# Patient Record
Sex: Male | Born: 1980
Health system: Southern US, Community
[De-identification: ages and names within clinical notes are randomized; demographics above are authoritative.]

## PROBLEM LIST (undated history)

## (undated) ENCOUNTER — Ambulatory Visit: Source: Home / Self Care

## (undated) DIAGNOSIS — Z87442 Personal history of urinary calculi: Secondary | ICD-10-CM

## (undated) DIAGNOSIS — F32A Depression, unspecified: Secondary | ICD-10-CM

## (undated) DIAGNOSIS — G894 Chronic pain syndrome: Secondary | ICD-10-CM

## (undated) DIAGNOSIS — N390 Urinary tract infection, site not specified: Secondary | ICD-10-CM

## (undated) DIAGNOSIS — I10 Essential (primary) hypertension: Secondary | ICD-10-CM

## (undated) DIAGNOSIS — F419 Anxiety disorder, unspecified: Secondary | ICD-10-CM

## (undated) DIAGNOSIS — F329 Major depressive disorder, single episode, unspecified: Secondary | ICD-10-CM

## (undated) DIAGNOSIS — N189 Chronic kidney disease, unspecified: Secondary | ICD-10-CM

## (undated) DIAGNOSIS — K219 Gastro-esophageal reflux disease without esophagitis: Secondary | ICD-10-CM

## (undated) DIAGNOSIS — M1990 Unspecified osteoarthritis, unspecified site: Secondary | ICD-10-CM

## (undated) DIAGNOSIS — J45909 Unspecified asthma, uncomplicated: Secondary | ICD-10-CM

## (undated) DIAGNOSIS — G4730 Sleep apnea, unspecified: Secondary | ICD-10-CM

## (undated) DIAGNOSIS — R7303 Prediabetes: Secondary | ICD-10-CM

## (undated) HISTORY — PX: REPLACEMENT TOTAL KNEE: SUR1224

## (undated) HISTORY — DX: Urinary tract infection, site not specified: N39.0

## (undated) HISTORY — DX: Sleep apnea, unspecified: G47.30

## (undated) HISTORY — PX: KNEE SURGERY: SHX244

## (undated) HISTORY — PX: COLON SURGERY: SHX602

## (undated) HISTORY — DX: Gastro-esophageal reflux disease without esophagitis: K21.9

## (undated) HISTORY — PX: JOINT REPLACEMENT: SHX530

---

## 1999-01-12 ENCOUNTER — Encounter: Payer: Self-pay | Admitting: Emergency Medicine

## 1999-01-12 ENCOUNTER — Emergency Department (HOSPITAL_COMMUNITY): Admission: EM | Admit: 1999-01-12 | Discharge: 1999-01-12 | Payer: Self-pay | Admitting: Emergency Medicine

## 2000-11-29 ENCOUNTER — Emergency Department (HOSPITAL_COMMUNITY): Admission: EM | Admit: 2000-11-29 | Discharge: 2000-11-29 | Payer: Self-pay | Admitting: Internal Medicine

## 2000-12-14 ENCOUNTER — Ambulatory Visit (HOSPITAL_BASED_OUTPATIENT_CLINIC_OR_DEPARTMENT_OTHER): Admission: RE | Admit: 2000-12-14 | Discharge: 2000-12-15 | Payer: Self-pay | Admitting: Orthopedic Surgery

## 2002-03-12 ENCOUNTER — Emergency Department (HOSPITAL_COMMUNITY): Admission: EM | Admit: 2002-03-12 | Discharge: 2002-03-12 | Payer: Self-pay

## 2002-07-26 ENCOUNTER — Emergency Department (HOSPITAL_COMMUNITY): Admission: EM | Admit: 2002-07-26 | Discharge: 2002-07-26 | Payer: Self-pay | Admitting: *Deleted

## 2002-12-10 ENCOUNTER — Emergency Department (HOSPITAL_COMMUNITY): Admission: EM | Admit: 2002-12-10 | Discharge: 2002-12-10 | Payer: Self-pay | Admitting: Emergency Medicine

## 2004-03-10 ENCOUNTER — Emergency Department (HOSPITAL_COMMUNITY): Admission: EM | Admit: 2004-03-10 | Discharge: 2004-03-10 | Payer: Self-pay | Admitting: Emergency Medicine

## 2004-06-18 ENCOUNTER — Emergency Department (HOSPITAL_COMMUNITY): Admission: EM | Admit: 2004-06-18 | Discharge: 2004-06-18 | Payer: Self-pay | Admitting: Emergency Medicine

## 2005-04-08 ENCOUNTER — Ambulatory Visit (HOSPITAL_COMMUNITY): Admission: RE | Admit: 2005-04-08 | Discharge: 2005-04-08 | Payer: Self-pay | Admitting: Orthopedic Surgery

## 2005-09-14 ENCOUNTER — Ambulatory Visit (HOSPITAL_COMMUNITY): Admission: RE | Admit: 2005-09-14 | Discharge: 2005-09-14 | Payer: Self-pay | Admitting: Orthopedic Surgery

## 2005-10-15 ENCOUNTER — Encounter: Admission: RE | Admit: 2005-10-15 | Discharge: 2005-10-15 | Payer: Self-pay | Admitting: Orthopedic Surgery

## 2007-09-19 ENCOUNTER — Emergency Department (HOSPITAL_COMMUNITY): Admission: EM | Admit: 2007-09-19 | Discharge: 2007-09-19 | Payer: Self-pay | Admitting: Emergency Medicine

## 2008-04-08 ENCOUNTER — Emergency Department (HOSPITAL_COMMUNITY): Admission: EM | Admit: 2008-04-08 | Discharge: 2008-04-08 | Payer: Self-pay | Admitting: Emergency Medicine

## 2009-03-01 ENCOUNTER — Emergency Department (HOSPITAL_COMMUNITY): Admission: EM | Admit: 2009-03-01 | Discharge: 2009-03-01 | Payer: Self-pay | Admitting: Emergency Medicine

## 2009-09-13 ENCOUNTER — Emergency Department (HOSPITAL_COMMUNITY): Admission: EM | Admit: 2009-09-13 | Discharge: 2009-09-13 | Payer: Self-pay | Admitting: Emergency Medicine

## 2009-11-24 ENCOUNTER — Encounter (INDEPENDENT_AMBULATORY_CARE_PROVIDER_SITE_OTHER): Payer: Self-pay | Admitting: *Deleted

## 2009-12-17 ENCOUNTER — Ambulatory Visit: Payer: Self-pay | Admitting: Family

## 2010-01-21 ENCOUNTER — Ambulatory Visit: Payer: Self-pay | Admitting: Family

## 2010-01-21 DIAGNOSIS — M1990 Unspecified osteoarthritis, unspecified site: Secondary | ICD-10-CM | POA: Insufficient documentation

## 2010-01-21 DIAGNOSIS — F419 Anxiety disorder, unspecified: Secondary | ICD-10-CM | POA: Insufficient documentation

## 2010-01-21 DIAGNOSIS — I10 Essential (primary) hypertension: Secondary | ICD-10-CM | POA: Insufficient documentation

## 2010-01-21 DIAGNOSIS — F329 Major depressive disorder, single episode, unspecified: Secondary | ICD-10-CM | POA: Insufficient documentation

## 2010-01-21 DIAGNOSIS — G894 Chronic pain syndrome: Secondary | ICD-10-CM | POA: Insufficient documentation

## 2010-02-03 ENCOUNTER — Ambulatory Visit: Payer: Self-pay | Admitting: Family

## 2010-02-03 LAB — CONVERTED CEMR LAB
ALT: 29 units/L (ref 0–53)
AST: 33 units/L (ref 0–37)
Alkaline Phosphatase: 79 units/L (ref 39–117)
Basophils Relative: 0.4 % (ref 0.0–3.0)
Bilirubin, Direct: 0 mg/dL (ref 0.0–0.3)
CO2: 30 meq/L (ref 19–32)
Calcium: 9.3 mg/dL (ref 8.4–10.5)
Chloride: 108 meq/L (ref 96–112)
Cholesterol: 174 mg/dL (ref 0–200)
Eosinophils Absolute: 0.2 10*3/uL (ref 0.0–0.7)
HCT: 41.7 % (ref 39.0–52.0)
HDL: 53.1 mg/dL (ref >39.00)
Hemoglobin: 13.2 g/dL (ref 13.0–17.0)
Lymphocytes Relative: 49.7 % — ABNORMAL HIGH (ref 12.0–46.0)
MCHC: 31.7 g/dL (ref 30.0–36.0)
MCV: 84.5 fL (ref 78.0–100.0)
Monocytes Absolute: 0.6 10*3/uL (ref 0.1–1.0)
Neutrophils Relative %: 38 % — ABNORMAL LOW (ref 43.0–77.0)
RDW: 13.7 % (ref 11.5–14.6)
Sodium: 142 meq/L (ref 135–145)
Total Bilirubin: 0.4 mg/dL (ref 0.3–1.2)
Total CHOL/HDL Ratio: 3
WBC: 6.5 10*3/uL (ref 4.5–10.5)

## 2010-02-04 ENCOUNTER — Encounter: Payer: Self-pay | Admitting: Family

## 2010-02-09 ENCOUNTER — Telehealth: Payer: Self-pay | Admitting: Family

## 2010-02-10 ENCOUNTER — Telehealth: Payer: Self-pay | Admitting: Family

## 2010-02-27 ENCOUNTER — Encounter
Admission: RE | Admit: 2010-02-27 | Discharge: 2010-05-28 | Payer: Self-pay | Admitting: Physical Medicine & Rehabilitation

## 2010-03-03 ENCOUNTER — Ambulatory Visit: Payer: Self-pay | Admitting: Physical Medicine & Rehabilitation

## 2010-03-17 ENCOUNTER — Telehealth: Payer: Self-pay | Admitting: Family

## 2010-03-20 ENCOUNTER — Ambulatory Visit: Payer: Self-pay | Admitting: Physical Medicine & Rehabilitation

## 2010-04-21 ENCOUNTER — Ambulatory Visit: Payer: Self-pay | Admitting: Physical Medicine & Rehabilitation

## 2010-05-19 ENCOUNTER — Encounter
Admission: RE | Admit: 2010-05-19 | Discharge: 2010-08-17 | Payer: Self-pay | Admitting: Physical Medicine & Rehabilitation

## 2010-05-22 ENCOUNTER — Ambulatory Visit: Payer: Self-pay | Admitting: Physical Medicine & Rehabilitation

## 2010-06-16 ENCOUNTER — Ambulatory Visit: Payer: Self-pay | Admitting: Physical Medicine & Rehabilitation

## 2010-06-18 ENCOUNTER — Ambulatory Visit (HOSPITAL_COMMUNITY): Admission: RE | Admit: 2010-06-18 | Discharge: 2010-06-18 | Payer: Self-pay | Admitting: Orthopedic Surgery

## 2010-06-23 ENCOUNTER — Ambulatory Visit: Payer: Self-pay | Admitting: Physical Medicine & Rehabilitation

## 2010-06-30 ENCOUNTER — Ambulatory Visit: Payer: Self-pay | Admitting: Physical Medicine & Rehabilitation

## 2010-07-13 ENCOUNTER — Ambulatory Visit: Payer: Self-pay | Admitting: Physical Medicine & Rehabilitation

## 2010-07-20 ENCOUNTER — Ambulatory Visit: Payer: Self-pay | Admitting: Physical Medicine & Rehabilitation

## 2010-08-12 ENCOUNTER — Encounter
Admission: RE | Admit: 2010-08-12 | Discharge: 2010-11-10 | Payer: Self-pay | Admitting: Physical Medicine & Rehabilitation

## 2010-08-20 ENCOUNTER — Ambulatory Visit: Payer: Self-pay | Admitting: Physical Medicine & Rehabilitation

## 2010-09-18 ENCOUNTER — Ambulatory Visit: Payer: Self-pay | Admitting: Physical Medicine & Rehabilitation

## 2010-10-20 ENCOUNTER — Ambulatory Visit: Payer: Self-pay | Admitting: Physical Medicine & Rehabilitation

## 2010-12-09 ENCOUNTER — Encounter
Admission: RE | Admit: 2010-12-09 | Discharge: 2010-12-15 | Payer: Self-pay | Source: Home / Self Care | Attending: Physical Medicine & Rehabilitation | Admitting: Physical Medicine & Rehabilitation

## 2010-12-15 ENCOUNTER — Ambulatory Visit: Payer: Self-pay | Admitting: Physical Medicine & Rehabilitation

## 2011-01-28 NOTE — Assessment & Plan Note (Signed)
Summary: NEW PT TO ESTAB///SPH   Vital Signs:  Patient profile:   30 year old male Height:      66.25 inches Weight:      250.6 pounds BMI:     40.29 Pulse rate:   78 / minute BP sitting:   136 / 80  (left arm)  Vitals Entered By: Doristine Devoid (January 21, 2010 10:13 AM) CC: NEW EST- needs refill on meds until he gets in w/ pain management Dr.    CC:  NEW EST- needs refill on meds until he gets in w/ pain management Dr. .  History of Present Illness: Mr Branch is a 30 year old male who presents to establish care today.  Notes that he has not had a primary care provider since he was a child.    OA- had Fork lift injury with ACL demage.  Has had multiple orthopedic procedures and PT.  Still has severe pain despite TKR in March 2010.    Obesity- weighed 308 at one time.    Depression- this is new for patient since his injury. Has struggled with his change in lifestyle and inability to work.    HTN- not currently treated  Preventative- Up date on Tetanus and Flu.  Exercises regularly.  Notes that he has lost over 50 lbs in 2006. Notes diet consists of fresh fruits/vegetables- eats small portion.     Allergies (verified): 1)  ! Thalia Party  Past History:  Past Medical History: Osteoarthritis Depression  Past Surgical History: ACL repair right knee 2001- [facility] ACL repair right knee 3/06 Dr. Brynda Greathouse,  [facility] ACL revision right knee 10/06 Dr Brynda Greathouse Femur oscotomy 9/07 Dr. Annamaria Helling at [facility]  Removal of Hardware Dr [facility] 3/10 Right TKR 04/29/09  Past History:  Care Management: Orthopedics:Dr. Everardo Beals- [facility]  Family History: CAD-father deceased MI age 43 paternal grandfather deceased MI age 32 HTN-father DM-father COLON CA-no PROSTATE CA-paternal uncle STROKE-paternal grandmother deceased 83 Family History of Alcoholism/Addiction  Mom- Depression Dad- MI at age 26, hyperlipidemia, HTN, DM, angina 2 sisters- both healthy 1 brother-  healthy  Social History: quit smoking 10 years ago Denies ETOH married 1 son age 41 + history of marijuana use- none currently completed 2 years of college unemployed due to injury, currently on disability  Review of Systems       Constitutional: Denies Fever ENT:  Denies nasal congestion or sore throat. Resp: Denies cough CV:  Denies Chest Pain or DOE GI:  Denies nausea or vomitting GU: Denies dysuria Lymphatic: Denies lymphadenopathy Musculoskeletal:  chronic right knee pain Skin:  Denies Rashes Psychiatric: + depression notes lack of interest in things that he used to enjoy.  Large crowds of people/driving make him nervous. +panic attacks frequently when in public places.  Denies suicide ideation Neuro: notes + numbness in toes since injury,  arms fall asleep at night.       Physical Exam  General:  Overweight AA male in NAD Head:  [place]/AT, some male pattern baldness is noted Eyes:  No corneal or conjunctival inflammation noted. EOMI. Perrla. Funduscopic exam benign, without hemorrhages, exudates or papilledema. Vision grossly normal. Ears:  External ear exam shows no significant lesions or deformities.  Otoscopic examination reveals clear canals, tympanic membranes are intact bilaterally without bulging, retraction, inflammation or discharge. Hearing is grossly normal bilaterally. Mouth:  Oral mucosa and oropharynx without lesions or exudates.  Teeth in good repair. Neck:  No deformities, masses, or tenderness noted. Lungs:  Normal respiratory  effort, chest expands symmetrically. Lungs are clear to auscultation, no crackles or wheezes. Heart:  Normal rate and regular rhythm. S1 and S2 normal without gallop, murmur, click, rub or other extra sounds. Abdomen:  Bowel sounds positive,abdomen soft and non-tender without masses, organomegaly or hernias noted. Msk:  + tenderness with mild swelling of right knee.   Extremities:  No clubbing, cyanosis Neurologic:  decreased strenth  RLE due to knee pain alert & oriented X3.   walks with walker Skin:  Intact without suspicious lesions or rashes Cervical Nodes:  No lymphadenopathy noted Psych:  Cognition and judgment appear intact. Alert and cooperative with normal attention span and concentration. No apparent delusions, illusions, hallucinations   Impression & Recommendations:  Problem # 1:  Preventive Health Care (ICD-V70.0) Will plan to have patient return for fasting blood work.  Immunizations reviewed, patient counselled on diet/exercise and weight loss.  Problem # 2:  DEPRESSION (ICD-311) has tried cymbalta felt "trapped inside myself" Notes that he uses valium for muscle spasms and trouble sleeping.  He was seeing Dr. Vear Clock but was discharged due to marijuana use.  He declines medications at this time.  His updated medication list for this problem includes:    Valium 5 Mg Tabs (Diazepam) .Marland Kitchen... Take one q 6 hours daily  Problem # 3:  HYPERTENSION, MILD (ICD-401.1) Assessment: New  Advised patient on low sodium diet.  Plan to repeat BP next visit and will make determination of need for BP med at that time.    BP today: 136/80  Problem # 4:  CORONARY ARTERY DISEASE, PREMATURE, FAMILY HX (ICD-V17.3) Will plan to check FLP- needs baseline EKG next visit as well.  Will plan to aim for LDL less than 100 given patient's family hx of dad dying at 79 of MI.    Problem # 5:  CHRONIC PAIN SYNDROME (ICD-338.4) Assessment: Unchanged Patient is in the process of trying to get into a new pain management practice.  Tells me that his insurance has declined to authorize visits to the provider to whom he has been referred.  He is in the process of appealing.  Orthopedist is going to cover pain management until mid February.  Patient asks me if I can manage pain temporarily if he is not yet in with pain management in Mid- February.  I told him that I would do this only on a temporary basis and that this is best managed by a  pain specialist.  He verbalized understanding.    Problem # 6:  OSTEOARTHRITIS (ICD-715.90) Assessment: Deteriorated Management per orthopedics.   His updated medication list for this problem includes:    Oxycodone-acetaminophen 10-325 Mg Tabs (Oxycodone-acetaminophen) .Marland Kitchen... Take one tablet q 4 hours  Complete Medication List: 1)  Limbrel 500 Mg Caps (Flavocoxid) .... Take two times a day 2)  Valium 5 Mg Tabs (Diazepam) .... Take one q 6 hours daily 3)  Metanx 3-35-2 Mg Tabs (L-methylfolate-b6-b12) .... Two times a day 4)  Oxycodone-acetaminophen 10-325 Mg Tabs (Oxycodone-acetaminophen) .... Take one tablet q 4 hours 5)  Flexeril  .... Dose unknown takes daily  Patient Instructions: 1)  Please return fasting for the following blood tests- 2)  CBC, LFT's,FLP  TSH, BMET- V70 3)  Please schedule a follow-up appointment in 1 month. 4)  You need to lose weight. Consider a lower calorie diet and regular exercise.  5)  Limit your Sodium (Salt).

## 2011-01-28 NOTE — Progress Notes (Signed)
Summary: PAIN CLINIC REFERRAL  Phone Note Other Incoming   Summary of Call: In reference to 02-09-2010 Pain clinic referral, last phone notes states patient has already been referred to one.  (We show no insurance in our system). I called patient to clarify, and they state he does have Medicare insurance.  Also they state he has already been referred to [facility], and are waiting on response to see if they will accept him as a patient.  They also inform me that he has hx w/[facility].  All pain clinics require previous records for review, and I do not see that we have any.  Do we just wait then on the response from pain clinic he is already referred to?  Initial call taken by: Magdalen Spatz [facility],  February 10, 2010 12:09 PM  Follow-up for Phone Call        It is my understanding that he has been discharged from [facility] and current pain management referral has declined him.  Please verify this with patient. If he has in fact been declined, then could we please try another pain management please. Thanks. Follow-up by: Lemont Fillers FNP,  February 10, 2010 12:14 PM  Additional Follow-up for Phone Call Additional follow up Details #1::        I just spoke with his wife shortly ago, and states they are awaiting response from [facility] to see if patient will be accepted.    Magdalen Spatz [facility]  February 10, 2010 12:51 PM

## 2011-01-28 NOTE — Letter (Signed)
   [facility] [address] [place], [place] [address] [phone]    February 04, 2010   [facility] [address] [place], [place] [address]  RE:  LAB RESULTS  Dear  Mr. Dowlen,  The following is an interpretation of your most recent lab tests.  Please take note of any instructions provided or changes to medications that have resulted from your lab work.  ELECTROLYTES:  Good - no changes needed  KIDNEY FUNCTION TESTS:  Good - no changes needed  LIPID PANEL:  Good - no changes needed Triglyceride: 69.0   Cholesterol: 174   LDL: 107   HDL: 53.10   Chol/HDL%:  3  THYROID STUDIES:  Thyroid studies normal TSH: 3.91     DIABETIC STUDIES:  Excellent - no changes needed Blood Glucose: 79    CBC:  Good - no changes needed   Sincerely Yours,    Lemont Fillers FNP

## 2011-01-28 NOTE — Progress Notes (Signed)
Summary: Re; Pain Med  Phone Note Call from Patient Call back at Home Phone [phone]   Caller: Spouse Summary of Call: wife called says husband is having a adverse reaction to med [employer]  (says  brand new med),  ie; sob,dizziness,  jumpy vision. Wife says they did let Dr Fritzi Mandes nurse know,  she did recommend him to take patch off and she called in some hydrocodone which is not helping with pain.  wife says she feels pain Doc not doing his job and   wants to speak with Jordan [name] who she says  was told if  he has any problems to give a call Initial call taken by: Kandice Hams,  March 17, 2010 12:29 PM  Follow-up for Phone Call        Called patient.  He tells me he saw Dr Larna Daughters once in early march.  Was placed on patch (butran) Had allergic reaction.  Was switched to hydrocodone.  Feels like his pain medicine has been dropped too quickly.  I told pt that I am unable to prescribe narcotics given that he is being seen by pain management.  I encouraged patient to communicate his concerns with Dr Trecia Rogers nurse and try to arrange earlier follow up.  Encouraged pt to give it some more time as he has only seen Dr Larna Daughters once.  If he continues to be unsatisfied down the road, we can consider referral to a different pain management center, however I instructed pt to continue to work with Dr. Larna Daughters regardless until we have him scheduled to see another provider as I will not be providing his pain management. Pt verbalizes understanding. Follow-up by: Lemont Fillers FNP,  March 17, 2010 1:40 PM   New Allergies: ! BUTRANS (BUPRENORPHINE) New Allergies: ! BUTRANS (BUPRENORPHINE)

## 2011-01-28 NOTE — Progress Notes (Signed)
  Phone Note Outgoing Call   Call placed by: Lemont Fillers FNP,  February 09, 2010 9:38 AM Summary of Call: Called patient, he tells me his insurance has still not approved current pain clinic referral.  Will have our referral coordinator try to assist with a different pain clinic.  Patient aware.   Initial call taken by: Lemont Fillers FNP,  February 09, 2010 9:39 AM

## 2011-03-19 ENCOUNTER — Encounter: Payer: Worker's Compensation | Attending: Physical Medicine & Rehabilitation

## 2011-03-19 ENCOUNTER — Ambulatory Visit: Payer: Worker's Compensation | Admitting: Physical Medicine & Rehabilitation

## 2011-03-19 DIAGNOSIS — G8928 Other chronic postprocedural pain: Secondary | ICD-10-CM

## 2011-03-19 DIAGNOSIS — M25579 Pain in unspecified ankle and joints of unspecified foot: Secondary | ICD-10-CM | POA: Insufficient documentation

## 2011-03-19 DIAGNOSIS — M25569 Pain in unspecified knee: Secondary | ICD-10-CM | POA: Insufficient documentation

## 2011-03-19 DIAGNOSIS — R209 Unspecified disturbances of skin sensation: Secondary | ICD-10-CM | POA: Insufficient documentation

## 2011-03-19 DIAGNOSIS — F341 Dysthymic disorder: Secondary | ICD-10-CM | POA: Insufficient documentation

## 2011-03-19 DIAGNOSIS — Z79899 Other long term (current) drug therapy: Secondary | ICD-10-CM | POA: Insufficient documentation

## 2011-03-19 DIAGNOSIS — M62838 Other muscle spasm: Secondary | ICD-10-CM | POA: Insufficient documentation

## 2011-03-19 DIAGNOSIS — K5900 Constipation, unspecified: Secondary | ICD-10-CM | POA: Insufficient documentation

## 2011-04-02 LAB — CBC
Hemoglobin: 15 g/dL (ref 13.0–17.0)
MCHC: 33.1 g/dL (ref 30.0–36.0)
RDW: 15.5 % (ref 11.5–15.5)
WBC: 10 10*3/uL (ref 4.0–10.5)

## 2011-04-02 LAB — DIFFERENTIAL
Lymphocytes Relative: 22 % (ref 12–46)
Lymphs Abs: 2.2 10*3/uL (ref 0.7–4.0)
Monocytes Relative: 6 % (ref 3–12)
Neutro Abs: 7 10*3/uL (ref 1.7–7.7)
Neutrophils Relative %: 70 % (ref 43–77)

## 2011-04-02 LAB — COMPREHENSIVE METABOLIC PANEL
ALT: 27 U/L (ref 0–53)
BUN: 4 mg/dL — ABNORMAL LOW (ref 6–23)
CO2: 24 mEq/L (ref 19–32)
Calcium: 9.9 mg/dL (ref 8.4–10.5)
Creatinine, Ser: 0.86 mg/dL (ref 0.4–1.5)
GFR calc non Af Amer: >60 mL/min (ref >60)
Glucose, Bld: 102 mg/dL — ABNORMAL HIGH (ref 70–99)
Sodium: 141 mEq/L (ref 135–145)
Total Protein: 8 g/dL (ref 6.0–8.3)

## 2011-04-02 LAB — LIPASE, BLOOD: Lipase: 25 U/L (ref 11–59)

## 2011-05-14 NOTE — Op Note (Signed)
[place]  Patient:    Jordan [name], Jordan [name]                 MRN: [id number] Proc. Date: 12/14/00 Adm. Date:  [id number] Attending:  Alinda Deem                           Operative Report  PREOPERATIVE DIAGNOSIS:  Right knee medial meniscal tear with anterior cruciate ligament tear.  POSTOPERATIVE DIAGNOSIS:  Right knee medial meniscal tear with anterior cruciate ligament tear.  PROCEDURE:  Right knee partial arthroscopic medial meniscectomy and anterior cruciate ligament reconstruction using a 10 mm wide bone-tendon-bone autograft, with an 8 x 20 Linvatek BioScrew on the femur and a 9 x 20 Linvatek BioScrew on the tibia.  SURGEON:  Alinda Deem, M.D.  FIRST ASSISTANT:  Dorthula Matas, P.A.-C.  ANESTHESIA:  General endotracheal.  ESTIMATED BLOOD LOSS:  Minimal.  FLUID REPLACEMENT:  1 L crystalloid.  DRAINS PLACED:  None.  TOURNIQUET TIME:  None.  PERIOPERATIVE ANTIBIOTICS:  500 mg of vancomycin.  INDICATIONS FOR PROCEDURE:  A 30 year old man who presented to my office about a week ago with an acute injury to his right knee, which is most likely either a bucket-handle or a parrot-beak tear of the medial meniscus.  It was preventing full range of motion and preventing him from working properly.  He put up with these symptoms for a few weeks and because of pain and disability, he came in for evaluation.  We also noted that he had an ACL tear that may have been chronic in nature, relating to an injury that occurred a few years ago.  Because of the chronic instability he has had for the last couple of years and the need to repair or remove the torn meniscus, arthroscopic evaluation and treatment of these two injuries was recommended and consented to by the patient.  DESCRIPTION OF PROCEDURE:  The patient identified by arm band and taken to the operating room at [facility], appropriate anesthetic  monitors were attached, and general endotracheal anesthesia induced with the patient in the supine position.  The right lower extremity was then prepped and draped in the usual sterile fashion from the ankle to the mid-thigh.  Lateral post applied to the table as well as the Stulberg Mark II knee positioning device. The inferolateral and inferomedial peripatellar regions were then infiltrated with 2-3 cc of 0.5% Marcaine and epinephrine solution.  The arthroscope was introduced through the inferolateral portal with the outflow through the inferomedial portal.  Diagnostic arthroscopy revealed the patella, trochlea, and retinacular tissues to be in good condition.  On the medial side, the patient had a complex parrot-beak/bucket-handle tear of the medial meniscus, which was removed with a 4.2 mm barracuda sucker-shaver as well as the grasping biters.  Moving into the notch, the ACL was found to be torn off of the femur.  This appeared to be a somewhat chronic injury, and the stump was debrided and a superior lateral notchplasty of about 1 mm was performed.  The lateral meniscus and lateral articular cartilages were pristine.  Satisfied with the stump removal and the notchplasty, the knee was held in 45 degrees of flexion and an anterior incision starting a centimeter medial to the tibial tubercle was made to the region of the midpatella through the skin and subcutaneous tissue and cutting through the retinaculum over the patellar tendon, which was measured  to 30 mm.  A standard bone-tendon-bone autograft was then harvested with 10 mm wide bone plugs, each bone plug about 20 mm in length.  This was taken to the back table and prepared.  A single 20-gauge wire was passed through the tibial bone plug for traction once the graft was inserted.  The arthroscope was reinserted in the knee and using the Linvatek tibial pin guide set at 55 degrees, the guide pin was placed 1.5 cm medial to the tibial  tubercle up to the posterior aspect of the ACL footprint on the tibia.  This was over-reamed with a 9 mm reamer.  The 7 mm over-the-top guide was placed through the tibial tunnel, and a guide pin was placed on the femur. This guide pin was over-reamed with the 9 mm reamer as well to a depth of 35 mm, and then a superior notch placed in the femoral tunnel and a lateral notch in the tibial tunnel.  The graft, which was prepared at the back table, was then threaded through the tibial tunnel and into the femoral tunnel with a good fit.  The knee was flexed to 95 degrees, and an 8 x 20 BioScrew was used to lock in the patellar bone plug in the femoral tunnel with a good squeak obtained.  The knee was taken through a range of motion, no impingement noted. A 90 degree twist was placed in the graft, which was then tensioned and the knee flexed at 45 degrees with a reverse Lachman maneuver, and the tibial bone plug was locked into the tibial tunnel using a 9 x 20 Linvatek BioScrew.  The knee was taken through a range of motion.  The graft was assessed for tension, which was appropriate.  No impingement was noted.  Lachman test was negative under arthroscopic visualization.  The knee was then washed out with normal saline solution, the arthroscopic instruments removed.  Small bits of bone that were taken from the autograft were placed in the trough in the patella and the tibia.  The patellar retinaculum was closed with running 3-0 Vicryl suture, as was the subcutaneous tissue.  The skin was closed with running subcuticular 4-0 Monocryl suture.  A dressing of Xeroform, followed by a dressing sponge, Webril, and Ace wrap, and a Cryocuff was placed in the operating room.  The patient was awakened and taken to the recovery room and placed in the CPM machine with overnight observation. DD:  12/14/00 TD:  12/15/00 Job: [id number]

## 2011-06-10 ENCOUNTER — Encounter: Payer: Worker's Compensation | Admitting: Neurosurgery

## 2011-06-10 DIAGNOSIS — F341 Dysthymic disorder: Secondary | ICD-10-CM | POA: Insufficient documentation

## 2011-06-10 DIAGNOSIS — M25569 Pain in unspecified knee: Secondary | ICD-10-CM | POA: Insufficient documentation

## 2011-06-10 DIAGNOSIS — Z79899 Other long term (current) drug therapy: Secondary | ICD-10-CM | POA: Insufficient documentation

## 2011-06-10 DIAGNOSIS — M25579 Pain in unspecified ankle and joints of unspecified foot: Secondary | ICD-10-CM | POA: Insufficient documentation

## 2011-06-10 DIAGNOSIS — M62838 Other muscle spasm: Secondary | ICD-10-CM | POA: Insufficient documentation

## 2011-06-10 DIAGNOSIS — R209 Unspecified disturbances of skin sensation: Secondary | ICD-10-CM | POA: Insufficient documentation

## 2011-06-10 DIAGNOSIS — K5900 Constipation, unspecified: Secondary | ICD-10-CM | POA: Insufficient documentation

## 2011-07-13 ENCOUNTER — Encounter: Payer: Worker's Compensation | Attending: Neurosurgery | Admitting: Neurosurgery

## 2011-07-13 DIAGNOSIS — M25579 Pain in unspecified ankle and joints of unspecified foot: Secondary | ICD-10-CM | POA: Insufficient documentation

## 2011-07-13 DIAGNOSIS — G8928 Other chronic postprocedural pain: Secondary | ICD-10-CM | POA: Insufficient documentation

## 2011-07-13 DIAGNOSIS — G8921 Chronic pain due to trauma: Secondary | ICD-10-CM | POA: Insufficient documentation

## 2011-07-13 DIAGNOSIS — M25569 Pain in unspecified knee: Secondary | ICD-10-CM

## 2011-07-14 NOTE — Assessment & Plan Note (Signed)
HISTORY OF PRESENT ILLNESS:  This is a patient of Dr. Wynn [name] is seen for chronic right knee and ankle pain.  He had a workers' comp injury of Biochemist, clinical followed by right knee arthroplasty at [facility].  He states he has had problems with pain ever since and multiple surgeries.  He rates his pain at 8-9.  It is a sharp pain that is constant with some paresthesia like activity.  General activity level is 8-9.  Pain is same 24 hours a day.  It is worse with walking, bending, standing, and activity.  Rest, heat, and medication tend to help.  He uses a cane or walker.  He has a rolling walker with him today.  He does drive.  He does not climb steps.  He can walk about 5 minutes.  REVIEW OF SYSTEMS:  Notable for those difficulties described above, otherwise within normal limits.  PAST MEDICAL HISTORY:  Unchanged.  He does see Dr. Everardo [name], orthopedist at [facility].  He did not see Dr. Darrick [name] that Dr. Wynn [name] referred him to.  He states that he will follow up with his doctor at [facility].  SOCIAL HISTORY:  Unchanged.  FAMILY HISTORY:  Unchanged.  PHYSICAL EXAMINATION:  VITAL SIGNS:  His blood pressure 155/84, pulse 88, respirations 20, and O2 sats 99 on room air. NEUROLOGIC:  His motor strength is about 4/5 in lower extremities. Sensation is intact.  Constitutionally, he is obese.  He is alert and oriented x3.  He is again using a rolling walker today.  IMPRESSION:  Chronic pain, right knee and ankle, post-injury, post- surgery.  PLAN:  He will continue his current medication regimen.  Somehow, he has a refill on his hydrocodone.  He will follow up here in the clinic in 1 month.  He has refills on his other medicines without change.  His questions were encouraged and answered.     Jordan [name] L. [name] Electronically Signed    RLW/MedQ D:  07/13/2011 11:40:17  T:  07/14/2011 00:58:56  Job #:  [id number]

## 2011-10-08 ENCOUNTER — Ambulatory Visit: Payer: Worker's Compensation | Admitting: Physical Medicine & Rehabilitation

## 2011-10-08 ENCOUNTER — Encounter: Payer: Worker's Compensation | Attending: Neurosurgery

## 2011-10-08 DIAGNOSIS — M25579 Pain in unspecified ankle and joints of unspecified foot: Secondary | ICD-10-CM | POA: Insufficient documentation

## 2011-10-08 DIAGNOSIS — G8921 Chronic pain due to trauma: Secondary | ICD-10-CM | POA: Insufficient documentation

## 2011-10-08 DIAGNOSIS — G8928 Other chronic postprocedural pain: Secondary | ICD-10-CM

## 2011-10-08 DIAGNOSIS — M25569 Pain in unspecified knee: Secondary | ICD-10-CM | POA: Insufficient documentation

## 2011-10-08 DIAGNOSIS — G894 Chronic pain syndrome: Secondary | ICD-10-CM

## 2011-10-08 NOTE — Assessment & Plan Note (Signed)
This is a patient of Dr. Wynn Banker seen for chronic right knee pain and ankle pain.  He has an old ACL work comp injury and he is treated at [facility].  The patient states his pain is about 8 or 9.  It is a sharp, burning, dull, stabbing, tingling, aching pain.  It is constant. General activity level is 9-10.  Pain is same 24 hours a day.  Sleep patterns are poor.  All activities aggravate.  Rest, heat, medication tend to help.  He walks with a cane or walker.  He does drive.  He does not climb steps.  He can only walk about 5 minutes at a time.  At times he uses a wheelchair.  He is on disability.  REVIEW OF SYSTEMS:  Notable for difficulties described above as well as some weakness, numbness, tingling, trouble walking, spasms, dizziness, depression, anxiety.  No suicidal thoughts or aberrant behaviors.  Pill counts were correct.  UDS was collected today.  PAST MEDICAL HISTORY/SOCIAL HISTORY/FAMILY HISTORY:  Unchanged.  PHYSICAL EXAMINATION:  Blood pressure 170/85, pulse 89, respirations 16, O2 sats 100 on room air.  His motor strength is good in his lower extremities.  His sensation is intact.  Constitutionally, he is mildly obese.  He is alert and oriented x3.  He walks with a cane.  He has stated that he would like come down on his hydrocodone and keep weaning himself down in dose.  I think it is admirable.  IMPRESSION:  Chronic pain, right knee and ankle post surgery.  PLAN:  Refill hydrocodone 7.5/325, 1 p.o. t.i.d. to q.i.d.  He is to keep at 120 for now.  No refill.  We will see him back in a month.     Jahzeel Poythress L. [name] Electronically Signed    RLW/MedQ D:  10/08/2011 14:01:02  T:  10/08/2011 19:39:09  Job #:  [id number]

## 2011-10-15 ENCOUNTER — Ambulatory Visit: Payer: Self-pay | Admitting: Physical Medicine & Rehabilitation

## 2011-12-31 ENCOUNTER — Ambulatory Visit: Payer: Self-pay | Admitting: Neurosurgery

## 2012-02-25 ENCOUNTER — Encounter: Payer: Self-pay | Admitting: Family

## 2012-02-25 ENCOUNTER — Ambulatory Visit (INDEPENDENT_AMBULATORY_CARE_PROVIDER_SITE_OTHER): Payer: Worker's Compensation | Admitting: Family

## 2012-02-25 DIAGNOSIS — Z0000 Encounter for general adult medical examination without abnormal findings: Secondary | ICD-10-CM

## 2012-02-25 DIAGNOSIS — F341 Dysthymic disorder: Secondary | ICD-10-CM

## 2012-02-25 DIAGNOSIS — F419 Anxiety disorder, unspecified: Secondary | ICD-10-CM

## 2012-02-25 DIAGNOSIS — G894 Chronic pain syndrome: Secondary | ICD-10-CM

## 2012-02-25 DIAGNOSIS — I10 Essential (primary) hypertension: Secondary | ICD-10-CM

## 2012-02-25 DIAGNOSIS — F329 Major depressive disorder, single episode, unspecified: Secondary | ICD-10-CM

## 2012-02-25 MED ORDER — L-METHYLFOLATE-B6-B12 2.8-25-2 MG PO TABS: 1.0000 | ORAL_TABLET | Freq: Two times a day (BID) | ORAL | Status: DC

## 2012-02-25 MED ORDER — FLAVOCOXID 500 MG PO CAPS: 500.0000 mg | ORAL_CAPSULE | Freq: Two times a day (BID) | ORAL | Status: DC

## 2012-02-25 NOTE — Assessment & Plan Note (Signed)
Pt counseled on weight loss, continued exercise, portion control.  Due for fasting  Lab work (he will return next week).  Tdap given today. Up to date on flu shot.

## 2012-02-25 NOTE — Progress Notes (Signed)
Subjective:    Patient ID: Jordan [name], male    DOB: October 23, 1981, 31 y.o.   MRN: [id number]  HPI  Jordan [name] is a 31 yr old male who presents today with his wife for complete physical.  He has not been seen since 2011.    Preventative Care- Not fasting- pt had breakfast at 7AM today.  Reports that weight has been stable.  Not able to exercise-  [place]. Rides a bike.  Does step ups/downs.  Diet is improving.  Needs to work on portion control. Due for tetanus.  Had flu shot in October.    Pain- reports that this is an off of pain meds.  Pain persists- has decided that he would rather live with the pain, than deal with side effects of pain medication.   HTN- BP is a hair elevated today.  Depression- reports good mood. Notes some anxiety with crowds. He is following with Dr. Gibson Ramp- psychology.  Declines medication. Has been on cymbalta in the past and didn't like the way that it made him feel.     Review of Systems  Constitutional: Negative for unexpected weight change.  HENT: Positive for congestion.   Eyes: Negative for visual disturbance.  Respiratory: Negative for cough.   Cardiovascular: Negative for palpitations.  Gastrointestinal: Negative for nausea, vomiting and diarrhea.  Genitourinary: Negative for dysuria, frequency and hematuria.       Polyuria/polydipsia  Musculoskeletal: Positive for arthralgias.  Neurological: Negative for headaches.  Hematological: Negative for adenopathy.  Psychiatric/Behavioral:       See hpi   No past medical history on file.  History   Social History  . Marital Status: Married    Spouse Name: N/A    Number of Children: 1  . Years of Education: N/A   Occupational History  .     Social History Main Topics  . Smoking status: Former Smoker -- 2 years    Types: Cigarettes  . Smokeless tobacco: Not on file  . Alcohol Use: Yes     occasional  . Drug Use: Not on file  . Sexually Active: Not on file    Other Topics Concern  . Not on file   Social History Narrative   Caffeine Use:  6 beverages dailyRegular exercise:  2 x weekly    No past surgical history on file.  No family history on file.  Allergies  Allergen Reactions  . Butrans (Buprenorphine Hcl)     Difficulty breathing, n/v  . Nucynta (Tapentadol Hydrochloride) Anaphylaxis    No current outpatient prescriptions on file prior to visit.    BP 144/92  Pulse 82  Temp(Src) 98 F (36.7 C) (Oral)  Resp 16  Ht 5\' 7"  (1.702 m)  Wt 281 lb (127.461 kg)  BMI 44.01 kg/m2  SpO2 98%   Physical Exam  Constitutional: He is oriented to person, place, and time. He appears well-developed and well-nourished. No distress.  HENT:  Head: Normocephalic and atraumatic.  Right Ear: Tympanic membrane and ear canal normal.  Left Ear: Tympanic membrane and ear canal normal.  Mouth/Throat: Oropharynx is clear and moist.  Eyes: Pupils are equal, round, and reactive to light. No scleral icterus.  Neck: Normal range of motion. No thyromegaly present.  Cardiovascular: Normal rate and regular rhythm.   No murmur heard. Pulmonary/Chest: Effort normal and breath sounds normal. No respiratory distress. He has no wheezes. He has no rales. He exhibits no tenderness.  Abdominal:  Soft. Bowel sounds are normal. He exhibits no distension and no mass. There is no tenderness. There is no rebound and no guarding.  Musculoskeletal: He exhibits no edema.  Lymphadenopathy:    He has no cervical adenopathy.  Neurological: He is alert and oriented to person, place, and time. He has normal reflexes. He exhibits normal muscle tone. Coordination normal.  Skin: Skin is warm and dry.  Psychiatric: He has a normal mood and affect. His behavior is normal. Judgment and thought content normal.          Assessment & Plan:        Objective:   Physical Exam        Assessment & Plan:

## 2012-02-25 NOTE — Patient Instructions (Addendum)
Please return to the lab fasting on Tuesday of next week. Keep up the good work with exercise/healthy eating/portion control. Please follow up in 6 months for a blood pressure check, sooner as needed.

## 2012-02-25 NOTE — Assessment & Plan Note (Signed)
BP Readings from Last 3 Encounters:  02/25/12 144/92  01/21/10 136/80  Mild elevation today, plan to repeat in 6 months.  Pt reports "white coat htn."

## 2012-02-25 NOTE — Assessment & Plan Note (Signed)
Unchanged, currently using dietary supplements only.

## 2012-02-25 NOTE — Assessment & Plan Note (Signed)
He declines medication, but continue to follow with a therapist.

## 2012-02-28 ENCOUNTER — Telehealth: Payer: Self-pay | Admitting: *Deleted

## 2012-02-28 NOTE — Telephone Encounter (Signed)
Received fax from [facility] that Metanx is the covered equivalent to L-Methylfolate-B6-B12  2.8-25-2 and they want to know if they can dispense Metanx? Per Sandford Craze, NP ok to change. Notified pharmacist and verified that Metanx is a slightly different strength than the L-Methylfolate. Medication list updated to reflect this change.

## 2012-03-02 ENCOUNTER — Encounter: Payer: Self-pay | Admitting: Family

## 2012-09-01 ENCOUNTER — Ambulatory Visit: Payer: Self-pay | Admitting: Family

## 2013-04-03 ENCOUNTER — Other Ambulatory Visit: Payer: Self-pay | Admitting: Family

## 2013-04-03 NOTE — Telephone Encounter (Signed)
Limbrel request [Last Rx 03.01.2013 #60x11]; Last OV 03.01.2013, no future appt scheduled/SLS Please advise.

## 2013-04-04 NOTE — Telephone Encounter (Signed)
Can send 30 tabs no refills.  Needs follow up visit.

## 2013-04-04 NOTE — Telephone Encounter (Signed)
Rx request to pharmacy; *PATIENT NEEDS OFFICE VISIT PRIOR TO FUTURE REFILLS*/SLS     

## 2013-06-11 ENCOUNTER — Emergency Department (HOSPITAL_COMMUNITY)
Admission: EM | Admit: 2013-06-11 | Discharge: 2013-06-11 | Disposition: A | Discharge status: Home / Self Care | Payer: Medicare Other | Attending: Emergency Medicine | Admitting: Emergency Medicine

## 2013-06-11 ENCOUNTER — Encounter (HOSPITAL_COMMUNITY): Payer: Self-pay

## 2013-06-11 DIAGNOSIS — Z87891 Personal history of nicotine dependence: Secondary | ICD-10-CM | POA: Insufficient documentation

## 2013-06-11 DIAGNOSIS — R0602 Shortness of breath: Secondary | ICD-10-CM | POA: Insufficient documentation

## 2013-06-11 DIAGNOSIS — R21 Rash and other nonspecific skin eruption: Secondary | ICD-10-CM | POA: Insufficient documentation

## 2013-06-11 DIAGNOSIS — T7840XA Allergy, unspecified, initial encounter: Secondary | ICD-10-CM

## 2013-06-11 LAB — BASIC METABOLIC PANEL
CO2: 22 mEq/L (ref 19–32)
Calcium: 9.2 mg/dL (ref 8.4–10.5)
Chloride: 100 mEq/L (ref 96–112)
Creatinine, Ser: 0.98 mg/dL (ref 0.50–1.35)
Glucose, Bld: 107 mg/dL — ABNORMAL HIGH (ref 70–99)
Sodium: 133 mEq/L — ABNORMAL LOW (ref 135–145)

## 2013-06-11 LAB — CBC WITH DIFFERENTIAL/PLATELET
Basophils Absolute: 0 10*3/uL (ref 0.0–0.1)
Eosinophils Relative: 0 % (ref 0–5)
HCT: 46.4 % (ref 39.0–52.0)
Lymphocytes Relative: 21 % (ref 12–46)
Lymphs Abs: 2.1 10*3/uL (ref 0.7–4.0)
MCV: 80.7 fL (ref 78.0–100.0)
Monocytes Absolute: 0.4 10*3/uL (ref 0.1–1.0)
Neutro Abs: 7.4 10*3/uL (ref 1.7–7.7)
RBC: 5.75 MIL/uL (ref 4.22–5.81)
RDW: 14.3 % (ref 11.5–15.5)
WBC: 9.9 10*3/uL (ref 4.0–10.5)

## 2013-06-11 MED ORDER — METHYLPREDNISOLONE SODIUM SUCC 125 MG IJ SOLR
125.0000 mg | Freq: Once | INTRAMUSCULAR | Status: AC
  Administered 2013-06-11: 125 mg via INTRAVENOUS
  Filled 2013-06-11: qty 2

## 2013-06-11 MED ORDER — EPINEPHRINE 0.3 MG/0.3ML IJ SOAJ: 0.3000 mg | INTRAMUSCULAR | Status: DC | PRN

## 2013-06-11 MED ORDER — DIPHENHYDRAMINE HCL 50 MG/ML IJ SOLN
25.0000 mg | Freq: Once | INTRAMUSCULAR | Status: AC
  Administered 2013-06-11: 25 mg via INTRAVENOUS
  Filled 2013-06-11: qty 1

## 2013-06-11 MED ORDER — PREDNISONE 20 MG PO TABS: 60.0000 mg | ORAL_TABLET | Freq: Every day | ORAL | Status: DC

## 2013-06-11 MED ORDER — FAMOTIDINE IN NACL 20-0.9 MG/50ML-% IV SOLN
20.0000 mg | Freq: Once | INTRAVENOUS | Status: AC
  Administered 2013-06-11: 20 mg via INTRAVENOUS
  Filled 2013-06-11: qty 50

## 2013-06-11 MED ORDER — FAMOTIDINE 20 MG PO TABS: 20.0000 mg | ORAL_TABLET | Freq: Two times a day (BID) | ORAL | Status: DC

## 2013-06-11 MED ORDER — DIPHENHYDRAMINE HCL 25 MG PO TABS: 25.0000 mg | ORAL_TABLET | Freq: Four times a day (QID) | ORAL | Status: DC

## 2013-06-11 NOTE — ED Provider Notes (Signed)
History    CSN: [id number] Arrival date & time 06/11/13  1233 First MD Initiated Contact with Patient 06/11/13 1404      Chief Complaint  Patient presents with  . Rash  . Shortness of Breath    HPI The patient was mowing his yard 2 days ago. Yesterday he woke up and had a rash on the back of his head, his forearms.  Today he woke up and no rashes over entire body including his groin. As the day progressed the patient or having trouble with his breathing. He felt like his throat was swelling. Patient decided to come to the emergency room for further evaluation. He denies any chest pain. He denies any abdominal pain.  The patient knows that he has poison sumac in his yard. He states he has had trouble in the past. History reviewed. No pertinent past medical history.  History reviewed. No pertinent past surgical history.  History reviewed. No pertinent family history.  History  Substance Use Topics  . Smoking status: Former Smoker -- 2 years    Types: Cigarettes  . Smokeless tobacco: Never Used  . Alcohol Use: Yes     Comment: occasional      Review of Systems  All other systems reviewed and are negative.    Allergies  Butrans and Nucynta  Home Medications   Current Outpatient Rx  Name  Route  Sig  Dispense  Refill  . aspirin-sod bicarb-citric acid (ALKA-SELTZER) 325 MG TBEF   Oral   Take 325 mg by mouth every 6 (six) hours as needed.         . calcium carbonate (TUMS - DOSED IN MG ELEMENTAL CALCIUM) 500 MG chewable tablet   Oral   Chew 1 tablet by mouth daily as needed for heartburn.         . Flavocoxid-Cit Zn Bisglcinate (LIMBREL500) 500-50 MG CAPS   Oral   Take 1 capsule by mouth 2 (two) times daily.         Marland Kitchen L-Methylfolate-B6-B12 (METANX PO)   Oral   Take 1 tablet by mouth 2 (two) times daily.         . diphenhydrAMINE (BENADRYL) 25 MG tablet   Oral   Take 1 tablet (25 mg total) by mouth every 6 (six) hours.   20 tablet   0   . EPINEPHrine  (EPIPEN) 0.3 mg/0.3 mL DEVI   Intramuscular   Inject 0.3 mLs (0.3 mg total) into the muscle as needed (for  life-threatening allergic reaction).   1 Device   `   . famotidine (PEPCID) 20 MG tablet   Oral   Take 1 tablet (20 mg total) by mouth 2 (two) times daily.   14 tablet   0   . predniSONE (DELTASONE) 20 MG tablet   Oral   Take 3 tablets (60 mg total) by mouth daily.   15 tablet   0     BP 140/98  Pulse 97  Temp(Src) 97.8 F (36.6 C) (Oral)  Resp 25  Ht 5\' 6"  (1.676 m)  Wt 265 lb (120.203 kg)  BMI 42.79 kg/m2  SpO2 99%  Physical Exam  Nursing note and vitals reviewed. Constitutional: He appears well-developed and well-nourished. No distress.  HENT:  Head: Normocephalic and atraumatic.  Right Ear: External ear normal.  Left Ear: External ear normal.  Mouth/Throat: No oropharyngeal exudate.  No uvula edema, no stridor, normal speech  Eyes: Conjunctivae are normal. Right eye exhibits no discharge. Left eye exhibits  no discharge. No scleral icterus.  Neck: Neck supple. No tracheal deviation present.  Cardiovascular: Normal rate, regular rhythm and intact distal pulses.   Pulmonary/Chest: Effort normal and breath sounds normal. No stridor. No respiratory distress. He has no wheezes. He has no rales.  Abdominal: Soft. Bowel sounds are normal. He exhibits no distension. There is no tenderness. There is no rebound and no guarding.  Musculoskeletal: He exhibits no edema and no tenderness.  Neurological: He is alert. He has normal strength. No sensory deficit. Cranial nerve deficit:  no gross defecits noted. He exhibits normal muscle tone. He displays no seizure activity. Coordination normal.  Skin: Skin is warm and dry. Rash noted.   Erythematous rash on his bilateral upper extremities and torso, no discrete urticarial lesions  Psychiatric: He has a normal mood and affect.    ED Course  Procedures (including critical care time) Medications  methylPREDNISolone sodium  succinate (SOLU-MEDROL) 125 mg/2 mL injection 125 mg (125 mg Intravenous Given 06/11/13 1440)  diphenhydrAMINE (BENADRYL) injection 25 mg (25 mg Intravenous Given 06/11/13 1441)  famotidine (PEPCID) IVPB 20 mg (20 mg Intravenous New Bag/Given 06/11/13 1440)    Labs Reviewed  BASIC METABOLIC PANEL - Abnormal; Notable for the following:    Sodium 133 (*)    Glucose, Bld 107 (*)    BUN 4 (*)    All other components within normal limits  CBC WITH DIFFERENTIAL   No results found.   1. Allergic reaction, initial encounter       MDM  Patient was treated in emergent medications for allergic reaction. Patient did have systemic symptoms but objectively doesn't appear to have evidence of anaphylaxis.  Unless, he will be discharged home on a course of steroids and antihistamines. I will also give him an EpiPen..  I will discharge him home with recommendations to followup with an allergist.       Celene Kras, MD 06/11/13 [phone]

## 2013-06-11 NOTE — ED Notes (Signed)
Patient reports that he was mowing his yard 2 days ago and woke yesterday with rash on back of head and forearms. Today he awakes with rash on entire body, feels like throat swelling  and having SOB.

## 2013-06-21 ENCOUNTER — Other Ambulatory Visit: Payer: Self-pay | Admitting: Family

## 2013-06-22 NOTE — Telephone Encounter (Signed)
Pt last seen 02/25/12 and has no future appts on file. Please advise.

## 2013-06-25 NOTE — Telephone Encounter (Signed)
Request for refill on: Limbrel Last Rx: 04.08.14, #30x0 w/note PATIENT MUST HAVE OFFICE VISIT FOR FURTHER REFILLS* Last OV: 03.01.13 Return: 6-Months Please advise on refills/SLS

## 2013-06-25 NOTE — Telephone Encounter (Signed)
Please call pt and let him know he is due for follow up prior to additional refills.

## 2013-06-25 NOTE — Telephone Encounter (Signed)
Needs OV prior to additional refills please.  

## 2013-06-26 NOTE — Telephone Encounter (Signed)
LMOM with contact name and number [for return call] RE: Needs OV prior to future refills per provider /SLS

## 2013-12-02 ENCOUNTER — Emergency Department (HOSPITAL_BASED_OUTPATIENT_CLINIC_OR_DEPARTMENT_OTHER)
Admission: EM | Admit: 2013-12-02 | Discharge: 2013-12-02 | Disposition: A | Discharge status: Home / Self Care | Payer: No Typology Code available for payment source | Attending: Emergency Medicine | Admitting: Emergency Medicine

## 2013-12-02 ENCOUNTER — Emergency Department (HOSPITAL_BASED_OUTPATIENT_CLINIC_OR_DEPARTMENT_OTHER): Payer: No Typology Code available for payment source

## 2013-12-02 ENCOUNTER — Encounter (HOSPITAL_BASED_OUTPATIENT_CLINIC_OR_DEPARTMENT_OTHER): Payer: Self-pay | Admitting: Emergency Medicine

## 2013-12-02 DIAGNOSIS — Z87891 Personal history of nicotine dependence: Secondary | ICD-10-CM | POA: Insufficient documentation

## 2013-12-02 DIAGNOSIS — Y9389 Activity, other specified: Secondary | ICD-10-CM | POA: Insufficient documentation

## 2013-12-02 DIAGNOSIS — Z79899 Other long term (current) drug therapy: Secondary | ICD-10-CM | POA: Insufficient documentation

## 2013-12-02 DIAGNOSIS — Y92410 Unspecified street and highway as the place of occurrence of the external cause: Secondary | ICD-10-CM | POA: Insufficient documentation

## 2013-12-02 DIAGNOSIS — S239XXA Sprain of unspecified parts of thorax, initial encounter: Secondary | ICD-10-CM | POA: Insufficient documentation

## 2013-12-02 DIAGNOSIS — S29019A Strain of muscle and tendon of unspecified wall of thorax, initial encounter: Secondary | ICD-10-CM

## 2013-12-02 DIAGNOSIS — IMO0002 Reserved for concepts with insufficient information to code with codable children: Secondary | ICD-10-CM | POA: Insufficient documentation

## 2013-12-02 MED ORDER — MELOXICAM 7.5 MG PO TABS: 7.5000 mg | ORAL_TABLET | Freq: Every day | ORAL | Status: DC

## 2013-12-02 MED ORDER — CYCLOBENZAPRINE HCL 10 MG PO TABS: 10.0000 mg | ORAL_TABLET | Freq: Two times a day (BID) | ORAL | Status: DC | PRN

## 2013-12-02 MED ORDER — IBUPROFEN 800 MG PO TABS
800.0000 mg | ORAL_TABLET | Freq: Once | ORAL | Status: AC
  Administered 2013-12-02: 800 mg via ORAL
  Filled 2013-12-02: qty 1
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
## 2013-12-03 NOTE — ED Provider Notes (Signed)
Medical screening examination/treatment/procedure(s) were performed by non-physician practitioner and as supervising physician I was immediately available for consultation/collaboration.  EKG Interpretation   None         Audree Camel, MD 12/03/13 1259

## 2014-01-16 ENCOUNTER — Telehealth: Payer: Self-pay | Admitting: Family

## 2014-01-16 ENCOUNTER — Telehealth: Payer: Self-pay | Admitting: *Deleted

## 2014-01-16 NOTE — Telephone Encounter (Signed)
Received call from pt's wife stating pt had episode of chest pressure and left arm numbness 3 days last week. Has not had any further episodes this week and wanted to be seen in our office for assessment of chest pain.  Spoke with pt, he denies current symptoms but reports that his dad had heart disease and had heart attack in his 2730's. Advised pt to proceed to the ER for further assessment. If he is cleared from cardiac standpoint advised pt we will see him in the office for follow up and further workup. Pt voices understanding.

## 2014-01-16 NOTE — Telephone Encounter (Signed)
Attempted to call back twice; left one voice mail and it was full then left office number for callback.  [name]/ spouse called reporting chest pain.  Call had been screened and emergent symptoms ruled out prior to attempting to call back.

## 2014-01-17 ENCOUNTER — Emergency Department (HOSPITAL_BASED_OUTPATIENT_CLINIC_OR_DEPARTMENT_OTHER): Payer: Medicare HMO

## 2014-01-17 ENCOUNTER — Encounter (HOSPITAL_BASED_OUTPATIENT_CLINIC_OR_DEPARTMENT_OTHER): Payer: Self-pay | Admitting: Emergency Medicine

## 2014-01-17 ENCOUNTER — Emergency Department (HOSPITAL_BASED_OUTPATIENT_CLINIC_OR_DEPARTMENT_OTHER)
Admission: EM | Admit: 2014-01-17 | Discharge: 2014-01-17 | Disposition: A | Discharge status: Home / Self Care | Payer: Medicare HMO | Attending: Emergency Medicine | Admitting: Emergency Medicine

## 2014-01-17 ENCOUNTER — Encounter: Payer: Self-pay | Admitting: Cardiovascular Disease

## 2014-01-17 DIAGNOSIS — Z7982 Long term (current) use of aspirin: Secondary | ICD-10-CM | POA: Insufficient documentation

## 2014-01-17 DIAGNOSIS — Z79899 Other long term (current) drug therapy: Secondary | ICD-10-CM | POA: Insufficient documentation

## 2014-01-17 DIAGNOSIS — R0789 Other chest pain: Secondary | ICD-10-CM | POA: Insufficient documentation

## 2014-01-17 DIAGNOSIS — R110 Nausea: Secondary | ICD-10-CM | POA: Insufficient documentation

## 2014-01-17 DIAGNOSIS — R079 Chest pain, unspecified: Secondary | ICD-10-CM

## 2014-01-17 DIAGNOSIS — IMO0002 Reserved for concepts with insufficient information to code with codable children: Secondary | ICD-10-CM | POA: Insufficient documentation

## 2014-01-17 DIAGNOSIS — Z791 Long term (current) use of non-steroidal anti-inflammatories (NSAID): Secondary | ICD-10-CM | POA: Insufficient documentation

## 2014-01-17 DIAGNOSIS — R0602 Shortness of breath: Secondary | ICD-10-CM | POA: Insufficient documentation

## 2014-01-17 DIAGNOSIS — F17200 Nicotine dependence, unspecified, uncomplicated: Secondary | ICD-10-CM | POA: Insufficient documentation

## 2014-01-17 HISTORY — DX: Anxiety disorder, unspecified: F41.9

## 2014-01-17 HISTORY — DX: Essential (primary) hypertension: I10

## 2014-01-17 HISTORY — DX: Chronic pain syndrome: G89.4

## 2014-01-17 HISTORY — DX: Depression, unspecified: F32.A

## 2014-01-17 HISTORY — DX: Unspecified osteoarthritis, unspecified site: M19.90

## 2014-01-17 HISTORY — DX: Major depressive disorder, single episode, unspecified: F32.9

## 2014-01-17 LAB — COMPREHENSIVE METABOLIC PANEL
ALT: 14 U/L (ref 0–53)
AST: 17 U/L (ref 0–37)
Albumin: 3.6 g/dL (ref 3.5–5.2)
Alkaline Phosphatase: 76 U/L (ref 39–117)
BUN: 4 mg/dL — ABNORMAL LOW (ref 6–23)
CALCIUM: 9.2 mg/dL (ref 8.4–10.5)
CO2: 26 meq/L (ref 19–32)
Chloride: 103 mEq/L (ref 96–112)
Creatinine, Ser: 0.8 mg/dL (ref 0.50–1.35)
GFR calc Af Amer: >90 mL/min (ref >90)
GFR calc non Af Amer: >90 mL/min (ref >90)
GLUCOSE: 89 mg/dL (ref 70–99)
Potassium: 4.3 mEq/L (ref 3.7–5.3)
Sodium: 142 mEq/L (ref 137–147)
TOTAL PROTEIN: 7 g/dL (ref 6.0–8.3)
Total Bilirubin: 0.3 mg/dL (ref 0.3–1.2)

## 2014-01-17 LAB — CBC WITH DIFFERENTIAL/PLATELET
BAND NEUTROPHILS: 0 % (ref 0–10)
BASOS ABS: 0 10*3/uL (ref 0.0–0.1)
BASOS PCT: 0 % (ref 0–1)
BLASTS: 0 %
Eosinophils Absolute: 0.1 10*3/uL (ref 0.0–0.7)
Eosinophils Relative: 1 % (ref 0–5)
HEMATOCRIT: 41.8 % (ref 39.0–52.0)
HEMOGLOBIN: 13.5 g/dL (ref 13.0–17.0)
LYMPHS ABS: 3.3 10*3/uL (ref 0.7–4.0)
Lymphocytes Relative: 47 % — ABNORMAL HIGH (ref 12–46)
MCH: 26.4 pg (ref 26.0–34.0)
MCHC: 32.3 g/dL (ref 30.0–36.0)
MCV: 81.6 fL (ref 78.0–100.0)
METAMYELOCYTES PCT: 0 %
MYELOCYTES: 0 %
Monocytes Absolute: 0.4 10*3/uL (ref 0.1–1.0)
Monocytes Relative: 6 % (ref 3–12)
Neutro Abs: 3.3 10*3/uL (ref 1.7–7.7)
Neutrophils Relative %: 46 % (ref 43–77)
Platelets: 248 10*3/uL (ref 150–400)
Promyelocytes Absolute: 0 %
RBC: 5.12 MIL/uL (ref 4.22–5.81)
RDW: 14.6 % (ref 11.5–15.5)
WBC: 7.1 10*3/uL (ref 4.0–10.5)
nRBC: 0 /100 WBC

## 2014-01-17 LAB — TROPONIN I: Troponin I: <0.3 ng/mL (ref <0.30)

## 2014-01-17 NOTE — ED Notes (Signed)
Pt amb to room 12 with quick steady gait in nad. Pt reports "chest pains all my life off and on" more frequent over the last year. Pt states yesterday he had a feeling "like someone is sitting on my chest" and sharp, stabbing chest pains radiating down his left arm with sob and nausea. Pt states he could not seek treatment yesterday because he had to care for his son. Pain resolved yesterday, pt states he has had no pain today at all. Pt called his pcp this am and was told to come to ed for eval.

## 2014-01-17 NOTE — ED Provider Notes (Addendum)
CSN: [id number]     Arrival date & time 01/17/14  [id number] History   First MD Initiated Contact with Patient 01/17/14 [phone]     Chief Complaint  Patient presents with  . Chest Pain    HPI . Pt reports "chest pains all my life off and on" more frequent over the last year. Pt states yesterday he had a feeling "like someone is sitting on my chest" and sharp, stabbing chest pains radiating down his left arm with sob and nausea. Pt states he could not seek treatment yesterday because he had to care for his son. Pain resolved yesterday, pt states he has had no pain today at all. Pt called his pcp this am and was told to come to ed for eval.  History reviewed. No pertinent past medical history. Past Surgical History  Procedure Laterality Date  . Knee surgery Right    History reviewed. No pertinent family history. History  Substance Use Topics  . Smoking status: Current Every Day Smoker -- 2 years    Types: Cigarettes  . Smokeless tobacco: Never Used  . Alcohol Use: Yes     Comment: occasional    Review of Systems  All other systems reviewed and are negative.    Allergies  Butrans; Nucynta; and Sumac  Home Medications   Current Outpatient Rx  Name  Route  Sig  Dispense  Refill  . aspirin 81 MG tablet   Oral   Take 81 mg by mouth daily.         Marland Kitchen. aspirin-sod bicarb-citric acid (ALKA-SELTZER) 325 MG TBEF   Oral   Take 325 mg by mouth every 6 (six) hours as needed.         . calcium carbonate (TUMS - DOSED IN MG ELEMENTAL CALCIUM) 500 MG chewable tablet   Oral   Chew 1 tablet by mouth daily as needed for heartburn.         . cyclobenzaprine (FLEXERIL) 10 MG tablet   Oral   Take 1 tablet (10 mg total) by mouth 2 (two) times daily as needed for muscle spasms.   20 tablet   0   . diphenhydrAMINE (BENADRYL) 25 MG tablet   Oral   Take 1 tablet (25 mg total) by mouth every 6 (six) hours.   20 tablet   0   . famotidine (PEPCID) 20 MG tablet   Oral   Take 1 tablet (20 mg  total) by mouth 2 (two) times daily.   14 tablet   0   . Flavocoxid-Cit Zn Bisglcinate (LIMBREL500) 500-50 MG CAPS   Oral   Take 1 capsule by mouth 2 (two) times daily.         Marland Kitchen. L-Methylfolate-B6-B12 (METANX PO)   Oral   Take 1 tablet by mouth 2 (two) times daily.         . meloxicam (MOBIC) 7.5 MG tablet   Oral   Take 1 tablet (7.5 mg total) by mouth daily.   10 tablet   0   . predniSONE (DELTASONE) 20 MG tablet   Oral   Take 3 tablets (60 mg total) by mouth daily.   15 tablet   0    BP 137/93  Pulse 84  Temp(Src) 98.2 F (36.8 C) (Oral)  Resp 18  SpO2 100% Physical Exam  Nursing note and vitals reviewed. Constitutional: He is oriented to person, place, and time. He appears well-developed and well-nourished. No distress.  HENT:  Head: Normocephalic and atraumatic.  Eyes: Pupils are equal, round, and reactive to light.  Neck: Normal range of motion.  Cardiovascular: Normal rate and intact distal pulses.  Exam reveals no gallop and no friction rub.   No murmur heard. Pulmonary/Chest: No respiratory distress.  Abdominal: Normal appearance. He exhibits no distension.  Musculoskeletal: Normal range of motion.  Neurological: He is alert and oriented to person, place, and time. No cranial nerve deficit.  Skin: Skin is warm and dry. No rash noted.  Psychiatric: He has a normal mood and affect. His behavior is normal.    ED Course  Procedures (including critical care time) Labs Review Labs Reviewed  COMPREHENSIVE METABOLIC PANEL - Abnormal; Notable for the following:    BUN 4 (*)    All other components within normal limits  CBC WITH DIFFERENTIAL - Abnormal; Notable for the following:    Lymphocytes Relative 47 (*)    All other components within normal limits  TROPONIN I   Imaging Review Dg Chest 2 View  01/17/2014   CLINICAL DATA:  Left chest pain, numbness, nausea  EXAM: CHEST  2 VIEW  COMPARISON:  12/02/2013 thoracic spine exam     IMPRESSION: No  active cardiopulmonary disease.   Electronically Signed   By: Ruel Favors M.D.   On: 01/17/2014 10:57    EKG Interpretation    Date/Time:  Thursday January 17 2014 09:33:59 EST Ventricular Rate:  89 PR Interval:  144 QRS Duration: 98 QT Interval:  374 QTC Calculation: 455 R Axis:   6 Text Interpretation:  Normal sinus rhythm Incomplete right bundle branch block Cannot rule out Anterior infarct , age undetermined Abnormal ECG Confirmed by Georgann Bramble  MD, Mariacristina Aday ([id number]) on 01/17/2014 9:41:43 AM           Heart score = 2 MDM   1. Chest pain     Low-risk patient is stable at time of discharge. We'll encourage to use daily aspirin and have set up followup appointment with cardiology on Monday.   Nelia Shi, MD 01/17/14 1136  Nelia Shi, MD 01/17/14 [phone]

## 2014-01-17 NOTE — Discharge Instructions (Signed)
Aspirin and Your Heart °Aspirin affects the way your blood clots and helps "thin" the blood. Aspirin has many uses in heart disease. It may be used as a primary prevention to help reduce the risk of heart related events. It also can be used as a secondary measure to prevent more heart attacks or to prevent additional damage from blood clots.  °ASPIRIN MAY HELP IF YOU: °· Have had a heart attack or chest pain. °· Have undergone open heart surgery such as CABG (Coronary Artery Bypass Surgery). °· Have had coronary angioplasty with or without stents. °· Have experienced a stroke or TIA (transient ischemic attack). °· Have peripheral vascular disease (PAD). °· Have chronic heart rhythm problems such as atrial fibrillation. °· Are at risk for heart disease. °BEFORE STARTING ASPIRIN °Before you start taking aspirin, your caregiver will need to review your medical history. Many things will need to be taken into consideration, such as: °· Smoking status. °· Blood pressure. °· Diabetes. °· Gender. °· Weight. °· Cholesterol level. °ASPIRIN DOSES °· Aspirin should only be taken on the advice of your caregiver. Talk to your caregiver about how much aspirin you should take. Aspirin comes in different doses such as: °· 81 mg. °· 162 mg. °· 325 mg. °· The aspirin dose you take may be affected by many factors, some of which include: °· Your current medications, especially if your are taking blood-thinners or anti-platelet medicine. °· Liver function. °· Heart disease risk. °· Age. °· Aspirin comes in two forms: °· Non-enteric-coated. This type of aspirin does not have a coating and is absorbed faster. Non-enteric coated aspirin is recommended for patients experiencing chest pain symptoms. This type of aspirin also comes in a chewable form. °· Enteric-coated. This means the aspirin has a special coating that releases the medicine very slowly. Enteric-coated aspirin causes less stomach upset. This type of aspirin should not be chewed  or crushed. °ASPIRIN SIDE EFFECTS °Daily use of aspirin can increase your risk of serious side effects. Some of these include: °· Increased bleeding. This can range from a cut that does not stop bleeding to more serious problems such as stomach bleeding or bleeding into the brain (Intracerebral bleeding). °· Increased bruising. °· Stomach upset. °· An allergic reaction such as red, itchy skin. °· Increased risk of bleeding when combined with non-steroidal anti-inflammatory medicine (NSAIDS). °· Alcohol should be drank in moderation when taking aspirin. Alcohol can increase the risk of stomach bleeding when taken with aspirin. °· Aspirin should not be given to children less than 18 years of age due to the association of Reye syndrome. Reye syndrome is a serious illness that can affect the brain and liver. Studies have linked Reye syndrome with aspirin use in children. °· People that have nasal polyps have an increased risk of developing an aspirin allergy. °SEEK MEDICAL CARE IF:  °· You develop an allergic reaction such as: °· Hives. °· Itchy skin. °· Swelling of the lips, tongue or face. °· You develop stomach pain. °· You have unusual bleeding or bruising. °· You have ringing in your ears. °SEEK IMMEDIATE MEDICAL CARE IF:  °· You have severe chest pain, especially if the pain is crushing or pressure-like and spreads to the arms, back, neck, or jaw. THIS IS AN EMERGENCY. Do not wait to see if the pain will go away. Get medical help at once. Call your local emergency services (911 in the [place] DO NOT drive yourself to the hospital. °· You have stroke-like symptoms   such as: °· Loss of vision. °· Difficulty talking. °· Numbness or weakness on one side of your body. °· Numbness or weakness in your arm or leg. °· Not thinking clearly or feeling confused. °· Your bowel movements are bloody, dark red or black in color. °· You vomit or cough up blood. °· You have blood in your urine. °· You have shortness of breath,  coughing or wheezing. °MAKE SURE YOU:  °· Understand these instructions. °· Will monitor your condition. °· Seek immediate medical care if necessary. °Document Released: 11/25/2008 Document Revised: 04/09/2013 Document Reviewed: 11/25/2008 °ExitCare® Patient Information ©2014 [employer]. °Chest Pain (Nonspecific) °It is often hard to give a specific diagnosis for the cause of chest pain. There is always a chance that your pain could be related to something serious, such as a heart attack or a blood clot in the lungs. You need to follow up with your caregiver for further evaluation. °CAUSES  °· Heartburn. °· Pneumonia or bronchitis. °· Anxiety or stress. °· Inflammation around your heart (pericarditis) or lung (pleuritis or pleurisy). °· A blood clot in the lung. °· A collapsed lung (pneumothorax). It can develop suddenly on its own (spontaneous pneumothorax) or from injury (trauma) to the chest. °· Shingles infection (herpes zoster virus). °The chest wall is composed of bones, muscles, and cartilage. Any of these can be the source of the pain. °· The bones can be bruised by injury. °· The muscles or cartilage can be strained by coughing or overwork. °· The cartilage can be affected by inflammation and become sore (costochondritis). °DIAGNOSIS  °Lab tests or other studies, such as X-rays, electrocardiography, stress testing, or cardiac imaging, may be needed to find the cause of your pain.  °TREATMENT  °· Treatment depends on what may be causing your chest pain. Treatment may include: °· Acid blockers for heartburn. °· Anti-inflammatory medicine. °· Pain medicine for inflammatory conditions. °· Antibiotics if an infection is present. °· You may be advised to change lifestyle habits. This includes stopping smoking and avoiding alcohol, caffeine, and chocolate. °· You may be advised to keep your head raised (elevated) when sleeping. This reduces the chance of acid going backward from your stomach into your  esophagus. °· Most of the time, nonspecific chest pain will improve within 2 to 3 days with rest and mild pain medicine. °HOME CARE INSTRUCTIONS  °· If antibiotics were prescribed, take your antibiotics as directed. Finish them even if you start to feel better. °· For the next few days, avoid physical activities that bring on chest pain. Continue physical activities as directed. °· Do not smoke. °· Avoid drinking alcohol. °· Only take over-the-counter or prescription medicine for pain, discomfort, or fever as directed by your caregiver. °· Follow your caregiver's suggestions for further testing if your chest pain does not go away. °· Keep any follow-up appointments you made. If you do not go to an appointment, you could develop lasting (chronic) problems with pain. If there is any problem keeping an appointment, you must call to reschedule. °SEEK MEDICAL CARE IF:  °· You think you are having problems from the medicine you are taking. Read your medicine instructions carefully. °· Your chest pain does not go away, even after treatment. °· You develop a rash with blisters on your chest. °SEEK IMMEDIATE MEDICAL CARE IF:  °· You have increased chest pain or pain that spreads to your arm, neck, jaw, back, or abdomen. °· You develop shortness of breath, an increasing cough, or you   are coughing up blood. °· You have severe back or abdominal pain, feel nauseous, or vomit. °· You develop severe weakness, fainting, or chills. °· You have a fever. °THIS IS AN EMERGENCY. Do not wait to see if the pain will go away. Get medical help at once. Call your local emergency services (911 in [place] Do not drive yourself to the hospital. °MAKE SURE YOU:  °· Understand these instructions. °· Will watch your condition. °· Will get help right away if you are not doing well or get worse. °Document Released: 09/22/2005 Document Revised: 03/06/2012 Document Reviewed: 07/18/2008 °ExitCare® Patient Information ©2014 [employer]. ° °

## 2014-01-17 NOTE — ED Notes (Signed)
Patient getting dressed & preparing for discharge.

## 2014-01-18 NOTE — Telephone Encounter (Signed)
Noted. Pt was evaluated in ED and referred to cardiology.

## 2014-01-21 ENCOUNTER — Ambulatory Visit: Payer: Self-pay | Admitting: Cardiovascular Disease

## 2014-01-23 ENCOUNTER — Ambulatory Visit (INDEPENDENT_AMBULATORY_CARE_PROVIDER_SITE_OTHER): Payer: Medicare HMO | Admitting: Cardiovascular Disease

## 2014-01-23 ENCOUNTER — Encounter: Payer: Self-pay | Admitting: Cardiovascular Disease

## 2014-01-23 VITALS — BP 140/100 | HR 64 | Ht 66.0 in | Wt 278.0 lb

## 2014-01-23 DIAGNOSIS — F329 Major depressive disorder, single episode, unspecified: Secondary | ICD-10-CM

## 2014-01-23 DIAGNOSIS — I10 Essential (primary) hypertension: Secondary | ICD-10-CM

## 2014-01-23 DIAGNOSIS — R0609 Other forms of dyspnea: Secondary | ICD-10-CM

## 2014-01-23 DIAGNOSIS — F32A Depression, unspecified: Secondary | ICD-10-CM

## 2014-01-23 DIAGNOSIS — F419 Anxiety disorder, unspecified: Secondary | ICD-10-CM

## 2014-01-23 DIAGNOSIS — R0989 Other specified symptoms and signs involving the circulatory and respiratory systems: Secondary | ICD-10-CM

## 2014-01-23 DIAGNOSIS — R0600 Dyspnea, unspecified: Secondary | ICD-10-CM

## 2014-01-23 DIAGNOSIS — F341 Dysthymic disorder: Secondary | ICD-10-CM

## 2014-01-23 DIAGNOSIS — R079 Chest pain, unspecified: Secondary | ICD-10-CM

## 2014-01-23 NOTE — Progress Notes (Signed)
Patient ID: Jordan [name] T Fillinger, male   DOB: September 04, 1981, 33 y.o.   MRN: [id number]  33 yo seen in ER 1/22 for chest pain  Pt reports "chest pains all my life off and on" more frequent over the last year. Pt states 1/21  he had a feeling "like someone is sitting on my chest" and sharp, stabbing chest pains radiating down his left arm with sob and nausea.  Pain resolved latter in day , No pain 1/22 but told to go to ER by PCP  at all.  R/O  CXR normal with no CE and ECG with no acute changes.  Indicates pain since age 33  No connective tissue disease.  Activity limited my multiple right knee surgeries post TKR.  Pain increasing in duration and frequency  Sharp quality no pleuritic component Worse with mental or physical stress Marked positive family history premature CAD  Father MI in his 4330's.  Has never had stress test.  Also has exertional dyspnea and fatigue that sounds functional Nonsmoker no asthma or wheezing     ROS: Denies fever, malais, weight loss, blurry vision, decreased visual acuity, cough, sputum, SOB, hemoptysis, pleuritic pain, palpitaitons, heartburn, abdominal pain, melena, lower extremity edema, claudication, or rash.  All other systems reviewed and negative   General: Affect appropriate Obese balck male  HEENT: normal Neck supple with no adenopathy JVP normal no bruits no thyromegaly Lungs clear with no wheezing and good diaphragmatic motion Heart:  S1/S2 no murmur,rub, gallop or click PMI normal Abdomen: benighn, BS positve, no tenderness, no AAA no bruit.  No HSM or HJR Distal pulses intact with no bruits No edema Neuro non-focal Skin warm and dry No muscular weakness  Medications Current Outpatient Prescriptions  Medication Sig Dispense Refill  . aspirin 81 MG tablet Take 81 mg by mouth daily.       No current facility-administered medications for this visit.    Allergies Butrans; Nucynta; and Sumac  Family History: No family history on file.  Social  History: History   Social History  . Marital Status: Married    Spouse Name: N/A    Number of Children: 1  . Years of Education: N/A   Occupational History  .     Social History Main Topics  . Smoking status: Current Every Day Smoker -- 2 years    Types: Cigarettes  . Smokeless tobacco: Never Used  . Alcohol Use: Yes     Comment: occasional  . Drug Use: No  . Sexual Activity: Not on file   Other Topics Concern  . Not on file   Social History Narrative   Caffeine Use:  6 beverages daily   Regular exercise:  2 x weekly          Electrocardiogram:  SR ICRBBB poor R wave progression rate 89  Assessment and Plan

## 2014-01-23 NOTE — Assessment & Plan Note (Signed)
Atypical Family history marked.  Requiring ER visit  F/U lexiscan myovue 2 day  Cannot walk on treadmill

## 2014-01-23 NOTE — Assessment & Plan Note (Signed)
Indicates ER said this was the issue.  Financial and home stress with wife having back issues and he has chronic knee issues

## 2014-01-23 NOTE — Assessment & Plan Note (Signed)
Well controlled.  Continue current medications and low sodium Dash type diet.    

## 2014-01-23 NOTE — Patient Instructions (Signed)
Your physician recommends that you schedule a follow-up appointment in:  AS  NEEDED Your physician recommends that you continue on your current medications as directed. Please refer to the Current Medication list given to you today.  Your physician has requested that you have an echocardiogram. Echocardiography is a painless test that uses sound waves to create images of your heart. It provides your doctor with information about the size and shape of your heart and how well your heart's chambers and valves are working. This procedure takes approximately one hour. There are no restrictions for this procedure.  Your physician has requested that you have a lexiscan myoview. For further information please visit www.[email or web address]. Please follow instruction sheet, as given.  

## 2014-01-30 ENCOUNTER — Ambulatory Visit (HOSPITAL_COMMUNITY): Payer: Medicare HMO | Attending: Cardiology | Admitting: Radiology

## 2014-01-30 DIAGNOSIS — R0989 Other specified symptoms and signs involving the circulatory and respiratory systems: Secondary | ICD-10-CM | POA: Insufficient documentation

## 2014-01-30 DIAGNOSIS — F17200 Nicotine dependence, unspecified, uncomplicated: Secondary | ICD-10-CM | POA: Insufficient documentation

## 2014-01-30 DIAGNOSIS — R0600 Dyspnea, unspecified: Secondary | ICD-10-CM

## 2014-01-30 DIAGNOSIS — R0609 Other forms of dyspnea: Secondary | ICD-10-CM | POA: Insufficient documentation

## 2014-01-30 DIAGNOSIS — R072 Precordial pain: Secondary | ICD-10-CM

## 2014-01-30 DIAGNOSIS — R079 Chest pain, unspecified: Secondary | ICD-10-CM | POA: Insufficient documentation

## 2014-01-30 DIAGNOSIS — I10 Essential (primary) hypertension: Secondary | ICD-10-CM | POA: Insufficient documentation

## 2014-01-30 NOTE — Progress Notes (Signed)
Echocardiogram performed.  

## 2014-02-04 ENCOUNTER — Encounter: Payer: Self-pay | Admitting: *Deleted

## 2014-02-14 ENCOUNTER — Encounter (HOSPITAL_COMMUNITY): Payer: Self-pay

## 2014-02-14 ENCOUNTER — Ambulatory Visit (HOSPITAL_COMMUNITY): Payer: Medicare HMO | Attending: Internal Medicine

## 2014-02-14 NOTE — Progress Notes (Signed)
Patient ID: Jordan [name], male   DOB: October 31, 1981, 33 y.o.   MRN: [id number] Jordan [name] was a no show  for a Cardiolite Study today. Unable to reach the patient due to phone number has been disconnected. Notified Dr. Fabio BeringNishan's nurse of the no show.Irean HongPatsy Ilissa Rosner, RN.

## 2014-10-24 ENCOUNTER — Ambulatory Visit (HOSPITAL_BASED_OUTPATIENT_CLINIC_OR_DEPARTMENT_OTHER)
Admission: RE | Admit: 2014-10-24 | Discharge: 2014-10-24 | Disposition: A | Discharge status: Home / Self Care | Payer: Medicare HMO | Source: Ambulatory Visit | Attending: Family | Admitting: Family

## 2014-10-24 ENCOUNTER — Ambulatory Visit (INDEPENDENT_AMBULATORY_CARE_PROVIDER_SITE_OTHER): Payer: Commercial Managed Care - HMO | Admitting: Family

## 2014-10-24 ENCOUNTER — Encounter: Payer: Self-pay | Admitting: Family

## 2014-10-24 VITALS — BP 125/80 | HR 74 | Temp 98.2°F | Wt 297.0 lb

## 2014-10-24 DIAGNOSIS — M25571 Pain in right ankle and joints of right foot: Secondary | ICD-10-CM

## 2014-10-24 DIAGNOSIS — J209 Acute bronchitis, unspecified: Secondary | ICD-10-CM | POA: Insufficient documentation

## 2014-10-24 DIAGNOSIS — G894 Chronic pain syndrome: Secondary | ICD-10-CM

## 2014-10-24 DIAGNOSIS — G4733 Obstructive sleep apnea (adult) (pediatric): Secondary | ICD-10-CM | POA: Insufficient documentation

## 2014-10-24 DIAGNOSIS — M25471 Effusion, right ankle: Secondary | ICD-10-CM | POA: Diagnosis present

## 2014-10-24 DIAGNOSIS — R0683 Snoring: Secondary | ICD-10-CM

## 2014-10-24 DIAGNOSIS — Z23 Encounter for immunization: Secondary | ICD-10-CM

## 2014-10-24 MED ORDER — AZITHROMYCIN 250 MG PO TABS: ORAL_TABLET | ORAL | Status: DC

## 2014-10-24 NOTE — Assessment & Plan Note (Addendum)
Epsworth sleepiness scale is performed today- pt scored 10/24.  Given history, exam and recent weight gain, suspect OSA. Will refer for split night study.

## 2014-10-24 NOTE — Assessment & Plan Note (Signed)
Will rx with zithromax.   

## 2014-10-24 NOTE — Assessment & Plan Note (Signed)
X ray is performed of the right ankle, notes arthritic changes. Pt is interested in aquatic therapy. Rx was given to pt for aquatic therapy.

## 2014-10-24 NOTE — Progress Notes (Signed)
   Subjective:    Patient ID: Jordan [name], male    DOB: January 11, 1981, 33 y.o.   MRN: [id number]  HPI  Mr. Jordan [name] is a 33 yr old male who presents today with chief complaint of wheezing.  Reports that sob and wheezing has worsened x 3 weeks. Wife notes loud snoring and periods of apnea when sleeping.  Reports + daytime somnolence. He has had some AM coughing, productive of green/yellow phelgm for "a few weeks maybe longer."    Chronic right ankle/right hip pain.  Reports that he uses voltaren gel, meditation.  Ankle pain is worse than it has been the hip and back pain are unchanged mostly.     Wt Readings from Last 3 Encounters:  10/24/14 297 lb (134.718 kg)  01/23/14 278 lb (126.1 kg)  12/02/13 275 lb (124.739 kg)    Review of Systems See HPI  Past Medical History  Diagnosis Date  . HYPERTENSION, MILD   . Anxiety and depression   . Chronic pain syndrome   . General medical examination   . OSTEOARTHRITIS     History   Social History  . Marital Status: Married    Spouse Name: N/A    Number of Children: 1  . Years of Education: N/A   Occupational History  .     Social History Main Topics  . Smoking status: Current Every Day Smoker -- 2 years    Types: Cigarettes  . Smokeless tobacco: Never Used  . Alcohol Use: Yes     Comment: occasional  . Drug Use: No  . Sexual Activity: Not on file   Other Topics Concern  . Not on file   Social History Narrative   Caffeine Use:  6 beverages daily   Regular exercise:  2 x weekly          Past Surgical History  Procedure Laterality Date  . Knee surgery Right     No family history on file.  Allergies  Allergen Reactions  . Butrans [Buprenorphine Hcl]     Difficulty breathing, n/v  . Nucynta [Tapentadol Hydrochloride] Anaphylaxis  . Sumac     poison    Current Outpatient Prescriptions on File Prior to Visit  Medication Sig Dispense Refill  . aspirin 81 MG tablet Take 81 mg by mouth daily.       No  current facility-administered medications on file prior to visit.    BP 125/80  Pulse 74  Temp(Src) 98.2 F (36.8 C)  Wt 297 lb (134.718 kg)  SpO2 94%       Objective:   Physical Exam  Constitutional: He is oriented to person, place, and time. He appears well-developed and well-nourished. No distress.  HENT:  Head: Normocephalic and atraumatic.  2-3+ tonsils bilaterally, very narrow oropharynx.    Cardiovascular: Normal rate and regular rhythm.   No murmur heard. Pulmonary/Chest: Effort normal and breath sounds normal. No respiratory distress. He has no wheezes. He has no rales. He exhibits no tenderness.  Neurological: He is alert and oriented to person, place, and time.  Skin: Skin is warm and dry.  Psychiatric: He has a normal mood and affect. His behavior is normal. Judgment and thought content normal.          Assessment & Plan:

## 2014-10-24 NOTE — Progress Notes (Signed)
Pre visit review using our clinic review tool, if applicable. No additional management support is needed unless otherwise documented below in the visit note. 

## 2014-10-24 NOTE — Patient Instructions (Signed)
Complete x ray on the first floor. Start zpak for bronchitis. You will be contacted about your referral for sleep study. Please contact aquatics center to initiate aquatic therapy (see hand written order). Follow up in 2 months.  Call sooner if symptoms worsen or if symptoms do not improve.

## 2014-10-25 ENCOUNTER — Telehealth: Payer: Self-pay | Admitting: Family

## 2014-10-25 NOTE — Telephone Encounter (Signed)
emmi mailed  °

## 2014-12-01 ENCOUNTER — Ambulatory Visit (HOSPITAL_BASED_OUTPATIENT_CLINIC_OR_DEPARTMENT_OTHER): Payer: Commercial Managed Care - HMO | Attending: Family

## 2014-12-01 VITALS — Ht 66.0 in | Wt 300.0 lb

## 2014-12-01 DIAGNOSIS — G4710 Hypersomnia, unspecified: Secondary | ICD-10-CM | POA: Insufficient documentation

## 2014-12-01 DIAGNOSIS — R0683 Snoring: Secondary | ICD-10-CM

## 2014-12-01 DIAGNOSIS — G4730 Sleep apnea, unspecified: Secondary | ICD-10-CM | POA: Insufficient documentation

## 2014-12-01 DIAGNOSIS — Z9989 Dependence on other enabling machines and devices: Secondary | ICD-10-CM

## 2014-12-01 DIAGNOSIS — G4733 Obstructive sleep apnea (adult) (pediatric): Secondary | ICD-10-CM

## 2014-12-01 DIAGNOSIS — Z6841 Body Mass Index (BMI) 40.0 and over, adult: Secondary | ICD-10-CM | POA: Insufficient documentation

## 2014-12-03 ENCOUNTER — Telehealth: Payer: Self-pay | Admitting: Family

## 2014-12-03 DIAGNOSIS — G4730 Sleep apnea, unspecified: Secondary | ICD-10-CM

## 2014-12-03 NOTE — Sleep Study (Signed)
   NAME: Jordan [name] DATE OF BIRTH:  11-16-1981 MEDICAL RECORD NUMBER [id number]  LOCATION: [facility]  PHYSICIAN: Barbaraann ShareCLANCE,KEITH M  DATE OF STUDY: 12/01/2014  SLEEP STUDY TYPE: Nocturnal Polysomnogram               REFERRING PHYSICIAN: Sandford Craze'Sullivan, [name], NP  INDICATION FOR STUDY: Hypersomnia with sleep apnea  EPWORTH SLEEPINESS SCORE:  12 HEIGHT: 5\' 6"  (167.6 cm)  WEIGHT: 300 lb (136.079 kg)    Body mass index is 48.44 kg/(m^2).  NECK SIZE: 18 in.  MEDICATIONS: Reviewed in the sleep record  SLEEP ARCHITECTURE: The patient had a total sleep time of 352 minutes with very little slow-wave sleep and decreased quantity of REM. Sleep onset latency was normal at 5 minutes, and REM onset was normal at 68 minutes. Sleep efficiency was 92% during the diagnostic portion of the study and 81% during the titration portion.  RESPIRATORY DATA: The patient underwent a split night protocol where he was found to have 82 obstructive events in the first 212 minutes of sleep. His gave him an AHI of 23 events per hour during the diagnostic portion of the study. The events occurred in all body positions, and there was very loud snoring noted throughout. The patient was then fitted with a small Fisher Paykel simplus full face mask, and titrated to a final pressure of 11 cm of water. There was good control of his obstructive events on this pressure, but he did have some breakthrough snoring. It should also be noted that he did not achieve supine sleep on the final setting.  OXYGEN DATA: There was transient oxygen desaturation as low as 84% with the patient's obstructive events  CARDIAC DATA: PVCs noted throughout  MOVEMENT/PARASOMNIA: No significant periodic limb movements or other abnormal behaviors were seen.  IMPRESSION/ RECOMMENDATION:    1) split-night study reveals moderate obstructive sleep apnea, with an AHI of 23 events per hour and oxygen desaturation as low as 84%  during the diagnostic portion of the study. Patient was then fitted with a small Fisher Paykel Simplus full face mask, and found to have an optimal pressure around 11 cm of water. It should be noted that he had some breakthrough snoring, and did not achieve supine sleep on the final setting. His pressure may need to be further optimized at home on the automatic mode of C Pap. He should also be encouraged to work aggressively on weight loss.  2) PVCs noted throughout the night.     Barbaraann ShareLANCE,KEITH M Diplomate, American Board of Sleep Medicine  ELECTRONICALLY SIGNED ON:  12/03/2014, 2:08 PM [facility] PH: [phone]   FX: [phone] ACCREDITED BY THE AMERICAN ACADEMY OF SLEEP MEDICINE

## 2014-12-03 NOTE — Telephone Encounter (Signed)
Opened in error

## 2014-12-06 ENCOUNTER — Other Ambulatory Visit: Payer: Self-pay | Admitting: Family

## 2014-12-06 ENCOUNTER — Telehealth: Payer: Self-pay | Admitting: Family

## 2014-12-06 DIAGNOSIS — G4733 Obstructive sleep apnea (adult) (pediatric): Secondary | ICD-10-CM

## 2014-12-06 DIAGNOSIS — R0683 Snoring: Secondary | ICD-10-CM

## 2014-12-06 NOTE — Telephone Encounter (Signed)
Notified pt and he voices understanding and is agreeable to proceed with CPAP. Pt requests expedited referral as his insurance may terminate at the end of this month.  [facility]

## 2014-12-06 NOTE — Telephone Encounter (Signed)
Pt requests refills of limbrel and metanx.  Please advise.

## 2014-12-06 NOTE — Telephone Encounter (Signed)
Pls advise pt that sleep study shows moderate sleep apnea.  I will arrange home cpap. He should see me back in the office 6 weeks after starting cpap. Do not drive if sleepy.

## 2014-12-08 NOTE — Telephone Encounter (Signed)
OK to send refill. Please clarify how many times a day he is taking each. thanks

## 2014-12-09 MED ORDER — METANX 3-90.314-2-35 MG PO CAPS: 1.0000 | ORAL_CAPSULE | Freq: Two times a day (BID) | ORAL | Status: DC

## 2014-12-09 MED ORDER — FLAVOCOXID 500 MG PO CAPS: 1.0000 | ORAL_CAPSULE | Freq: Two times a day (BID) | ORAL | Status: DC

## 2014-12-09 NOTE — Telephone Encounter (Signed)
Spoke with pt and he states he takes both medications twice a day. Med list updated and refill sent to pharmacy.

## 2014-12-10 NOTE — Telephone Encounter (Signed)
Referral faxed to [facility], they will contact pt directly.

## 2014-12-12 ENCOUNTER — Telehealth: Payer: Self-pay | Admitting: *Deleted

## 2014-12-12 NOTE — Telephone Encounter (Signed)
Received medical record request via fax from [employer]. [facility] JG//CMA

## 2014-12-13 ENCOUNTER — Telehealth: Payer: Self-pay | Admitting: Family

## 2014-12-13 NOTE — Telephone Encounter (Signed)
Pt spouse is requesting a c pap machine for husband. Please advise

## 2014-12-16 ENCOUNTER — Telehealth: Payer: Self-pay | Admitting: *Deleted

## 2014-12-16 ENCOUNTER — Encounter: Payer: Self-pay | Admitting: Family

## 2014-12-16 ENCOUNTER — Ambulatory Visit: Payer: Commercial Managed Care - HMO | Admitting: Family

## 2014-12-16 NOTE — Telephone Encounter (Signed)
Please send no show letter

## 2014-12-16 NOTE — Telephone Encounter (Signed)
CPAP has already been initiated. Awaiting home health to contact pt. See 12/06/14 phone note.

## 2014-12-16 NOTE — Telephone Encounter (Signed)
Letter send 12/16/2014

## 2014-12-16 NOTE — Telephone Encounter (Signed)
Pt did not show for appointment 12/16/2014 at 7:45 am for 2 month follow up

## 2014-12-17 ENCOUNTER — Ambulatory Visit (INDEPENDENT_AMBULATORY_CARE_PROVIDER_SITE_OTHER): Payer: Commercial Managed Care - HMO | Admitting: Pulmonary Disease

## 2014-12-17 ENCOUNTER — Encounter: Payer: Self-pay | Admitting: Pulmonary Disease

## 2014-12-17 VITALS — BP 126/82 | HR 76 | Temp 97.1°F | Ht 66.0 in | Wt 302.0 lb

## 2014-12-17 DIAGNOSIS — G4733 Obstructive sleep apnea (adult) (pediatric): Secondary | ICD-10-CM

## 2014-12-17 DIAGNOSIS — R0683 Snoring: Secondary | ICD-10-CM

## 2014-12-17 NOTE — Patient Instructions (Signed)
Will get you started on cpap at a moderate pressure level.  Please call if having issues with tolerance Work on weight loss followup with me again in 8 weeks.

## 2014-12-17 NOTE — Assessment & Plan Note (Signed)
The patient has moderate obstructive sleep apnea based on his recent sleep study, and is clearly symptomatic both during the day and at night while sleeping. I have had a long discussion with him about the pathophysiology of sleep apnea, including its impact to his quality of life and cardiovascular health. Given the severity of his sleep apnea, I would recommend starting on C Pap while working on weight loss. The patient is agreeable to this approach. I will set the patient up on cpap at a moderate pressure level to allow for desensitization, and will troubleshoot the device over the next 4-6weeks if needed.  The pt is to call me if having issues with tolerance.  Will then optimize the pressure once patient is able to wear cpap on a consistent basis.

## 2014-12-17 NOTE — Progress Notes (Signed)
Subjective:    Patient ID: Juanda ChanceFleming T Yo, male    DOB: 08-24-1981, 33 y.o.   MRN: [id number]  HPI The patient is a 33 year old male who I've been asked to see for management of obstructive sleep apnea. He has had a recent sleep study where he was found to have moderate OSA, with an AHI of 23 events per hour. He was then started on C Pap, and titrated to a pressure of 11 cm of water. He is been noted to have very loud snoring, as well as an abnormal breathing pattern during sleep. He has frequent awakenings at night, and is not rested in the mornings upon arising. He notes significant sleepiness during the day with periods of inactivity, and will also fall asleep in the evening watching television. He denies any sleepiness driving except with long distances. The patient states that his weight is up 40 pounds over the last 2 years, and his Epworth score today is 12.   Sleep Questionnaire What time do you typically go to bed?( Between what hours) 10pm - 2am 10pm - 2am at 1537 on 12/17/14 by Karleen HampshireMichelle P Jesiah Yerby, CMA How long does it take you to fall asleep? 1 - 2 hours 1 - 2 hours at 1537 on 12/17/14 by Karleen HampshireMichelle P Dade Rodin, CMA How many times during the night do you wake up? 4 4 at 1537 on 12/17/14 by Karleen HampshireMichelle P Tiajuana Leppanen, CMA What time do you get out of bed to start your day? 0600 0600 at 1537 on 12/17/14 by Karleen HampshireMichelle P Latoia Eyster, CMA Do you drive or operate heavy machinery in your occupation? No No at 1537 on 12/17/14 by Karleen HampshireMichelle P Ulis Kaps, CMA How much has your weight changed (up or down) over the past two years? (In pounds) 40 lb (18.144 kg) 40 lb (18.144 kg) at 1537 on 12/17/14 by Karleen HampshireMichelle P [place], CMA Have you ever had a sleep study before? Yes Yes at 1537 on 12/17/14 by Karleen HampshireMichelle P Lolamae Voisin, CMA If yes, location of study? If yes, date of study? 12/01/2014 12/01/2014 at 1537 on 12/17/14 by Karleen HampshireMichelle P Crystalyn Delia, CMA Do you currently use CPAP? No No at 1537 on 12/17/14 by Karleen HampshireMichelle P Avion Patella, CMA Do you wear oxygen at  any time? No No at 1537 on 12/17/14 by Karleen HampshireMichelle P Meghan Tiemann, CMA   Review of Systems  Constitutional: Positive for fatigue. Negative for fever and unexpected weight change.  HENT: Negative for congestion, dental problem, ear pain, nosebleeds, postnasal drip, rhinorrhea, sinus pressure, sneezing, sore throat and trouble swallowing.   Eyes: Negative for redness and itching.  Respiratory: Positive for apnea, cough, chest tightness, shortness of breath and wheezing.   Cardiovascular: Negative for palpitations and leg swelling.  Gastrointestinal: Negative for nausea and vomiting.  Genitourinary: Negative for dysuria.  Musculoskeletal: Negative for joint swelling.  Skin: Negative for rash.  Neurological: Negative for headaches.  Hematological: Does not bruise/bleed easily.  Psychiatric/Behavioral: Negative for dysphoric mood. The patient is not nervous/anxious.        Objective:   Physical Exam Constitutional:  Morbidly obese male, no acute distress  HENT:  Nares patent without discharge, large turbinates, deviated septum  Oropharynx without exudate, palate and uvula are elongated, moderately prominent tonsils.   Eyes:  Perrla, eomi, no scleral icterus  Neck:  No JVD, no TMG  Cardiovascular:  Normal rate, regular rhythm, no rubs or gallops.  No murmurs        Intact distal pulses  Pulmonary :  Normal breath sounds, no  stridor or respiratory distress   No rales, rhonchi, or wheezing  Abdominal:  Soft, nondistended, bowel sounds present.  No tenderness noted.   Musculoskeletal:  miild lower extremity edema noted.  Lymph Nodes:  No cervical lymphadenopathy noted  Skin:  No cyanosis noted  Neurologic:  Alert, appropriate, moves all 4 extremities without obvious deficit.         Assessment & Plan:

## 2015-01-09 ENCOUNTER — Encounter (HOSPITAL_BASED_OUTPATIENT_CLINIC_OR_DEPARTMENT_OTHER): Payer: Self-pay

## 2015-01-16 ENCOUNTER — Ambulatory Visit: Payer: Self-pay | Admitting: Family

## 2015-01-24 DIAGNOSIS — G4733 Obstructive sleep apnea (adult) (pediatric): Secondary | ICD-10-CM | POA: Diagnosis not present

## 2015-02-03 ENCOUNTER — Ambulatory Visit (INDEPENDENT_AMBULATORY_CARE_PROVIDER_SITE_OTHER): Payer: Self-pay | Admitting: Family

## 2015-02-03 ENCOUNTER — Encounter: Payer: Self-pay | Admitting: Family

## 2015-02-03 VITALS — BP 130/86 | HR 81 | Temp 97.4°F | Resp 18 | Wt 298.4 lb

## 2015-02-03 DIAGNOSIS — F419 Anxiety disorder, unspecified: Secondary | ICD-10-CM

## 2015-02-03 DIAGNOSIS — M19171 Post-traumatic osteoarthritis, right ankle and foot: Secondary | ICD-10-CM

## 2015-02-03 DIAGNOSIS — F329 Major depressive disorder, single episode, unspecified: Secondary | ICD-10-CM

## 2015-02-03 DIAGNOSIS — M25571 Pain in right ankle and joints of right foot: Secondary | ICD-10-CM

## 2015-02-03 DIAGNOSIS — G4733 Obstructive sleep apnea (adult) (pediatric): Secondary | ICD-10-CM

## 2015-02-03 DIAGNOSIS — F418 Other specified anxiety disorders: Secondary | ICD-10-CM

## 2015-02-03 MED ORDER — CITALOPRAM HYDROBROMIDE 20 MG PO TABS: ORAL_TABLET | ORAL | Status: DC

## 2015-02-03 NOTE — Progress Notes (Signed)
Subjective:    Patient ID: Jordan [name], male    DOB: 1981/01/12, 34 y.o.   MRN: [id number]  HPI Jordan [name] is here today for follow up after sleep apnea. He had a sleep study the beginning of December and was found to have moderate OSA. He received his CPAP the end of December. He is using CPAP nightly for at least 4 hours. Reports feeling more well-rested at first but now he he feels like he is not sleeping well due to the loss of health insurance and pain in right knee/ankle.  He has a follow up appt. with Dr. Shelle Ironlance on 2/16. No longer going to pain management because he doesn't like taking the pain meds. He has an appeal in for loss of Medicare.  Obesity- He is trying to lose weight. He is doing water exercise and recumbent bike. He is also reducing calories.  Wt Readings from Last 3 Encounters:  02/03/15 298 lb 6 oz (135.342 kg)  12/17/14 302 lb (136.986 kg)  12/01/14 300 lb (136.079 kg)   Reports depression/anxiety. Reports it is "nerve-wracking" for him to be around other people in public. He is also easily agitated. Seeing psychologist later this month. Has taken Cymbalta in past and felt that he couldn't concentrate.   He has pain (5-6/10)  in right ankle which he has had since  A work accident since 2003. Reports reduced mobility in right foot has worsened over time and he would like referral to ortho for this. NSAIDS and tylenol do not help pain.   Review of Systems See HPI    Past Medical History  Diagnosis Date  . HYPERTENSION, MILD   . Anxiety and depression   . Chronic pain syndrome   . General medical examination   . OSTEOARTHRITIS     History   Social History  . Marital Status: Married    Spouse Name: N/A    Number of Children: 2  . Years of Education: N/A   Occupational History  .     Social History Main Topics  . Smoking status: Former Smoker -- 2 years    Types: Cigarettes  . Smokeless tobacco: Former NeurosurgeonUser    Quit date: 01/18/2014  .  Alcohol Use: 0.0 oz/week    0 Not specified per week     Comment: occasional  . Drug Use: No  . Sexual Activity: Not on file   Other Topics Concern  . Not on file   Social History Narrative   Caffeine Use:  6 beverages daily   Regular exercise:  2 x weekly          Past Surgical History  Procedure Laterality Date  . Knee surgery Right     Family History  Problem Relation Age of Onset  . Heart disease Father   . Heart disease Paternal Grandfather   . Heart disease Paternal Grandmother   . Heart disease Paternal Uncle   . Diabetes Paternal Uncle   . Diabetes Paternal Aunt   . Hypertension Maternal Uncle   . Cancer Maternal Aunt     lung  . Cancer Maternal Uncle     lung    Allergies  Allergen Reactions  . Butrans [Buprenorphine Hcl]     Difficulty breathing, n/v  . Nucynta [Tapentadol Hydrochloride] Anaphylaxis  . Sumac     poison    Current Outpatient Prescriptions on File Prior to Visit  Medication Sig Dispense Refill  . aspirin 81 MG tablet Take 81  mg by mouth daily.    . diclofenac sodium (VOLTAREN) 1 % GEL Apply topically 4 (four) times daily.    . Flavocoxid (LIMBREL) 500 MG CAPS Take 1 capsule (500 mg total) by mouth 2 (two) times daily. 60 capsule 2  . L-Methylfolate-Algae-B12-B6 (METANX) 3-90.314-2-35 MG CAPS Take 1 capsule by mouth 2 (two) times daily. 60 capsule 2   No current facility-administered medications on file prior to visit.    BP 130/86 mmHg  Pulse 81  Temp(Src) 97.4 F (36.3 C) (Oral)  Resp 18  Wt 298 lb 6 oz (135.342 kg)  SpO2 98%    Objective:   Physical Exam  Constitutional: He is oriented to person, place, and time. He appears well-developed and well-nourished. No distress.  HENT:  Head: Normocephalic and atraumatic.  Cardiovascular: Normal rate and regular rhythm.   No murmur heard. Pulmonary/Chest: Effort normal and breath sounds normal. No respiratory distress. He has no wheezes. He has no rales.  Neurological: He is  alert and oriented to person, place, and time.  Skin: Skin is warm and dry.  Psychiatric: He has a normal mood and affect. His behavior is normal. Thought content normal.          Assessment & Plan:

## 2015-02-03 NOTE — Patient Instructions (Signed)
Start citalopram 20mg  (1/2 tab by mouth once daily on week two). You will be contacted about your referral to orthopedics. Keep your upcoming appointment with Dr. Shelle Ironlance. Follow up in 1 month.

## 2015-02-03 NOTE — Progress Notes (Signed)
Pre visit review using our clinic review tool, if applicable. No additional management support is needed unless otherwise documented below in the visit note. 

## 2015-02-06 DIAGNOSIS — E66813 Obesity, class 3: Secondary | ICD-10-CM | POA: Insufficient documentation

## 2015-02-06 MED ORDER — METANX 3-90.314-2-35 MG PO CAPS: 1.0000 | ORAL_CAPSULE | Freq: Two times a day (BID) | ORAL | Status: DC

## 2015-02-06 MED ORDER — FLAVOCOXID 500 MG PO CAPS: 1.0000 | ORAL_CAPSULE | Freq: Two times a day (BID) | ORAL | Status: DC

## 2015-02-06 NOTE — Assessment & Plan Note (Signed)
Refer to ortho for ongoing management of ankle pain.

## 2015-02-06 NOTE — Assessment & Plan Note (Signed)
Deteriorated.  Trial of citalopram 20mg . instructed pt to start 1/2 tablet once daily for 1 week and then increase to a full tablet once daily on week two as tolerated.  We discussed common side effects such as nausea, drowsiness and weight gain.  Also discussed rare but serious side effect of suicide ideation.  She is instructed to discontinue medication go directly to ED if this occurs.  Pt verbalizes understanding.  Plan follow up in 1 month to evaluate progress.

## 2015-02-06 NOTE — Assessment & Plan Note (Signed)
We discussed diet/exercise, weight loss.

## 2015-02-06 NOTE — Assessment & Plan Note (Signed)
Stable on CPAP, keep upcoming apt with Dr. Shelle Ironlance.

## 2015-02-11 ENCOUNTER — Ambulatory Visit: Payer: Self-pay | Admitting: Pulmonary Disease

## 2015-02-24 DIAGNOSIS — G4733 Obstructive sleep apnea (adult) (pediatric): Secondary | ICD-10-CM | POA: Diagnosis not present

## 2015-02-25 ENCOUNTER — Encounter: Payer: Self-pay | Admitting: Pulmonary Disease

## 2015-02-25 ENCOUNTER — Ambulatory Visit (INDEPENDENT_AMBULATORY_CARE_PROVIDER_SITE_OTHER): Payer: Self-pay | Admitting: Pulmonary Disease

## 2015-02-25 VITALS — BP 134/76 | HR 70 | Temp 97.0°F | Ht 66.0 in | Wt 301.8 lb

## 2015-02-25 DIAGNOSIS — G4733 Obstructive sleep apnea (adult) (pediatric): Secondary | ICD-10-CM

## 2015-02-25 NOTE — Assessment & Plan Note (Signed)
The patient overall is doing fairly well with his C Pap in the first 8 weeks. He is wearing the device compliantly by his download, and has excellent control of his AHI with no significant mask leaks. He is having some issues with mask comfort, and I've asked him to work with his home care company on fit. He also thinks that his recent bouts of recurrent sinusitis are related to starting C Pap, and I have asked him to work on improving his humidity through the overall system during sleep. He tells me that he is keeping his mask and tubing completely clean. Finally, I have encouraged him to work aggressively on weight loss.

## 2015-02-25 NOTE — Patient Instructions (Signed)
Continue on cpap, and try increasing the heat on your humidifier to increase the moisture level to see if this will help with your sinus issues. Work with your homecare company on mask fit.  If you continue to have issues, we can refer you for a formal fitting at the sleep center during the day. Work on weight loss followup with me again in 6mos.

## 2015-02-25 NOTE — Progress Notes (Signed)
   Subjective:    Patient ID: Jordan [name], male    DOB: [birth year], 34 y.o.   MRN: [id number]  HPI The patient comes in today for follow-up of his obstructive sleep apnea. He was started on C Pap at his last visit, and has been wearing the device fairly compliantly by his download. He has excellent control of his AHI, with no significant mask leak. He has seen a definite improvement in his sleep and daytime alertness. His only complaint today is that of recurring sinusitis, and feels that it is related to the C Pap. He is keeping his mask and machine clean, and uses a full face mask. He is using heated humidity, but this setting is not overly high. I wonder if his air is too dry.   Review of Systems  Constitutional: Negative for fever and unexpected weight change.  HENT: Positive for congestion, postnasal drip and rhinorrhea. Negative for dental problem, ear pain, nosebleeds, sinus pressure, sneezing, sore throat and trouble swallowing.   Eyes: Negative for redness and itching.  Respiratory: Positive for cough. Negative for chest tightness, shortness of breath and wheezing.   Cardiovascular: Negative for palpitations and leg swelling.  Gastrointestinal: Negative for nausea and vomiting.  Genitourinary: Negative for dysuria.  Musculoskeletal: Negative for joint swelling.  Skin: Negative for rash.  Neurological: Positive for headaches.  Hematological: Does not bruise/bleed easily.  Psychiatric/Behavioral: Negative for dysphoric mood. The patient is not nervous/anxious.        Objective:   Physical Exam Obese male in no acute distress Nose without purulence or discharge noted No skin breakdown or pressure necrosis from the C Pap mask Neck without lymphadenopathy or thyromegaly Lower extremities with mild edema, no cyanosis Alert and oriented, moves all 4 extremities.       Assessment & Plan:

## 2015-03-04 ENCOUNTER — Ambulatory Visit: Payer: Self-pay | Admitting: Family

## 2015-03-05 ENCOUNTER — Ambulatory Visit (INDEPENDENT_AMBULATORY_CARE_PROVIDER_SITE_OTHER): Payer: Commercial Managed Care - HMO | Admitting: Family

## 2015-03-05 ENCOUNTER — Encounter: Payer: Self-pay | Admitting: Family

## 2015-03-05 VITALS — BP 132/82 | HR 67 | Temp 97.8°F | Resp 18 | Ht 66.0 in | Wt 302.0 lb

## 2015-03-05 DIAGNOSIS — F419 Anxiety disorder, unspecified: Principal | ICD-10-CM

## 2015-03-05 DIAGNOSIS — F418 Other specified anxiety disorders: Secondary | ICD-10-CM | POA: Diagnosis not present

## 2015-03-05 DIAGNOSIS — F329 Major depressive disorder, single episode, unspecified: Secondary | ICD-10-CM

## 2015-03-05 MED ORDER — METANX 3-90.314-2-35 MG PO CAPS: 1.0000 | ORAL_CAPSULE | Freq: Two times a day (BID) | ORAL | Status: DC

## 2015-03-05 MED ORDER — VENLAFAXINE HCL ER 37.5 MG PO CP24: ORAL_CAPSULE | ORAL | Status: DC

## 2015-03-05 MED ORDER — CETIRIZINE HCL 10 MG PO TABS: 10.0000 mg | ORAL_TABLET | Freq: Every day | ORAL | Status: DC

## 2015-03-05 NOTE — Progress Notes (Signed)
Pre visit review using our clinic review tool, if applicable. No additional management support is needed unless otherwise documented below in the visit note. 

## 2015-03-05 NOTE — Patient Instructions (Signed)
Cut citalopram in half and continue for 1 week then stop. Start effexor, one tablet by mouth daily for 3 days, then increase to a full tab once daily on day four. Please schedule a follow up appointment in 1 month.

## 2015-03-05 NOTE — Assessment & Plan Note (Signed)
Uncontrolled, not tolerating citalopram. D/c citalopram, start effexor.

## 2015-03-05 NOTE — Progress Notes (Signed)
Subjective:    Patient ID: Jordan [name], male    DOB: [birth year], 34 y.o.   MRN: [id number]  HPI  Anxiety- reports decreased libido since starting citalopram.  Reports panic attacks once a month. Overall, feels down and depressed. Avoiding social settings and crowds because he knows that this will trigger anxiety. He is trying to get it improved to go to psychiatry through [employer].  Denies SI/HI  OSA- reports using cpap every night. Feels more refreshed in the AM.  Reports that he has not heard back on ortho referral from last visit.    Review of Systems    see HPI  Past Medical History  Diagnosis Date  . HYPERTENSION, MILD   . Anxiety and depression   . Chronic pain syndrome   . General medical examination   . OSTEOARTHRITIS     History   Social History  . Marital Status: Married    Spouse Name: N/A  . Number of Children: 2  . Years of Education: N/A   Occupational History  .     Social History Main Topics  . Smoking status: Former Smoker -- 2 years    Types: Cigarettes  . Smokeless tobacco: Former NeurosurgeonUser    Quit date: 01/18/2014  . Alcohol Use: 0.0 oz/week    0 Standard drinks or equivalent per week     Comment: occasional  . Drug Use: No  . Sexual Activity: Not on file   Other Topics Concern  . Not on file   Social History Narrative   Caffeine Use:  6 beverages daily   Regular exercise:  2 x weekly          Past Surgical History  Procedure Laterality Date  . Knee surgery Right     Family History  Problem Relation Age of Onset  . Heart disease Father   . Heart disease Paternal Grandfather   . Heart disease Paternal Grandmother   . Heart disease Paternal Uncle   . Diabetes Paternal Uncle   . Diabetes Paternal Aunt   . Hypertension Maternal Uncle   . Cancer Maternal Aunt     lung  . Cancer Maternal Uncle     lung    Allergies  Allergen Reactions  . Butrans [Buprenorphine Hcl]     Difficulty breathing, n/v  . Nucynta  [Tapentadol Hydrochloride] Anaphylaxis  . Sumac     poison    Current Outpatient Prescriptions on File Prior to Visit  Medication Sig Dispense Refill  . aspirin 81 MG tablet Take 81 mg by mouth daily.    . cetirizine (ZYRTEC) 10 MG tablet Take 10 mg by mouth daily.    . citalopram (CELEXA) 20 MG tablet 1/2 tab by mouth once daily for 1 week, then increase to a full tab on week two. 30 tablet 0  . diclofenac sodium (VOLTAREN) 1 % GEL Apply topically 4 (four) times daily.    . Flavocoxid (LIMBREL) 500 MG CAPS Take 1 capsule (500 mg total) by mouth 2 (two) times daily. 60 capsule 5  . L-Methylfolate-Algae-B12-B6 (METANX) 3-90.314-2-35 MG CAPS Take 1 capsule by mouth 2 (two) times daily. 60 capsule 5  . pseudoephedrine (SUDAFED) 30 MG tablet Take 30 mg by mouth every 4 (four) hours as needed for congestion.     No current facility-administered medications on file prior to visit.    BP 132/82 mmHg  Pulse 67  Temp(Src) 97.8 F (36.6 C) (Oral)  Resp 18  Ht 5\' 6"  (  1.676 m)  Wt 302 lb (136.986 kg)  BMI 48.77 kg/m2  SpO2 99%    Objective:   Physical Exam  Constitutional: He is oriented to person, place, and time. He appears well-developed and well-nourished.  Neurological: He is alert and oriented to person, place, and time.  Psychiatric: His behavior is normal. Judgment and thought content normal.  Mildly anxious appearing male, pleasant          Assessment & Plan:

## 2015-03-06 ENCOUNTER — Telehealth: Payer: Self-pay | Admitting: *Deleted

## 2015-03-06 NOTE — Telephone Encounter (Signed)
Medical record request received via mail from [employer]. Forwarded to [email or web address] for scan/fax to medical records. JG//CMA

## 2015-03-10 ENCOUNTER — Telehealth: Payer: Self-pay | Admitting: *Deleted

## 2015-03-10 NOTE — Telephone Encounter (Signed)
Received notice that venlafaxine twice a day tablets will require prior authorization. PA submitted and is pending approval / denial status.

## 2015-03-11 MED ORDER — VENLAFAXINE HCL ER 37.5 MG PO CP24: 37.5000 mg | ORAL_CAPSULE | Freq: Every day | ORAL | Status: DC

## 2015-03-11 NOTE — Telephone Encounter (Signed)
PA denied. Insurance states they will cover Venlafaxine ER 37.5 mg (30 per 30 days), 75 mg (90 per 30 days) or 150 mg (60 per 30 days). Please advise. JG//CMA

## 2015-03-11 NOTE — Telephone Encounter (Signed)
Notified pt. 

## 2015-03-11 NOTE — Telephone Encounter (Signed)
Please let pt know that I would like him to start effexor 37.5 xr  One tab once daily. We can increase if necessary at his follow up visit.

## 2015-03-25 DIAGNOSIS — G4733 Obstructive sleep apnea (adult) (pediatric): Secondary | ICD-10-CM | POA: Diagnosis not present

## 2015-04-04 ENCOUNTER — Ambulatory Visit: Payer: Commercial Managed Care - HMO | Admitting: Family

## 2015-04-04 DIAGNOSIS — Z0289 Encounter for other administrative examinations: Secondary | ICD-10-CM

## 2015-04-11 ENCOUNTER — Telehealth: Payer: Self-pay | Admitting: Family

## 2015-04-11 ENCOUNTER — Encounter: Payer: Self-pay | Admitting: Family

## 2015-04-11 NOTE — Telephone Encounter (Signed)
Yes please

## 2015-04-11 NOTE — Telephone Encounter (Signed)
Pt was no show for follow up on 04/04/15- letter sent- charge? Last no show was 11/2014

## 2015-04-25 DIAGNOSIS — G4733 Obstructive sleep apnea (adult) (pediatric): Secondary | ICD-10-CM | POA: Diagnosis not present

## 2015-05-05 DIAGNOSIS — G4733 Obstructive sleep apnea (adult) (pediatric): Secondary | ICD-10-CM | POA: Diagnosis not present

## 2015-05-06 ENCOUNTER — Other Ambulatory Visit: Payer: Self-pay | Admitting: *Deleted

## 2015-05-25 DIAGNOSIS — G4733 Obstructive sleep apnea (adult) (pediatric): Secondary | ICD-10-CM | POA: Diagnosis not present

## 2015-05-30 ENCOUNTER — Telehealth: Payer: Self-pay | Admitting: Family

## 2015-05-30 NOTE — Telephone Encounter (Signed)
Pre visit letter sent  °

## 2015-06-04 ENCOUNTER — Ambulatory Visit (INDEPENDENT_AMBULATORY_CARE_PROVIDER_SITE_OTHER): Payer: Commercial Managed Care - HMO | Admitting: Medical

## 2015-06-04 ENCOUNTER — Encounter: Payer: Self-pay | Admitting: Medical

## 2015-06-04 VITALS — BP 143/80 | HR 56 | Temp 98.7°F | Ht 66.0 in | Wt 310.0 lb

## 2015-06-04 DIAGNOSIS — M25561 Pain in right knee: Secondary | ICD-10-CM | POA: Diagnosis not present

## 2015-06-04 DIAGNOSIS — G8929 Other chronic pain: Secondary | ICD-10-CM

## 2015-06-04 DIAGNOSIS — K219 Gastro-esophageal reflux disease without esophagitis: Secondary | ICD-10-CM

## 2015-06-04 DIAGNOSIS — R110 Nausea: Secondary | ICD-10-CM | POA: Diagnosis not present

## 2015-06-04 DIAGNOSIS — H6500 Acute serous otitis media, unspecified ear: Secondary | ICD-10-CM

## 2015-06-04 DIAGNOSIS — R197 Diarrhea, unspecified: Secondary | ICD-10-CM | POA: Diagnosis not present

## 2015-06-04 DIAGNOSIS — R112 Nausea with vomiting, unspecified: Secondary | ICD-10-CM

## 2015-06-04 DIAGNOSIS — R062 Wheezing: Secondary | ICD-10-CM

## 2015-06-04 DIAGNOSIS — J301 Allergic rhinitis due to pollen: Secondary | ICD-10-CM

## 2015-06-04 MED ORDER — BECLOMETHASONE DIPROPIONATE 40 MCG/ACT IN AERS: 2.0000 | INHALATION_SPRAY | Freq: Two times a day (BID) | RESPIRATORY_TRACT | Status: DC

## 2015-06-04 MED ORDER — ONDANSETRON 8 MG PO TBDP: 8.0000 mg | ORAL_TABLET | Freq: Three times a day (TID) | ORAL | Status: DC | PRN

## 2015-06-04 MED ORDER — AZITHROMYCIN 250 MG PO TABS: ORAL_TABLET | ORAL | Status: DC

## 2015-06-04 MED ORDER — ALBUTEROL SULFATE HFA 108 (90 BASE) MCG/ACT IN AERS: 2.0000 | INHALATION_SPRAY | Freq: Four times a day (QID) | RESPIRATORY_TRACT | Status: DC | PRN

## 2015-06-04 MED ORDER — FLUTICASONE PROPIONATE 50 MCG/ACT NA SUSP: 2.0000 | Freq: Every day | NASAL | Status: DC

## 2015-06-04 NOTE — Progress Notes (Signed)
Pre visit review using our clinic review tool, if applicable. No additional management support is needed unless otherwise documented below in the visit note. 

## 2015-06-04 NOTE — Progress Notes (Signed)
Subjective:    Patient ID: Jordan [name], male    DOB: 1981/04/13, 34 y.o.   MRN: [id number]  HPI  Pt in states he has some  Rt knee pain recently.  Pt had knee surgery. May 2010. But overall since 2006 had 4 surgeries. Pt recently saw orthopedist. Per pt he had fluid drawn out today. Analysis and ortho does not think any infection in his knee. Pt states saw ortho last week. He states blood work drawn. Per ortho blood showed some possible infection. Unclear what test was done. Most likley cbc.  Pt states last week he has had diarrhea about 3 times a day for one week. But today no loose stools. No upset stomach. Pt states no recent gi illness in family members. No hx of chronic gi issues. Pt had some nausea and vomiting. Last time he vomited was yesterday. Pt states vomiting about 1 time a day.   Admits to some acid reflux recently.  Then states some coughing recently. Some nasal congestion. No sinus pressure. Lt ear pain recently. Some sneezing and itching eyes.  Pt states rare alcohol. But 2 beers recently.  Some wheezing intermittently in past for which he uses his wife albuterol and states helps a lot.    Review of Systems  Constitutional: Negative for fever, chills and fatigue.  Respiratory: Positive for cough and wheezing. Negative for chest tightness and shortness of breath.        Occasinal wheezing.  Cardiovascular: Negative for chest pain and palpitations.  Gastrointestinal: Positive for nausea, vomiting and diarrhea. Negative for abdominal pain and abdominal distention.       None except faint on exam.  Some reflux.  Musculoskeletal: Negative for back pain.       Knee pain rt side.  Neurological: Negative for dizziness, speech difficulty, weakness, numbness and headaches.  Hematological: Negative for adenopathy. Does not bruise/bleed easily.     Past Medical History  Diagnosis Date  . HYPERTENSION, MILD   . Anxiety and depression   . Chronic pain syndrome     . General medical examination   . OSTEOARTHRITIS     History   Social History  . Marital Status: Married    Spouse Name: N/A  . Number of Children: 2  . Years of Education: N/A   Occupational History  .     Social History Main Topics  . Smoking status: Former Smoker -- 2 years    Types: Cigarettes  . Smokeless tobacco: Former Neurosurgeon    Quit date: 01/18/2014  . Alcohol Use: 0.0 oz/week    0 Standard drinks or equivalent per week     Comment: occasional  . Drug Use: No  . Sexual Activity: Not on file   Other Topics Concern  . Not on file   Social History Narrative   Caffeine Use:  6 beverages daily   Regular exercise:  2 x weekly          Past Surgical History  Procedure Laterality Date  . Knee surgery Right     Family History  Problem Relation Age of Onset  . Heart disease Father   . Heart disease Paternal Grandfather   . Heart disease Paternal Grandmother   . Heart disease Paternal Uncle   . Diabetes Paternal Uncle   . Diabetes Paternal Aunt   . Hypertension Maternal Uncle   . Cancer Maternal Aunt     lung  . Cancer Maternal Uncle     lung  Allergies  Allergen Reactions  . Butrans [Buprenorphine Hcl]     Difficulty breathing, n/v  . Nucynta [Tapentadol Hydrochloride] Anaphylaxis  . Sumac     poison    Current Outpatient Prescriptions on File Prior to Visit  Medication Sig Dispense Refill  . aspirin 81 MG tablet Take 81 mg by mouth daily.    . cetirizine (ZYRTEC) 10 MG tablet Take 1 tablet (10 mg total) by mouth daily. 30 tablet 5  . diclofenac sodium (VOLTAREN) 1 % GEL Apply topically 4 (four) times daily.    . Flavocoxid (LIMBREL) 500 MG CAPS Take 1 capsule (500 mg total) by mouth 2 (two) times daily. 60 capsule 5  . L-Methylfolate-Algae-B12-B6 (METANX) 3-90.314-2-35 MG CAPS Take 1 capsule by mouth 2 (two) times daily. 60 capsule 5  . venlafaxine XR (EFFEXOR XR) 37.5 MG 24 hr capsule Take 1 capsule (37.5 mg total) by mouth daily with  breakfast. 30 capsule 0   No current facility-administered medications on file prior to visit.    BP 143/80 mmHg  Pulse 56  Temp(Src) 98.7 F (37.1 C) (Oral)  Ht 5\' 6"  (1.676 m)  Wt 310 lb (140.615 kg)  BMI 50.06 kg/m2  SpO2 98%       Objective:   Physical Exam  General  Mental Status - Alert. General Appearance - Well groomed. Not in acute distress.  Skin Rashes- No Rashes.  HEENT Head- Normal. Ear Auditory Canal - Left- Normal. Right - Normal.Tympanic Membrane- Left- red Right- red Eye Sclera/Conjunctiva- Left- Normal. Right- Normal. Nose & Sinuses Nasal Mucosa- Left-   boggy + Congested. Right-    boggy + Congested. Mouth & Throat Lips: Upper Lip- Normal: no dryness, cracking, pallor, cyanosis, or vesicular eruption. Lower Lip-Normal: no dryness, cracking, pallor, cyanosis or vesicular eruption. +pnd. Buccal Mucosa- Bilateral- No Aphthous ulcers. Oropharynx- No Discharge or Erythema. Tonsils: Characteristics- Bilateral- No Erythema or Congestion. Size/Enlargement- Bilateral- No enlargement. Discharge- bilateral-None.  Neck Neck- Supple. No Masses.   Chest and Lung Exam Auscultation: Breath Sounds:- even and unlabored.  Heart- RRR   Abdomen Inspection:-Inspection Normal.  Palpation/Perucssion: Palpation and Percussion of the abdomen reveal- Non Tender, No Rebound tenderness, No rigidity(Guarding) and No Palpable abdominal masses.  Liver:-Normal.  Spleen:- Normal.   Rt knee- no redness or swelling presently. Scars presently. .        Assessment & Plan:

## 2015-06-04 NOTE — Patient Instructions (Addendum)
Pt being followed by ortho for knee pain.  For diarrhea. Bland foods. Immodium otc. zofran for nausea or vomiting. Stool panel studies and get cbc, cmp.  For nausea and vomiting get lipase and amylase.  For your reflux. Continue prilosec. But can increase to 40 mg.   Some allergy symptoms take flonase otc.   Bilateral om. Rx azithromycin. Please be aware this may loosen stools. Use probiotic while on antibiotic.  Wheezing. Suspect asthma. Will rx qvar and albuterol.  Follow up in 10 days or as needed.

## 2015-06-05 LAB — COMPREHENSIVE METABOLIC PANEL
ALK PHOS: 90 U/L (ref 39–117)
ALT: 13 U/L (ref 0–53)
AST: 15 U/L (ref 0–37)
Albumin: 4.1 g/dL (ref 3.5–5.2)
BUN: 7 mg/dL (ref 6–23)
CO2: 29 mEq/L (ref 19–32)
Calcium: 9.2 mg/dL (ref 8.4–10.5)
Chloride: 104 mEq/L (ref 96–112)
Creatinine, Ser: 0.84 mg/dL (ref 0.40–1.50)
GFR: 110.24 mL/min (ref >60.00)
Glucose, Bld: 77 mg/dL (ref 70–99)
Potassium: 4 mEq/L (ref 3.5–5.1)
SODIUM: 138 meq/L (ref 135–145)
Total Bilirubin: 0.3 mg/dL (ref 0.2–1.2)
Total Protein: 7.3 g/dL (ref 6.0–8.3)

## 2015-06-05 LAB — CBC WITH DIFFERENTIAL/PLATELET
BASOS PCT: 0.7 % (ref 0.0–3.0)
Basophils Absolute: 0.1 10*3/uL (ref 0.0–0.1)
EOS ABS: 0.2 10*3/uL (ref 0.0–0.7)
EOS PCT: 2.8 % (ref 0.0–5.0)
HCT: 39.9 % (ref 39.0–52.0)
Hemoglobin: 12.8 g/dL — ABNORMAL LOW (ref 13.0–17.0)
LYMPHS ABS: 2.6 10*3/uL (ref 0.7–4.0)
LYMPHS PCT: 28.8 % (ref 12.0–46.0)
MCHC: 32.2 g/dL (ref 30.0–36.0)
MCV: 78.5 fl (ref 78.0–100.0)
MONO ABS: 0.7 10*3/uL (ref 0.1–1.0)
MONOS PCT: 7.9 % (ref 3.0–12.0)
NEUTROS ABS: 5.3 10*3/uL (ref 1.4–7.7)
Neutrophils Relative %: 59.8 % (ref 43.0–77.0)
PLATELETS: 301 10*3/uL (ref 150.0–400.0)
RBC: 5.08 Mil/uL (ref 4.22–5.81)
RDW: 15 % (ref 11.5–15.5)
WBC: 8.9 10*3/uL (ref 4.0–10.5)

## 2015-06-05 LAB — LIPASE: LIPASE: 17 U/L (ref 11.0–59.0)

## 2015-06-05 LAB — AMYLASE: Amylase: 40 U/L (ref 27–131)

## 2015-06-18 ENCOUNTER — Telehealth: Payer: Self-pay | Admitting: Behavioral Health

## 2015-06-18 NOTE — Telephone Encounter (Signed)
Unable to reach patient at time of Pre-Visit Call.  Left message for patient to return call when available.    

## 2015-06-19 ENCOUNTER — Encounter: Payer: Commercial Managed Care - HMO | Admitting: Family

## 2015-06-20 ENCOUNTER — Encounter: Payer: Self-pay | Admitting: Family

## 2015-06-20 ENCOUNTER — Telehealth: Payer: Self-pay | Admitting: Family

## 2015-06-20 NOTE — Telephone Encounter (Signed)
Pt was no show 06/19/15 1:45pm, cpe appt, has not rescheduled, charge?

## 2015-06-20 NOTE — Telephone Encounter (Signed)
Yes pls

## 2015-06-25 DIAGNOSIS — G4733 Obstructive sleep apnea (adult) (pediatric): Secondary | ICD-10-CM | POA: Diagnosis not present

## 2015-07-25 DIAGNOSIS — G4733 Obstructive sleep apnea (adult) (pediatric): Secondary | ICD-10-CM | POA: Diagnosis not present

## 2015-08-06 ENCOUNTER — Encounter: Payer: Commercial Managed Care - HMO | Admitting: Family

## 2015-08-15 ENCOUNTER — Telehealth: Payer: Self-pay | Admitting: *Deleted

## 2015-08-15 MED ORDER — METANX 3-90.314-2-35 MG PO CAPS: 1.0000 | ORAL_CAPSULE | Freq: Two times a day (BID) | ORAL | Status: DC

## 2015-08-15 MED ORDER — FLAVOCOXID 500 MG PO CAPS: 1.0000 | ORAL_CAPSULE | Freq: Two times a day (BID) | ORAL | Status: DC

## 2015-08-15 NOTE — Telephone Encounter (Signed)
Received faxed from [facility] requesting refills for: metanx and limbrel.  Pt has f/u 09/17/15 and refill sent.

## 2015-08-25 DIAGNOSIS — G4733 Obstructive sleep apnea (adult) (pediatric): Secondary | ICD-10-CM | POA: Diagnosis not present

## 2015-08-28 ENCOUNTER — Ambulatory Visit: Payer: Self-pay | Admitting: Pulmonary Disease

## 2015-09-03 ENCOUNTER — Encounter: Payer: Self-pay | Admitting: Pulmonary Disease

## 2015-09-03 ENCOUNTER — Ambulatory Visit: Payer: Commercial Managed Care - HMO | Admitting: Pulmonary Disease

## 2015-09-03 ENCOUNTER — Ambulatory Visit (INDEPENDENT_AMBULATORY_CARE_PROVIDER_SITE_OTHER): Payer: Medicare HMO | Admitting: Pulmonary Disease

## 2015-09-03 VITALS — BP 138/86 | HR 78 | Ht 66.0 in | Wt 308.2 lb

## 2015-09-03 DIAGNOSIS — G4733 Obstructive sleep apnea (adult) (pediatric): Secondary | ICD-10-CM | POA: Diagnosis not present

## 2015-09-03 NOTE — Progress Notes (Signed)
   Subjective:    Patient ID: Jordan [name], male    DOB: Apr 03, 1981, 34 y.o.   MRN: [id number]  HPI For FU of OSA  -strong f/h/o CAD  NPSG 11/2014:  AHI 23/hr, cpap to 11cm  Chief Complaint  Patient presents with  . Sleep Apnea    Patient has been getting respiratory infections since he started using the CPAP machine.  Patient wants to be put on Symbicort instead of Qvar... wants patient assistance program   -on CPAP x , FF mask  -can dream, complaints of high pressure, mask okay, has an occasional leak Feels refreshed on waking up He ambulates with a cane due to a bad right knee He does seem to have severe anxiety, he does have some degree of chronic dyspnea related to his weight and anxiety. He is interested in exploring bariatric surgery  Download 08/2015 - on auto- avg pr 16 cm, no residuals, occ leak  Review of Systems neg for any significant sore throat, dysphagia, itching, sneezing, nasal congestion or excess/ purulent secretions, fever, chills, sweats, unintended wt loss, pleuritic or exertional cp, hempoptysis, orthopnea pnd or change in chronic leg swelling. Also denies presyncope, palpitations, heartburn, abdominal pain, nausea, vomiting, diarrhea or change in bowel or urinary habits, dysuria,hematuria, rash, arthralgias, visual complaints, headache, numbness weakness or ataxia.     Objective:   Physical Exam  Gen. Pleasant, obese, in no distress ENT - no lesions, no post nasal drip Neck: No JVD, no thyromegaly, no carotid bruits Lungs: no use of accessory muscles, no dullness to percussion, decreased without rales or rhonchi  Cardiovascular: Rhythm regular, heart sounds  normal, no murmurs or gallops, no peripheral edema Musculoskeletal: No deformities, no cyanosis or clubbing , no tremors        Assessment & Plan:

## 2015-09-03 NOTE — Patient Instructions (Signed)
obstructive sleep apnea is well controlled on autoCPAP settings Explore weight loss surgery

## 2015-09-04 NOTE — Assessment & Plan Note (Signed)
I offered him to change his CPAP pressure to a fixed pressure but he likes the auto settings-we will leave him on these.  CPAP supplies will be renewed for a year. I provided her with the contact for bariatric surgery  Weight loss encouraged, compliance with goal of at least 4-6 hrs every night is the expectation. Advised against medications with sedative side effects Cautioned against driving when sleepy - understanding that sleepiness will vary on a day to day basis

## 2015-09-11 DIAGNOSIS — G4733 Obstructive sleep apnea (adult) (pediatric): Secondary | ICD-10-CM | POA: Diagnosis not present

## 2015-09-16 ENCOUNTER — Telehealth: Payer: Self-pay | Admitting: Behavioral Health

## 2015-09-16 ENCOUNTER — Encounter: Payer: Self-pay | Admitting: Behavioral Health

## 2015-09-16 NOTE — Addendum Note (Signed)
Addended by: Harold Barban E on: 09/16/2015 11:02 AM   Modules accepted: Orders, Medications

## 2015-09-16 NOTE — Telephone Encounter (Signed)
Pre-Visit Call completed with patient and chart updated.   Pre-Visit Info documented in Specialty Comments under [employer].    

## 2015-09-16 NOTE — Telephone Encounter (Signed)
Unable to reach patient at time of Pre-Visit Call.  Left message for patient to return call when available.    

## 2015-09-17 ENCOUNTER — Ambulatory Visit (INDEPENDENT_AMBULATORY_CARE_PROVIDER_SITE_OTHER): Payer: Medicare HMO | Admitting: Family

## 2015-09-17 ENCOUNTER — Encounter: Payer: Self-pay | Admitting: Pulmonary Disease

## 2015-09-17 ENCOUNTER — Encounter: Payer: Self-pay | Admitting: Family

## 2015-09-17 ENCOUNTER — Ambulatory Visit: Payer: Self-pay | Admitting: Adult Health

## 2015-09-17 VITALS — BP 130/92 | HR 87 | Temp 97.9°F | Resp 16

## 2015-09-17 DIAGNOSIS — Z0000 Encounter for general adult medical examination without abnormal findings: Secondary | ICD-10-CM | POA: Diagnosis not present

## 2015-09-17 DIAGNOSIS — E785 Hyperlipidemia, unspecified: Secondary | ICD-10-CM

## 2015-09-17 DIAGNOSIS — R609 Edema, unspecified: Secondary | ICD-10-CM | POA: Diagnosis not present

## 2015-09-17 DIAGNOSIS — J069 Acute upper respiratory infection, unspecified: Secondary | ICD-10-CM | POA: Diagnosis not present

## 2015-09-17 DIAGNOSIS — Z23 Encounter for immunization: Secondary | ICD-10-CM

## 2015-09-17 DIAGNOSIS — G4733 Obstructive sleep apnea (adult) (pediatric): Secondary | ICD-10-CM

## 2015-09-17 LAB — LIPID PANEL
CHOL/HDL RATIO: 3
CHOLESTEROL: 168 mg/dL (ref 0–200)
HDL: 49.4 mg/dL (ref >39.00)
LDL CALC: 101 mg/dL — AB (ref 0–99)
NonHDL: 118.15
TRIGLYCERIDES: 84 mg/dL (ref 0.0–149.0)
VLDL: 16.8 mg/dL (ref 0.0–40.0)

## 2015-09-17 MED ORDER — METANX 3-90.314-2-35 MG PO CAPS: 1.0000 | ORAL_CAPSULE | Freq: Two times a day (BID) | ORAL | Status: DC

## 2015-09-17 MED ORDER — FLAVOCOXID 500 MG PO CAPS: 1.0000 | ORAL_CAPSULE | Freq: Two times a day (BID) | ORAL | Status: DC

## 2015-09-17 MED ORDER — BECLOMETHASONE DIPROPIONATE 40 MCG/ACT IN AERS: 2.0000 | INHALATION_SPRAY | Freq: Two times a day (BID) | RESPIRATORY_TRACT | Status: DC

## 2015-09-17 NOTE — Patient Instructions (Addendum)
Please visit the [facility] to register for bariatric class:  [email or web address] Start Qvar. Call if cough worsens, if you develop fever, or if cough is not improved in 3-4 days. You will be contacted about your referral to nutritionist. Work on low sodium diet, exercise, weight loss.  Elevated legs as much as possible.  Complete lab work prior to leaving. Please schedule a follow up appointment in 6 months.

## 2015-09-17 NOTE — Progress Notes (Signed)
Subjective:    Patient ID: Jordan [name], male    DOB: 11-Nov-1981, 34 y.o.   MRN: [id number]  HPI  Patient presents today for complete physical.  Immunizations: Would like a flu shot today Diet: 6 small meals a day.   Exercise: very little- does the exercise bike.  Wt Readings from Last 3 Encounters:  09/03/15 308 lb 3.2 oz (139.799 kg)  06/04/15 310 lb (140.615 kg)  03/05/15 302 lb (136.986 kg)   Cough- reports that his cough started 4 days ago.  He denies fever. He is using flonase, zyrtec. Felt that the flonase helped with his cough.   Edema- Reports that his LE edema worsens as the day progresses.He denies chest pain.  He does report some shortness of breath and has felt need to use his inhaler.  Reports that this helps temporarily.  He reports that he did not start qvar.  Tried wife's symbicort.    OSA-  He reports + compliance with his CPAP.  He is inquiring about bariatric surgery.  Dad had MI at 58, PGF 41 MI, PGM heart disease, uncle hear disease.     Review of Systems  Constitutional: Negative for unexpected weight change.  HENT: Positive for rhinorrhea. Negative for hearing loss and sore throat.   Eyes: Negative for visual disturbance.  Respiratory: Positive for cough.   Cardiovascular: Positive for leg swelling.  Gastrointestinal: Negative for nausea, vomiting and diarrhea.  Genitourinary: Negative for dysuria and frequency.  Musculoskeletal: Positive for back pain. Negative for myalgias.       Chronic knee/back/ankle pain  Skin: Negative for rash.  Neurological: Negative for headaches.  Psychiatric/Behavioral:       He plans to schedule apt with psychiatry for ongoing depression/anxiety     Past Medical History  Diagnosis Date  . HYPERTENSION, MILD   . Anxiety and depression   . Chronic pain syndrome   . General medical examination   . OSTEOARTHRITIS     Social History   Social History  . Marital Status: Married    Spouse Name: N/A  .  Number of Children: 2  . Years of Education: N/A   Occupational History  .     Social History Main Topics  . Smoking status: Former Smoker -- 2 years    Types: Cigarettes  . Smokeless tobacco: Former Neurosurgeon    Quit date: 01/18/2014  . Alcohol Use: 0.0 oz/week    0 Standard drinks or equivalent per week     Comment: occasional  . Drug Use: No  . Sexual Activity: Not on file   Other Topics Concern  . Not on file   Social History Narrative   Caffeine Use:  6 beverages daily   Regular exercise:  2 x weekly          Past Surgical History  Procedure Laterality Date  . Knee surgery Right     Family History  Problem Relation Age of Onset  . Heart disease Father   . Heart disease Paternal Grandfather   . Heart disease Paternal Grandmother   . Heart disease Paternal Uncle   . Diabetes Paternal Uncle   . Diabetes Paternal Aunt   . Hypertension Maternal Uncle   . Cancer Maternal Aunt     lung  . Cancer Maternal Uncle     lung    Allergies  Allergen Reactions  . Butrans [Buprenorphine Hcl]     Difficulty breathing, n/v  . Nucynta [Tapentadol Hydrochloride] Anaphylaxis  .  [place]     poison    Current Outpatient Prescriptions on File Prior to Visit  Medication Sig Dispense Refill  . albuterol (PROVENTIL HFA;VENTOLIN HFA) 108 (90 BASE) MCG/ACT inhaler Inhale 2 puffs into the lungs every 6 (six) hours as needed for wheezing or shortness of breath. 1 Inhaler 0  . aspirin 81 MG tablet Take 81 mg by mouth daily.    . cetirizine (ZYRTEC) 10 MG tablet Take 1 tablet (10 mg total) by mouth daily. 30 tablet 5  . diclofenac sodium (VOLTAREN) 1 % GEL Apply topically 4 (four) times daily.    . Flavocoxid (LIMBREL) 500 MG CAPS Take 1 capsule (500 mg total) by mouth 2 (two) times daily. 60 capsule 1  . fluticasone (FLONASE) 50 MCG/ACT nasal spray Place 2 sprays into both nostrils daily. 16 g 1  . L-Methylfolate-Algae-B12-B6 (METANX) 3-90.314-2-35 MG CAPS Take 1 capsule by mouth 2 (two)  times daily. 60 capsule 1  . beclomethasone (QVAR) 40 MCG/ACT inhaler Inhale 2 puffs into the lungs 2 (two) times daily at 10 AM and 5 PM. (Patient not taking: Reported on 09/17/2015) 1 Inhaler 0   No current facility-administered medications on file prior to visit.    BP 130/92 mmHg  Pulse 87  Temp(Src) 97.9 F (36.6 C) (Oral)  Resp 16  SpO2 98%       Objective:   Physical Exam  Constitutional: He is oriented to person, place, and time. He appears well-developed and well-nourished. No distress.  HENT:  Head: Normocephalic and atraumatic.  Right Ear: Tympanic membrane and ear canal normal.  Left Ear: Tympanic membrane and ear canal normal.  Mouth/Throat: No posterior oropharyngeal edema or posterior oropharyngeal erythema.  Eyes: Pupils are equal, round, and reactive to light. No scleral icterus.  Cardiovascular: Normal rate and regular rhythm.   No murmur heard. 1+ bilateral LE edema  Pulmonary/Chest: Effort normal and breath sounds normal. No respiratory distress. He has no wheezes. He has no rales. He exhibits no tenderness.  Abdominal: Soft. He exhibits no distension. There is no tenderness.  Lymphadenopathy:    He has no cervical adenopathy.  Neurological: He is alert and oriented to person, place, and time. He exhibits normal muscle tone.  Skin: Skin is warm and dry.  Psychiatric: He has a normal mood and affect. His behavior is normal. Judgment and thought content normal.          Assessment & Plan:  URI- symptoms most consistent with viral URI. Advised pt on supportive measures and to call if symptoms worsen or do not improve.

## 2015-09-17 NOTE — Progress Notes (Signed)
Pre visit review using our clinic review tool, if applicable. No additional management support is needed unless otherwise documented below in the visit note. 

## 2015-09-18 ENCOUNTER — Encounter: Payer: Self-pay | Admitting: Family

## 2015-09-18 LAB — HIV ANTIBODY (ROUTINE TESTING W REFLEX): HIV: NONREACTIVE

## 2015-09-18 NOTE — Assessment & Plan Note (Signed)
Discussed diet/exercise/weight loss.  Obtain routine labs. Flu shot today.

## 2015-09-18 NOTE — Assessment & Plan Note (Signed)
Most likely due to mild venous insufficiency.  Discussed low sodium diet, elevation.

## 2015-09-18 NOTE — Assessment & Plan Note (Signed)
Good compliance with CPAP.  Continue same 

## 2015-09-18 NOTE — Assessment & Plan Note (Signed)
He is interested in possibility of pursuing bariatric surgery. Advised pt to consider attending one of the bariatric seminars.

## 2015-09-25 DIAGNOSIS — G4733 Obstructive sleep apnea (adult) (pediatric): Secondary | ICD-10-CM | POA: Diagnosis not present

## 2015-10-25 DIAGNOSIS — G4733 Obstructive sleep apnea (adult) (pediatric): Secondary | ICD-10-CM | POA: Diagnosis not present

## 2015-11-11 DIAGNOSIS — H5213 Myopia, bilateral: Secondary | ICD-10-CM | POA: Diagnosis not present

## 2015-11-11 DIAGNOSIS — H5210 Myopia, unspecified eye: Secondary | ICD-10-CM | POA: Diagnosis not present

## 2015-11-25 DIAGNOSIS — G4733 Obstructive sleep apnea (adult) (pediatric): Secondary | ICD-10-CM | POA: Diagnosis not present

## 2015-12-12 ENCOUNTER — Ambulatory Visit (INDEPENDENT_AMBULATORY_CARE_PROVIDER_SITE_OTHER): Payer: Commercial Managed Care - HMO | Admitting: Family Medicine

## 2015-12-12 ENCOUNTER — Encounter: Payer: Self-pay | Admitting: Family Medicine

## 2015-12-12 VITALS — BP 116/76 | HR 90 | Temp 98.1°F | Resp 20 | Ht 66.0 in | Wt 322.4 lb

## 2015-12-12 DIAGNOSIS — J019 Acute sinusitis, unspecified: Secondary | ICD-10-CM

## 2015-12-12 DIAGNOSIS — H52209 Unspecified astigmatism, unspecified eye: Secondary | ICD-10-CM | POA: Diagnosis not present

## 2015-12-12 DIAGNOSIS — R05 Cough: Secondary | ICD-10-CM | POA: Diagnosis not present

## 2015-12-12 DIAGNOSIS — Z0100 Encounter for examination of eyes and vision without abnormal findings: Secondary | ICD-10-CM | POA: Diagnosis not present

## 2015-12-12 DIAGNOSIS — R059 Cough, unspecified: Secondary | ICD-10-CM

## 2015-12-12 DIAGNOSIS — H5213 Myopia, bilateral: Secondary | ICD-10-CM | POA: Diagnosis not present

## 2015-12-12 LAB — POCT INFLUENZA A/B: INFLUENZA A+ B VIRUS AG-DIRECT(RAPID): NEGATIVE

## 2015-12-12 MED ORDER — PREDNISONE 10 MG PO TABS: ORAL_TABLET | ORAL | Status: DC

## 2015-12-12 MED ORDER — ONDANSETRON HCL 4 MG PO TABS: 4.0000 mg | ORAL_TABLET | Freq: Three times a day (TID) | ORAL | Status: DC | PRN

## 2015-12-12 MED ORDER — ALBUTEROL SULFATE (2.5 MG/3ML) 0.083% IN NEBU
2.5000 mg | INHALATION_SOLUTION | Freq: Once | RESPIRATORY_TRACT | Status: AC
  Administered 2015-12-12: 2.5 mg via RESPIRATORY_TRACT

## 2015-12-12 MED ORDER — LEVOFLOXACIN 500 MG PO TABS: 500.0000 mg | ORAL_TABLET | Freq: Every day | ORAL | Status: DC

## 2015-12-12 NOTE — Patient Instructions (Signed)
Please complete antibiotic as directed for sinus symptoms.  Use prednisone as prescribed for shortness of breath and cough. Delsym over the counter cough medicine can be used for cough also. Zofran as needed for nausea and vomiting. Continue to drink small, frequent sips of fluids to maintain hydration. Follow up next week on 12/15/15 for evaluation of symptoms. If symptoms do not improve or if temp is >101, or nausea and vomiting continue, please go to emergency room for further evaluation.  Sinusitis, Adult Sinusitis is redness, soreness, and inflammation of the paranasal sinuses. Paranasal sinuses are air pockets within the bones of your face. They are located beneath your eyes, in the middle of your forehead, and above your eyes. In healthy paranasal sinuses, mucus is able to drain out, and air is able to circulate through them by way of your nose. However, when your paranasal sinuses are inflamed, mucus and air can become trapped. This can allow bacteria and other germs to grow and cause infection. Sinusitis can develop quickly and last only a short time (acute) or continue over a long period (chronic). Sinusitis that lasts for more than 12 weeks is considered chronic. CAUSES Causes of sinusitis include:  Allergies.  Structural abnormalities, such as displacement of the cartilage that separates your nostrils (deviated septum), which can decrease the air flow through your nose and sinuses and affect sinus drainage.  Functional abnormalities, such as when the small hairs (cilia) that line your sinuses and help remove mucus do not work properly or are not present. SIGNS AND SYMPTOMS Symptoms of acute and chronic sinusitis are the same. The primary symptoms are pain and pressure around the affected sinuses. Other symptoms include:  Upper toothache.  Earache.  Headache.  Bad breath.  Decreased sense of smell and taste.  A cough, which worsens when you are lying  flat.  Fatigue.  Fever.  Thick drainage from your nose, which often is green and may contain pus (purulent).  Swelling and warmth over the affected sinuses. DIAGNOSIS Your health care provider will perform a physical exam. During your exam, your health care provider may perform any of the following to help determine if you have acute sinusitis or chronic sinusitis:  Look in your nose for signs of abnormal growths in your nostrils (nasal polyps).  Tap over the affected sinus to check for signs of infection.  View the inside of your sinuses using an imaging device that has a light attached (endoscope). If your health care provider suspects that you have chronic sinusitis, one or more of the following tests may be recommended:  Allergy tests.  Nasal culture. A sample of mucus is taken from your nose, sent to a lab, and screened for bacteria.  Nasal cytology. A sample of mucus is taken from your nose and examined by your health care provider to determine if your sinusitis is related to an allergy. TREATMENT Most cases of acute sinusitis are related to a viral infection and will resolve on their own within 10 days. Sometimes, medicines are prescribed to help relieve symptoms of both acute and chronic sinusitis. These may include pain medicines, decongestants, nasal steroid sprays, or saline sprays. However, for sinusitis related to a bacterial infection, your health care provider will prescribe antibiotic medicines. These are medicines that will help kill the bacteria causing the infection. Rarely, sinusitis is caused by a fungal infection. In these cases, your health care provider will prescribe antifungal medicine. For some cases of chronic sinusitis, surgery is needed. Generally, these are  cases in which sinusitis recurs more than 3 times per year, despite other treatments. HOME CARE INSTRUCTIONS  Drink plenty of water. Water helps thin the mucus so your sinuses can drain more  easily.  Use a humidifier.  Inhale steam 3-4 times a day (for example, sit in the bathroom with the shower running).  Apply a warm, moist washcloth to your face 3-4 times a day, or as directed by your health care provider.  Use saline nasal sprays to help moisten and clean your sinuses.  Take medicines only as directed by your health care provider.  If you were prescribed either an antibiotic or antifungal medicine, finish it all even if you start to feel better. SEEK IMMEDIATE MEDICAL CARE IF:  You have increasing pain or severe headaches.  You have nausea, vomiting, or drowsiness.  You have swelling around your face.  You have vision problems.  You have a stiff neck.  You have difficulty breathing.   This information is not intended to replace advice given to you by your health care provider. Make sure you discuss any questions you have with your health care provider.   Document Released: 12/13/2005 Document Revised: 01/03/2015 Document Reviewed: 12/28/2011 Elsevier Interactive Patient Education [employer]

## 2015-12-12 NOTE — Progress Notes (Signed)
Pre visit review using our clinic review tool, if applicable. No additional management support is needed unless otherwise documented below in the visit note. 

## 2015-12-12 NOTE — Addendum Note (Signed)
Addended by: Mervin KungFERGERSON, Zofia Peckinpaugh A on: 12/12/2015 04:55 PM   Modules accepted: Orders

## 2015-12-12 NOTE — Progress Notes (Signed)
Subjective:    Patient ID: Jordan [name], male    DOB: 1981/06/13, 34 y.o.   MRN: [id number]  HPI  Patient is a 34 year old male who presents today with a chief complaint of cough for 6 days with increasing SOB and chest tightness.He denies chest pain, arm pain, or jaw pain.  Cough is productive of green sputum which keeps patient up at night.              ,  Associated symptoms include runny nose, pain and pressure in frontal and maxillary sinuses nasal obstruction, sneezing,chills, and subjective fever, loss of smell and taste. He denies pain in teeth at this time.  Other symptoms include nausea, vomiting, and diarrhea 2 times a day with no blood in stool noted for the past 6 days.  Vomiting 2 times per day that is associated with coughing and increased food intake. Patient is able to hold down fluids by sipping frequently with small volumes multiple times/day.He is currently using his albuterol inhaler 4-5 times per day and uses QVAR daily. History of OSA and uses CPAP at night.  He recently traveled out of state with a group of children who have had similar symptoms. OTC Tylenol cough and cold has provided limited benefit. Patient has noted that the cold air outside has increased his SOB and chest tightness.    Review of Systems  Constitutional: Positive for fever, appetite change and fatigue.  HENT: Positive for congestion, ear pain, postnasal drip, rhinorrhea, sinus pressure (Frontal and maxillary pain and pressure) and sneezing. Negative for hearing loss and nosebleeds.   Respiratory: Positive for cough, chest tightness and shortness of breath.   Cardiovascular: Negative.   Gastrointestinal: Positive for nausea, vomiting and diarrhea. Negative for abdominal pain, blood in stool and abdominal distention.  Genitourinary: Negative.   Musculoskeletal:       Patient states that he feels "achy" all over since the symptoms started  Skin: Negative.   Neurological: Negative for  dizziness.   Past Medical History  Diagnosis Date  . HYPERTENSION, MILD   . Anxiety and depression   . Chronic pain syndrome   . General medical examination   . OSTEOARTHRITIS     Social History   Social History  . Marital Status: Married    Spouse Name: N/A  . Number of Children: 2  . Years of Education: N/A   Occupational History  .     Social History Main Topics  . Smoking status: Former Smoker -- 2 years    Types: Cigarettes  . Smokeless tobacco: Former NeurosurgeonUser    Quit date: 01/18/2014  . Alcohol Use: 0.0 oz/week    0 Standard drinks or equivalent per week     Comment: occasional  . Drug Use: No  . Sexual Activity: Not on file   Other Topics Concern  . Not on file   Social History Narrative   Caffeine Use:  6 beverages daily   Regular exercise:  2 x weekly          Past Surgical History  Procedure Laterality Date  . Knee surgery Right     Family History  Problem Relation Age of Onset  . Heart disease Father   . Heart disease Paternal Grandfather   . Heart disease Paternal Grandmother   . Heart disease Paternal Uncle   . Diabetes Paternal Uncle   . Diabetes Paternal Aunt   . Hypertension Maternal Uncle   . Cancer Maternal Aunt  lung  . Cancer Maternal Uncle     lung    Allergies  Allergen Reactions  . Butrans [Buprenorphine Hcl]     Difficulty breathing, n/v  . Nucynta [Tapentadol Hydrochloride] Anaphylaxis  . [place]     poison    Current Outpatient Prescriptions on File Prior to Visit  Medication Sig Dispense Refill  . albuterol (PROVENTIL HFA;VENTOLIN HFA) 108 (90 BASE) MCG/ACT inhaler Inhale 2 puffs into the lungs every 6 (six) hours as needed for wheezing or shortness of breath. 1 Inhaler 0  . aspirin 81 MG tablet Take 81 mg by mouth daily.    . beclomethasone (QVAR) 40 MCG/ACT inhaler Inhale 2 puffs into the lungs 2 (two) times daily at 10 AM and 5 PM. 1 Inhaler 2  . cetirizine (ZYRTEC) 10 MG tablet Take 1 tablet (10 mg total) by  mouth daily. 30 tablet 5  . diclofenac sodium (VOLTAREN) 1 % GEL Apply topically 4 (four) times daily.    . Flavocoxid (LIMBREL) 500 MG CAPS Take 1 capsule (500 mg total) by mouth 2 (two) times daily. 60 capsule 5  . fluticasone (FLONASE) 50 MCG/ACT nasal spray Place 2 sprays into both nostrils daily. 16 g 1  . L-Methylfolate-Algae-B12-B6 (METANX) 3-90.314-2-35 MG CAPS Take 1 capsule by mouth 2 (two) times daily. 60 capsule 4   No current facility-administered medications on file prior to visit.    BP 116/76 mmHg  Pulse 90  Temp(Src) 98.1 F (36.7 C) (Oral)  Resp 20  Ht  (1.676 m)  Wt 322 lb 6.4 oz (146.24 kg)  BMI 52.06 kg/m2  SpO2 100%       Objective:   Physical Exam  Constitutional: He is oriented to person, place, and time.  HENT:  Right Ear: Tympanic membrane normal.  Left Ear: Tympanic membrane normal.  Nose: Rhinorrhea present. Right sinus exhibits maxillary sinus tenderness and frontal sinus tenderness. Left sinus exhibits maxillary sinus tenderness and frontal sinus tenderness.  Mouth/Throat: Mucous membranes are normal. No oropharyngeal exudate.  Cardiovascular: Normal rate, regular rhythm and normal heart sounds.   Pulmonary/Chest: He has wheezes (Expiratory wheeze). He has no rales. He exhibits no tenderness.  Abdominal: Soft. Bowel sounds are normal. There is no tenderness.  Neurological: He is alert and oriented to person, place, and time.  Skin: Skin is warm and dry.           Assessment & Plan:  Sinus pressure/pain,cough and wheezing: Symptoms consistent with acute sinusitis. Expiratory wheeze noted. Patient given in office albuterol treatment for SOB, cough, and wheeze. Prednisone taper prescribed for patient and in office albuterol provided benefit for SOB.Levaquin  x 7 days provided for sinus and lower respiratory symptoms. Discussed the use of OTC Delsym for nighttime cough additionally. Prescriptions were provided by preceptor Sandford Craze due to finalizing credentialing paperwork with Medicare. Rapid flu negative  Nausea, vomiting, and diarrhea:  Possible resolving viral gastroenteritis due to symptoms of nausea, vomiting, and diarrhea: Zofran  q 8 hours prn for nausea and vomiting prescribed for symptom relief and maintaining fluid status. Encouraged patient to continue small, frequent sips of fluids to maintain hydration status.    Instructed patient to follow up on 12/15/15 for reevaluation of symptoms and to seek emergency medical help if symptoms worsen, temp >101, or nausea, vomiting, and diarrhea increase and patient is unable to hold fluids down.

## 2016-01-14 NOTE — Telephone Encounter (Signed)
Pt wife called stating she knows she called about this appt and should not have to pay the $50 no show. She is requesting it be waived.

## 2016-01-16 ENCOUNTER — Ambulatory Visit: Payer: Self-pay | Admitting: Family Medicine

## 2016-02-19 NOTE — Telephone Encounter (Signed)
Call from wife, Levy Pupa #[phone] She states she called the day before appt to cancel and spoke with office staff and was told they would take care of cancelling appt. She is requesting fee is waived. Please advise.

## 2016-02-19 NOTE — Telephone Encounter (Signed)
[place] or [employer] - please waive the no show fee from 06/19/15

## 2016-02-19 NOTE — Telephone Encounter (Signed)
No charge. 

## 2016-02-27 ENCOUNTER — Other Ambulatory Visit: Payer: Self-pay | Admitting: Family

## 2016-03-02 ENCOUNTER — Other Ambulatory Visit: Payer: Self-pay

## 2016-03-02 MED ORDER — FLAVOCOXID 500 MG PO CAPS: 1.0000 | ORAL_CAPSULE | Freq: Two times a day (BID) | ORAL | Status: DC

## 2016-03-08 ENCOUNTER — Telehealth: Payer: Self-pay | Admitting: *Deleted

## 2016-03-08 NOTE — Telephone Encounter (Signed)
Received fax from pharmacy requesting PA on Limbrel 500-50mg ; could not via Cover my Meds, notification stated Form: To initiate the prior authorization process: "please use this form to document the information needed and call [employer] at [phone]".  Phone: [phone]  Claim Rejection Details  Prior Authorization Required  Called and was informed that this is a Doctor, hospitalWorkman's Compensation claim and was approved 03/01/16 through 05/30/16//SLS 03/13

## 2016-03-16 ENCOUNTER — Encounter: Payer: Self-pay | Admitting: Family

## 2016-03-16 ENCOUNTER — Ambulatory Visit (INDEPENDENT_AMBULATORY_CARE_PROVIDER_SITE_OTHER): Payer: Commercial Managed Care - HMO | Admitting: Family

## 2016-03-16 VITALS — BP 140/92 | HR 78 | Temp 98.3°F | Resp 22 | Ht 66.0 in | Wt 323.8 lb

## 2016-03-16 DIAGNOSIS — F419 Anxiety disorder, unspecified: Secondary | ICD-10-CM

## 2016-03-16 DIAGNOSIS — R635 Abnormal weight gain: Secondary | ICD-10-CM | POA: Diagnosis not present

## 2016-03-16 DIAGNOSIS — B070 Plantar wart: Secondary | ICD-10-CM

## 2016-03-16 DIAGNOSIS — F329 Major depressive disorder, single episode, unspecified: Secondary | ICD-10-CM

## 2016-03-16 DIAGNOSIS — F418 Other specified anxiety disorders: Secondary | ICD-10-CM

## 2016-03-16 DIAGNOSIS — G4733 Obstructive sleep apnea (adult) (pediatric): Secondary | ICD-10-CM | POA: Diagnosis not present

## 2016-03-16 DIAGNOSIS — F32A Depression, unspecified: Secondary | ICD-10-CM

## 2016-03-16 LAB — TSH: TSH: 3.98 u[IU]/mL (ref 0.35–4.50)

## 2016-03-16 NOTE — Patient Instructions (Addendum)
Follow up in 3 months

## 2016-03-16 NOTE — Progress Notes (Signed)
Subjective:    Patient ID: Jordan [name], male    DOB: [birth year], 35 y.o.   MRN: [id number]  HPI  Jordan [name] is a 35 yr old male who presents today for follow up.  1) weight gain- reports that his diet could be improved.  Reports not exercising due to knee pain.   2) Anxiety/Depression- reports mood is good. Anxiety is stable.  Tries to use meditation  3) OSA- on CPAP.  Reports that he is on autocpap and currently at setting of "20". Has upcoming appointment with Dr. Vassie Loll  4) "corn" right 3rd toe- causing him significant pain.   Review of Systems See HPI  Past Medical History  Diagnosis Date  . HYPERTENSION, MILD   . Anxiety and depression   . Chronic pain syndrome   . General medical examination   . OSTEOARTHRITIS     Social History   Social History  . Marital Status: Married    Spouse Name: N/A  . Number of Children: 2  . Years of Education: N/A   Occupational History  .     Social History Main Topics  . Smoking status: Former Smoker -- 2 years    Types: Cigarettes  . Smokeless tobacco: Former Neurosurgeon    Quit date: 01/18/2014  . Alcohol Use: 0.0 oz/week    0 Standard drinks or equivalent per week     Comment: occasional  . Drug Use: No  . Sexual Activity: Not on file   Other Topics Concern  . Not on file   Social History Narrative   Caffeine Use:  6 beverages daily   Regular exercise:  2 x weekly          Past Surgical History  Procedure Laterality Date  . Knee surgery Right     Family History  Problem Relation Age of Onset  . Heart disease Father   . Heart disease Paternal Grandfather   . Heart disease Paternal Grandmother   . Heart disease Paternal Uncle   . Diabetes Paternal Uncle   . Diabetes Paternal Aunt   . Hypertension Maternal Uncle   . Cancer Maternal Aunt     lung  . Cancer Maternal Uncle     lung    Allergies  Allergen Reactions  . Butrans [Buprenorphine Hcl]     Difficulty breathing, n/v  . Nucynta  [Tapentadol Hydrochloride] Anaphylaxis  . Sumac     poison    Current Outpatient Prescriptions on File Prior to Visit  Medication Sig Dispense Refill  . albuterol (PROVENTIL HFA;VENTOLIN HFA) 108 (90 BASE) MCG/ACT inhaler Inhale 2 puffs into the lungs every 6 (six) hours as needed for wheezing or shortness of breath. 1 Inhaler 0  . aspirin 81 MG tablet Take 81 mg by mouth daily.    . beclomethasone (QVAR) 40 MCG/ACT inhaler Inhale 2 puffs into the lungs 2 (two) times daily at 10 AM and 5 PM. 1 Inhaler 2  . cetirizine (ZYRTEC) 10 MG tablet Take 1 tablet (10 mg total) by mouth daily. 30 tablet 5  . diclofenac sodium (VOLTAREN) 1 % GEL Apply topically 4 (four) times daily.    . Flavocoxid (LIMBREL) 500 MG CAPS Take 1 capsule (500 mg total) by mouth 2 (two) times daily. 60 capsule 1  . L-Methylfolate-Algae-B12-B6 (METANX) 3-90.314-2-35 MG CAPS Take 1 capsule by mouth 2 (two) times daily. 60 capsule 4   No current facility-administered medications on file prior to visit.    BP 140/92 mmHg  Pulse 78  Temp(Src) 98.3 F (36.8 C) (Oral)  Resp 22  Ht 5\' 6"  (1.676 m)  Wt 323 lb 12.8 oz (146.875 kg)  BMI 52.29 kg/m2  SpO2 98%       Objective:   Physical Exam  Constitutional: He is oriented to person, place, and time. He appears well-developed and well-nourished. No distress.  HENT:  Head: Normocephalic and atraumatic.  Eyes: No scleral icterus.  Cardiovascular: Normal rate and regular rhythm.   No murmur heard. Pulmonary/Chest: Effort normal and breath sounds normal. No respiratory distress. He has no wheezes. He has no rales.  Musculoskeletal: He exhibits no edema.  Neurological: He is alert and oriented to person, place, and time.  Skin: Skin is warm and dry.  Large plantar wart noted right third toe medially  Psychiatric: He has a normal mood and affect. His behavior is normal. Thought content normal.          Assessment & Plan:  Weight gain- requesting TSH, will order Wt  Readings from Last 3 Encounters:  03/16/16 323 lb 12.8 oz (146.875 kg)  12/12/15 322 lb 6.4 oz (146.24 kg)  09/03/15 308 lb 3.2 oz (139.799 kg)   Plantar wart- After informed consent obtained, wart was cleansed with alcohol and debrided with sterile blade. Then base of wart was frozen with liquid nitrogen (Freeze/thaw x 3). Pt tolerated procedure without difficulty.

## 2016-03-16 NOTE — Assessment & Plan Note (Signed)
Stable, monitor.  °

## 2016-03-16 NOTE — Assessment & Plan Note (Signed)
Good compliance on cpap- has upcoming appointment with Dr. Vassie LollAlva.

## 2016-03-16 NOTE — Progress Notes (Signed)
Pre visit review using our clinic review tool, if applicable. No additional management support is needed unless otherwise documented below in the visit note. 

## 2016-03-23 DIAGNOSIS — G4733 Obstructive sleep apnea (adult) (pediatric): Secondary | ICD-10-CM | POA: Diagnosis not present

## 2016-06-16 ENCOUNTER — Ambulatory Visit (INDEPENDENT_AMBULATORY_CARE_PROVIDER_SITE_OTHER): Payer: Commercial Managed Care - HMO | Admitting: Family

## 2016-06-16 ENCOUNTER — Encounter: Payer: Self-pay | Admitting: Family

## 2016-06-16 DIAGNOSIS — F418 Other specified anxiety disorders: Secondary | ICD-10-CM

## 2016-06-16 DIAGNOSIS — F329 Major depressive disorder, single episode, unspecified: Secondary | ICD-10-CM

## 2016-06-16 DIAGNOSIS — G4733 Obstructive sleep apnea (adult) (pediatric): Secondary | ICD-10-CM

## 2016-06-16 DIAGNOSIS — F419 Anxiety disorder, unspecified: Secondary | ICD-10-CM

## 2016-06-16 NOTE — Assessment & Plan Note (Signed)
Commended pt for his weight loss efforts.  Did place referral to bariatric surgeon.

## 2016-06-16 NOTE — Assessment & Plan Note (Signed)
Stable off of meds.  Monitor.  

## 2016-06-16 NOTE — Assessment & Plan Note (Signed)
Clinically stable on CPAP, follows with Dr. Vassie LollAlva.

## 2016-06-16 NOTE — Progress Notes (Signed)
Subjective:    Patient ID: Jordan [name], male    DOB: 24-Jan-1981, 35 y.o.   MRN: [id number]  HPI  Jordan [name] is a 35 yr old male who presents today for follow up.  1) Depression/anxiety- Reports that he felt "loopy" on effexor.  He has been using meditation. Overall trying to keep a positive attitude and feels that his mood is stable.   2) OSA-maintained on CPAP. Reports that he feels claustrophobic when he starts.    3) Morbid Obesity- Thinking about bariatric surgery. Eating fresh fruits/veggies.  Does some walking.  He has lost 7 pounds.   Wt Readings from Last 3 Encounters:  06/16/16 316 lb 3.2 oz (143.427 kg)  03/16/16 323 lb 12.8 oz (146.875 kg)  12/12/15 322 lb 6.4 oz (146.24 kg)     Review of Systems    see HPI  Past Medical History  Diagnosis Date  . HYPERTENSION, MILD   . Anxiety and depression   . Chronic pain syndrome   . General medical examination   . OSTEOARTHRITIS      Social History   Social History  . Marital Status: Married    Spouse Name: N/A  . Number of Children: 2  . Years of Education: N/A   Occupational History  .     Social History Main Topics  . Smoking status: Former Smoker -- 2 years    Types: Cigarettes  . Smokeless tobacco: Former NeurosurgeonUser    Quit date: 01/18/2014  . Alcohol Use: 0.0 oz/week    0 Standard drinks or equivalent per week     Comment: occasional  . Drug Use: No  . Sexual Activity: Not on file   Other Topics Concern  . Not on file   Social History Narrative   Caffeine Use:  6 beverages daily   Regular exercise:  2 x weekly          Past Surgical History  Procedure Laterality Date  . Knee surgery Right     Family History  Problem Relation Age of Onset  . Heart disease Father   . Heart disease Paternal Grandfather   . Heart disease Paternal Grandmother   . Heart disease Paternal Uncle   . Diabetes Paternal Uncle   . Diabetes Paternal Aunt   . Hypertension Maternal Uncle   . Cancer  Maternal Aunt     lung  . Cancer Maternal Uncle     lung    Allergies  Allergen Reactions  . Butrans [Buprenorphine Hcl]     Difficulty breathing, n/v  . Nucynta [Tapentadol Hydrochloride] Anaphylaxis  . Sumac     poison    Current Outpatient Prescriptions on File Prior to Visit  Medication Sig Dispense Refill  . albuterol (PROVENTIL HFA;VENTOLIN HFA) 108 (90 BASE) MCG/ACT inhaler Inhale 2 puffs into the lungs every 6 (six) hours as needed for wheezing or shortness of breath. 1 Inhaler 0  . aspirin 81 MG tablet Take 81 mg by mouth daily.    . beclomethasone (QVAR) 40 MCG/ACT inhaler Inhale 2 puffs into the lungs 2 (two) times daily at 10 AM and 5 PM. 1 Inhaler 2  . cetirizine (ZYRTEC) 10 MG tablet Take 1 tablet (10 mg total) by mouth daily. 30 tablet 5  . diclofenac sodium (VOLTAREN) 1 % GEL Apply topically 4 (four) times daily.    . Flavocoxid (LIMBREL) 500 MG CAPS Take 1 capsule (500 mg total) by mouth 2 (two) times daily. 60 capsule 1  .  L-Methylfolate-Algae-B12-B6 (METANX) 3-90.314-2-35 MG CAPS Take 1 capsule by mouth 2 (two) times daily. 60 capsule 4   No current facility-administered medications on file prior to visit.    BP 117/82 mmHg  Pulse 78  Temp(Src) 98.4 F (36.9 C) (Oral)  Resp 16  Ht  (1.676 m)  Wt 316 lb 3.2 oz (143.427 kg)  BMI 51.06 kg/m2  SpO2 98%    Objective:   Physical Exam  Constitutional: He is oriented to person, place, and time. He appears well-developed and well-nourished. No distress.  HENT:  Head: Normocephalic and atraumatic.  Cardiovascular: Normal rate and regular rhythm.   No murmur heard. Pulmonary/Chest: Effort normal and breath sounds normal. No respiratory distress. He has no wheezes. He has no rales.  Musculoskeletal: He exhibits no edema.  Neurological: He is alert and oriented to person, place, and time.  Skin: Skin is warm and dry.  Psychiatric: He has a normal mood and affect. His behavior is normal. Thought content  normal.          Assessment & Plan:

## 2016-06-16 NOTE — Progress Notes (Signed)
Pre visit review using our clinic review tool, if applicable. No additional management support is needed unless otherwise documented below in the visit note. 

## 2016-06-16 NOTE — Patient Instructions (Signed)
You will be contacted about your referral to Bariatric Surgery. Continue to work on weight loss.

## 2016-08-13 ENCOUNTER — Other Ambulatory Visit: Payer: Self-pay | Admitting: *Deleted

## 2016-08-13 MED ORDER — METANX 3-90.314-2-35 MG PO CAPS: 1.0000 | ORAL_CAPSULE | Freq: Two times a day (BID) | ORAL | 5 refills | Status: DC

## 2016-09-17 ENCOUNTER — Ambulatory Visit: Payer: Self-pay | Admitting: Family

## 2016-10-01 ENCOUNTER — Ambulatory Visit (INDEPENDENT_AMBULATORY_CARE_PROVIDER_SITE_OTHER): Payer: Commercial Managed Care - HMO | Admitting: Pulmonary Disease

## 2016-10-01 ENCOUNTER — Encounter: Payer: Self-pay | Admitting: Pulmonary Disease

## 2016-10-01 VITALS — BP 124/78 | HR 74 | Ht 66.0 in | Wt 305.0 lb

## 2016-10-01 DIAGNOSIS — G4733 Obstructive sleep apnea (adult) (pediatric): Secondary | ICD-10-CM | POA: Diagnosis not present

## 2016-10-01 DIAGNOSIS — J4530 Mild persistent asthma, uncomplicated: Secondary | ICD-10-CM

## 2016-10-01 DIAGNOSIS — Z23 Encounter for immunization: Secondary | ICD-10-CM

## 2016-10-01 DIAGNOSIS — J45909 Unspecified asthma, uncomplicated: Secondary | ICD-10-CM | POA: Insufficient documentation

## 2016-10-01 NOTE — Progress Notes (Signed)
   Subjective:    Patient ID: Jordan [name], male    DOB: 04/14/1981, 35 y.o.   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 Follow-up    doing well on the CPAP, having claustrophobia with the mask on.  Lines on face every morning.     He does have some issues with a full face mask but has been able to tolerate. He denies daytime somnolence or fatigue Download shows better compliance more than 6 hours per night with average pressure of 16 cm on auto settings with minimal leak on full face mask  He has lost about 10 pounds to his current weight of 305 pounds Since his last visit he was diagnosed with asthma for intermittent wheezing and he takes albuterol almost daily. [facility] Qvar prescription but he has not been taking this for at least 2 months    Significant tests/ events  NPSG 11/2014:  AHI 23/hr, cpap to 11cm Download 08/2015 - on auto- avg pr 16 cm, no residuals, occ leak Spirometry 09/2016 shows moderate airway restriction with ratio 83 and FVC 67% and FEV1 67%    Review of Systems neg for any significant sore throat, dysphagia, itching, sneezing, nasal congestion or excess/ purulent secretions, fever, chills, sweats, unintended wt loss, pleuritic or exertional cp, hempoptysis, orthopnea pnd or change in chronic leg swelling. Also denies presyncope, palpitations, heartburn, abdominal pain, nausea, vomiting, diarrhea or change in bowel or urinary habits, dysuria,hematuria, rash, arthralgias, visual complaints, headache, numbness weakness or ataxia.     Objective:   Physical Exam   Gen. Pleasant, obese, in no distress ENT - no  lesions, no post nasal drip Neck: No JVD, no thyromegaly, no carotid bruits Lungs: no use of accessory muscles, no dullness to percussion, decreased without rales or rhonchi  Cardiovascular: Rhythm regular, heart sounds  normal, no murmurs or gallops, no peripheral edema Musculoskeletal: No deformities, no cyanosis or clubbing , no tremors        Assessment & Plan:

## 2016-10-01 NOTE — Assessment & Plan Note (Signed)
Trial of nasal mask with chinstrap   Weight loss encouraged, compliance with goal of at least 4-6 hrs every night is the expectation. Advised against medications with sedative side effects Cautioned against driving when sleepy - understanding that sleepiness will vary on a day to day basis

## 2016-10-01 NOTE — Patient Instructions (Addendum)
Trial of nasal mask with chinstrap Breathing test shows effect of weight but no evidence of asthma Okay to discontinue Qvar and use albuterol as needed

## 2016-10-01 NOTE — Assessment & Plan Note (Signed)
Breathing test shows effect of weight but no evidence of asthma Okay to discontinue Qvar and use albuterol as needed

## 2016-11-12 ENCOUNTER — Encounter: Payer: Self-pay | Admitting: Pulmonary Disease

## 2016-12-01 ENCOUNTER — Telehealth: Payer: Self-pay | Admitting: Family

## 2016-12-01 NOTE — Telephone Encounter (Signed)
Patient states he has been on medicare since 2010 patient scheduled medicare wellness for 12/07/16 at 2:30pm with Eber Jonesarolyn.

## 2016-12-07 ENCOUNTER — Ambulatory Visit: Payer: Self-pay | Admitting: *Deleted

## 2016-12-28 ENCOUNTER — Other Ambulatory Visit: Payer: Self-pay | Admitting: Family

## 2016-12-28 NOTE — Telephone Encounter (Signed)
Last Limbrel refill, 06/02/16, #60 x 1 refill. Pt last seen 06/16/16 and advised to follow up in 3 months. Pt is past due for follow up.  Please advise request?

## 2017-01-21 ENCOUNTER — Encounter: Payer: Self-pay | Admitting: Family

## 2017-01-21 ENCOUNTER — Ambulatory Visit (INDEPENDENT_AMBULATORY_CARE_PROVIDER_SITE_OTHER): Payer: Commercial Managed Care - HMO | Admitting: Family

## 2017-01-21 DIAGNOSIS — G4733 Obstructive sleep apnea (adult) (pediatric): Secondary | ICD-10-CM | POA: Diagnosis not present

## 2017-01-21 DIAGNOSIS — F329 Major depressive disorder, single episode, unspecified: Secondary | ICD-10-CM

## 2017-01-21 DIAGNOSIS — I10 Essential (primary) hypertension: Secondary | ICD-10-CM | POA: Diagnosis not present

## 2017-01-21 DIAGNOSIS — F419 Anxiety disorder, unspecified: Secondary | ICD-10-CM

## 2017-01-21 DIAGNOSIS — F418 Other specified anxiety disorders: Secondary | ICD-10-CM | POA: Diagnosis not present

## 2017-01-21 NOTE — Progress Notes (Signed)
Subjective:    Patient ID: Jordan [name], male    DOB: 1981/07/11, 36 y.o.   MRN: [id number]  HPI  Mr. Manlove is a 36 yr old male who presents today for follow up.  1) Depression/anxiety- feels that crowds/close spaces, doctor's visits worsen his anxiety.  Notes that he is unmotivated, likes to be alone.  Reports that he has been using medication He is working with neuropsychology and has been focusing on medication to help with his depression/anxiety. He wishes to avoid medications.    2) Mild hypertension- not maintained on medication. BP Readings from Last 3 Encounters:  01/21/17 130/77  10/01/16 124/78  06/16/16 117/82   3) morbid obesity- He has lost 8 pounds since the summer. Has been working on [employer] and is working out 2 days.   Wt Readings from Last 3 Encounters:  01/21/17 (!) 308 lb 6.4 oz (139.9 kg)  10/01/16 (!) 305 lb (138.3 kg)  06/16/16 (!) 316 lb 3.2 oz (143.4 kg)   Notes some post nasal drainage and cough.  Review of Systems Past Medical History:  Diagnosis Date  . Anxiety and depression   . Chronic pain syndrome   . General medical examination   . HYPERTENSION, MILD   . OSTEOARTHRITIS      Social History   Social History  . Marital status: Married    Spouse name: N/A  . Number of children: 2  . Years of education: N/A   Occupational History  .  Unemployed   Social History Main Topics  . Smoking status: Former Smoker    Years: 2.00    Types: Cigars    Quit date: 01/18/2014  . Smokeless tobacco: Never Used  . Alcohol use 0.0 oz/week     Comment: occasional  . Drug use: No  . Sexual activity: Not on file   Other Topics Concern  . Not on file   Social History Narrative   Caffeine Use:  6 beverages daily   Regular exercise:  2 x weekly          Past Surgical History:  Procedure Laterality Date  . KNEE SURGERY Right     Family History  Problem Relation Age of Onset  . Heart disease Father   . Heart disease  Paternal Grandfather   . Heart disease Paternal Grandmother   . Heart disease Paternal Uncle   . Diabetes Paternal Uncle   . Diabetes Paternal Aunt   . Hypertension Maternal Uncle   . Cancer Maternal Aunt     lung  . Cancer Maternal Uncle     lung    Allergies  Allergen Reactions  . Butrans [Buprenorphine Hcl]     Difficulty breathing, n/v  . Nucynta [Tapentadol Hydrochloride] Anaphylaxis  . Sumac     poison    Current Outpatient Prescriptions on File Prior to Visit  Medication Sig Dispense Refill  . albuterol (PROVENTIL HFA;VENTOLIN HFA) 108 (90 BASE) MCG/ACT inhaler Inhale 2 puffs into the lungs every 6 (six) hours as needed for wheezing or shortness of breath. 1 Inhaler 0  . aspirin 81 MG tablet Take 81 mg by mouth daily.    . beclomethasone (QVAR) 40 MCG/ACT inhaler Inhale 2 puffs into the lungs 2 (two) times daily at 10 AM and 5 PM. (Patient not taking: Reported on 10/01/2016) 1 Inhaler 2  . cetirizine (ZYRTEC) 10 MG tablet Take 1 tablet (10 mg total) by mouth daily. 30 tablet 5  . diclofenac sodium (VOLTAREN) 1 %  GEL Apply topically 4 (four) times daily.    . Flavocoxid (LIMBREL) 500 MG CAPS Take 1 capsule (500 mg total) by mouth 2 (two) times daily. 60 capsule 1  . L-Methylfolate-Algae-B12-B6 (METANX) 3-90.314-2-35 MG CAPS Take 1 capsule by mouth 2 (two) times daily. 60 capsule 5   No current facility-administered medications on file prior to visit.     BP 130/77 (BP Location: Right Arm, Cuff Size: Large)   Pulse 66   Temp 98.1 F (36.7 C) (Oral)   Resp 18   Ht 5\' 6"  (1.676 m)   Wt (!) 308 lb 6.4 oz (139.9 kg)   SpO2 98% Comment: room air  BMI 49.78 kg/m       Objective:   Physical Exam  Constitutional: He is oriented to person, place, and time. He appears well-developed and well-nourished. No distress.  HENT:  Head: Normocephalic and atraumatic.  Mouth/Throat: No oropharyngeal exudate, posterior oropharyngeal edema or posterior oropharyngeal erythema.    Eyes: No scleral icterus.  Cardiovascular: Normal rate and regular rhythm.   No murmur heard. Pulmonary/Chest: Effort normal and breath sounds normal. No respiratory distress. He has no wheezes. He has no rales.  Musculoskeletal: He exhibits no edema.  Neurological: He is alert and oriented to person, place, and time.  Skin: Skin is warm and dry.  Psychiatric: He has a normal mood and affect. His behavior is normal. Thought content normal.          Assessment & Plan:  Cough- suspect secondary to post-nasal drip. Trial of claritin 10mg  once daily.  Advised pt to d/c limbrel due to recent FDA warnings against its use.

## 2017-01-21 NOTE — Assessment & Plan Note (Signed)
Clinically stable on CPAP, managed by Dr. Vassie LollAlva.

## 2017-01-21 NOTE — Progress Notes (Signed)
Pre visit review using our clinic review tool, if applicable. No additional management support is needed unless otherwise documented below in the visit note. 

## 2017-01-21 NOTE — Assessment & Plan Note (Signed)
BP stable today. Monitor.

## 2017-01-21 NOTE — Assessment & Plan Note (Signed)
Discussed weight loss, healthy diet, calorie counting. Discussed referral to weight loss MD, he declines at this time due to cost.

## 2017-01-21 NOTE — Patient Instructions (Signed)
Stop limbrel.   Continue to work on [employer] diet, exercise and weight loss. Add claritin 10mg  once daily for nasal drainage.

## 2017-01-21 NOTE — Assessment & Plan Note (Signed)
Fair control. 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

## 2017-01-29 DIAGNOSIS — G4733 Obstructive sleep apnea (adult) (pediatric): Secondary | ICD-10-CM | POA: Diagnosis not present

## 2017-03-23 ENCOUNTER — Telehealth: Payer: Self-pay | Admitting: *Deleted

## 2017-03-23 NOTE — Telephone Encounter (Signed)
Spoke with Misty StanleyLisa at [facility] she sttates medication needs prior from the adjustor, not the PCP and they have already submitted claim and waiting to hear back from the adjustor. They will notify pharmacy once decision is reached and states there is nothing needed from us at this time.  Received notice via covermymeds that [employer] will require prior authorization:  Form:  [employer] Call-In FormTo initiate the prior authorization process, please use this form to document the information needed and call [employer] at [phone].

## 2017-04-27 ENCOUNTER — Encounter: Payer: Self-pay | Admitting: Family

## 2017-06-21 DIAGNOSIS — G4733 Obstructive sleep apnea (adult) (pediatric): Secondary | ICD-10-CM | POA: Diagnosis not present

## 2017-07-04 ENCOUNTER — Other Ambulatory Visit: Payer: Self-pay | Admitting: Family

## 2017-09-30 ENCOUNTER — Ambulatory Visit: Payer: Self-pay | Admitting: Pulmonary Disease

## 2017-10-24 ENCOUNTER — Ambulatory Visit (INDEPENDENT_AMBULATORY_CARE_PROVIDER_SITE_OTHER): Payer: Medicare HMO | Admitting: Pulmonary Disease

## 2017-10-24 ENCOUNTER — Encounter: Payer: Self-pay | Admitting: Pulmonary Disease

## 2017-10-24 VITALS — BP 122/84 | HR 87 | Ht 66.0 in | Wt 315.4 lb

## 2017-10-24 DIAGNOSIS — G4733 Obstructive sleep apnea (adult) (pediatric): Secondary | ICD-10-CM | POA: Diagnosis not present

## 2017-10-24 MED ORDER — ALBUTEROL SULFATE HFA 108 (90 BASE) MCG/ACT IN AERS: 2.0000 | INHALATION_SPRAY | Freq: Four times a day (QID) | RESPIRATORY_TRACT | 5 refills | Status: DC | PRN

## 2017-10-24 NOTE — Assessment & Plan Note (Signed)
Again not sure this is true asthma given normal spirometry but could be exercise-induced/cold air induced asthma Refills on albuterol.  Okay to stop using Qvar-he will call us if he has excessive use of albuterol

## 2017-10-24 NOTE — Patient Instructions (Signed)
Refill on albuterol CPAP 16 cm is working well - Rx for new nasal mask

## 2017-10-24 NOTE — Progress Notes (Signed)
   Subjective:    Patient ID: Jordan [name], male    DOB: 1981/06/20, 36 y.o.   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  He stopped using his Qvar and denies nocturnal wheezing.  He has been using nasal pillows with his CPAP machine, on auto settings.  He used to be on a full facemask.  His 38-year-old toddler still sleeps  in his bed. No problems with mask or pressure. CPAP download was reviewed and this shows average pressure of 16.6 cm with good control of events and minimal leak and excellent compliance    Significant tests/ events  NPSG 11/2014: AHI 23/hr, cpap to 11cm  Spirometry 09/2016 shows moderate airway restriction with ratio 83 and FVC 67% and FEV1 67%   Review of Systems neg for any significant sore throat, dysphagia, itching, sneezing, nasal congestion or excess/ purulent secretions, fever, chills, sweats, unintended wt loss, pleuritic or exertional cp, hempoptysis, orthopnea pnd or change in chronic leg swelling. Also denies presyncope, palpitations, heartburn, abdominal pain, nausea, vomiting, diarrhea or change in bowel or urinary habits, dysuria,hematuria, rash, arthralgias, visual complaints, headache, numbness weakness or ataxia.     Objective:   Physical Exam   Gen. Pleasant, obese, in no distress ENT - no lesions, no post nasal drip Neck: No JVD, no thyromegaly, no carotid bruits Lungs: no use of accessory muscles, no dullness to percussion, decreased without rales or rhonchi  Cardiovascular: Rhythm regular, heart sounds  normal,  no murmurs or gallops, no peripheral edema Musculoskeletal: No deformities, no cyanosis or clubbing , no tremors        Assessment & Plan:

## 2017-10-24 NOTE — Assessment & Plan Note (Signed)
Prescription will be sent for a new nasal mask and think this will work better than nasal pillows. Ct auto settings  Weight loss encouraged, compliance with goal of at least 4-6 hrs every night is the expectation. Advised against medications with sedative side effects Cautioned against driving when sleepy - understanding that sleepiness will vary on a day to day basis

## 2017-11-14 DIAGNOSIS — G4733 Obstructive sleep apnea (adult) (pediatric): Secondary | ICD-10-CM | POA: Diagnosis not present

## 2018-06-08 DIAGNOSIS — G4733 Obstructive sleep apnea (adult) (pediatric): Secondary | ICD-10-CM | POA: Diagnosis not present

## 2018-07-24 ENCOUNTER — Other Ambulatory Visit: Payer: Self-pay | Admitting: Family

## 2018-07-24 NOTE — Telephone Encounter (Signed)
[name] -- please advise Rx request. Pt has not been seen in our office since 12/2016 and has no future appts scheduled.

## 2018-07-28 DIAGNOSIS — H5213 Myopia, bilateral: Secondary | ICD-10-CM | POA: Diagnosis not present

## 2018-10-25 ENCOUNTER — Ambulatory Visit: Payer: Self-pay | Admitting: Adult Health

## 2018-11-06 ENCOUNTER — Encounter: Payer: Self-pay | Admitting: Adult Health

## 2018-11-06 ENCOUNTER — Ambulatory Visit (INDEPENDENT_AMBULATORY_CARE_PROVIDER_SITE_OTHER): Payer: Medicare HMO | Admitting: Adult Health

## 2018-11-06 DIAGNOSIS — J4520 Mild intermittent asthma, uncomplicated: Secondary | ICD-10-CM | POA: Diagnosis not present

## 2018-11-06 DIAGNOSIS — Z23 Encounter for immunization: Secondary | ICD-10-CM

## 2018-11-06 DIAGNOSIS — Z6841 Body Mass Index (BMI) 40.0 and over, adult: Secondary | ICD-10-CM | POA: Diagnosis not present

## 2018-11-06 DIAGNOSIS — G4733 Obstructive sleep apnea (adult) (pediatric): Secondary | ICD-10-CM

## 2018-11-06 NOTE — Assessment & Plan Note (Signed)
Controlled on CPAP  Plan  Patient Instructions  Continue on CPAP at bedtime Work on healthy weight Do not drive if sleepy.  Flu shot today  Follow up with Dr. Vassie Loll  In 1 year and As needed

## 2018-11-06 NOTE — Assessment & Plan Note (Signed)
Intermittent Asthma -controlled without flare  Albuterol As needed

## 2018-11-06 NOTE — Patient Instructions (Signed)
Continue on CPAP at bedtime Work on healthy weight Do not drive if sleepy.  Flu shot today  Follow up with Dr. Vassie Loll  In 1 year and As needed

## 2018-11-06 NOTE — Progress Notes (Signed)
@Patient  ID: Jordan [name], male    DOB: 18-Apr-1981, 37 y.o.   MRN: [id number]  Chief Complaint  Patient presents with  . Follow-up    OSA     Referring provider: Sandford Craze, NP  HPI: 37 year old male followed for obstructive sleep apnea and asthma  TEST/EVENTS :  NPSG 11/2014: AHI 23/hr, cpap to 11cm  Spirometry 09/2016 shows moderate airway restriction with ratio 83 and FVC 67% and FEV1 67%  11/06/2018 Follow-up: OSA and Asthma Patient presents for a one-year follow-up.  Patient has underlying moderate sleep apnea is on CPAP at bedtime.  He feels he is doing well . Feels rested with no significant daytime sleepiness.  Download shows good compliance at 73% usage.  Average daily usage at 7 hours.  Patient is on auto CPAP 5-27 was H2O.  AHI 2.2.  Minimum leaks. Using nasal mask , wants to change back to full face mask.   Patient has intermittent asthma.  His breathing has been doing okay with no flare of cough or wheezing.  Uses albuterol rarely.    Allergies  Allergen Reactions  . Butrans [Buprenorphine Hcl]     Difficulty breathing, n/v  . Nucynta [Tapentadol Hydrochloride] Anaphylaxis  . Sumac     poison    Immunization History  Administered Date(s) Administered  . Influenza Split 09/27/2011  . Influenza,inj,Quad PF,6+ Mos 10/24/2014, 09/17/2015, 10/01/2016, 11/06/2018  . Influenza-Unspecified 09/05/2017  . Td 04/26/2009    Past Medical History:  Diagnosis Date  . Anxiety and depression   . Chronic pain syndrome   . General medical examination   . HYPERTENSION, MILD   . OSTEOARTHRITIS     Tobacco History: Social History   Tobacco Use  Smoking Status Former Smoker  . Years: 2.00  . Types: Cigars  . Last attempt to quit: 01/18/2014  . Years since quitting: 4.8  Smokeless Tobacco Never Used   Counseling given: Not Answered   Outpatient Medications Prior to Visit  Medication Sig Dispense Refill  . albuterol (PROVENTIL  HFA;VENTOLIN HFA) 108 (90 Base) MCG/ACT inhaler Inhale 2 puffs into the lungs every 6 (six) hours as needed for wheezing or shortness of breath. 1 Inhaler 5  . aspirin 81 MG tablet Take 81 mg by mouth daily.    . cetirizine (ZYRTEC) 10 MG tablet Take 1 tablet (10 mg total) by mouth daily. 30 tablet 5  . diclofenac sodium (VOLTAREN) 1 % GEL Apply topically 4 (four) times daily.    Marland Kitchen L-Methylfolate-Algae-B12-B6 (METANX) 3-90.314-2-35 MG CAPS TAKE 1 CAPSULE BY MOUTH 2 TIMES DAILY. 60 capsule 5   No facility-administered medications prior to visit.      Review of Systems  Constitutional:   No  weight loss, night sweats,  Fevers, chills, fatigue, or  lassitude.  HEENT:   No headaches,  Difficulty swallowing,  Tooth/dental problems, or  Sore throat,                No sneezing, itching, ear ache, nasal congestion, post nasal drip,   CV:  No chest pain,  Orthopnea, PND, swelling in lower extremities, anasarca, dizziness, palpitations, syncope.   GI  No heartburn, indigestion, abdominal pain, nausea, vomiting, diarrhea, change in bowel habits, loss of appetite, bloody stools.   Resp: No shortness of breath with exertion or at rest.  No excess mucus, no productive cough,  No non-productive cough,  No coughing up of blood.  No change in color of mucus.  No wheezing.  No chest wall  deformity  Skin: no rash or lesions.  GU: no dysuria, change in color of urine, no urgency or frequency.  No flank pain, no hematuria   MS:  No joint pain or swelling.  No decreased range of motion.  No back pain.    Physical Exam  BP 126/72 (BP Location: Left Arm, Cuff Size: Large)   Pulse 88   Ht 5\' 6"  (1.676 m)   Wt (!) 309 lb 9.6 oz (140.4 kg)   SpO2 99%   BMI 49.97 kg/m   GEN: A/Ox3; pleasant , NAD, obese    HEENT:  [place]/AT,  EACs-clear, TMs-wnl, NOSE-clear, THROAT-clear, no lesions, no postnasal drip or exudate noted. Class 3-4 MP airway   NECK:  Supple w/ fair ROM; no JVD; normal carotid impulses w/o  bruits; no thyromegaly or nodules palpated; no lymphadenopathy.    RESP  Clear  P & A; w/o, wheezes/ rales/ or rhonchi. no accessory muscle use, no dullness to percussion  CARD:  RRR, no m/r/g, no peripheral edema, pulses intact, no cyanosis or clubbing.  GI:   Soft & nt; nml bowel sounds; no organomegaly or masses detected.   Musco: Warm bil, no deformities or joint swelling noted.   Neuro: alert, no focal deficits noted.    Skin: Warm, no lesions or rashes    Lab Results:   BNP No results found for: BNP  ProBNP No results found for: PROBNP  Imaging: No results found.    No flowsheet data found.  No results found for: NITRICOXIDE      Assessment & Plan:   OSA (obstructive sleep apnea) Controlled on CPAP  Plan  Patient Instructions  Continue on CPAP at bedtime Work on healthy weight Do not drive if sleepy.  Flu shot today  Follow up with Dr. Vassie Loll  In 1 year and As needed        Asthma Intermittent Asthma -controlled without flare  Albuterol As needed        Rubye Oaks, NP 11/06/2018

## 2018-11-06 NOTE — Addendum Note (Signed)
Addended by: Boone Master E on: 11/06/2018 04:57 PM   Modules accepted: Orders

## 2019-01-03 DIAGNOSIS — G4733 Obstructive sleep apnea (adult) (pediatric): Secondary | ICD-10-CM | POA: Diagnosis not present

## 2019-01-22 ENCOUNTER — Ambulatory Visit: Payer: Self-pay | Admitting: Family Medicine

## 2019-04-19 DIAGNOSIS — H5213 Myopia, bilateral: Secondary | ICD-10-CM | POA: Diagnosis not present

## 2019-04-19 DIAGNOSIS — H52209 Unspecified astigmatism, unspecified eye: Secondary | ICD-10-CM | POA: Diagnosis not present

## 2019-04-27 DIAGNOSIS — G4733 Obstructive sleep apnea (adult) (pediatric): Secondary | ICD-10-CM | POA: Diagnosis not present

## 2019-06-19 ENCOUNTER — Encounter: Payer: Self-pay | Admitting: Family Medicine

## 2019-06-19 ENCOUNTER — Ambulatory Visit (INDEPENDENT_AMBULATORY_CARE_PROVIDER_SITE_OTHER): Payer: Medicare HMO | Admitting: Family Medicine

## 2019-06-19 DIAGNOSIS — S8992XA Unspecified injury of left lower leg, initial encounter: Secondary | ICD-10-CM | POA: Diagnosis not present

## 2019-06-19 MED ORDER — MELOXICAM 15 MG PO TABS
15.0000 mg | ORAL_TABLET | Freq: Every day | ORAL | 0 refills | Status: DC
Start: 2019-06-19 — End: 2020-05-04
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
## 2019-06-26 ENCOUNTER — Other Ambulatory Visit: Payer: Self-pay

## 2019-06-27 ENCOUNTER — Ambulatory Visit (INDEPENDENT_AMBULATORY_CARE_PROVIDER_SITE_OTHER): Payer: Medicare HMO | Admitting: Family Medicine

## 2019-06-27 ENCOUNTER — Encounter: Payer: Self-pay | Admitting: Family Medicine

## 2019-06-27 ENCOUNTER — Ambulatory Visit (HOSPITAL_BASED_OUTPATIENT_CLINIC_OR_DEPARTMENT_OTHER)
Admission: RE | Admit: 2019-06-27 | Discharge: 2019-06-27 | Disposition: A | Discharge status: Home / Self Care | Payer: Medicare HMO | Source: Ambulatory Visit | Attending: Family Medicine | Admitting: Family Medicine

## 2019-06-27 VITALS — BP 140/90 | HR 71 | Temp 98.1°F | Resp 18 | Ht 66.0 in | Wt 303.0 lb

## 2019-06-27 DIAGNOSIS — Z23 Encounter for immunization: Secondary | ICD-10-CM

## 2019-06-27 DIAGNOSIS — M7989 Other specified soft tissue disorders: Secondary | ICD-10-CM | POA: Diagnosis not present

## 2019-06-27 DIAGNOSIS — Z6841 Body Mass Index (BMI) 40.0 and over, adult: Secondary | ICD-10-CM | POA: Diagnosis not present

## 2019-06-27 DIAGNOSIS — S8992XD Unspecified injury of left lower leg, subsequent encounter: Secondary | ICD-10-CM | POA: Diagnosis not present

## 2019-06-27 DIAGNOSIS — S8992XA Unspecified injury of left lower leg, initial encounter: Secondary | ICD-10-CM | POA: Diagnosis not present

## 2019-06-27 MED ORDER — ALBUTEROL SULFATE HFA 108 (90 BASE) MCG/ACT IN AERS: 2.0000 | INHALATION_SPRAY | Freq: Four times a day (QID) | RESPIRATORY_TRACT | 2 refills | Status: DC | PRN

## 2019-06-27 MED ORDER — METANX 3-90.314-2-35 MG PO CAPS: ORAL_CAPSULE | ORAL | 5 refills | Status: DC

## 2019-06-27 MED ORDER — DICLOFENAC SODIUM 1 % TD GEL: 2.0000 g | Freq: Four times a day (QID) | TRANSDERMAL | 1 refills | Status: DC

## 2019-06-27 MED ORDER — TRAMADOL HCL 50 MG PO TABS: 50.0000 mg | ORAL_TABLET | Freq: Three times a day (TID) | ORAL | 0 refills | Status: AC | PRN

## 2019-06-27 NOTE — Patient Instructions (Addendum)
Ice/cold pack over area for 10-15 min twice daily.  OK to take Tylenol 1000 mg (2 extra strength tabs) or 975 mg (3 regular strength tabs) every 6 hours as needed.  Ibuprofen 400-600 mg (2-3 over the counter strength tabs) every 6 hours as needed for pain.  We will be in touch regarding the X-ray. If you don't hear from [place], there are no significant changes with the read from what we discussed.   Do not drink alcohol, do any illicit/street drugs, drive or do anything that requires alertness while on the tramadol.  I have a boot with your name out it. Call your insurance company to find out how much a walker boot would cover. It may count toward your deductible.  Let us know if you need anything.
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
## 2020-01-08 ENCOUNTER — Other Ambulatory Visit: Payer: Self-pay

## 2020-01-08 ENCOUNTER — Emergency Department (HOSPITAL_BASED_OUTPATIENT_CLINIC_OR_DEPARTMENT_OTHER)
Admission: EM | Admit: 2020-01-08 | Discharge: 2020-01-08 | Disposition: A | Discharge status: Home / Self Care | Payer: Medicare HMO | Attending: Emergency Medicine | Admitting: Emergency Medicine

## 2020-01-08 ENCOUNTER — Telehealth: Payer: Self-pay | Admitting: Pulmonary Disease

## 2020-01-08 ENCOUNTER — Encounter (HOSPITAL_BASED_OUTPATIENT_CLINIC_OR_DEPARTMENT_OTHER): Payer: Self-pay | Admitting: *Deleted

## 2020-01-08 DIAGNOSIS — J45909 Unspecified asthma, uncomplicated: Secondary | ICD-10-CM | POA: Insufficient documentation

## 2020-01-08 DIAGNOSIS — Z7982 Long term (current) use of aspirin: Secondary | ICD-10-CM | POA: Diagnosis not present

## 2020-01-08 DIAGNOSIS — W500XXA Accidental hit or strike by another person, initial encounter: Secondary | ICD-10-CM | POA: Insufficient documentation

## 2020-01-08 DIAGNOSIS — I10 Essential (primary) hypertension: Secondary | ICD-10-CM | POA: Diagnosis not present

## 2020-01-08 DIAGNOSIS — Z87891 Personal history of nicotine dependence: Secondary | ICD-10-CM | POA: Insufficient documentation

## 2020-01-08 DIAGNOSIS — H1131 Conjunctival hemorrhage, right eye: Secondary | ICD-10-CM | POA: Insufficient documentation

## 2020-01-08 DIAGNOSIS — Y929 Unspecified place or not applicable: Secondary | ICD-10-CM | POA: Insufficient documentation

## 2020-01-08 DIAGNOSIS — H5711 Ocular pain, right eye: Secondary | ICD-10-CM | POA: Diagnosis present

## 2020-01-08 DIAGNOSIS — Y999 Unspecified external cause status: Secondary | ICD-10-CM | POA: Diagnosis not present

## 2020-01-08 DIAGNOSIS — Y9389 Activity, other specified: Secondary | ICD-10-CM | POA: Diagnosis not present

## 2020-01-08 DIAGNOSIS — S0501XA Injury of conjunctiva and corneal abrasion without foreign body, right eye, initial encounter: Secondary | ICD-10-CM | POA: Diagnosis not present

## 2020-01-08 DIAGNOSIS — R519 Headache, unspecified: Secondary | ICD-10-CM | POA: Diagnosis not present

## 2020-01-08 DIAGNOSIS — G4733 Obstructive sleep apnea (adult) (pediatric): Secondary | ICD-10-CM

## 2020-01-08 MED ORDER — CIPROFLOXACIN HCL 0.3 % OP SOLN
1.0000 [drp] | Freq: Once | OPHTHALMIC | Status: AC
  Administered 2020-01-08: 1 [drp] via OPHTHALMIC
  Filled 2020-01-08: qty 2.5

## 2020-01-08 MED ORDER — TETRACAINE HCL 0.5 % OP SOLN
2.0000 [drp] | Freq: Once | OPHTHALMIC | Status: AC
  Administered 2020-01-08: 2 [drp] via OPHTHALMIC
  Filled 2020-01-08: qty 4

## 2020-01-08 MED ORDER — FLUORESCEIN SODIUM 1 MG OP STRP
1.0000 | ORAL_STRIP | Freq: Once | OPHTHALMIC | Status: AC
  Administered 2020-01-08: 1 via OPHTHALMIC
  Filled 2020-01-08: qty 1

## 2020-01-08 NOTE — Telephone Encounter (Signed)
Pt's wife called and states the pts CPAP is malfunctioning. Stating the machine is blowing out too much pressure at times. Pt is requesting a new CPAP machine as his current one is over 41 years old. Pt last seen in late 2019 by TP and advised to come back in a year. Appt made with Dr. Vassie Loll for 01/24/2020. Pt is requesting to see if RA would send order in now for a new CPAP while waiting for his follow up.   Dr. Vassie Loll please advise. Thanks.
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
## 2020-01-08 NOTE — Discharge Instructions (Addendum)
Administer 1 to 2 drops in your right eye every 2 hours while awake for the next 2 days, then every 4-8 hours for 5 days. You can apply a cool compress to your eye for symptom management as well as take over-the-counter medications as needed. Do not wear your contact lenses for the next week. Follow-up with your eye doctor or the ophthalmologist as referred to ensure proper healing. Return to the emergency department if you develop a yellow/greenish drainage from your eye, vision loss, or severely worsening symptoms.
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
## 2020-01-09 DIAGNOSIS — G4733 Obstructive sleep apnea (adult) (pediatric): Secondary | ICD-10-CM | POA: Diagnosis not present

## 2020-01-09 NOTE — Telephone Encounter (Signed)
Pt wife returning call and can be reached @ [phone].Caren Griffins '

## 2020-01-09 NOTE — Telephone Encounter (Signed)
lmtcb for [name].  

## 2020-01-09 NOTE — Telephone Encounter (Signed)
Rx for autoCPAP 10-18 cm

## 2020-01-09 NOTE — Telephone Encounter (Signed)
[name] returned call. Informed her of the recs per RA. Order placed. [name] verbalized understanding and denied any further questions or concerns at this time.

## 2020-01-24 ENCOUNTER — Telehealth: Payer: Medicare HMO | Admitting: Pulmonary Disease

## 2020-01-25 ENCOUNTER — Telehealth: Payer: Self-pay | Admitting: Pulmonary Disease

## 2020-01-25 NOTE — Telephone Encounter (Signed)
Spoke with [name], according to the chart the order was received by [facility] and they should be processing the order. I advised her that the call center doesn't see the orders right away and to give them until next week. In the meantime I send a message to Durward Fortes at [place] to check on status. Will await message from her to update status on order.

## 2020-02-08 DIAGNOSIS — G4733 Obstructive sleep apnea (adult) (pediatric): Secondary | ICD-10-CM | POA: Diagnosis not present

## 2020-02-19 ENCOUNTER — Ambulatory Visit (INDEPENDENT_AMBULATORY_CARE_PROVIDER_SITE_OTHER): Payer: Medicare HMO | Admitting: Pulmonary Disease

## 2020-02-19 ENCOUNTER — Other Ambulatory Visit: Payer: Self-pay

## 2020-02-19 ENCOUNTER — Encounter: Payer: Self-pay | Admitting: Pulmonary Disease

## 2020-02-19 DIAGNOSIS — J4520 Mild intermittent asthma, uncomplicated: Secondary | ICD-10-CM | POA: Diagnosis not present

## 2020-02-19 DIAGNOSIS — G4733 Obstructive sleep apnea (adult) (pediatric): Secondary | ICD-10-CM

## 2020-02-19 NOTE — Assessment & Plan Note (Signed)
We will review download from his new Respironics machine.  He does not want ramp function and is settling down with 10 to 18 cm. I discussed using AirFit F30 full facemask which would be a minimalist mask for him, otherwise he will continue nasal interface with chinstrap.  Weight loss encouraged, compliance with goal of at least 4-6 hrs every night is the expectation. Advised against medications with sedative side effects Cautioned against driving when sleepy - understanding that sleepiness will vary on a day to day basis

## 2020-02-19 NOTE — Progress Notes (Signed)
I connected with  Isaic Syler Bucks III on 02/19/20 by phone and verified that I am speaking with the correct person using two identifiers.   I discussed the limitations of evaluation and management by telemedicine. The patient expressed understanding and agreed to proceed.  39 yo for FU of OSA and exercise/allergy induced asthma -strong f/h/o CAD  PMH -obesity, anxiety  He just obtained his new Respironics machine a week ago.  This was set on auto 10 to 18 cm, he is adjusting to the machine starting at 10 cm but feels this is okay and does not need ramp function. We reviewed download on his old ResMed machine 5 to 20 cm with average pressure of 18 cm good control of events and good compliance with minimal leak He prefers nasal interface with chinstrap but also has full facemask Wife has not noted any snoring and he denies daytime somnolence or fatigue.  [place] and has needed albuterol MDI about twice a week, his Qvar was stopped 2 years ago.     Significant tests/ events  NPSG 11/2014: AHI 23/hr, cpap to 11cm  Spirometry 09/2016 shows moderate airway restriction with ratio 83 and FVC 67% and FEV1 67%    Patient denies significant dyspnea,cough, hemoptysis,  chest pain, palpitations, pedal edema, orthopnea, paroxysmal nocturnal dyspnea, lightheadedness, nausea, vomiting, abdominal or  leg pains     Total encounter time x 21 m

## 2020-02-19 NOTE — Assessment & Plan Note (Signed)
Continue albuterol on an as-needed basis. Asked him to use Zyrtec during allergy season

## 2020-02-26 ENCOUNTER — Telehealth: Payer: Self-pay | Admitting: Pulmonary Disease

## 2020-02-26 NOTE — Telephone Encounter (Signed)
I called Jordan [name] but she didn't answer. I left a VM advising her to call us back. Will await return call.

## 2020-02-26 NOTE — Telephone Encounter (Signed)
Dr. Vassie Loll, please advise on this for pt and [facility].

## 2020-02-26 NOTE — Telephone Encounter (Signed)
Not sure where that order came from Stay on 10-18 per my last note

## 2020-02-27 NOTE — Telephone Encounter (Signed)
Called and spoke to Bagley with [facility]. Advised her to keep the pressure setting at 10-18cm H20 per Dr. Vassie Loll. Kim verbalized understanding and denied any further questions or concerns at this time.

## 2020-03-07 DIAGNOSIS — G4733 Obstructive sleep apnea (adult) (pediatric): Secondary | ICD-10-CM | POA: Diagnosis not present

## 2020-04-07 DIAGNOSIS — G4733 Obstructive sleep apnea (adult) (pediatric): Secondary | ICD-10-CM | POA: Diagnosis not present

## 2020-04-09 DIAGNOSIS — G4733 Obstructive sleep apnea (adult) (pediatric): Secondary | ICD-10-CM | POA: Diagnosis not present

## 2020-05-04 ENCOUNTER — Other Ambulatory Visit: Payer: Self-pay

## 2020-05-04 ENCOUNTER — Emergency Department (HOSPITAL_BASED_OUTPATIENT_CLINIC_OR_DEPARTMENT_OTHER): Payer: Medicare HMO

## 2020-05-04 ENCOUNTER — Encounter: Payer: Self-pay | Admitting: Emergency Medicine

## 2020-05-04 ENCOUNTER — Encounter (HOSPITAL_BASED_OUTPATIENT_CLINIC_OR_DEPARTMENT_OTHER): Payer: Self-pay

## 2020-05-04 ENCOUNTER — Ambulatory Visit
Admission: EM | Admit: 2020-05-04 | Discharge: 2020-05-04 | Disposition: A | Discharge status: Home / Self Care | Payer: Medicare HMO | Source: Home / Self Care

## 2020-05-04 ENCOUNTER — Emergency Department (HOSPITAL_BASED_OUTPATIENT_CLINIC_OR_DEPARTMENT_OTHER)
Admission: EM | Admit: 2020-05-04 | Discharge: 2020-05-04 | Disposition: A | Discharge status: Home / Self Care | Payer: Medicare HMO | Attending: Emergency Medicine | Admitting: Emergency Medicine

## 2020-05-04 DIAGNOSIS — K5792 Diverticulitis of intestine, part unspecified, without perforation or abscess without bleeding: Secondary | ICD-10-CM | POA: Insufficient documentation

## 2020-05-04 DIAGNOSIS — I861 Scrotal varices: Secondary | ICD-10-CM | POA: Diagnosis not present

## 2020-05-04 DIAGNOSIS — I10 Essential (primary) hypertension: Secondary | ICD-10-CM | POA: Insufficient documentation

## 2020-05-04 DIAGNOSIS — N5089 Other specified disorders of the male genital organs: Secondary | ICD-10-CM | POA: Insufficient documentation

## 2020-05-04 DIAGNOSIS — R319 Hematuria, unspecified: Secondary | ICD-10-CM | POA: Diagnosis not present

## 2020-05-04 DIAGNOSIS — R1030 Lower abdominal pain, unspecified: Secondary | ICD-10-CM | POA: Diagnosis present

## 2020-05-04 DIAGNOSIS — Z79899 Other long term (current) drug therapy: Secondary | ICD-10-CM | POA: Diagnosis not present

## 2020-05-04 DIAGNOSIS — N50812 Left testicular pain: Secondary | ICD-10-CM | POA: Insufficient documentation

## 2020-05-04 DIAGNOSIS — Z87442 Personal history of urinary calculi: Secondary | ICD-10-CM | POA: Insufficient documentation

## 2020-05-04 DIAGNOSIS — N433 Hydrocele, unspecified: Secondary | ICD-10-CM | POA: Diagnosis not present

## 2020-05-04 DIAGNOSIS — R102 Pelvic and perineal pain: Secondary | ICD-10-CM | POA: Insufficient documentation

## 2020-05-04 DIAGNOSIS — Z87891 Personal history of nicotine dependence: Secondary | ICD-10-CM | POA: Diagnosis not present

## 2020-05-04 DIAGNOSIS — R310 Gross hematuria: Secondary | ICD-10-CM | POA: Insufficient documentation

## 2020-05-04 DIAGNOSIS — N3080 Other cystitis without hematuria: Secondary | ICD-10-CM | POA: Insufficient documentation

## 2020-05-04 DIAGNOSIS — N50811 Right testicular pain: Secondary | ICD-10-CM | POA: Insufficient documentation

## 2020-05-04 DIAGNOSIS — J45909 Unspecified asthma, uncomplicated: Secondary | ICD-10-CM | POA: Insufficient documentation

## 2020-05-04 DIAGNOSIS — Z7982 Long term (current) use of aspirin: Secondary | ICD-10-CM | POA: Insufficient documentation

## 2020-05-04 DIAGNOSIS — N3289 Other specified disorders of bladder: Secondary | ICD-10-CM | POA: Diagnosis not present

## 2020-05-04 LAB — POCT URINALYSIS DIP (MANUAL ENTRY)
Bilirubin, UA: NEGATIVE
Glucose, UA: NEGATIVE mg/dL
Ketones, POC UA: NEGATIVE mg/dL
Nitrite, UA: NEGATIVE
Spec Grav, UA: 1.01 (ref 1.010–1.025)
Urobilinogen, UA: 0.2 E.U./dL
pH, UA: 7 (ref 5.0–8.0)

## 2020-05-04 LAB — CBC WITH DIFFERENTIAL/PLATELET
Abs Immature Granulocytes: 0.08 10*3/uL — ABNORMAL HIGH (ref 0.00–0.07)
Basophils Absolute: 0 10*3/uL (ref 0.0–0.1)
Basophils Relative: 0 %
Eosinophils Absolute: 0.2 10*3/uL (ref 0.0–0.5)
Eosinophils Relative: 1 %
HCT: 44.5 % (ref 39.0–52.0)
Hemoglobin: 14 g/dL (ref 13.0–17.0)
Immature Granulocytes: 1 %
Lymphocytes Relative: 17 %
Lymphs Abs: 2.2 10*3/uL (ref 0.7–4.0)
MCH: 26.6 pg (ref 26.0–34.0)
MCHC: 31.5 g/dL (ref 30.0–36.0)
MCV: 84.4 fL (ref 80.0–100.0)
Monocytes Absolute: 1.1 10*3/uL — ABNORMAL HIGH (ref 0.1–1.0)
Monocytes Relative: 9 %
Neutro Abs: 9.6 10*3/uL — ABNORMAL HIGH (ref 1.7–7.7)
Neutrophils Relative %: 72 %
Platelets: 262 10*3/uL (ref 150–400)
RBC: 5.27 MIL/uL (ref 4.22–5.81)
RDW: 14.8 % (ref 11.5–15.5)
WBC: 13.3 10*3/uL — ABNORMAL HIGH (ref 4.0–10.5)
nRBC: 0 % (ref 0.0–0.2)

## 2020-05-04 LAB — URINALYSIS, ROUTINE W REFLEX MICROSCOPIC
Bilirubin Urine: NEGATIVE
Glucose, UA: NEGATIVE mg/dL
Ketones, ur: NEGATIVE mg/dL
Nitrite: NEGATIVE
Protein, ur: 30 mg/dL — AB
Specific Gravity, Urine: <1.005 — ABNORMAL LOW (ref 1.005–1.030)
pH: 6.5 (ref 5.0–8.0)

## 2020-05-04 LAB — BASIC METABOLIC PANEL
Anion gap: 9 (ref 5–15)
BUN: 5 mg/dL — ABNORMAL LOW (ref 6–20)
CO2: 25 mmol/L (ref 22–32)
Calcium: 8.7 mg/dL — ABNORMAL LOW (ref 8.9–10.3)
Chloride: 103 mmol/L (ref 98–111)
Creatinine, Ser: 0.9 mg/dL (ref 0.61–1.24)
GFR calc Af Amer: >60 mL/min (ref >60)
GFR calc non Af Amer: >60 mL/min (ref >60)
Glucose, Bld: 126 mg/dL — ABNORMAL HIGH (ref 70–99)
Potassium: 3.5 mmol/L (ref 3.5–5.1)
Sodium: 137 mmol/L (ref 135–145)

## 2020-05-04 LAB — URINALYSIS, MICROSCOPIC (REFLEX): WBC, UA: >50 WBC/hpf (ref 0–5)

## 2020-05-04 MED ORDER — ONDANSETRON HCL 4 MG/2ML IJ SOLN
4.0000 mg | Freq: Once | INTRAMUSCULAR | Status: AC
  Administered 2020-05-04: 4 mg via INTRAVENOUS
  Filled 2020-05-04: qty 2

## 2020-05-04 MED ORDER — SODIUM CHLORIDE 0.9 % IV BOLUS
1000.0000 mL | Freq: Once | INTRAVENOUS | Status: AC
  Administered 2020-05-04: 1000 mL via INTRAVENOUS

## 2020-05-04 MED ORDER — HYDROMORPHONE HCL 1 MG/ML IJ SOLN
1.0000 mg | Freq: Once | INTRAMUSCULAR | Status: AC
  Administered 2020-05-04: 1 mg via INTRAVENOUS
  Filled 2020-05-04: qty 1

## 2020-05-04 MED ORDER — HYDROCODONE-ACETAMINOPHEN 5-325 MG PO TABS: 1.0000 | ORAL_TABLET | ORAL | 0 refills | Status: DC | PRN

## 2020-05-04 MED ORDER — FENTANYL CITRATE (PF) 100 MCG/2ML IJ SOLN
50.0000 ug | Freq: Once | INTRAMUSCULAR | Status: AC
  Administered 2020-05-04: 50 ug via INTRAVENOUS
  Filled 2020-05-04: qty 2

## 2020-05-04 MED ORDER — METRONIDAZOLE 500 MG PO TABS
500.0000 mg | ORAL_TABLET | Freq: Once | ORAL | Status: AC
  Administered 2020-05-04: 500 mg via ORAL
  Filled 2020-05-04: qty 1

## 2020-05-04 MED ORDER — ONDANSETRON 4 MG PO TBDP: 4.0000 mg | ORAL_TABLET | Freq: Three times a day (TID) | ORAL | 0 refills | Status: DC | PRN

## 2020-05-04 MED ORDER — SODIUM CHLORIDE 0.9 % IV SOLN
INTRAVENOUS | Status: AC
  Filled 2020-05-04: qty 20

## 2020-05-04 MED ORDER — METRONIDAZOLE 500 MG PO TABS: 500.0000 mg | ORAL_TABLET | Freq: Two times a day (BID) | ORAL | 0 refills | Status: DC

## 2020-05-04 MED ORDER — CIPROFLOXACIN HCL 500 MG PO TABS: 500.0000 mg | ORAL_TABLET | Freq: Two times a day (BID) | ORAL | 0 refills | Status: AC

## 2020-05-04 MED ORDER — SODIUM CHLORIDE 0.9 % IV SOLN
2.0000 g | Freq: Once | INTRAVENOUS | Status: AC
  Administered 2020-05-04: 2 g via INTRAVENOUS

## 2020-05-04 NOTE — ED Notes (Signed)
In [place], will update vitals once he returns to room

## 2020-05-04 NOTE — ED Triage Notes (Signed)
Pt c/o lower pelvic pain with urinary frequency, urgency, pain on urinary at end of the stream with a sound/blood/foam and states pain is worse now.

## 2020-05-04 NOTE — ED Triage Notes (Signed)
Pt c/o suprapubic pain, hematuria, swelling to R testicle and pain. Symptoms started yesterday. Pt sent from [facility] for an US of the testicle.

## 2020-05-04 NOTE — ED Provider Notes (Signed)
[facility]    CSN: [id number] Arrival date & time: 05/04/20  1345      History   Chief Complaint Chief Complaint  Patient presents with  . Urinary Tract Infection    HPI Jordan [name] is a 39 y.o. male with history of hypertension, obesity presenting for lower pelvic pain since last night.  Endorsing urinary frequency, urgency, and pain to end of urinary stream.  Endorsing hematuria, foamy urine.  Denies testicular pain or swelling, penile discharge.  No fever, back pain.   Past Medical History:  Diagnosis Date  . Anxiety and depression   . Chronic pain syndrome   . General medical examination   . HYPERTENSION, MILD   . OSTEOARTHRITIS     Patient Active Problem List   Diagnosis Date Noted  . Asthma 10/01/2016  . Morbid obesity (HCC) 02/06/2015  . OSA (obstructive sleep apnea) 10/24/2014  . Anxiety and depression 01/21/2010  . CHRONIC PAIN SYNDROME 01/21/2010  . HYPERTENSION, MILD 01/21/2010  . Osteoarthritis 01/21/2010    Past Surgical History:  Procedure Laterality Date  . KNEE SURGERY Right        Home Medications    Prior to Admission medications   Medication Sig Start Date End Date Taking? Authorizing Provider  albuterol (VENTOLIN HFA) 108 (90 Base) MCG/ACT inhaler Inhale 2 puffs into the lungs every 6 (six) hours as needed for wheezing or shortness of breath. 06/27/19   Sharlene Dory, DO  aspirin 81 MG tablet Take 81 mg by mouth daily.    [provider]  cetirizine (ZYRTEC) 10 MG tablet Take 1 tablet (10 mg total) by mouth daily. 03/05/15   Sandford Craze, NP  diclofenac sodium (VOLTAREN) 1 % GEL Apply 2 g topically 4 (four) times daily. 06/27/19   Sharlene Dory, DO  L-Methylfolate-Algae-B12-B6 (METANX) 3-90.314-2-35 MG CAPS TAKE 1 CAPSULE BY MOUTH 2 TIMES DAILY. 06/27/19   Sharlene Dory, DO    Family History Family History  Problem Relation Age of Onset  . Heart disease Father   .  Heart disease Paternal Grandfather   . Heart disease Paternal Grandmother   . Heart disease Paternal Uncle   . Diabetes Paternal Uncle   . Diabetes Paternal Aunt   . Hypertension Maternal Uncle   . Cancer Maternal Aunt        lung  . Cancer Maternal Uncle        lung    Social History Social History   Tobacco Use  . Smoking status: Former Smoker    Years: 2.00    Types: Cigars    Quit date: 01/18/2014    Years since quitting: 6.2  . Smokeless tobacco: Never Used  Substance Use Topics  . Alcohol use: Yes    Alcohol/week: 0.0 standard drinks    Comment: occasional  . Drug use: No     Allergies   Butrans [buprenorphine hcl], Nucynta [tapentadol hydrochloride], Tapentadol hcl, Glucose polymer, and Sumac   Review of Systems As per HPI   Physical Exam Triage Vital Signs ED Triage Vitals  Enc Vitals Group     BP      Pulse      Resp      Temp      Temp src      SpO2      Weight      Height      Head Circumference      Peak Flow      Pain  Score      Pain Loc      Pain Edu?      Excl. in [place]?    No data found.  Updated Vital Signs BP (!) 140/92 (BP Location: Left Arm)   Pulse 87   Temp 99 F (37.2 C) (Oral)   Resp 18   SpO2 96%   Visual Acuity Right Eye Distance:   Left Eye Distance:   Bilateral Distance:    Right Eye Near:   Left Eye Near:    Bilateral Near:     Physical Exam Constitutional:      General: He is not in acute distress. HENT:     Head: Normocephalic and atraumatic.  Eyes:     General: No scleral icterus.    Pupils: Pupils are equal, round, and reactive to light.  Cardiovascular:     Rate and Rhythm: Normal rate.  Pulmonary:     Effort: Pulmonary effort is normal. No respiratory distress.     Breath sounds: No wheezing.  Abdominal:     General: Bowel sounds are normal.     Palpations: Abdomen is soft.     Tenderness: There is abdominal tenderness.     Comments: Suprapubic tenderness  Genitourinary:    Penis:  Uncircumcised. Tenderness present. No phimosis, paraphimosis, erythema or discharge.      Testes:        Right: Tenderness and swelling present.        Left: Tenderness present.     Epididymis:     Right: Tenderness present.     Left: Tenderness present.     Tanner stage (genital): 5.     Comments: Patient declined DRE Skin:    Coloration: Skin is not jaundiced or pale.  Neurological:     Mental Status: He is alert and oriented to person, place, and time.      UC Treatments / Results  Labs (all labs ordered are listed, but only abnormal results are displayed) Labs Reviewed  POCT URINALYSIS DIP (MANUAL ENTRY) - Abnormal; Notable for the following components:      Result Value   Color, UA light yellow (*)    Clarity, UA hazy (*)    Blood, UA large (*)    Protein Ur, POC trace (*)    Leukocytes, UA Large (3+) (*)    All other components within normal limits    EKG   Radiology No results found.  Procedures Procedures (including critical care time)  Medications Ordered in UC Medications - No data to display  Initial Impression / Assessment and Plan / UC Course  I have reviewed the triage vital signs and the nursing notes.  Pertinent labs & imaging results that were available during my care of the patient were reviewed by me and considered in my medical decision making (see chart for details).     Patient afebrile, nontoxic in office today.  Urine dipstick done in office: Significant for leukocytes, protein, blood, hazy appearance.  Patient has had UTI, the years ago.  Currently sexually active with 1 male partner, not using condoms.  Denying discharge and has low concern for STI.  DDx including UTI, pyelonephritis, renal calculi, epididymitis, testicular torsion, hydrocele, inguinal hernia.  Given testicular pain and swelling on exam, referred patient to ER for further work-up .  Return precautions discussed, patient verbalized understanding and is agreeable to plan.   Patient electing to self transport in stable condition. Final Clinical Impressions(s) / UC Diagnoses   Final diagnoses:  Testicular pain, left  Testicular pain, right  Testicular swelling, right  Suprapubic pain  Gross hematuria  History of kidney stones   Discharge Instructions   None    ED Prescriptions    None     PDMP not reviewed this encounter.   Hall[place]Potvin, [place], [place] 05/04/20 1423

## 2020-05-04 NOTE — Discharge Instructions (Addendum)
Your CT imaging showed you have a urinary tract infection as well as an infection in your colon.  I have sent a home with 2 antibiotics.  Take these as prescribed.  Have also sent you a prescription for pain medicine and antinausea medicine.  It is important you follow-up with urology as well as your primary care providers.  Return for any worsening symptoms

## 2020-05-04 NOTE — ED Provider Notes (Signed)
[facility] EMERGENCY DEPARTMENT Provider Note   CSN: [id number] Arrival date & time: 05/04/20  1450    History Testicular pain   Jordan [name] is a 39 y.o. male with past medical history significant for chronic pain syndrome, hypertension, asthma, renal stones who presents for evaluation of pain.  Patient states today he developed suprapubic abdominal pain as well as hematuria.  Was seen by urgent care.  Noted to have hematuria as well as some tenderness to his bilateral testicles.  Patient states he had not noticed any erythema or tenderness to his testicles until he was seen by urgent care.  Sexually active with his wife only.  Does not use protection.  Symptoms started yesterday.  Has not taken anything for symptoms.  Rates his pain a 8/10.  Does have history of UTIs.  Denies fever, chills, nausea, vomiting, chest pain, shortness of breath, penile discharge, pain with bowel movements.  Denies aggravating or alleviating factors.  No prior history of STDs.  History obtained from patient and past medical records.  No interpreter is used.  HPI     Past Medical History:  Diagnosis Date  . Anxiety and depression   . Chronic pain syndrome   . General medical examination   . HYPERTENSION, MILD   . OSTEOARTHRITIS     Patient Active Problem List   Diagnosis Date Noted  . Asthma 10/01/2016  . Morbid obesity (HCC) 02/06/2015  . OSA (obstructive sleep apnea) 10/24/2014  . Anxiety and depression 01/21/2010  . CHRONIC PAIN SYNDROME 01/21/2010  . HYPERTENSION, MILD 01/21/2010  . Osteoarthritis 01/21/2010    Past Surgical History:  Procedure Laterality Date  . KNEE SURGERY Right        Family History  Problem Relation Age of Onset  . Heart disease Father   . Heart disease Paternal Grandfather   . Heart disease Paternal Grandmother   . Heart disease Paternal Uncle   . Diabetes Paternal Uncle   . Diabetes Paternal Aunt   . Hypertension Maternal Uncle     . Cancer Maternal Aunt        lung  . Cancer Maternal Uncle        lung    Social History   Tobacco Use  . Smoking status: Former Smoker    Years: 2.00    Types: Cigars    Quit date: 01/18/2014    Years since quitting: 6.2  . Smokeless tobacco: Never Used  Substance Use Topics  . Alcohol use: Yes    Alcohol/week: 0.0 standard drinks    Comment: occasional  . Drug use: No    Home Medications Prior to Admission medications   Medication Sig Start Date End Date Taking? Authorizing Provider  albuterol (VENTOLIN HFA) 108 (90 Base) MCG/ACT inhaler Inhale 2 puffs into the lungs every 6 (six) hours as needed for wheezing or shortness of breath. 06/27/19   Sharlene Dory, DO  aspirin 81 MG tablet Take 81 mg by mouth daily.    [provider]  cetirizine (ZYRTEC) 10 MG tablet Take 1 tablet (10 mg total) by mouth daily. 03/05/15   Sandford Craze, NP  ciprofloxacin (CIPRO) 500 MG tablet Take 1 tablet (500 mg total) by mouth every 12 (twelve) hours for 7 days. 05/04/20 05/11/20  Slayde Brault A, PA-C  diclofenac sodium (VOLTAREN) 1 % GEL Apply 2 g topically 4 (four) times daily. 06/27/19   Sharlene Dory, DO  HYDROcodone-acetaminophen (NORCO/VICODIN) 5-325 MG tablet Take 1 tablet by  mouth every 4 (four) hours as needed. 05/04/20   Kitt Ledet A, PA-C  L-Methylfolate-Algae-B12-B6 (METANX) 3-90.314-2-35 MG CAPS TAKE 1 CAPSULE BY MOUTH 2 TIMES DAILY. 06/27/19   Sharlene DoryWendling, Nicholas Paul, DO  metroNIDAZOLE (FLAGYL) 500 MG tablet Take 1 tablet (500 mg total) by mouth 2 (two) times daily. 05/04/20   Khadir Roam A, PA-C  ondansetron (ZOFRAN ODT) 4 MG disintegrating tablet Take 1 tablet (4 mg total) by mouth every 8 (eight) hours as needed for nausea or vomiting. 05/04/20   Riko Lumsden A, PA-C    Allergies    Butrans [buprenorphine hcl], Nucynta [tapentadol hydrochloride], Tapentadol hcl, Glucose polymer, and Sumac  Review of Systems   Review of Systems   Constitutional: Negative.   HENT: Negative.   Respiratory: Negative.   Gastrointestinal: Positive for abdominal pain. Negative for abdominal distention, anal bleeding, blood in stool, constipation, diarrhea, nausea, rectal pain and vomiting.  Genitourinary: Positive for dysuria and flank pain.  Skin: Negative.   Neurological: Negative.   All other systems reviewed and are negative.   Physical Exam Updated Vital Signs BP 138/74   Pulse 79   Temp 98.5 F (36.9 C) (Oral)   Resp 18   Ht 5\' 6"  (1.676 m)   Wt (!) 145.2 kg   SpO2 98%   BMI 51.65 kg/m   Physical Exam Vitals and nursing note reviewed. Exam conducted with a chaperone present.  Constitutional:      General: He is not in acute distress.    Appearance: He is well-developed. He is not ill-appearing, toxic-appearing or diaphoretic.  HENT:     Head: Normocephalic and atraumatic.     Nose: Nose normal.     Mouth/Throat:     Mouth: Mucous membranes are moist.  Eyes:     Pupils: Pupils are equal, round, and reactive to light.  Cardiovascular:     Rate and Rhythm: Normal rate and regular rhythm.     Pulses: Normal pulses.     Heart sounds: Normal heart sounds.  Pulmonary:     Effort: Pulmonary effort is normal. No respiratory distress.     Breath sounds: Normal breath sounds.  Abdominal:     General: Bowel sounds are normal. There is no distension.     Palpations: Abdomen is soft.     Tenderness: There is abdominal tenderness in the suprapubic area. There is right CVA tenderness. There is no left CVA tenderness, guarding or rebound. Negative signs include Murphy's sign and McBurney's sign.     Hernia: No hernia is present.  Genitourinary:    Penis: Normal. No phimosis, paraphimosis, hypospadias, erythema, tenderness, discharge, swelling or lesions.      Testes: Cremasteric reflex is present.        Right: Tenderness and testicular hydrocele present.        Left: Tenderness present. Testicular hydrocele not present.      Epididymis:     Right: Normal.     Left: Normal.     Comments: Minimal tenderness to bilateral testicles however no edema, erythema or warmth.  History of reflex present. Declined rectal exam. Musculoskeletal:        General: Normal range of motion.     Cervical back: Normal range of motion and neck supple.  Skin:    General: Skin is warm and dry.     Capillary Refill: Capillary refill takes less than 2 seconds.     Comments: Brisk capillary refill  Neurological:     Mental Status: He is  alert.    ED Results / Procedures / Treatments   Labs (all labs ordered are listed, but only abnormal results are displayed) Labs Reviewed  URINALYSIS, ROUTINE W REFLEX MICROSCOPIC - Abnormal; Notable for the following components:      Result Value   APPearance HAZY (*)    Specific Gravity, Urine <1.005 (*)    Hgb urine dipstick LARGE (*)    Protein, ur 30 (*)    Leukocytes,Ua LARGE (*)    All other components within normal limits  CBC WITH DIFFERENTIAL/PLATELET - Abnormal; Notable for the following components:   WBC 13.3 (*)    Neutro Abs 9.6 (*)    Monocytes Absolute 1.1 (*)    Abs Immature Granulocytes 0.08 (*)    All other components within normal limits  BASIC METABOLIC PANEL - Abnormal; Notable for the following components:   Glucose, Bld 126 (*)    BUN 5 (*)    Calcium 8.7 (*)    All other components within normal limits  URINALYSIS, MICROSCOPIC (REFLEX) - Abnormal; Notable for the following components:   Bacteria, UA FEW (*)    All other components within normal limits  URINE CULTURE    EKG None  Radiology CT Renal Stone Study  Result Date: 05/04/2020 CLINICAL DATA:  Hematuria x1 day. Back pain. History of renal stones. EXAM: CT ABDOMEN AND PELVIS WITHOUT CONTRAST TECHNIQUE: Multidetector CT imaging of the abdomen and pelvis was performed following the standard protocol without IV contrast. COMPARISON:  CT dated June 18, 2004. FINDINGS: Lower chest: The lung bases are  clear. The heart size is normal. Hepatobiliary: The liver is normal. Normal gallbladder.There is no biliary ductal dilation. Pancreas: Normal contours without ductal dilatation. No peripancreatic fluid collection. Spleen: Unremarkable. Adrenals/Urinary Tract: --Adrenal glands: Unremarkable. --Right kidney/ureter: No hydronephrosis or radiopaque kidney stones. --Left kidney/ureter: No hydronephrosis or radiopaque kidney stones. --Urinary bladder: There is bladder wall thickening. Gas is noted within the urinary bladder. Stomach/Bowel: --Stomach/Duodenum: No hiatal hernia or other gastric abnormality. Normal duodenal course and caliber. --Small bowel: Unremarkable. --Colon: There is scattered colonic diverticula with findings suspicious for early uncomplicated sigmoid diverticulitis with there is mild adjacent fat stranding and wall thickening of the sigmoid colon. --Appendix: Normal. Vascular/Lymphatic: Normal course and caliber of the major abdominal vessels. --there are mildly enlarged nonspecific retroperitoneal lymph nodes. --No mesenteric lymphadenopathy. --there are few mildly enlarged inguinal and pelvic lymph nodes bilaterally. Reproductive: Unremarkable Other: No ascites or free air. There is nonspecific subcutaneous fat stranding involving the patient's low anterior abdominal wall. Musculoskeletal. No acute displaced fractures. IMPRESSION: 1. Bladder wall thickening with intraluminal gas. Findings may be secondary to recent instrumentation. Correlation with urinalysis is recommended to help exclude an underlying cystitis. 2. Findings suspicious for early uncomplicated sigmoid diverticulitis in the appropriate clinical setting. 3. No radiopaque kidney stones. No hydronephrosis. 4. Nonspecific subcutaneous fat stranding involving the patient's low anterior abdominal wall. Correlation with physical exam is recommended. 5. Mildly enlarged nonspecific retroperitoneal and pelvic lymph nodes, likely reactive.  Electronically Signed   By: Katherine Mantle M.D.   On: 05/04/2020 16:14   US SCROTUM W/DOPPLER  Result Date: 05/04/2020 CLINICAL DATA:  Bilateral testicular pain and right scrotal swelling for 2 days. Hematuria and dysuria EXAM: SCROTAL ULTRASOUND DOPPLER ULTRASOUND OF THE TESTICLES TECHNIQUE: Complete ultrasound examination of the testicles, epididymis, and other scrotal structures was performed. Color and spectral Doppler ultrasound were also utilized to evaluate blood flow to the testicles. COMPARISON:  None. FINDINGS: Right testicle Measurements: 3.4  x 2.0 x 2.0 cm. No mass or microlithiasis visualized. Left testicle Measurements: 3.7 x 1.8 x 2.3 cm. No mass or microlithiasis visualized. Right epididymis:  Normal in size and appearance. Left epididymis:  6 mm spermatocele noted in left epididymis. Hydrocele: A large right-sided hydrocele is seen. A scrotolith is also seen in the dependent portion of the hydrocele which measures 5 mm. Varicocele:  A small left-sided varicocele is seen. Pulsed Doppler interrogation of both testes demonstrates normal low resistance arterial and venous waveforms bilaterally. IMPRESSION: No evidence of testicular mass or torsion. Large right hydrocele. Small left varicocele. Electronically Signed   By: Danae Orleans M.D.   On: 05/04/2020 16:18    Procedures Procedures (including critical care time)  Medications Ordered in ED Medications  metroNIDAZOLE (FLAGYL) tablet 500 mg (has no administration in time range)  sodium chloride 0.9 % bolus 1,000 mL (1,000 mLs Intravenous New Bag/Given 05/04/20 1527)  ondansetron (ZOFRAN) injection 4 mg (4 mg Intravenous Given 05/04/20 1528)  fentaNYL (SUBLIMAZE) injection 50 mcg (50 mcg Intravenous Given 05/04/20 1528)  cefTRIAXone (ROCEPHIN) 2 g in sodium chloride 0.9 % 100 mL IVPB (0 g Intravenous Stopped 05/04/20 1716)  HYDROmorphone (DILAUDID) injection 1 mg (1 mg Intravenous Given 05/04/20 1643)  ondansetron (ZOFRAN) injection 4 mg (4  mg Intravenous Given 05/04/20 1642)    ED Course  I have reviewed the triage vital signs and the nursing notes.  Pertinent labs & imaging results that were available during my care of the patient were reviewed by me and considered in my medical decision making (see chart for details).  39 year old male presents for evaluation of suprapubic pain.  He is afebrile, nonseptic, not ill-appearing.  History of recurrent UTIs.  Has right flank pain.  History of stones.  Seen by urgent care earlier today noticed to have gross hematuria as well as infected urine.  Sent here to the emergency department as they were concerned the patient had some testicular pain on exam.  Evidence of infectious process on GU exam.  Does have some mild tenderness to his bilateral scrotum however no large abscess.  Declines rectal exam however denies pain with bowel movements.  He has diffuse tenderness to his suprapubic region as well as positive CVA tap on the right.  Heart and lungs clear.  Plan on labs, imaging and reassess plan on additional urine to culture.  Labs and imaging personally viewed interpreted: CBC with leukocytosis at 13.3 with a left shift Metabolic panel with mild hyperglycemia to 126 however no additional electrolyte, renal abnormality Urinalysis positive for infection will culture Ultrasound negative for torsion, abscess or epididymitis.  Does have right hydrocele and left varicocele CT with findings for thickened bladder wall and extraluminal air correlate with urinalysis for UTI as well as findings with sigmoid diverticulitis.   Imaging consistent with emphysematous cystitis as well as diverticulitis on CT imaging.  Treat with antibiotics here in ED, pain control, fluids likely DC home with antibiotics with close outpatient urology follow-up.  Patient does not appear septic or systemically ill.  Patient reassessed.  Pain down to 5/10.  No nausea.  Will give IV antibiotics, pain control and  reassess  Patient reassessed.  Pain controlled.  Tolerating p.o. intake.  He has received IV antibiotics in the emergency department.  Will DC home with antibiotics, analgesics, antiemetics.  I discussed return precautions with patient.  Is agreeable for close follow-up outpatient.  Patient does not meet the SIRS or Sepsis criteria.  On repeat exam patient  does not have a surgical abdomin and there are no peritoneal signs.  No indication of appendicitis, bowel obstruction, bowel perforation, cholecystitis, diverticulitis.    The patient has been appropriately medically screened and/or stabilized in the ED. I have low suspicion for any other emergent medical condition which would require further screening, evaluation or treatment in the ED or require inpatient management.  Patient is hemodynamically stable and in no acute distress.  Patient able to ambulate in department prior to ED.  Evaluation does not show acute pathology that would require ongoing or additional emergent interventions while in the emergency department or further inpatient treatment.  I have discussed the diagnosis with the patient and answered all questions.  Pain is been managed while in the emergency department and patient has no further complaints prior to discharge.  Patient is comfortable with plan discussed in room and is stable for discharge at this time.  I have discussed strict return precautions for returning to the emergency department.  Patient was encouraged to follow-up with PCP/specialist refer to at discharge.   MDM Rules/Calculators/A&P                       Final Clinical Impression(s) / ED Diagnoses Final diagnoses:  Emphysematous cystitis  Diverticulitis    Rx / DC Orders ED Discharge Orders         Ordered    ciprofloxacin (CIPRO) 500 MG tablet  Every 12 hours     05/04/20 1750    metroNIDAZOLE (FLAGYL) 500 MG tablet  2 times daily     05/04/20 1750    ondansetron (ZOFRAN ODT) 4 MG disintegrating  tablet  Every 8 hours PRN     05/04/20 1750    HYDROcodone-acetaminophen (NORCO/VICODIN) 5-325 MG tablet  Every 4 hours PRN     05/04/20 1750           Laurance Heide A, PA-C 05/04/20 1753    Charlesetta Shanks, MD 05/08/20 2254

## 2020-05-05 ENCOUNTER — Telehealth: Payer: Self-pay | Admitting: Family

## 2020-05-05 ENCOUNTER — Telehealth: Payer: Self-pay

## 2020-05-05 DIAGNOSIS — N3080 Other cystitis without hematuria: Secondary | ICD-10-CM

## 2020-05-05 NOTE — Telephone Encounter (Signed)
Patient called in to see if he could get a referral for a Urologist. Please follow up with the patient at [phone]

## 2020-05-05 NOTE — Telephone Encounter (Signed)
I have placed referral to [facility]. Jordan [name], can you please call over to them and see if they can get him in on 5/11?  Also, I think he needs a face to face visit in our office this week. I will not be available in the office. Can you try to get him in Thursday of Friday with another provider?

## 2020-05-06 ENCOUNTER — Encounter: Payer: Self-pay | Admitting: Family Medicine

## 2020-05-06 ENCOUNTER — Other Ambulatory Visit: Payer: Self-pay

## 2020-05-06 ENCOUNTER — Ambulatory Visit (INDEPENDENT_AMBULATORY_CARE_PROVIDER_SITE_OTHER): Payer: Medicare HMO | Admitting: Family Medicine

## 2020-05-06 VITALS — BP 128/80 | HR 92 | Temp 95.8°F | Ht 66.0 in | Wt 291.4 lb

## 2020-05-06 DIAGNOSIS — N3080 Other cystitis without hematuria: Secondary | ICD-10-CM

## 2020-05-06 LAB — URINE CULTURE: Culture: >=100000 — AB

## 2020-05-06 MED ORDER — URIBEL 118 MG PO CAPS: 1.0000 | ORAL_CAPSULE | Freq: Four times a day (QID) | ORAL | 0 refills | Status: DC

## 2020-05-06 MED ORDER — HYDROCODONE-ACETAMINOPHEN 5-325 MG PO TABS: 1.0000 | ORAL_TABLET | Freq: Four times a day (QID) | ORAL | 0 refills | Status: DC | PRN

## 2020-05-06 NOTE — Telephone Encounter (Signed)
Thank you :)

## 2020-05-06 NOTE — Patient Instructions (Addendum)
We put in an urgent referral for the urology team.  Do not drink alcohol, do any illicit/street drugs, drive or do anything that requires alertness while on these medicines. Consider a probiotic.    Continue the antibiotics.    Let us know if you need anything.

## 2020-05-06 NOTE — Progress Notes (Signed)
Chief Complaint  Patient presents with  . Hospitalization Follow-up    Subjective: Patient is a 39 y.o. male here for ED f/u.  Patient was seen on 5/9 in the emergency department and treated for diverticulitis and emphysematous cystitis.  He was placed on ciprofloxacin and metronidazole.  He started it yesterday morning.  He is tolerating the medicine well and reports compliance.  He is still having pain in the abdominal region and in his testicles.  He is having gas coming from his penis when he urinates.  At first he thought it was funny but then it started to cause some pain.  He has an appointment with the urology team in 6 days.  Hydrocodone has been helpful for the pain.  Past Medical History:  Diagnosis Date  . Anxiety and depression   . Chronic pain syndrome   . General medical examination   . HYPERTENSION, MILD   . OSTEOARTHRITIS     Objective: BP 128/80 (BP Location: Left Arm, Patient Position: Sitting, Cuff Size: Large)   Pulse 92   Temp (!) 95.8 F (35.4 C) (Temporal)   Ht 5\' 6"  (1.676 m)   Wt 291 lb 6 oz (132.2 kg)   SpO2 98%   BMI 47.03 kg/m  General: Awake, appears stated age Abdomen: Bowel sounds present, soft, tender to palpation diffusely worse in the lower quadrants, particularly the left lower quadrant; he is not distended, no masses or organomegaly Heart: RRR Lungs: CTAB, no rales, wheezes or rhonchi. No accessory muscle use Psych: Age appropriate judgment and insight, normal affect and mood  Assessment and Plan: Emphysematous cystitis - Plan: Ambulatory referral to Urology, Meth-Hyo-M Bl-Na Phos-Ph Sal (URIBEL) 118 MG CAPS, HYDROcodone-acetaminophen (NORCO/VICODIN) 5-325 MG tablet  Urgent referral to urology given his continued pain.  He may need more time on antibiotics, over the next 24 hours I expect more improvement.  Culture shows sensitivity to ciprofloxacin.  Continue Flagyl and ciprofloxacin.  Stay hydrated.  We will try to call in Uribel for the  urinary symptoms, hydrocodone as the former will not likely be covered. The patient voiced understanding and agreement to the plan.  [place], DO 05/06/20  3:11 PM

## 2020-05-06 NOTE — Telephone Encounter (Signed)
Per [facility] they have no availability until 5-17, patient on cancellation list. Was scheduled to come in and see Dr Coralee Rud today

## 2020-05-06 NOTE — Telephone Encounter (Signed)
Advised per Dr. Abner Greenspan start Doxycycline 100 bid. Take with food and use a probiotic while taking. Avoid mucinex DM.  Lvm for patient to call for this instructions. Rx sent to her pharmacy.

## 2020-05-07 ENCOUNTER — Telehealth: Payer: Self-pay

## 2020-05-07 DIAGNOSIS — G4733 Obstructive sleep apnea (adult) (pediatric): Secondary | ICD-10-CM | POA: Diagnosis not present

## 2020-05-07 LAB — URINE CULTURE: Culture: 80000 — AB

## 2020-05-07 NOTE — Telephone Encounter (Signed)
Post ED Visit - Positive Culture Follow-up  Culture report reviewed by antimicrobial stewardship pharmacist: [facility] []  , Pharm.D. []  Enzo Bi, Pharm.D., BCPS AQ-ID []  , Pharm.D., BCPS []  Celedonio Miyamoto, Pharm.D., BCPS []  [place], Garvin Fila.D., BCPS, AAHIVP []  , Pharm.D., BCPS, AAHIVP []  Georgina Pillion, PharmD, BCPS []  , PharmD, BCPS []  [place], PharmD, BCPS []  [address], PharmD []  , PharmD, BCPS []  Estella Husk, PharmD [facility] []  Lysle Pearl, PharmD []  , PharmD []  Phillips Climes, PharmD []  , Rph []  Agapito Games) , PharmD []  Verlan Friends, PharmD []  , PharmD []  Mervyn Gay, PharmD []  , PharmD []  Vinnie Level, PharmD []  Yolande Jolly, PharmD []  , PharmD []  Len Childs, PharmD   Positive urine culture Treated with Ciprofloxacin, organism sensitive to the same and no further patient follow-up is required at this time.  05/07/2020, 10:01 AM

## 2020-05-12 DIAGNOSIS — N430 Encysted hydrocele: Secondary | ICD-10-CM | POA: Diagnosis not present

## 2020-05-12 DIAGNOSIS — N3091 Cystitis, unspecified with hematuria: Secondary | ICD-10-CM | POA: Diagnosis not present

## 2020-06-07 DIAGNOSIS — G4733 Obstructive sleep apnea (adult) (pediatric): Secondary | ICD-10-CM | POA: Diagnosis not present

## 2020-06-23 DIAGNOSIS — H5213 Myopia, bilateral: Secondary | ICD-10-CM | POA: Diagnosis not present

## 2020-07-02 ENCOUNTER — Ambulatory Visit (INDEPENDENT_AMBULATORY_CARE_PROVIDER_SITE_OTHER): Payer: Medicare HMO | Admitting: Physician Assistant

## 2020-07-02 ENCOUNTER — Encounter: Payer: Self-pay | Admitting: Physician Assistant

## 2020-07-02 ENCOUNTER — Other Ambulatory Visit: Payer: Self-pay

## 2020-07-02 VITALS — BP 135/80 | HR 91 | Temp 98.1°F | Ht 66.0 in | Wt 287.5 lb

## 2020-07-02 DIAGNOSIS — R3989 Other symptoms and signs involving the genitourinary system: Secondary | ICD-10-CM

## 2020-07-02 DIAGNOSIS — J302 Other seasonal allergic rhinitis: Secondary | ICD-10-CM | POA: Diagnosis not present

## 2020-07-02 DIAGNOSIS — G4733 Obstructive sleep apnea (adult) (pediatric): Secondary | ICD-10-CM | POA: Diagnosis not present

## 2020-07-02 DIAGNOSIS — F419 Anxiety disorder, unspecified: Secondary | ICD-10-CM

## 2020-07-02 DIAGNOSIS — F329 Major depressive disorder, single episode, unspecified: Secondary | ICD-10-CM

## 2020-07-02 DIAGNOSIS — Z7689 Persons encountering health services in other specified circumstances: Secondary | ICD-10-CM

## 2020-07-02 DIAGNOSIS — I10 Essential (primary) hypertension: Secondary | ICD-10-CM | POA: Diagnosis not present

## 2020-07-02 DIAGNOSIS — N3080 Other cystitis without hematuria: Secondary | ICD-10-CM

## 2020-07-02 DIAGNOSIS — J4520 Mild intermittent asthma, uncomplicated: Secondary | ICD-10-CM | POA: Diagnosis not present

## 2020-07-02 MED ORDER — CETIRIZINE HCL 10 MG PO TABS: 10.0000 mg | ORAL_TABLET | Freq: Every day | ORAL | 0 refills | Status: DC

## 2020-07-02 MED ORDER — MONTELUKAST SODIUM 10 MG PO TABS: 10.0000 mg | ORAL_TABLET | Freq: Every day | ORAL | 0 refills | Status: DC

## 2020-07-02 NOTE — Progress Notes (Signed)
New Patient Office Visit  Subjective:  Patient ID: Jordan [name], male    DOB: 1981/12/22  Age: 39 y.o. MRN: [id number]  CC:  Chief Complaint  Patient presents with  . New Patient (Initial Visit)    HPI Jordan [name] presents to establish care and with concerns of continuing to have sound/gas with urination. Patient was referred by previous PCP for emphysematous cystitis to [facility], and patient discussed at length dissatisfaction with care received. Denies hematuria or pain with urination. Reports sometimes he has to strain with urinary stream.  Patient would like secondary evaluation by another urologist. Patient also has complaints of shortness of breath which is worse in the mornings.  He does have a history of asthma for which he uses albuterol. Reports at most he uses his albuterol inhaler twice per week, does not have to use it at night.  States he does seem to have seasonal and environmental allergies. Patient has a history of OSA and does not always wear his CPAP. Also reports controlling his anxiety and depression with meditation, which really seems to help.  Past Medical History:  Diagnosis Date  . Anxiety and depression   . Chronic pain syndrome   . General medical examination   . HYPERTENSION, MILD   . OSTEOARTHRITIS   . Sleep apnea     Past Surgical History:  Procedure Laterality Date  . KNEE SURGERY Right   . REPLACEMENT TOTAL KNEE Right     Family History  Problem Relation Age of Onset  . Heart disease Father   . Heart attack Father   . Diabetes Father   . High Cholesterol Father   . High blood pressure Father   . Heart disease Paternal Grandfather   . High Cholesterol Paternal Grandfather   . Heart disease Paternal Grandmother   . Heart attack Paternal Grandmother   . Diabetes Paternal Grandmother   . Heart disease Paternal Uncle   . Cancer Paternal Uncle   . Heart attack Paternal Uncle   . Diabetes Paternal  Uncle   . High Cholesterol Paternal Uncle   . Diabetes Paternal Aunt   . Hypertension Maternal Uncle   . Cancer Maternal Aunt        lung  . Cancer Maternal Uncle        lung  . Depression Mother     Social History   Socioeconomic History  . Marital status: Married    Spouse name: Designer, television/film set  . Number of children: 2  . Years of education: some college  . Highest education level: Not on file  Occupational History    Employer: UNEMPLOYED    Comment: Disabled  Tobacco Use  . Smoking status: Former Smoker    Years: 2.00    Types: Cigars    Quit date: 01/18/2014    Years since quitting: 6.5  . Smokeless tobacco: Never Used  Vaping Use  . Vaping Use: Never used  Substance and Sexual Activity  . Alcohol use: Yes    Alcohol/week: 7.0 standard drinks    Types: 7 Standard drinks or equivalent per week  . Drug use: No  . Sexual activity: Yes  Other Topics Concern  . Not on file  Social History Narrative   Caffeine Use:  6 beverages daily   Regular exercise:  2 x weekly         Social Determinants of Health   Financial Resource Strain:   . Difficulty of Paying Living Expenses:  Food Insecurity:   . Worried About Programme researcher, broadcasting/film/video in the Last Year:   . Barista in the Last Year:   Transportation Needs:   . Freight forwarder (Medical):   Marland Kitchen Lack of Transportation (Non-Medical):   Physical Activity:   . Days of Exercise per Week:   . Minutes of Exercise per Session:   Stress:   . Feeling of Stress :   Social Connections:   . Frequency of Communication with Friends and Family:   . Frequency of Social Gatherings with Friends and Family:   . Attends Religious Services:   . Active Member of Clubs or Organizations:   . Attends Banker Meetings:   Marland Kitchen Marital Status:   Intimate Partner Violence:   . Fear of Current or Ex-Partner:   . Emotionally Abused:   Marland Kitchen Physically Abused:   . Sexually Abused:     ROS Review of Systems  A  fourteen system review of systems was performed and found to be positive as per HPI.  Objective:   Today's Vitals: BP 135/80   Pulse 91   Temp 98.1 F (36.7 C) (Oral)   Ht 5\' 6"  (1.676 m)   Wt 287 lb 8 oz (130.4 kg)   SpO2 98%   BMI 46.40 kg/m   Physical Exam General:  Well Developed, well nourished, appropriate for stated age.  Neuro:  Alert and oriented,  extra-ocular muscles intact, no focal deficits  HEENT:  Normocephalic, atraumatic, neck supple  Skin:  no gross rash, warm, pink. Cardiac:  RRR, S1 S2 Respiratory:  ECTA B/L and A/P, Not using accessory muscles, speaking in full sentences- unlabored. Vascular:  Ext warm, no cyanosis apprec.; cap RF less 2 sec. Psych:  No HI/SI, judgement and insight good, Euthymic mood. Full Affect.  Assessment & Plan:   Problem List Items Addressed This Visit      Cardiovascular and Mediastinum   HYPERTENSION, MILD     Respiratory   OSA (obstructive sleep apnea)   Asthma   Relevant Medications   montelukast (SINGULAIR) 10 MG tablet     Other   Anxiety and depression   Morbid obesity (HCC)    Other Visit Diagnoses    Encounter to establish care    -  Primary   Seasonal allergies       Relevant Medications   cetirizine (ZYRTEC) 10 MG tablet   montelukast (SINGULAIR) 10 MG tablet   Pneumaturia       Relevant Orders   Ambulatory referral to Urology   Emphysematous cystitis       Relevant Orders   Ambulatory referral to Urology     Emphysematous cystitis: -Improving but patient continues to have concerns. -Placed urology referral for second opinion.  Seasonal allergies, Asthma: -Discussed with patient management for seasonal allergies and agreeable to starting Singulair and Zyrtec to help improve symptoms and shortness of breath. -Continue albuterol as needed. -Follow-up in 6 to 8 weeks to reassess symptoms and medication management.  Mild hypertension: -BP today is 145/79 HR 91, BP recheck 135/80-improved,  stable -Encourage ambulatory BP and pulse monitoring. PB goal is <135/85. -Follow DASH diet and stay as active as possible. -Will continue to monitor   Anxiety and depression: -PHQ-9 score of 7 a GAD score of 7 -Discussed with patient various management options and prefers to continue nonpharmacological therapy. -Will continue to monitor.  OSA: -Followed by pulmonology. -Recommend to use CPAP machine as instructed.  Obesity: -Recommend to  follow heart healthy/Mediterranean diet and increase physical activity such as water aerobics or light exercise.  -Patient reports chronic arthritis, especially bilateral knees limit his physical activity.  Outpatient Encounter Medications as of 07/02/2020  Medication Sig  . albuterol (VENTOLIN HFA) 108 (90 Base) MCG/ACT inhaler Inhale 2 puffs into the lungs every 6 (six) hours as needed for wheezing or shortness of breath.  Marland Kitchen L-Methylfolate-Algae-B12-B6 (METANX) 3-90.314-2-35 MG CAPS TAKE 1 CAPSULE BY MOUTH 2 TIMES DAILY.  Marland Kitchen aspirin 81 MG tablet Take 81 mg by mouth daily. (Patient not taking: Reported on 07/02/2020)  . cetirizine (ZYRTEC) 10 MG tablet Take 1 tablet (10 mg total) by mouth daily.  . montelukast (SINGULAIR) 10 MG tablet Take 1 tablet (10 mg total) by mouth at bedtime.  . [DISCONTINUED] cetirizine (ZYRTEC) 10 MG tablet Take 1 tablet (10 mg total) by mouth daily. (Patient not taking: Reported on 07/02/2020)  . [DISCONTINUED] diclofenac sodium (VOLTAREN) 1 % GEL Apply 2 g topically 4 (four) times daily. (Patient not taking: Reported on 07/02/2020)  . [DISCONTINUED] HYDROcodone-acetaminophen (NORCO/VICODIN) 5-325 MG tablet Take 1 tablet by mouth every 6 (six) hours as needed for moderate pain. (Patient not taking: Reported on 07/02/2020)  . [DISCONTINUED] Meth-Hyo-M Bl-Na Phos-Ph Sal (URIBEL) 118 MG CAPS Take 1 capsule (118 mg total) by mouth in the morning, at noon, in the evening, and at bedtime. (Patient not taking: Reported on 07/02/2020)  .  [DISCONTINUED] metroNIDAZOLE (FLAGYL) 500 MG tablet Take 1 tablet (500 mg total) by mouth 2 (two) times daily. (Patient not taking: Reported on 07/02/2020)  . [DISCONTINUED] ondansetron (ZOFRAN ODT) 4 MG disintegrating tablet Take 1 tablet (4 mg total) by mouth every 8 (eight) hours as needed for nausea or vomiting. (Patient not taking: Reported on 07/02/2020)   No facility-administered encounter medications on file as of 07/02/2020.     Follow-up: Return for 6-8 weeks for allergies/asthma, htn .   Note:  This note was prepared with assistance of Dragon voice recognition software. Occasional wrong-word or sound-a-like substitutions may have occurred due to the inherent limitations of voice recognition software.   Mayer Masker, PA-C

## 2020-07-07 DIAGNOSIS — G4733 Obstructive sleep apnea (adult) (pediatric): Secondary | ICD-10-CM | POA: Diagnosis not present

## 2020-08-07 DIAGNOSIS — G4733 Obstructive sleep apnea (adult) (pediatric): Secondary | ICD-10-CM | POA: Diagnosis not present

## 2020-08-12 NOTE — Progress Notes (Signed)
08/13/2020 2:28 PM   Randel Pigg Joines [name] 12-Mar-1981 [id number]  Referring provider: Mayer Masker, PA-C [address],  [place] [address] Chief Complaint  Patient presents with  . Other    HPI: Jordan [name] is a 39 y.o. male who presents today at the request of Mayer Masker, PA-C for evaluation and management of pneumaturia.   -Previous PCP referred him to [facility] for emphysematous cystitis.  -The patient had an initial visit with Dr. Jiles Harold on 07/02/2020. -Recurrent hematuria -Frequency, urgency, dysuria -Has noted particulate debris in urine -Noted hematuria after a bout of diverticulitis -CT without contrast May 2021 remarkable for bladder wall thickening with gas in bladder  PMH: Past Medical History:  Diagnosis Date  . Anxiety and depression   . Chronic pain syndrome   . General medical examination   . HYPERTENSION, MILD   . OSTEOARTHRITIS   . Sleep apnea     Surgical History: Past Surgical History:  Procedure Laterality Date  . KNEE SURGERY Right   . REPLACEMENT TOTAL KNEE Right     Home Medications:  Allergies as of 08/13/2020      Reactions   Butrans [buprenorphine Hcl]    Difficulty breathing, n/v   Nucynta [tapentadol Hydrochloride] Anaphylaxis   Tapentadol Hcl Anaphylaxis   Glucose Polymer Other (See Comments)   poison   Sumac    poison      Medication List       Accurate as of August 13, 2020  2:28 PM. If you have any questions, ask your nurse or doctor.        albuterol 108 (90 Base) MCG/ACT inhaler Commonly known as: VENTOLIN HFA Inhale 2 puffs into the lungs every 6 (six) hours as needed for wheezing or shortness of breath.   aspirin 81 MG tablet Take 81 mg by mouth daily.   cetirizine 10 MG tablet Commonly known as: ZYRTEC Take 1 tablet (10 mg total) by mouth daily.   Metanx 3-90.314-2-35 MG Caps TAKE 1 CAPSULE BY MOUTH 2 TIMES DAILY.   montelukast 10 MG  tablet Commonly known as: SINGULAIR Take 1 tablet (10 mg total) by mouth at bedtime.       Allergies:  Allergies  Allergen Reactions  . Butrans [Buprenorphine Hcl]     Difficulty breathing, n/v  . Nucynta [Tapentadol Hydrochloride] Anaphylaxis  . Tapentadol Hcl Anaphylaxis  . Glucose Polymer Other (See Comments)    poison  . Sumac     poison    Family History: Family History  Problem Relation Age of Onset  . Heart disease Father   . Heart attack Father   . Diabetes Father   . High Cholesterol Father   . High blood pressure Father   . Heart disease Paternal Grandfather   . High Cholesterol Paternal Grandfather   . Heart disease Paternal Grandmother   . Heart attack Paternal Grandmother   . Diabetes Paternal Grandmother   . Heart disease Paternal Uncle   . Cancer Paternal Uncle   . Heart attack Paternal Uncle   . Diabetes Paternal Uncle   . High Cholesterol Paternal Uncle   . Diabetes Paternal Aunt   . Hypertension Maternal Uncle   . Cancer Maternal Aunt        lung  . Cancer Maternal Uncle        lung  . Depression Mother     Social History:  reports that he quit smoking about 6 years ago. His smoking  use included cigars. He quit after 2.00 years of use. He has never used smokeless tobacco. He reports current alcohol use of about 7.0 standard drinks of alcohol per week. He reports that he does not use drugs.   Physical Exam: BP 133/85   Pulse 80   Ht 5\' 6"  (1.676 m)   Wt 290 lb (131.5 kg)   BMI 46.81 kg/m   Constitutional:  Alert and oriented, No acute distress. HEENT: [place] AT, moist mucus membranes.  Trachea midline, no masses. Cardiovascular: No clubbing, cyanosis, or edema. Respiratory: Normal respiratory effort, no increased work of breathing. Skin: No rashes, bruises or suspicious lesions. Neurologic: Grossly intact, no focal deficits, moving all 4 extremities. Psychiatric: Normal mood and affect.  Laboratory Data:  Urinalysis Dipstick 1+ blood/1+  leukocyte Microscopy 11-30 WBCs  Pertinent Imaging:  CT Renal Stone Study  Narrative CLINICAL DATA:  Hematuria x1 day. Back pain. History of renal stones.  EXAM: CT ABDOMEN AND PELVIS WITHOUT CONTRAST  TECHNIQUE: Multidetector CT imaging of the abdomen and pelvis was performed following the standard protocol without IV contrast.  COMPARISON:  CT dated June 18, 2004.  FINDINGS: Lower chest: The lung bases are clear. The heart size is normal.  Hepatobiliary: The liver is normal. Normal gallbladder.There is no biliary ductal dilation.  Pancreas: Normal contours without ductal dilatation. No peripancreatic fluid collection.  Spleen: Unremarkable.  Adrenals/Urinary Tract:  --Adrenal glands: Unremarkable.  --Right kidney/ureter: No hydronephrosis or radiopaque kidney stones.  --Left kidney/ureter: No hydronephrosis or radiopaque kidney stones.  --Urinary bladder: There is bladder wall thickening. Gas is noted within the urinary bladder.  Stomach/Bowel:  --Stomach/Duodenum: No hiatal hernia or other gastric abnormality. Normal duodenal course and caliber.  --Small bowel: Unremarkable.  --Colon: There is scattered colonic diverticula with findings suspicious for early uncomplicated sigmoid diverticulitis with there is mild adjacent fat stranding and wall thickening of the sigmoid colon.  --Appendix: Normal.  Vascular/Lymphatic: Normal course and caliber of the major abdominal vessels.  --there are mildly enlarged nonspecific retroperitoneal lymph nodes.  --No mesenteric lymphadenopathy.  --there are few mildly enlarged inguinal and pelvic lymph nodes bilaterally.  Reproductive: Unremarkable  Other: No ascites or free air. There is nonspecific subcutaneous fat stranding involving the patient's low anterior abdominal wall.  Musculoskeletal. No acute displaced fractures.  IMPRESSION: 1. Bladder wall thickening with intraluminal gas. Findings may  be secondary to recent instrumentation. Correlation with urinalysis is recommended to help exclude an underlying cystitis. 2. Findings suspicious for early uncomplicated sigmoid diverticulitis in the appropriate clinical setting. 3. No radiopaque kidney stones. No hydronephrosis. 4. Nonspecific subcutaneous fat stranding involving the patient's low anterior abdominal wall. Correlation with physical exam is recommended. 5. Mildly enlarged nonspecific retroperitoneal and pelvic lymph nodes, likely reactive.   Electronically Signed By: June 20, 2004 M.D. On: 05/04/2020 16:14   I have personally reviewed the images and agree with radiologist interpretation.    Assessment & Plan:    1. Pneumaturia  Patient has a history of diverticulitis.  We discussed the possibility of a colovesical fistula.  Will schedule a cystoscopy for further investigation.  -Valium Rx sent preprocedure and he was informed he will need a driver  2.  Pyuria Urine culture ordered  [facility] [address] [place], [place] [id number]  I, [phone], am acting as a scribe for Dr. Theador Hawthorne C. Nikolaj Geraghty,  I have reviewed the above documentation for accuracy and completeness, and I agree with the above.   Maelie Chriswell  Mariana Arn, MD

## 2020-08-13 ENCOUNTER — Ambulatory Visit: Payer: Medicare HMO | Admitting: Urology

## 2020-08-13 ENCOUNTER — Encounter: Payer: Self-pay | Admitting: Urology

## 2020-08-13 VITALS — BP 133/85 | HR 80 | Ht 66.0 in | Wt 290.0 lb

## 2020-08-13 DIAGNOSIS — R3989 Other symptoms and signs involving the genitourinary system: Secondary | ICD-10-CM | POA: Diagnosis not present

## 2020-08-13 DIAGNOSIS — N390 Urinary tract infection, site not specified: Secondary | ICD-10-CM | POA: Diagnosis not present

## 2020-08-13 LAB — URINALYSIS, COMPLETE
Bilirubin, UA: NEGATIVE
Glucose, UA: NEGATIVE
Ketones, UA: NEGATIVE
Nitrite, UA: NEGATIVE
Protein,UA: NEGATIVE
Specific Gravity, UA: 1.01 (ref 1.005–1.030)
Urobilinogen, Ur: 0.2 mg/dL (ref 0.2–1.0)
pH, UA: 6.5 (ref 5.0–7.5)

## 2020-08-13 LAB — MICROSCOPIC EXAMINATION

## 2020-08-13 MED ORDER — DIAZEPAM 10 MG PO TABS: ORAL_TABLET | ORAL | 0 refills | Status: DC

## 2020-08-17 ENCOUNTER — Other Ambulatory Visit: Payer: Self-pay | Admitting: Urology

## 2020-08-17 ENCOUNTER — Encounter: Payer: Self-pay | Admitting: Urology

## 2020-08-19 ENCOUNTER — Other Ambulatory Visit: Payer: Self-pay | Admitting: Urology

## 2020-08-19 ENCOUNTER — Telehealth: Payer: Self-pay | Admitting: *Deleted

## 2020-08-19 LAB — CULTURE, URINE COMPREHENSIVE

## 2020-08-19 MED ORDER — SULFAMETHOXAZOLE-TRIMETHOPRIM 800-160 MG PO TABS: 1.0000 | ORAL_TABLET | Freq: Two times a day (BID) | ORAL | 0 refills | Status: AC

## 2020-08-19 NOTE — Telephone Encounter (Signed)
Notified patient wife as instructed, patient pleased. Discussed follow-up appointments, patient agrees  

## 2020-08-19 NOTE — Telephone Encounter (Signed)
-----   Message from Riki Altes, MD sent at 08/19/2020  3:56 PM EDT ----- Urine culture was positive for infection.  I sent in an antibiotic

## 2020-08-20 ENCOUNTER — Other Ambulatory Visit: Payer: Self-pay | Admitting: Urology

## 2020-08-27 ENCOUNTER — Ambulatory Visit (INDEPENDENT_AMBULATORY_CARE_PROVIDER_SITE_OTHER): Payer: Medicare HMO | Admitting: Physician Assistant

## 2020-08-27 ENCOUNTER — Encounter: Payer: Self-pay | Admitting: Physician Assistant

## 2020-08-27 ENCOUNTER — Other Ambulatory Visit: Payer: Self-pay

## 2020-08-27 ENCOUNTER — Encounter: Payer: Self-pay | Admitting: Urology

## 2020-08-27 ENCOUNTER — Ambulatory Visit: Payer: Medicare HMO | Admitting: Urology

## 2020-08-27 VITALS — BP 168/90 | Ht 66.0 in | Wt 290.0 lb

## 2020-08-27 VITALS — BP 160/95 | HR 97 | Ht 66.0 in | Wt 290.0 lb

## 2020-08-27 DIAGNOSIS — J45909 Unspecified asthma, uncomplicated: Secondary | ICD-10-CM

## 2020-08-27 DIAGNOSIS — I10 Essential (primary) hypertension: Secondary | ICD-10-CM

## 2020-08-27 DIAGNOSIS — N321 Vesicointestinal fistula: Secondary | ICD-10-CM

## 2020-08-27 DIAGNOSIS — N390 Urinary tract infection, site not specified: Secondary | ICD-10-CM

## 2020-08-27 DIAGNOSIS — J302 Other seasonal allergic rhinitis: Secondary | ICD-10-CM

## 2020-08-27 NOTE — Progress Notes (Signed)
   08/27/20  CC:  Chief Complaint  Patient presents with  . Cysto   Indications: Pneumaturia   HPI: Refer to my prior office note 08/13/2020.  Urine culture grew Klebsiella oxytocin.  Completes antibiotic course in 3 days  Blood pressure (!) 160/95, pulse 97, height 5\' 6"  (1.676 m), weight 290 lb (131.5 kg). NED. A&Ox3.   No respiratory distress   Abd soft, NT, ND Normal phallus with bilateral descended testicles  Cystoscopy Procedure Note  Patient identification was confirmed, informed consent was obtained, and patient was prepped using Betadine solution.  Lidocaine jelly was administered per urethral meatus.     Pre-Procedure: - Inspection reveals a normal caliber urethral meatus.  Procedure: The flexible cystoscope was introduced without difficulty - No urethral strictures/lesions are present. - Mild lateral lobe enlargement prostate  - Normal bladder neck - Bilateral ureteral orifices identified - Bladder mucosa no solid or papillary lesions.  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

## 2020-08-27 NOTE — Progress Notes (Signed)
Telehealth office visit note for Jordan Masker, PA-C- at Primary Care at [facility]   I connected with current patient today by telephone and verified that I am speaking with the correct person   . Location of the patient: Home . Location of the provider: Office - This visit type was conducted due to national recommendations for restrictions regarding the COVID-19 Pandemic (e.g. social distancing) in an effort to limit this patient's exposure and mitigate transmission in our community.    - No physical exam could be performed with this format, beyond that communicated to [place] by the patient/ family members as noted.   - Additionally my office staff/ schedulers were to discuss with the patient that there may be a monetary charge related to this service, depending on their medical insurance.  My understanding is that patient understood and consented to proceed.     _________________________________________________________________________________   History of Present Illness: Patient calls in to follow-up on his her allergies and asthma.  Patient reports she has noticed an improvement with Zyrtec but feels like Singulair is not helping much.  When he takes the Singulair he feels weird.  Also reports feeling dizzy and gets a headache with medication. States he uses albuterol maybe once a day and if he has been outside in the humidity will use it again before bedtime.  Reports he had a bladder procedure today and urologist recommended referral to general surgery, which he prefers to be in [place].  Reports he is in significant pain and was anxious before the procedure making his blood pressure to be high.  He has been checking his blood pressure at home regularly and usually ranges 130/80-90, states his blood pressure is elevated when he is in pain or has had more sodium than usual. Denies cardiac symptoms.     GAD 7 : Generalized Anxiety Score 07/02/2020  Nervous, Anxious, on Edge 1   Control/stop worrying 1  Worry too much - different things 1  Trouble relaxing 1  Restless 0  Easily annoyed or irritable 1  Afraid - awful might happen 2  Total GAD 7 Score 7  Anxiety Difficulty Very difficult    Depression screen [facility] 2/9 08/27/2020 07/02/2020  Decreased Interest 0 1  Down, Depressed, Hopeless 0 1  PHQ - 2 Score 0 2  Altered sleeping 3 3  Tired, decreased energy 1 1  Change in appetite 0 0  Feeling bad or failure about yourself  0 0  Trouble concentrating 0 1  Moving slowly or fidgety/restless 0 0  Suicidal thoughts 0 0  PHQ-9 Score 4 7  Difficult doing work/chores Somewhat difficult Very difficult      Impression and Recommendations:     1. Seasonal allergies   2. Moderate asthma without complication, unspecified whether persistent   3. Colovesical fistula   4. HYPERTENSION, MILD     Hypertension: -BP elevated and most likely due to anxiety related to procedure and pain. Advised patient to continue with ambulatory BP and pulse monitoring, and if BP consistently above 135/85 should notify the clinic and will start antihypertensive medication. Previous BP in office stable. -Recommend to follow low sodium diet. -Will continue to monitor.   Seasonal allergies, Moderate asthma without complication: -Patient unable to tolerate Singulair so will discontinue. Allergy symptoms have improved so will continue Zyrtec to help reduce asthma contributors.  -If continues to use albuterol frequently then recommend spirometry testing/peak flow to evaluate how well asthma is controlled and add  on additional inhaler/s (ICS-LABA) if indicated.  Colovesical fistula:  -Placed referral to general surgery.     - As part of my medical decision making, I reviewed the following data within the electronic MEDICAL RECORD NUMBER History obtained from pt /family, CMA notes reviewed and incorporated if applicable, Labs reviewed, Radiograph/ tests reviewed if applicable and OV notes from  prior OV's with me, as well as any other specialists she/he has seen since seeing me last, were all reviewed and used in my medical decision making process today.    - Additionally, when appropriate, discussion had with patient regarding our treatment plan, and their biases/concerns about that plan were used in my medical decision making today.    - The patient agreed with the plan and demonstrated an understanding of the instructions.   No barriers to understanding were identified.     - The patient was advised to call back or seek an in-person evaluation if the symptoms worsen or if the condition fails to improve as anticipated.   Return in about 4 months (around 12/27/2020) for CPE and FBW.    Orders Placed This Encounter  Procedures  . Ambulatory referral to General Surgery    No orders of the defined types were placed in this encounter.   There are no discontinued medications.     Time spent on visit including pre-visit chart review and post-visit care was 15 minutes.      The 21st Century Cures Act was signed into law in 2016 which includes the topic of electronic health records.  This provides immediate access to information in MyChart.  This includes consultation notes, operative notes, office notes, lab results and pathology reports.  If you have any questions about what you read please let us know at your next visit or call us at the office.  We are right here with you.  Note:  This note was prepared with assistance of Dragon voice recognition software. Occasional wrong-word or sound-a-like substitutions may have occurred due to the inherent limitations of voice recognition software.  __________________________________________________________________________________     Patient Care Team    Relationship Specialty Notifications Start End  Jordan [name], [place] PCP - General Physician Assistant  07/02/20   Currie Paris, MD Referring Physician Orthopedic  Surgery  09/16/15   Oretha Milch, MD Consulting Physician Pulmonary Disease  09/16/15      -Vitals obtained; medications/ allergies reconciled;  personal medical, social, Sx etc.histories were updated by CMA, reviewed by me and are reflected in chart   Patient Active Problem List   Diagnosis Date Noted  . History of diverticulitis of colon 09/09/2020  . Colovesical fistula 09/09/2020  . Asthma 10/01/2016  . Morbid obesity (HCC) 02/06/2015  . OSA (obstructive sleep apnea) 10/24/2014  . Anxiety and depression 01/21/2010  . CHRONIC PAIN SYNDROME 01/21/2010  . HYPERTENSION, MILD 01/21/2010  . Osteoarthritis 01/21/2010     Current Meds  Medication Sig  . albuterol (VENTOLIN HFA) 108 (90 Base) MCG/ACT inhaler Inhale 2 puffs into the lungs every 6 (six) hours as needed for wheezing or shortness of breath.  Marland Kitchen aspirin 81 MG tablet Take 81 mg by mouth daily.   . cetirizine (ZYRTEC) 10 MG tablet Take 1 tablet (10 mg total) by mouth daily.  . [EXPIRED] sulfamethoxazole-trimethoprim (BACTRIM DS) 800-160 MG tablet Take 1 tablet by mouth 2 (two) times daily for 10 days.  . [DISCONTINUED] montelukast (SINGULAIR) 10 MG tablet Take 1 tablet (10 mg total) by mouth at bedtime.  Allergies:  Allergies  Allergen Reactions  . Butrans [Buprenorphine Hcl]     Difficulty breathing, n/v  . Nucynta [Tapentadol Hydrochloride] Anaphylaxis  . Tapentadol Hcl Anaphylaxis  . Glucose Polymer Other (See Comments)    poison  . Sumac     poison  . Bactrim [Sulfamethoxazole-Trimethoprim] Itching and Rash     ROS:  See above HPI for pertinent positives and negatives   Objective:   Blood pressure (!) 168/90, height 5\' 6"  (1.676 m), weight 290 lb (131.5 kg).  (if some vitals are omitted, this means that patient was UNABLE to obtain them even though they were asked to get them prior to OV today.  They were asked to call at their earliest convenience with these once obtained. ) General: A & O * 3;  sounds in no acute distress; in usual state of health.  Respiratory: speaking in full sentences, no conversational dyspnea;  Psych: insight appears good, mood- appears full

## 2020-08-28 LAB — URINALYSIS, COMPLETE
Bilirubin, UA: NEGATIVE
Glucose, UA: NEGATIVE
Ketones, UA: NEGATIVE
Nitrite, UA: NEGATIVE
Protein,UA: NEGATIVE
Specific Gravity, UA: 1.015 (ref 1.005–1.030)
Urobilinogen, Ur: 0.2 mg/dL (ref 0.2–1.0)
pH, UA: 7 (ref 5.0–7.5)

## 2020-08-28 LAB — MICROSCOPIC EXAMINATION
Bacteria, UA: NONE SEEN
WBC, UA: >30 /hpf — AB (ref 0–5)

## 2020-09-02 ENCOUNTER — Telehealth: Payer: Self-pay | Admitting: Physician Assistant

## 2020-09-02 NOTE — Telephone Encounter (Signed)
Patient's wife Efraim Kaufmann (DPR on file) left VM asking to peak with nurse about her husband, she did not specify reason for call other than she wants to speak with clinic staff about a concern she has.

## 2020-09-02 NOTE — Telephone Encounter (Signed)
Patient is aware apt with General Surgery is 09/09/20 at 9am and given phone number.   [name] states patient is urinating blood. I advised that we do not have a provider in our office until Thursday and for them to call Urologist that patient is established with for advise. [name] verbalized understanding. AS< CMA

## 2020-09-03 ENCOUNTER — Telehealth: Payer: Self-pay | Admitting: Urology

## 2020-09-03 MED ORDER — SULFAMETHOXAZOLE-TRIMETHOPRIM 800-160 MG PO TABS: 1.0000 | ORAL_TABLET | Freq: Two times a day (BID) | ORAL | 0 refills | Status: DC

## 2020-09-03 NOTE — Telephone Encounter (Signed)
Patient called the after hours line stated he was bleeding after his cysto and needed an ABX I have dropped the call report in media  Thanks [place]

## 2020-09-03 NOTE — Telephone Encounter (Signed)
With diagnosis of colovesical fistula antibiotic was sent

## 2020-09-06 ENCOUNTER — Telehealth: Payer: Self-pay | Admitting: Urology

## 2020-09-06 NOTE — Telephone Encounter (Signed)
I was contacted by the call service RN regarding the patient.  30 minutes after taking bactrim he had blood streaked vomit and developed a fever to 102.  I have recommended that he be seen at the [facility] ER for further evaluation.   He has a colovesical fistula and had klebsiella on culture.   I don't feel I can safely say the symptoms are just from a drug reaction and feel he needs evaluation prior to getting a different med.

## 2020-09-09 ENCOUNTER — Ambulatory Visit (INDEPENDENT_AMBULATORY_CARE_PROVIDER_SITE_OTHER): Payer: Medicare HMO | Admitting: Gastroenterology

## 2020-09-09 ENCOUNTER — Other Ambulatory Visit: Payer: Self-pay

## 2020-09-09 ENCOUNTER — Ambulatory Visit: Payer: Self-pay | Admitting: Surgery

## 2020-09-09 ENCOUNTER — Telehealth: Payer: Self-pay

## 2020-09-09 ENCOUNTER — Encounter: Payer: Self-pay | Admitting: Surgery

## 2020-09-09 ENCOUNTER — Ambulatory Visit (INDEPENDENT_AMBULATORY_CARE_PROVIDER_SITE_OTHER): Payer: Medicare HMO | Admitting: Surgery

## 2020-09-09 ENCOUNTER — Encounter: Payer: Self-pay | Admitting: Gastroenterology

## 2020-09-09 VITALS — BP 129/83 | HR 94 | Temp 98.5°F | Ht 66.0 in | Wt 286.6 lb

## 2020-09-09 VITALS — BP 138/93 | HR 83 | Temp 98.3°F | Ht 66.0 in | Wt 286.0 lb

## 2020-09-09 DIAGNOSIS — Z8719 Personal history of other diseases of the digestive system: Secondary | ICD-10-CM | POA: Diagnosis not present

## 2020-09-09 DIAGNOSIS — N321 Vesicointestinal fistula: Secondary | ICD-10-CM

## 2020-09-09 MED ORDER — NA SULFATE-K SULFATE-MG SULF 17.5-3.13-1.6 GM/177ML PO SOLN: 1.0000 | Freq: Once | ORAL | 0 refills | Status: AC

## 2020-09-09 NOTE — H&P (View-Only) (Signed)
Patient ID: Jordan [name], male   DOB: 07/02/1981, 39 y.o.   MRN: [id number]  Chief Complaint: Colovesical fistula  History of Present Illness Jordan [name] is a 39 y.o. male with seemingly simultaneous presentation of his first bout with diverticulitis with bladder pain and urinary tract infection.  He admits to a somewhat significant history of constipation in the past, but no prior history of diverticulitis/no complications prior to his presentation with associated year urinary tract infection.  No history of blood per rectum, unintentional weight loss, or family history of colon cancer.  He has had no prior endoscopy, from GI.  Was evaluated weighted by his urologist, Dr. Lonna Cobb,  and a site consistent with a colovesical fistula was identified on cystoscopy.  In hindsight, the CT may also suggest this. So over the last 4 months he has been on 3 courses of antibiotics, as found that he has allergic to sulfa.  Past Medical History Past Medical History:  Diagnosis Date  . Anxiety and depression   . Chronic pain syndrome   . HYPERTENSION, MILD   . OSTEOARTHRITIS   . Recurrent UTI   . Sleep apnea       Past Surgical History:  Procedure Laterality Date  . KNEE SURGERY Right    7 total surgeries  . REPLACEMENT TOTAL KNEE Right     Allergies  Allergen Reactions  . Butrans [Buprenorphine Hcl]     Difficulty breathing, n/v  . Nucynta [Tapentadol Hydrochloride] Anaphylaxis  . Tapentadol Hcl Anaphylaxis  . Glucose Polymer Other (See Comments)    poison  . Sumac     poison  . Bactrim [Sulfamethoxazole-Trimethoprim] Itching and Rash    Current Outpatient Medications  Medication Sig Dispense Refill  . albuterol (VENTOLIN HFA) 108 (90 Base) MCG/ACT inhaler Inhale 2 puffs into the lungs every 6 (six) hours as needed for wheezing or shortness of breath. 18 g 2  . aspirin 81 MG tablet Take 81 mg by mouth daily.     . cetirizine (ZYRTEC) 10 MG tablet Take  1 tablet (10 mg total) by mouth daily. 90 tablet 0   No current facility-administered medications for this visit.    Family History Family History  Problem Relation Age of Onset  . Heart disease Father   . Heart attack Father   . Diabetes Father   . High Cholesterol Father   . High blood pressure Father   . Heart disease Paternal Grandfather   . High Cholesterol Paternal Grandfather   . Heart disease Paternal Grandmother   . Heart attack Paternal Grandmother   . Diabetes Paternal Grandmother   . Heart disease Paternal Uncle   . Cancer Paternal Uncle   . Heart attack Paternal Uncle   . Diabetes Paternal Uncle   . High Cholesterol Paternal Uncle   . Diabetes Paternal Aunt   . Hypertension Maternal Uncle   . Cancer Maternal Aunt        lung  . Cancer Maternal Uncle        lung  . Depression Mother   . Dementia Mother       Social History Social History   Tobacco Use  . Smoking status: Former Smoker    Years: 2.00    Types: Cigars    Quit date: 01/18/2014    Years since quitting: 6.6  . Smokeless tobacco: Never Used  Vaping Use  . Vaping Use: Never used  Substance Use Topics  . Alcohol use: Yes  Alcohol/week: 7.0 standard drinks    Types: 7 Standard drinks or equivalent per week  . Drug use: No        Review of Systems  Constitutional: Negative for chills and fever.  HENT: Negative.   Eyes: Negative.   Respiratory: Negative for cough.   Cardiovascular: Negative for chest pain.  Gastrointestinal: Positive for heartburn. Negative for abdominal pain, blood in stool and melena.  Genitourinary: Positive for dysuria (Pneumaturia).  Skin: Negative for itching and rash.  Neurological: Negative.   Psychiatric/Behavioral: Negative.       Physical Exam Blood pressure (!) 138/93, pulse 83, temperature 98.3 F (36.8 C), height 5\' 6"  (1.676 m), weight 286 lb (129.7 kg), SpO2 97 %. Last Weight  Most recent update: 09/09/2020  9:05 AM   Weight  129.7 kg (286 lb)             CONSTITUTIONAL: Well developed, and nourished, appropriately responsive and aware without distress.  Morbidly obese male. EYES: Sclera non-icteric.   EARS, NOSE, MOUTH AND THROAT: Mask worn.    Hearing is intact to voice.  NECK: Trachea is midline, and there is no jugular venous distension.  LYMPH NODES:  Lymph nodes in the neck are not enlarged. RESPIRATORY:  Lungs are clear, and breath sounds are equal bilaterally. Normal respiratory effort without pathologic use of accessory muscles. CARDIOVASCULAR: Heart is regular in rate and rhythm. GI: The abdomen is soft, nontender, and nondistended. There were no palpable masses. I did not appreciate hepatosplenomegaly. There were normal bowel sounds. MUSCULOSKELETAL:  Symmetrical muscle tone appreciated in all four extremities.    SKIN: Skin turgor is normal. No pathologic skin lesions appreciated.  NEUROLOGIC:  Motor and sensation appear grossly normal.  Cranial nerves are grossly without defect. PSYCH:  Alert and oriented to person, place and time. Affect is appropriate for situation.  Data Reviewed I have personally reviewed what is currently available of the patient's imaging, recent labs and medical records.   Labs:  CBC Latest Ref Rng & Units 05/04/2020 06/04/2015 01/17/2014  WBC 4.0 - 10.5 K/uL 13.3(H) 8.9 7.1  Hemoglobin 13.0 - 17.0 g/dL 01/19/2014 12.8(L) 13.5  Hematocrit 39 - 52 % 44.5 39.9 41.8  Platelets 150 - 400 K/uL 262 301.0 248      Imaging: Radiology review:  CLINICAL DATA:  Hematuria x1 day. Back pain. History of renal stones.  EXAM: CT ABDOMEN AND PELVIS WITHOUT CONTRAST  TECHNIQUE: Multidetector CT imaging of the abdomen and pelvis was performed following the standard protocol without IV contrast.  COMPARISON:  CT dated June 18, 2004.  FINDINGS: Lower chest: The lung bases are clear. The heart size is normal.  Hepatobiliary: The liver is normal. Normal gallbladder.There is no biliary ductal  dilation.  Pancreas: Normal contours without ductal dilatation. No peripancreatic fluid collection.  Spleen: Unremarkable.  Adrenals/Urinary Tract:  --Adrenal glands: Unremarkable.  --Right kidney/ureter: No hydronephrosis or radiopaque kidney stones.  --Left kidney/ureter: No hydronephrosis or radiopaque kidney stones.  --Urinary bladder: There is bladder wall thickening. Gas is noted within the urinary bladder.  Stomach/Bowel:  --Stomach/Duodenum: No hiatal hernia or other gastric abnormality. Normal duodenal course and caliber.  --Small bowel: Unremarkable.  --Colon: There is scattered colonic diverticula with findings suspicious for early uncomplicated sigmoid diverticulitis with there is mild adjacent fat stranding and wall thickening of the sigmoid colon.  --Appendix: Normal.  Vascular/Lymphatic: Normal course and caliber of the major abdominal vessels.  --there are mildly enlarged nonspecific retroperitoneal lymph nodes.  --No mesenteric lymphadenopathy.  --  there are few mildly enlarged inguinal and pelvic lymph nodes bilaterally.  Reproductive: Unremarkable  Other: No ascites or free air. There is nonspecific subcutaneous fat stranding involving the patient's low anterior abdominal wall.  Musculoskeletal. No acute displaced fractures.  IMPRESSION: 1. Bladder wall thickening with intraluminal gas. Findings may be secondary to recent instrumentation. Correlation with urinalysis is recommended to help exclude an underlying cystitis. 2. Findings suspicious for early uncomplicated sigmoid diverticulitis in the appropriate clinical setting. 3. No radiopaque kidney stones. No hydronephrosis. 4. Nonspecific subcutaneous fat stranding involving the patient's low anterior abdominal wall. Correlation with physical exam is recommended. 5. Mildly enlarged nonspecific retroperitoneal and pelvic lymph nodes, likely  reactive.   Electronically Signed   By: Katherine Mantle M.D.   On: 05/04/2020 16:14  Within last 24 hrs: No results found.  Assessment    Colovesical fistula, likely secondary to complicated diverticulitis. Patient Active Problem List   Diagnosis Date Noted  . Asthma 10/01/2016  . Morbid obesity (HCC) 02/06/2015  . OSA (obstructive sleep apnea) 10/24/2014  . Anxiety and depression 01/21/2010  . CHRONIC PAIN SYNDROME 01/21/2010  . HYPERTENSION, MILD 01/21/2010  . Osteoarthritis 01/21/2010    Plan    I have asked Dr. Servando Snare, if he might be able to expedite a screening colonoscopy to ensure there is no other colonic pathology to address.  I would like to coordinate that bowel prep to follow with a robotic sigmoid colectomy.  Would appreciate if Dr. Lonna Cobb would be able to assist me with utilizing ICG contrast to visualize his ureters for this procedure. Hopefully this can all be sequenced, and logistically ordered for the next week or so. Risks of this procedure been discussed in detail with the patient, minimally invasive surgery would be on his benefit considering his morbid obesity status.  Risks of an open operation, complications of infection or bleeding and the need for possible stoma are discussed.  I believe he understands and desires to proceed.  Face-to-face time spent with the patient and accompanying care providers(if present) was 30 minutes, with more than 50% of the time spent counseling, educating, and coordinating care of the patient.      [name] M.D., FACS 09/09/2020, 9:45 AM

## 2020-09-09 NOTE — Patient Instructions (Addendum)
We will refer you to [facility] to get set up for a colonoscopy as soon as possible.   We will try to get you seen by GI today if possible, we will call you about this.   We have discussed removing a portion of your damaged colon through 4 small incisions today. This surgery will be done at [facility] with Dr.Rodenberg. Please plan a hospital stay of 3-7 days for surgery and recovery time.  We are planning on coordinating this surgery with your colonoscopy so you only have to prep for that. We will have you complete a bowel prep prior to your surgery. Please see information provided. You have also been given a (Blue) Pre-Care Sheet with more information regarding your particular surgery. Please review all information given.  Please call our office with any questions or concerns prior to your scheduled surgery.   Laparoscopic Colectomy Laparoscopic colectomy is surgery to remove part or all of the large intestine (colon). This procedure may be used to treat several conditions, including:  Inflammation and infection of the colon (diverticulitis).  Tumors or masses in the colon.  Inflammatory bowel disease, such as Crohn disease or ulcerative colitis. Colectomy is an option when symptoms cannot be controlled with medicines.  Bleeding from the colon that cannot be controlled by another method.  Blockage or obstruction of the colon.  Tell a health care provider about:  Any allergies you have.  All medicines you are taking, including vitamins, herbs, eye drops, creams, and over-the-counter medicines.  Any problems you or family members have had with anesthetic medicines.  Any blood disorders you have.  Any surgeries you have had.  Any medical conditions you have. What are the risks? Generally, this is a safe procedure. However, problems may occur, including:  Infection.  Bleeding.  Allergic reactions to medicines or dyes.  Damage to other structures or organs.  Leaking from  where the colon was sewn together.  Future blockage of the small intestines from scar tissue. Another surgery may be needed to repair this.  Needing to convert to an open procedure. Complications such as damage to other organs or excessive bleeding may require the surgeon to convert from a laparoscopic procedure to an open procedure. This involves making a larger incision in the abdomen.  What happens before the procedure? Staying hydrated Follow instructions from your health care provider about hydration, which may include:  Up to 2 hours before the procedure - you may continue to drink clear liquids, such as water, clear fruit juice, black coffee, and plain tea.  Eating and drinking restrictions Follow instructions from your health care provider about eating and drinking, which may include:  8 hours before the procedure - stop eating heavy meals, meals with high fiber, or foods such as meat, fried foods, or fatty foods.  6 hours before the procedure - stop eating light meals or foods, such as toast or cereal.  6 hours before the procedure - stop drinking milk or drinks that contain milk.  2 hours before the procedure - stop drinking clear liquids.  Medicines  Ask your health care provider about: ? Changing or stopping your regular medicines. This is especially important if you are taking diabetes medicines or blood thinners. ? Taking medicines such as aspirin and ibuprofen. These medicines can thin your blood. Do not take these medicines before your procedure if your health care provider instructs you not to.  You may be given antibiotic medicine to clean out bacteria from your colon.  Follow the directions carefully and take the medicine at the correct time. General instructions  You may be prescribed an oral bowel prep to clean out your colon in preparation for the surgery: ? Follow instructions from your health care provider about how to do this. ? Do not eat or drink anything  else after you have started the bowel prep, unless your health care provider tells you it is safe to do so.  Do not use any products that contain nicotine or tobacco, such as cigarettes and e-cigarettes. If you need help quitting, ask your health care provider. What happens during the procedure?  To reduce your risk of infection: ? Your health care team will wash or sanitize their hands. ? Your skin will be washed with soap.  An IV tube will be inserted into one of your veins to deliver fluid and medication.  You will be given one of the following: ? A medicine to help you relax (sedative). ? A medicine to make you fall asleep (general anesthetic).  Small monitors will be connected to your body. They will be used to check your heart, blood pressure, and oxygen level.  A breathing tube may be placed into your lungs during the procedure.  A thin, flexible tube (catheter) will be placed into your bladder to drain urine.  A tube may be placed through your nose and into your stomach to drain stomach fluids (nasogastric tube, or NG tube).  Your abdomen will be filled with air so it expands. This gives the surgeon more room to operate and makes your organs easier to see.  Several small cuts (incisions) will be made in your abdomen.  A thin, lighted tube with a tiny camera on the end (laparoscope) will be put through one of the small incisions. The camera on the laparoscope will send a picture to a computer screen in the operating room. This will give the surgeon a good view inside your abdomen.  Hollow tubes will be put through the other small incisions in your abdomen. The tools that are needed for the procedure will be put through these tubes.  Clamps or staples will be put on both ends of the diseased part of the colon.  The part of the intestine between the clamps or staples will be removed.  If possible, the ends of the healthy colon that remain will be stitched (sutured) or stapled  together to allow your body to pass waste (stool).  Sometimes, the remaining colon cannot be stitched back together. If this is the case, a colostomy will be needed. If you need a colostomy: ? An opening to the outside of your body (stoma) will be made through your abdomen. ? The end of your colon will be brought to the opening. It will be stitched to the skin. ? A bag will be attached to the opening. Stool will drain into this removable bag. ? The colostomy may be temporary or permanent.  The incisions from the colectomy will be closed with sutures or staples. The procedure may vary among health care providers and hospitals. What happens after the procedure?  Your blood pressure, heart rate, breathing rate, and blood oxygen level will be monitored until the medicines you were given have worn off.  You will receive fluids through an IV tube until your bowels start to work properly.  Once your bowels are working again, you will be given clear liquids first and then solid food as tolerated.  You will be given medicines to  control your pain and nausea, if needed.  Do not drive for 24 hours if you were given a sedative. This information is not intended to replace advice given to you by your health care provider. Make sure you discuss any questions you have with your health care provider. Document Released: 03/05/2003 Document Revised: 09/13/2016 Document Reviewed: 09/13/2016 Elsevier Interactive Patient Education  2018 [employer].     Laparoscopic Colectomy, Care After This sheet gives you information about how to care for yourself after your procedure. Your health care provider may also give you more specific instructions. If you have problems or questions, contact your health care provider. What can I expect after the procedure? After your procedure, it is common to have the following:  Pain in your abdomen, especially in the incision areas. You will be given medicine to control the  pain.  Tiredness. This is a normal part of the recovery process. Your energy level will return to normal over the next several weeks.  Changes in your bowel movements, such as constipation or needing to go more often. Talk with your health care provider about how to manage this.  Follow these instructions at home: Medicines  Take over-the-counter and prescription medicines only as told by your health care provider.  Do not drive or use heavy machinery while taking prescription pain medicine.  Do not drink alcohol while taking prescription pain medicine.  If you were prescribed an antibiotic medicine, use it as told by your health care provider. Do not stop using the antibiotic even if you start to feel better. Incision care  Follow instructions from your health care provider about how to take care of your incision areas. Make sure you: ? Keep your incisions clean and dry. ? Wash your hands with soap and water before and after applying medicine to the areas, and before and after changing your bandage (dressing). If soap and water are not available, use hand sanitizer. ? Change your dressing as told by your health care provider. ? Leave stitches (sutures), skin glue, or adhesive strips in place. These skin closures may need to stay in place for 2 weeks or longer. If adhesive strip edges start to loosen and curl up, you may trim the loose edges. Do not remove adhesive strips completely unless your health care provider tells you to do that.  Do not wear tight clothing over the incisions. Tight clothing may rub and irritate the incision areas, which may cause the incisions to open.  Do not take baths, swim, or use a hot tub until your health care provider approves. Ask your health care provider if you can take showers. You may only be allowed to take sponge baths for bathing.  Check your incision area every day for signs of infection. Check for: ? More redness, swelling, or pain. ? More  fluid or blood. ? Warmth. ? Pus or a bad smell. Activity  Avoid lifting anything that is heavier than 10 lb (4.5 kg) for 2 weeks or until your health care provider says it is okay.  You may resume normal activities as told by your health care provider. Ask your health care provider what activities are safe for you.  Take rest breaks during the day as needed. Eating and drinking  Follow instructions from your health care provider about what you can eat after surgery.  To prevent or treat constipation while you are taking prescription pain medicine, your health care provider may recommend that you: ? Drink enough fluid to  keep your urine clear or pale yellow. ? Take over-the-counter or prescription medicines. ? Eat foods that are high in fiber, such as fresh fruits and vegetables, whole grains, and beans. ? Limit foods that are high in fat and processed sugars, such as fried and sweet foods. General instructions  Ask your health care provider when you will need an appointment to get your sutures or staples removed.  Keep all follow-up visits as told by your health care provider. This is important. Contact a health care provider if:  You have more redness, swelling, or pain around your incisions.  You have more fluid or blood coming from the incisions.  Your incisions feel warm to the touch.  You have pus or a bad smell coming from your incisions or your dressing.  You have a fever.  You have an incision that breaks open (edges not staying together) after sutures or staples have been removed. Get help right away if:  You develop a rash.  You have chest pain or difficulty breathing.  You have pain or swelling in your legs.  You feel light-headed or you faint.  Your abdomen swells (becomes distended).  You have nausea or vomiting.  You have blood in your stool (feces). This information is not intended to replace advice given to you by your health care provider. Make  sure you discuss any questions you have with your health care provider. Document Released: 07/02/2005 Document Revised: 09/13/2016 Document Reviewed: 09/13/2016 Elsevier Interactive Patient Education  [employer]2018 Elsevier Inc.

## 2020-09-09 NOTE — Telephone Encounter (Signed)
Patient notified he will be seen today at Dr Annabell Sabal office at 3:30 pm at the [facility] location.

## 2020-09-09 NOTE — Progress Notes (Signed)
Gastroenterology Consultation  Referring Provider:     Lorrene Reid, PA-C Primary Care Physician:  Lorrene Reid, PA-C Primary Gastroenterologist:  Dr. Allen Norris     Reason for Consultation:     Colovesicular fistula        HPI:   Jordan [name] is a 39 y.o. y/o male referred for consultation & management of Colovesicular fistula by Dr. Lorrene Reid, PA-C.  This patient comes in today after being found to have a colovesicular fistula and is being followed by surgery.  The patient was seen by surgery today who recommended the patient be seen immediately by GI for evaluation.  The patient states that he had a bout of diverticulitis couple months ago was put on antibiotics.  The patient then had recurrent UTIs and states that he also has air expelled when he urinates.  The patient denies any unexplained weight loss, he also denies any family history of colon cancer colon polyps.  The patient denies any rectal bleeding.  The patient reports that he has been having abdominal pain when he urinates but no change in bowel habits.  He denies having any recent bouts of abdominal pain similar to when he had diverticulitis.  The patient has been presumed to have the history of the due to his diverticulitis   Past Medical History:  Diagnosis Date  . Anxiety and depression   . Chronic pain syndrome   . HYPERTENSION, MILD   . OSTEOARTHRITIS   . Recurrent UTI   . Sleep apnea     Past Surgical History:  Procedure Laterality Date  . KNEE SURGERY Right    7 total surgeries  . REPLACEMENT TOTAL KNEE Right     Prior to Admission medications   Medication Sig Start Date End Date Taking? Authorizing Provider  albuterol (VENTOLIN HFA) 108 (90 Base) MCG/ACT inhaler Inhale 2 puffs into the lungs every 6 (six) hours as needed for wheezing or shortness of breath. 06/27/19  Yes Shelda Pal, DO  aspirin 81 MG tablet Take 81 mg by mouth daily.    Yes [provider]    cetirizine (ZYRTEC) 10 MG tablet Take 1 tablet (10 mg total) by mouth daily. 07/02/20  Yes Abonza, Maritza, PA-C  Na Sulfate-K Sulfate-Mg Sulf 17.5-3.13-1.6 GM/177ML SOLN Take 1 kit by mouth once for 1 dose. 09/09/20 09/09/20  Lucilla Lame, MD    Family History  Problem Relation Age of Onset  . Heart disease Father   . Heart attack Father   . Diabetes Father   . High Cholesterol Father   . High blood pressure Father   . Heart disease Paternal Grandfather   . High Cholesterol Paternal Grandfather   . Heart disease Paternal Grandmother   . Heart attack Paternal Grandmother   . Diabetes Paternal Grandmother   . Heart disease Paternal Uncle   . Cancer Paternal Uncle   . Heart attack Paternal Uncle   . Diabetes Paternal Uncle   . High Cholesterol Paternal Uncle   . Diabetes Paternal Aunt   . Hypertension Maternal Uncle   . Cancer Maternal Aunt        lung  . Cancer Maternal Uncle        lung  . Depression Mother   . Dementia Mother      Social History   Tobacco Use  . Smoking status: Former Smoker    Years: 2.00    Types: Cigars    Quit date: 01/18/2014    Years  since quitting: 6.6  . Smokeless tobacco: Never Used  Vaping Use  . Vaping Use: Never used  Substance Use Topics  . Alcohol use: Yes    Alcohol/week: 7.0 standard drinks    Types: 7 Standard drinks or equivalent per week  . Drug use: No    Allergies as of 09/09/2020 - Review Complete 09/09/2020  Allergen Reaction Noted  . Butrans [buprenorphine hcl]  02/25/2012  . Nucynta [tapentadol hydrochloride] Anaphylaxis 02/25/2012  . Tapentadol hcl Anaphylaxis 02/25/2012  . Glucose polymer Other (See Comments) 12/02/2013  . Sumac  12/02/2013  . Bactrim [sulfamethoxazole-trimethoprim] Itching and Rash 09/09/2020    Review of Systems:    All systems reviewed and negative except where noted in HPI.   Physical Exam:  BP 129/83 (BP Location: Right Arm, Patient Position: Sitting, Cuff Size: Large)   Pulse 94   Temp  98.5 F (36.9 C) (Oral)   Ht _0  (1.676 m)   Wt 286 lb 9.6 oz (130 kg)   BMI 46.26 kg/m  No LMP for male patient. General:   Alert,  Well-developed, well-nourished, pleasant and cooperative in NAD Head:  Normocephalic and atraumatic. Eyes:  Sclera clear, no icterus.   Conjunctiva pink. Ears:  Normal auditory acuity. Neck:  Supple; no masses or thyromegaly. Lungs:  Respirations even and unlabored.  Clear throughout to auscultation.   No wheezes, crackles, or rhonchi. No acute distress. Heart:  Regular rate and rhythm; no murmurs, clicks, rubs, or gallops. Abdomen:  Normal bowel sounds.  No bruits.  Soft, non-tender and non-distended without masses, hepatosplenomegaly or hernias noted.  No guarding or rebound tenderness.  Negative Carnett sign.   Rectal:  Deferred.  Pulses:  Normal pulses noted. Extremities:  No clubbing or edema.  No cyanosis. Neurologic:  Alert and oriented x3;  grossly normal neurologically. Skin:  Intact without significant lesions or rashes.  No jaundice. Lymph Nodes:  No significant cervical adenopathy. Psych:  Alert and cooperative. Normal mood and affect.  Imaging Studies: No results found.  Assessment and Plan:   Jordan [name] is a 39 y.o. y/o male who comes in today with a history of diverticulitis with resulting colovesicular fistula. The patient is now being sent to me for evaluation colon prior to having surgery to repair this.  The patient will be set up for a colonoscopy to rule out any polyps or any other abnormalities that could result in this colovesicular fistula.  The patient has been explained the plan and agrees with it.    Lucilla Lame, MD. Marval Regal    Note: This dictation was prepared with Dragon dictation along with smaller phrase technology. Any transcriptional errors that result from this process are unintentional.

## 2020-09-09 NOTE — Progress Notes (Signed)
Patient ID: Jordan [name], male   DOB: 07/02/1981, 39 y.o.   MRN: [id number]  Chief Complaint: Colovesical fistula  History of Present Illness Jordan [name] is a 39 y.o. male with seemingly simultaneous presentation of his first bout with diverticulitis with bladder pain and urinary tract infection.  He admits to a somewhat significant history of constipation in the past, but no prior history of diverticulitis/no complications prior to his presentation with associated year urinary tract infection.  No history of blood per rectum, unintentional weight loss, or family history of colon cancer.  He has had no prior endoscopy, from GI.  Was evaluated weighted by his urologist, Dr. Lonna Cobb,  and a site consistent with a colovesical fistula was identified on cystoscopy.  In hindsight, the CT may also suggest this. So over the last 4 months he has been on 3 courses of antibiotics, as found that he has allergic to sulfa.  Past Medical History Past Medical History:  Diagnosis Date  . Anxiety and depression   . Chronic pain syndrome   . HYPERTENSION, MILD   . OSTEOARTHRITIS   . Recurrent UTI   . Sleep apnea       Past Surgical History:  Procedure Laterality Date  . KNEE SURGERY Right    7 total surgeries  . REPLACEMENT TOTAL KNEE Right     Allergies  Allergen Reactions  . Butrans [Buprenorphine Hcl]     Difficulty breathing, n/v  . Nucynta [Tapentadol Hydrochloride] Anaphylaxis  . Tapentadol Hcl Anaphylaxis  . Glucose Polymer Other (See Comments)    poison  . Sumac     poison  . Bactrim [Sulfamethoxazole-Trimethoprim] Itching and Rash    Current Outpatient Medications  Medication Sig Dispense Refill  . albuterol (VENTOLIN HFA) 108 (90 Base) MCG/ACT inhaler Inhale 2 puffs into the lungs every 6 (six) hours as needed for wheezing or shortness of breath. 18 g 2  . aspirin 81 MG tablet Take 81 mg by mouth daily.     . cetirizine (ZYRTEC) 10 MG tablet Take  1 tablet (10 mg total) by mouth daily. 90 tablet 0   No current facility-administered medications for this visit.    Family History Family History  Problem Relation Age of Onset  . Heart disease Father   . Heart attack Father   . Diabetes Father   . High Cholesterol Father   . High blood pressure Father   . Heart disease Paternal Grandfather   . High Cholesterol Paternal Grandfather   . Heart disease Paternal Grandmother   . Heart attack Paternal Grandmother   . Diabetes Paternal Grandmother   . Heart disease Paternal Uncle   . Cancer Paternal Uncle   . Heart attack Paternal Uncle   . Diabetes Paternal Uncle   . High Cholesterol Paternal Uncle   . Diabetes Paternal Aunt   . Hypertension Maternal Uncle   . Cancer Maternal Aunt        lung  . Cancer Maternal Uncle        lung  . Depression Mother   . Dementia Mother       Social History Social History   Tobacco Use  . Smoking status: Former Smoker    Years: 2.00    Types: Cigars    Quit date: 01/18/2014    Years since quitting: 6.6  . Smokeless tobacco: Never Used  Vaping Use  . Vaping Use: Never used  Substance Use Topics  . Alcohol use: Yes  Alcohol/week: 7.0 standard drinks    Types: 7 Standard drinks or equivalent per week  . Drug use: No        Review of Systems  Constitutional: Negative for chills and fever.  HENT: Negative.   Eyes: Negative.   Respiratory: Negative for cough.   Cardiovascular: Negative for chest pain.  Gastrointestinal: Positive for heartburn. Negative for abdominal pain, blood in stool and melena.  Genitourinary: Positive for dysuria (Pneumaturia).  Skin: Negative for itching and rash.  Neurological: Negative.   Psychiatric/Behavioral: Negative.       Physical Exam Blood pressure (!) 138/93, pulse 83, temperature 98.3 F (36.8 C), height 5\' 6"  (1.676 m), weight 286 lb (129.7 kg), SpO2 97 %. Last Weight  Most recent update: 09/09/2020  9:05 AM   Weight  129.7 kg (286 lb)             CONSTITUTIONAL: Well developed, and nourished, appropriately responsive and aware without distress.  Morbidly obese male. EYES: Sclera non-icteric.   EARS, NOSE, MOUTH AND THROAT: Mask worn.    Hearing is intact to voice.  NECK: Trachea is midline, and there is no jugular venous distension.  LYMPH NODES:  Lymph nodes in the neck are not enlarged. RESPIRATORY:  Lungs are clear, and breath sounds are equal bilaterally. Normal respiratory effort without pathologic use of accessory muscles. CARDIOVASCULAR: Heart is regular in rate and rhythm. GI: The abdomen is soft, nontender, and nondistended. There were no palpable masses. I did not appreciate hepatosplenomegaly. There were normal bowel sounds. MUSCULOSKELETAL:  Symmetrical muscle tone appreciated in all four extremities.    SKIN: Skin turgor is normal. No pathologic skin lesions appreciated.  NEUROLOGIC:  Motor and sensation appear grossly normal.  Cranial nerves are grossly without defect. PSYCH:  Alert and oriented to person, place and time. Affect is appropriate for situation.  Data Reviewed I have personally reviewed what is currently available of the patient's imaging, recent labs and medical records.   Labs:  CBC Latest Ref Rng & Units 05/04/2020 06/04/2015 01/17/2014  WBC 4.0 - 10.5 K/uL 13.3(H) 8.9 7.1  Hemoglobin 13.0 - 17.0 g/dL 01/19/2014 12.8(L) 13.5  Hematocrit 39 - 52 % 44.5 39.9 41.8  Platelets 150 - 400 K/uL 262 301.0 248      Imaging: Radiology review:  CLINICAL DATA:  Hematuria x1 day. Back pain. History of renal stones.  EXAM: CT ABDOMEN AND PELVIS WITHOUT CONTRAST  TECHNIQUE: Multidetector CT imaging of the abdomen and pelvis was performed following the standard protocol without IV contrast.  COMPARISON:  CT dated June 18, 2004.  FINDINGS: Lower chest: The lung bases are clear. The heart size is normal.  Hepatobiliary: The liver is normal. Normal gallbladder.There is no biliary ductal  dilation.  Pancreas: Normal contours without ductal dilatation. No peripancreatic fluid collection.  Spleen: Unremarkable.  Adrenals/Urinary Tract:  --Adrenal glands: Unremarkable.  --Right kidney/ureter: No hydronephrosis or radiopaque kidney stones.  --Left kidney/ureter: No hydronephrosis or radiopaque kidney stones.  --Urinary bladder: There is bladder wall thickening. Gas is noted within the urinary bladder.  Stomach/Bowel:  --Stomach/Duodenum: No hiatal hernia or other gastric abnormality. Normal duodenal course and caliber.  --Small bowel: Unremarkable.  --Colon: There is scattered colonic diverticula with findings suspicious for early uncomplicated sigmoid diverticulitis with there is mild adjacent fat stranding and wall thickening of the sigmoid colon.  --Appendix: Normal.  Vascular/Lymphatic: Normal course and caliber of the major abdominal vessels.  --there are mildly enlarged nonspecific retroperitoneal lymph nodes.  --No mesenteric lymphadenopathy.  --  there are few mildly enlarged inguinal and pelvic lymph nodes bilaterally.  Reproductive: Unremarkable  Other: No ascites or free air. There is nonspecific subcutaneous fat stranding involving the patient's low anterior abdominal wall.  Musculoskeletal. No acute displaced fractures.  IMPRESSION: 1. Bladder wall thickening with intraluminal gas. Findings may be secondary to recent instrumentation. Correlation with urinalysis is recommended to help exclude an underlying cystitis. 2. Findings suspicious for early uncomplicated sigmoid diverticulitis in the appropriate clinical setting. 3. No radiopaque kidney stones. No hydronephrosis. 4. Nonspecific subcutaneous fat stranding involving the patient's low anterior abdominal wall. Correlation with physical exam is recommended. 5. Mildly enlarged nonspecific retroperitoneal and pelvic lymph nodes, likely  reactive.   Electronically Signed   By: Katherine Mantle M.D.   On: 05/04/2020 16:14  Within last 24 hrs: No results found.  Assessment    Colovesical fistula, likely secondary to complicated diverticulitis. Patient Active Problem List   Diagnosis Date Noted  . Asthma 10/01/2016  . Morbid obesity (HCC) 02/06/2015  . OSA (obstructive sleep apnea) 10/24/2014  . Anxiety and depression 01/21/2010  . CHRONIC PAIN SYNDROME 01/21/2010  . HYPERTENSION, MILD 01/21/2010  . Osteoarthritis 01/21/2010    Plan    I have asked Dr. Servando Snare, if he might be able to expedite a screening colonoscopy to ensure there is no other colonic pathology to address.  I would like to coordinate that bowel prep to follow with a robotic sigmoid colectomy.  Would appreciate if Dr. Lonna Cobb would be able to assist me with utilizing ICG contrast to visualize his ureters for this procedure. Hopefully this can all be sequenced, and logistically ordered for the next week or so. Risks of this procedure been discussed in detail with the patient, minimally invasive surgery would be on his benefit considering his morbid obesity status.  Risks of an open operation, complications of infection or bleeding and the need for possible stoma are discussed.  I believe he understands and desires to proceed.  Face-to-face time spent with the patient and accompanying care providers(if present) was 30 minutes, with more than 50% of the time spent counseling, educating, and coordinating care of the patient.      [name] M.D., FACS 09/09/2020, 9:45 AM

## 2020-09-09 NOTE — H&P (View-Only) (Signed)
  Gastroenterology Consultation  Referring Provider:     Abonza, Maritza, PA-C Primary Care Physician:  Abonza, Maritza, PA-C Primary Gastroenterologist:  Dr. Davon Abdelaziz     Reason for Consultation:     Colovesicular fistula        HPI:   Jordan [name] is a 38 y.o. y/o male referred for consultation & management of Colovesicular fistula by Dr. Abonza, Maritza, PA-C.  This patient comes in today after being found to have a colovesicular fistula and is being followed by surgery.  The patient was seen by surgery today who recommended the patient be seen immediately by GI for evaluation.  The patient states that he had a bout of diverticulitis couple months ago was put on antibiotics.  The patient then had recurrent UTIs and states that he also has air expelled when he urinates.  The patient denies any unexplained weight loss, he also denies any family history of colon cancer colon polyps.  The patient denies any rectal bleeding.  The patient reports that he has been having abdominal pain when he urinates but no change in bowel habits.  He denies having any recent bouts of abdominal pain similar to when he had diverticulitis.  The patient has been presumed to have the history of the due to his diverticulitis   Past Medical History:  Diagnosis Date  . Anxiety and depression   . Chronic pain syndrome   . HYPERTENSION, MILD   . OSTEOARTHRITIS   . Recurrent UTI   . Sleep apnea     Past Surgical History:  Procedure Laterality Date  . KNEE SURGERY Right    7 total surgeries  . REPLACEMENT TOTAL KNEE Right     Prior to Admission medications   Medication Sig Start Date End Date Taking? Authorizing Provider  albuterol (VENTOLIN HFA) 108 (90 Base) MCG/ACT inhaler Inhale 2 puffs into the lungs every 6 (six) hours as needed for wheezing or shortness of breath. 06/27/19  Yes Wendling, Nicholas Paul, DO  aspirin 81 MG tablet Take 81 mg by mouth daily.    Yes [provider]    cetirizine (ZYRTEC) 10 MG tablet Take 1 tablet (10 mg total) by mouth daily. 07/02/20  Yes Abonza, Maritza, PA-C  Na Sulfate-K Sulfate-Mg Sulf 17.5-3.13-1.6 GM/177ML SOLN Take 1 kit by mouth once for 1 dose. 09/09/20 09/09/20  Sheray Grist, MD    Family History  Problem Relation Age of Onset  . Heart disease Father   . Heart attack Father   . Diabetes Father   . High Cholesterol Father   . High blood pressure Father   . Heart disease Paternal Grandfather   . High Cholesterol Paternal Grandfather   . Heart disease Paternal Grandmother   . Heart attack Paternal Grandmother   . Diabetes Paternal Grandmother   . Heart disease Paternal Uncle   . Cancer Paternal Uncle   . Heart attack Paternal Uncle   . Diabetes Paternal Uncle   . High Cholesterol Paternal Uncle   . Diabetes Paternal Aunt   . Hypertension Maternal Uncle   . Cancer Maternal Aunt        lung  . Cancer Maternal Uncle        lung  . Depression Mother   . Dementia Mother      Social History   Tobacco Use  . Smoking status: Former Smoker    Years: 2.00    Types: Cigars    Quit date: 01/18/2014    Years   since quitting: 6.6  . Smokeless tobacco: Never Used  Vaping Use  . Vaping Use: Never used  Substance Use Topics  . Alcohol use: Yes    Alcohol/week: 7.0 standard drinks    Types: 7 Standard drinks or equivalent per week  . Drug use: No    Allergies as of 09/09/2020 - Review Complete 09/09/2020  Allergen Reaction Noted  . Butrans [buprenorphine hcl]  02/25/2012  . Nucynta [tapentadol hydrochloride] Anaphylaxis 02/25/2012  . Tapentadol hcl Anaphylaxis 02/25/2012  . Glucose polymer Other (See Comments) 12/02/2013  . Sumac  12/02/2013  . Bactrim [sulfamethoxazole-trimethoprim] Itching and Rash 09/09/2020    Review of Systems:    All systems reviewed and negative except where noted in HPI.   Physical Exam:  BP 129/83 (BP Location: Right Arm, Patient Position: Sitting, Cuff Size: Large)   Pulse 94   Temp  98.5 F (36.9 C) (Oral)   Ht 5' 6" (1.676 m)   Wt 286 lb 9.6 oz (130 kg)   BMI 46.26 kg/m  No LMP for male patient. General:   Alert,  Well-developed, well-nourished, pleasant and cooperative in NAD Head:  Normocephalic and atraumatic. Eyes:  Sclera clear, no icterus.   Conjunctiva pink. Ears:  Normal auditory acuity. Neck:  Supple; no masses or thyromegaly. Lungs:  Respirations even and unlabored.  Clear throughout to auscultation.   No wheezes, crackles, or rhonchi. No acute distress. Heart:  Regular rate and rhythm; no murmurs, clicks, rubs, or gallops. Abdomen:  Normal bowel sounds.  No bruits.  Soft, non-tender and non-distended without masses, hepatosplenomegaly or hernias noted.  No guarding or rebound tenderness.  Negative Carnett sign.   Rectal:  Deferred.  Pulses:  Normal pulses noted. Extremities:  No clubbing or edema.  No cyanosis. Neurologic:  Alert and oriented x3;  grossly normal neurologically. Skin:  Intact without significant lesions or rashes.  No jaundice. Lymph Nodes:  No significant cervical adenopathy. Psych:  Alert and cooperative. Normal mood and affect.  Imaging Studies: No results found.  Assessment and Plan:   Jordan [name] is a 38 y.o. y/o male who comes in today with a history of diverticulitis with resulting colovesicular fistula. The patient is now being sent to me for evaluation colon prior to having surgery to repair this.  The patient will be set up for a colonoscopy to rule out any polyps or any other abnormalities that could result in this colovesicular fistula.  The patient has been explained the plan and agrees with it.    Desera Graffeo, MD. FACG    Note: This dictation was prepared with Dragon dictation along with smaller phrase technology. Any transcriptional errors that result from this process are unintentional.   

## 2020-09-10 ENCOUNTER — Other Ambulatory Visit: Payer: Self-pay | Admitting: Radiology

## 2020-09-10 ENCOUNTER — Telehealth: Payer: Self-pay | Admitting: Gastroenterology

## 2020-09-10 ENCOUNTER — Other Ambulatory Visit: Payer: Self-pay

## 2020-09-10 DIAGNOSIS — H5213 Myopia, bilateral: Secondary | ICD-10-CM | POA: Diagnosis not present

## 2020-09-10 DIAGNOSIS — N321 Vesicointestinal fistula: Secondary | ICD-10-CM

## 2020-09-10 DIAGNOSIS — H52209 Unspecified astigmatism, unspecified eye: Secondary | ICD-10-CM | POA: Diagnosis not present

## 2020-09-10 NOTE — Telephone Encounter (Signed)
Britta Mccreedy from [facility] -Dr. Anell Barr office calling to coordinate patients colonoscopy with the surgery that they have to schedule the day after his procedure. Per ov notes on 9.14.21 pt needs colon. Britta Mccreedy requesting a call to her to discuss colon date for after 9.25.21 with her, before scheduling.

## 2020-09-10 NOTE — Telephone Encounter (Signed)
Contacted Barbara from [facility] regarding coordinating pt's colonoscopy with his upcoming surgery. Pt's colonoscopy has been rescheduled to Oct 5th. Pt was unavailable at the time of my call but was able to update pt's wife of this change.

## 2020-09-11 ENCOUNTER — Telehealth: Payer: Self-pay | Admitting: Surgery

## 2020-09-11 ENCOUNTER — Telehealth: Payer: Self-pay

## 2020-09-11 NOTE — Telephone Encounter (Signed)
Patient's wife called stating patient is having painful urination and pain in testicles for the past day or so. He denies fever or chills but is having abd pain and nausea/ vomiting. He has an upcoming surgery with GI and they did speak with their office but they were instruction to contact our office due to urinary symptoms and testicle pain. Patient was made an appt with PA tomorrow for further evaluation

## 2020-09-11 NOTE — Progress Notes (Signed)
09/12/2020 4:34 PM   Jordan [name] October 20, 1981 [id number]  Referring provider: Mayer Masker, PA-C [address] [place],  [place] [address]  Chief Complaint  Patient presents with  . Dysuria    HPI: Jordan [name] is a 39 y.o. male with a colo vesicular fistula who presents today for an urgent appointment for testicular pain.  Patient was referred for pneumaturia.  Cystoscopy performed on 08/27/2020 with Dr. Lonna Cobb revealed on the left bladder base near the junction with the posterior wall is a area of raised erythematous mucosa with a central depression suspicious for the site of a colovesical fistula.  Repair is scheduled for 10/01/2020.  Patient states that he has been having painful urination.  He states that it is painful during the urinary stream and on completion of the urinary stream there is a sharp pain.  He has been having urgency, difficulty with urination, weak urinary stream back pain, lower abdominal pain and scrotal pain.    He had a positive urine culture for Klebsiella oxytoca on August 13, 2020.  He was prescribed Septra DS, but he was unable to start the antibiotic until a few days after it was prescribed due to a delivery issue with his pharmacy.  He was only able to take 1 dose of the medication before he experienced a rash and swelling in his arm.    He is also experiencing pain in his scrotal area that has been intermittent for the last several months.  Patient has a large right hydrocele and a small left varicocele as confirmed by May 2021 scrotal ultrasound.  UA contaminated with Pyridium.    PMH: Past Medical History:  Diagnosis Date  . Anxiety and depression   . Chronic pain syndrome   . HYPERTENSION, MILD   . OSTEOARTHRITIS   . Recurrent UTI   . Sleep apnea     Surgical History: Past Surgical History:  Procedure Laterality Date  . KNEE SURGERY Right    7 total surgeries  . REPLACEMENT TOTAL  KNEE Right     Home Medications:  Allergies as of 09/12/2020      Reactions   Butrans [buprenorphine Hcl]    Difficulty breathing, n/v   Nucynta [tapentadol Hydrochloride] Anaphylaxis   Tapentadol Hcl Anaphylaxis   Glucose Polymer Other (See Comments)   poison   Sumac    poison   Bactrim [sulfamethoxazole-trimethoprim] Itching, Rash      Medication List       Accurate as of September 12, 2020 11:59 PM. If you have any questions, ask your nurse or doctor.        albuterol 108 (90 Base) MCG/ACT inhaler Commonly known as: VENTOLIN HFA Inhale 2 puffs into the lungs every 6 (six) hours as needed for wheezing or shortness of breath.   aspirin 81 MG tablet Take 81 mg by mouth daily.   cetirizine 10 MG tablet Commonly known as: ZYRTEC Take 1 tablet (10 mg total) by mouth daily.   ciprofloxacin 500 MG tablet Commonly known as: CIPRO Take 1 tablet (500 mg total) by mouth every 12 (twelve) hours. Started by: Michiel Cowboy, PA-C   phenazopyridine 95 MG tablet Commonly known as: PYRIDIUM Take 95 mg by mouth in the morning and at bedtime.       Allergies:  Allergies  Allergen Reactions  . Butrans [Buprenorphine Hcl]     Difficulty breathing, n/v  . Nucynta [Tapentadol Hydrochloride] Anaphylaxis  . Tapentadol Hcl Anaphylaxis  .  Glucose Polymer Other (See Comments)    poison  . Sumac     poison  . Bactrim [Sulfamethoxazole-Trimethoprim] Itching and Rash    Family History: Family History  Problem Relation Age of Onset  . Heart disease Father   . Heart attack Father   . Diabetes Father   . High Cholesterol Father   . High blood pressure Father   . Heart disease Paternal Grandfather   . High Cholesterol Paternal Grandfather   . Heart disease Paternal Grandmother   . Heart attack Paternal Grandmother   . Diabetes Paternal Grandmother   . Heart disease Paternal Uncle   . Cancer Paternal Uncle   . Heart attack Paternal Uncle   . Diabetes Paternal Uncle   .  High Cholesterol Paternal Uncle   . Diabetes Paternal Aunt   . Hypertension Maternal Uncle   . Cancer Maternal Aunt        lung  . Cancer Maternal Uncle        lung  . Depression Mother   . Dementia Mother     Social History:  reports that he quit smoking about 6 years ago. His smoking use included cigars. He quit after 2.00 years of use. He has never used smokeless tobacco. He reports current alcohol use of about 7.0 standard drinks of alcohol per week. He reports that he does not use drugs.  ROS: Pertinent ROS in HPI  Physical Exam: BP 137/87   Pulse 96   Wt 286 lb (129.7 kg)   BMI 46.16 kg/m   Constitutional:  Well nourished. Alert and oriented, No acute distress. HEENT: [place] AT, mask in place.  Trachea midline Cardiovascular: No clubbing, cyanosis, or edema. Respiratory: Normal respiratory effort, no increased work of breathing. GU: No CVA tenderness.  No bladder fullness or masses.  Patient with uncircumcised phallus. Foreskin easily retracted   Urethral meatus is patent.  No penile discharge. No penile lesions or rashes. Scrotum without lesions, cysts, rashes and/or edema.  Testicles are located scrotally bilaterally. Right hydrocele (golf ball size).  Left varicocele.  No masses are appreciated in the testicles. Left and right epididymis are normal. Neurologic: Grossly intact, no focal deficits, moving all 4 extremities. Psychiatric: Normal mood and affect.  Laboratory Data: Lab Results  Component Value Date   WBC 13.3 (H) 05/04/2020   HGB 14.0 05/04/2020   HCT 44.5 05/04/2020   MCV 84.4 05/04/2020   PLT 262 05/04/2020    Lab Results  Component Value Date   CREATININE 0.90 05/04/2020    Lab Results  Component Value Date   TSH 3.98 03/16/2016       Component Value Date/Time   CHOL 168 09/17/2015 1107   HDL 49.40 09/17/2015 1107   CHOLHDL 3 09/17/2015 1107   VLDL 16.8 09/17/2015 1107   LDLCALC 101 (H) 09/17/2015 1107    Lab Results  Component Value Date     AST 15 06/04/2015   Lab Results  Component Value Date   ALT 13 06/04/2015    Urinalysis Component     Latest Ref Rng & Units 09/12/2020  Specific Gravity, UA      CANCELED  pH, UA      CANCELED  Color, UA     Yellow Orange  Appearance Ur     Clear Hazy (A)  Protein,UA      CANCELED  Glucose, UA      CANCELED  Ketones, UA      CANCELED  Microscopic Examination  See below:   Component     Latest Ref Rng & Units 09/12/2020  WBC, UA     0 - 5 /hpf 0-5  RBC     0 - 2 /hpf 0-2  Epithelial Cells (non renal)     0 - 10 /hpf 0-10  Casts     None seen /lpf Present (A)  Cast Type     N/A Hyaline casts  Crystals     N/A Present (A)  Crystal Type     N/A Amorphous Sediment  Bacteria, UA     None seen/Few Few    I have reviewed the labs.   Pertinent Imaging: No imaging since last visit  Assessment & Plan:    1. Colovesical fistula - scheduled colonoscopy on 10/05 - colovesical repair/robotic sigmoid colectomy on 10/06  2. Recurrent UTI - secondary to colovesical fistula - UA sent for culture - Started on ciprofloxacin 500 mg twice daily while awaiting for sensitivity results  3. Hydrocele - Explained the natural history of hydroceles with the patient - Explained that this can cause pain from time to time - will reassess after he recovers from surgery    Return for Patient to follow up after surgery - TBD.  These notes generated with voice recognition software. I apologize for typographical errors.  Michiel Cowboy, PA-C  [facility] [address]  [address] [place], [place] [address] [phone]

## 2020-09-11 NOTE — Telephone Encounter (Signed)
Wife calls stating that her husband has been throwing up and in the bed.  Says he is really sick, he hasn't been able to get out of bed.  Spoke with Dr. Claudine Mouton and patient needs to either go to the ER or urgent care.  Wife voices understanding.

## 2020-09-11 NOTE — Telephone Encounter (Signed)
Outgoing call is made, spoke with wife, Efraim Kaufmann.  Both have been advised of Pre-Admission date/time, COVID Testing date and Surgery date.  Surgery Date: 10/01/20 Preadmission Testing Date: 09/22/20 (phone 1p-5p) Covid Testing Date: 09/29/20 - patient advised to [facility] ([address]) between 8a-1p  Patient has been made aware to call [phone], between 1-3:00pm the day before surgery, to find out what time to arrive for surgery.

## 2020-09-11 NOTE — H&P (View-Only) (Signed)
09/12/2020 4:34 PM   Jordan [name] October 20, 1981 [id number]  Referring provider: Mayer Masker, PA-C [address] [place],  [place] [address]  Chief Complaint  Patient presents with  . Dysuria    HPI: Jordan [name] is a 39 y.o. male with a colo vesicular fistula who presents today for an urgent appointment for testicular pain.  Patient was referred for pneumaturia.  Cystoscopy performed on 08/27/2020 with Dr. Lonna Cobb revealed on the left bladder base near the junction with the posterior wall is a area of raised erythematous mucosa with a central depression suspicious for the site of a colovesical fistula.  Repair is scheduled for 10/01/2020.  Patient states that he has been having painful urination.  He states that it is painful during the urinary stream and on completion of the urinary stream there is a sharp pain.  He has been having urgency, difficulty with urination, weak urinary stream back pain, lower abdominal pain and scrotal pain.    He had a positive urine culture for Klebsiella oxytoca on August 13, 2020.  He was prescribed Septra DS, but he was unable to start the antibiotic until a few days after it was prescribed due to a delivery issue with his pharmacy.  He was only able to take 1 dose of the medication before he experienced a rash and swelling in his arm.    He is also experiencing pain in his scrotal area that has been intermittent for the last several months.  Patient has a large right hydrocele and a small left varicocele as confirmed by May 2021 scrotal ultrasound.  UA contaminated with Pyridium.    PMH: Past Medical History:  Diagnosis Date  . Anxiety and depression   . Chronic pain syndrome   . HYPERTENSION, MILD   . OSTEOARTHRITIS   . Recurrent UTI   . Sleep apnea     Surgical History: Past Surgical History:  Procedure Laterality Date  . KNEE SURGERY Right    7 total surgeries  . REPLACEMENT TOTAL  KNEE Right     Home Medications:  Allergies as of 09/12/2020      Reactions   Butrans [buprenorphine Hcl]    Difficulty breathing, n/v   Nucynta [tapentadol Hydrochloride] Anaphylaxis   Tapentadol Hcl Anaphylaxis   Glucose Polymer Other (See Comments)   poison   Sumac    poison   Bactrim [sulfamethoxazole-trimethoprim] Itching, Rash      Medication List       Accurate as of September 12, 2020 11:59 PM. If you have any questions, ask your nurse or doctor.        albuterol 108 (90 Base) MCG/ACT inhaler Commonly known as: VENTOLIN HFA Inhale 2 puffs into the lungs every 6 (six) hours as needed for wheezing or shortness of breath.   aspirin 81 MG tablet Take 81 mg by mouth daily.   cetirizine 10 MG tablet Commonly known as: ZYRTEC Take 1 tablet (10 mg total) by mouth daily.   ciprofloxacin 500 MG tablet Commonly known as: CIPRO Take 1 tablet (500 mg total) by mouth every 12 (twelve) hours. Started by: Michiel Cowboy, PA-C   phenazopyridine 95 MG tablet Commonly known as: PYRIDIUM Take 95 mg by mouth in the morning and at bedtime.       Allergies:  Allergies  Allergen Reactions  . Butrans [Buprenorphine Hcl]     Difficulty breathing, n/v  . Nucynta [Tapentadol Hydrochloride] Anaphylaxis  . Tapentadol Hcl Anaphylaxis  .  Glucose Polymer Other (See Comments)    poison  . Sumac     poison  . Bactrim [Sulfamethoxazole-Trimethoprim] Itching and Rash    Family History: Family History  Problem Relation Age of Onset  . Heart disease Father   . Heart attack Father   . Diabetes Father   . High Cholesterol Father   . High blood pressure Father   . Heart disease Paternal Grandfather   . High Cholesterol Paternal Grandfather   . Heart disease Paternal Grandmother   . Heart attack Paternal Grandmother   . Diabetes Paternal Grandmother   . Heart disease Paternal Uncle   . Cancer Paternal Uncle   . Heart attack Paternal Uncle   . Diabetes Paternal Uncle   .  High Cholesterol Paternal Uncle   . Diabetes Paternal Aunt   . Hypertension Maternal Uncle   . Cancer Maternal Aunt        lung  . Cancer Maternal Uncle        lung  . Depression Mother   . Dementia Mother     Social History:  reports that he quit smoking about 6 years ago. His smoking use included cigars. He quit after 2.00 years of use. He has never used smokeless tobacco. He reports current alcohol use of about 7.0 standard drinks of alcohol per week. He reports that he does not use drugs.  ROS: Pertinent ROS in HPI  Physical Exam: BP 137/87   Pulse 96   Wt 286 lb (129.7 kg)   BMI 46.16 kg/m   Constitutional:  Well nourished. Alert and oriented, No acute distress. HEENT: [place] AT, mask in place.  Trachea midline Cardiovascular: No clubbing, cyanosis, or edema. Respiratory: Normal respiratory effort, no increased work of breathing. GU: No CVA tenderness.  No bladder fullness or masses.  Patient with uncircumcised phallus. Foreskin easily retracted   Urethral meatus is patent.  No penile discharge. No penile lesions or rashes. Scrotum without lesions, cysts, rashes and/or edema.  Testicles are located scrotally bilaterally. Right hydrocele (golf ball size).  Left varicocele.  No masses are appreciated in the testicles. Left and right epididymis are normal. Neurologic: Grossly intact, no focal deficits, moving all 4 extremities. Psychiatric: Normal mood and affect.  Laboratory Data: Lab Results  Component Value Date   WBC 13.3 (H) 05/04/2020   HGB 14.0 05/04/2020   HCT 44.5 05/04/2020   MCV 84.4 05/04/2020   PLT 262 05/04/2020    Lab Results  Component Value Date   CREATININE 0.90 05/04/2020    Lab Results  Component Value Date   TSH 3.98 03/16/2016       Component Value Date/Time   CHOL 168 09/17/2015 1107   HDL 49.40 09/17/2015 1107   CHOLHDL 3 09/17/2015 1107   VLDL 16.8 09/17/2015 1107   LDLCALC 101 (H) 09/17/2015 1107    Lab Results  Component Value Date     AST 15 06/04/2015   Lab Results  Component Value Date   ALT 13 06/04/2015    Urinalysis Component     Latest Ref Rng & Units 09/12/2020  Specific Gravity, UA      CANCELED  pH, UA      CANCELED  Color, UA     Yellow Orange  Appearance Ur     Clear Hazy (A)  Protein,UA      CANCELED  Glucose, UA      CANCELED  Ketones, UA      CANCELED  Microscopic Examination  See below:   Component     Latest Ref Rng & Units 09/12/2020  WBC, UA     0 - 5 /hpf 0-5  RBC     0 - 2 /hpf 0-2  Epithelial Cells (non renal)     0 - 10 /hpf 0-10  Casts     None seen /lpf Present (A)  Cast Type     N/A Hyaline casts  Crystals     N/A Present (A)  Crystal Type     N/A Amorphous Sediment  Bacteria, UA     None seen/Few Few    I have reviewed the labs.   Pertinent Imaging: No imaging since last visit  Assessment & Plan:    1. Colovesical fistula - scheduled colonoscopy on 10/05 - colovesical repair/robotic sigmoid colectomy on 10/06  2. Recurrent UTI - secondary to colovesical fistula - UA sent for culture - Started on ciprofloxacin 500 mg twice daily while awaiting for sensitivity results  3. Hydrocele - Explained the natural history of hydroceles with the patient - Explained that this can cause pain from time to time - will reassess after he recovers from surgery    Return for Patient to follow up after surgery - TBD.  These notes generated with voice recognition software. I apologize for typographical errors.  Michiel Cowboy, PA-C  [facility] [address]  [address] [place], [place] [address] [phone]

## 2020-09-11 NOTE — Telephone Encounter (Signed)
Spoke with patient's wife in regards to his procedure. She states husband is experiencing abdominal pain and pain during urination. I informed Dr. Annabell Sabal CMA and she instructed patient to reach out to his urologist. Pt. wife verbalized understanding.

## 2020-09-12 ENCOUNTER — Ambulatory Visit (INDEPENDENT_AMBULATORY_CARE_PROVIDER_SITE_OTHER): Payer: Medicare HMO | Admitting: Urology

## 2020-09-12 ENCOUNTER — Other Ambulatory Visit: Payer: Self-pay

## 2020-09-12 VITALS — BP 137/87 | HR 96 | Wt 286.0 lb

## 2020-09-12 DIAGNOSIS — N321 Vesicointestinal fistula: Secondary | ICD-10-CM | POA: Diagnosis not present

## 2020-09-12 DIAGNOSIS — N433 Hydrocele, unspecified: Secondary | ICD-10-CM

## 2020-09-12 DIAGNOSIS — N390 Urinary tract infection, site not specified: Secondary | ICD-10-CM

## 2020-09-12 MED ORDER — CIPROFLOXACIN HCL 500 MG PO TABS: 500.0000 mg | ORAL_TABLET | Freq: Two times a day (BID) | ORAL | 0 refills | Status: DC

## 2020-09-15 LAB — URINALYSIS, COMPLETE

## 2020-09-15 LAB — MICROSCOPIC EXAMINATION

## 2020-09-16 ENCOUNTER — Telehealth: Payer: Self-pay | Admitting: Pulmonary Disease

## 2020-09-16 NOTE — Telephone Encounter (Signed)
Spoke with the pt's spouse  She states that the pt's CPAP has been recalled  He had only had it for 2 months  She states he had problems with the machine the whole time- sore throat and nose bleeds  She states she reported recall to [facility] and has not heard anything  Please advise thanks

## 2020-09-18 ENCOUNTER — Encounter: Payer: Self-pay | Admitting: Urology

## 2020-09-18 NOTE — Telephone Encounter (Signed)
DL has been placed in your lookat

## 2020-09-18 NOTE — Telephone Encounter (Signed)
I believe the recall is only related to their older machines and not to the new dream station machines They will have to check with [employer] about this Can we please obtain download on his machine I believe he was set on auto settings 10 to 18 cm

## 2020-09-19 NOTE — Telephone Encounter (Signed)
That is my mistake.  The download that I reviewed was from his old machine which was a Hotel manager from back in [place]. His current machine is a Youth worker and is on recall. Average pressure is 12 cm.   I reviewed that has sleep apnea was moderate degree. His options are -If he has his old ResMed machine he can get back on it -He can stop using his CPAP until he gets a replacement -He can continue using his DreamStation until his replacement

## 2020-09-19 NOTE — Telephone Encounter (Signed)
Spoke with the pt's spouse  I notified of response per Dr Vassie Loll  She states concerned bc pt has upcoming surgery  She has other questions  I advised needs to schedule appt to discuss concerns  She will call back to do so

## 2020-09-19 NOTE — Telephone Encounter (Signed)
He has a ResMed CPAP so this has not been affected by the recall. He is on auto CPAP 5 to 20 cm and download shows good control of events with average pressure 18 cm. He needs to continue using his CPAP machine.  CPAP has been very effective. please change settings to auto 10 to 20 cm

## 2020-09-19 NOTE — Telephone Encounter (Signed)
Spoke with patient's wife, Jordan [name] who stated that the CPAP machine her husband has is a dreamworks and the DME told her they will be sending a new machine. She stated he has not used the CPAP machine in a couple of months now. Do I still put in the new cpap setting orders?

## 2020-09-20 LAB — CULTURE, URINE COMPREHENSIVE

## 2020-09-22 ENCOUNTER — Telehealth: Payer: Self-pay | Admitting: Family Medicine

## 2020-09-22 ENCOUNTER — Other Ambulatory Visit: Payer: Self-pay

## 2020-09-22 ENCOUNTER — Encounter
Admission: RE | Admit: 2020-09-22 | Discharge: 2020-09-22 | Disposition: A | Discharge status: Home / Self Care | Payer: Medicare HMO | Source: Ambulatory Visit | Attending: Surgery | Admitting: Surgery

## 2020-09-22 HISTORY — DX: Personal history of urinary calculi: Z87.442

## 2020-09-22 HISTORY — DX: Unspecified asthma, uncomplicated: J45.909

## 2020-09-22 NOTE — Patient Instructions (Signed)
Your procedure is scheduled on: Wednesday October 01, 2020. Report to Day Surgery inside [facility] 2nd floor. To find out your arrival time please call [phone] between 1PM - 3PM on Tuesday August 31, 2020.  Remember: Instructions that are not followed completely may result in serious medical risk,  up to and including death, or upon the discretion of your surgeon and anesthesiologist your  surgery may need to be rescheduled.     _X__ 1. Do not eat food after midnight the night before your procedure.                 No chewing gum or hard candies. You may drink clear liquids up to 2 hours                 before you are scheduled to arrive for your surgery- DO not drink clear                 liquids within 2 hours of the start of your surgery.                 Clear Liquids include:  water, apple juice without pulp, clear Gatorade, G2 or                  Gatorade Zero (avoid Red/Purple/Blue), Black Coffee or Tea (Do not add                 anything to coffee or tea).  __X__2.  On the morning of surgery brush your teeth with toothpaste and water, you                may rinse your mouth with mouthwash if you wish.  Do not swallow any toothpaste of mouthwash.     _X__ 3.  No Alcohol for 24 hours before or after surgery.   _X__ 4.  Do Not Smoke or use e-cigarettes For 24 Hours Prior to Your Surgery.                 Do not use any chewable tobacco products for at least 6 hours prior to                 Surgery.  _X__  5.  Do not use any recreational drugs (marijuana, cocaine, heroin, ecstasy, MDMA or other)                For at least one week prior to your surgery.  Combination of these drugs with anesthesia                May have life threatening results.  __x__ 6.  Notify your doctor if there is any change in your medical condition      (cold, fever, infections).     Do not wear jewelry, make-up, hairpins, clips or nail polish. Do not wear lotions,  powders, or perfumes. You may wear deodorant. Do not shave 48 hours prior to surgery. Men may shave face and neck. Do not bring valuables to the hospital.    [facility] is not responsible for any belongings or valuables.  Contacts, dentures or bridgework may not be worn into surgery. Leave your suitcase in the car. After surgery it may be brought to your room. For patients admitted to the hospital, discharge time is determined by your treatment team.   Patients discharged the day of surgery will not be allowed to drive home.   Make arrangements for someone to be  with you for the first 24 hours of your Same Day Discharge.   ____ Take these medicines the morning of surgery with A SIP OF WATER:    1. None     __x__ Use CHG Soap (or wipes) as directed   __x__ Use inhalers on the day of surgery  albuterol (VENTOLIN HFA) 108 (90 Base) MCG/ACT inhaler  __x__ Stop Anti-inflammatories such as Ibuprofen, Aleve, Advil, naproxen, aspirin and or BC powders.    __x__ Stop supplements until after surgery.    __x__  Do not start any herbal supplements before your procedure.    If you have any questions regarding your pre-procedure instructions,  Please call Pre-admit Testing at [phone].

## 2020-09-22 NOTE — Telephone Encounter (Signed)
Patient notified and voiced understanding.

## 2020-09-22 NOTE — Telephone Encounter (Signed)
-----   Message from Harle Battiest, PA-C sent at 09/21/2020  8:35 PM EDT ----- Please let Mr. Twaddle know that his urine culture had very little bacteria, but to finish the Cipro as he is scheduled for sugery.

## 2020-09-29 ENCOUNTER — Other Ambulatory Visit
Admission: RE | Admit: 2020-09-29 | Discharge: 2020-09-29 | Disposition: A | Discharge status: Home / Self Care | Payer: Medicare HMO | Source: Ambulatory Visit | Attending: Surgery | Admitting: Surgery

## 2020-09-29 ENCOUNTER — Other Ambulatory Visit: Payer: Self-pay

## 2020-09-29 DIAGNOSIS — K6389 Other specified diseases of intestine: Secondary | ICD-10-CM | POA: Diagnosis not present

## 2020-09-29 DIAGNOSIS — B952 Enterococcus as the cause of diseases classified elsewhere: Secondary | ICD-10-CM | POA: Diagnosis not present

## 2020-09-29 DIAGNOSIS — K9189 Other postprocedural complications and disorders of digestive system: Secondary | ICD-10-CM | POA: Diagnosis not present

## 2020-09-29 DIAGNOSIS — K5720 Diverticulitis of large intestine with perforation and abscess without bleeding: Secondary | ICD-10-CM | POA: Diagnosis not present

## 2020-09-29 DIAGNOSIS — F418 Other specified anxiety disorders: Secondary | ICD-10-CM | POA: Diagnosis not present

## 2020-09-29 DIAGNOSIS — G894 Chronic pain syndrome: Secondary | ICD-10-CM | POA: Diagnosis present

## 2020-09-29 DIAGNOSIS — Z1611 Resistance to penicillins: Secondary | ICD-10-CM | POA: Diagnosis not present

## 2020-09-29 DIAGNOSIS — Z01812 Encounter for preprocedural laboratory examination: Secondary | ICD-10-CM | POA: Insufficient documentation

## 2020-09-29 DIAGNOSIS — K632 Fistula of intestine: Secondary | ICD-10-CM | POA: Diagnosis not present

## 2020-09-29 DIAGNOSIS — K5732 Diverticulitis of large intestine without perforation or abscess without bleeding: Secondary | ICD-10-CM | POA: Diagnosis present

## 2020-09-29 DIAGNOSIS — E878 Other disorders of electrolyte and fluid balance, not elsewhere classified: Secondary | ICD-10-CM | POA: Diagnosis not present

## 2020-09-29 DIAGNOSIS — K660 Peritoneal adhesions (postprocedural) (postinfection): Secondary | ICD-10-CM | POA: Diagnosis not present

## 2020-09-29 DIAGNOSIS — T8149XA Infection following a procedure, other surgical site, initial encounter: Secondary | ICD-10-CM | POA: Diagnosis not present

## 2020-09-29 DIAGNOSIS — G4733 Obstructive sleep apnea (adult) (pediatric): Secondary | ICD-10-CM | POA: Diagnosis present

## 2020-09-29 DIAGNOSIS — T8189XA Other complications of procedures, not elsewhere classified, initial encounter: Secondary | ICD-10-CM | POA: Diagnosis not present

## 2020-09-29 DIAGNOSIS — N321 Vesicointestinal fistula: Secondary | ICD-10-CM | POA: Diagnosis present

## 2020-09-29 DIAGNOSIS — K567 Ileus, unspecified: Secondary | ICD-10-CM | POA: Diagnosis not present

## 2020-09-29 DIAGNOSIS — Y832 Surgical operation with anastomosis, bypass or graft as the cause of abnormal reaction of the patient, or of later complication, without mention of misadventure at the time of the procedure: Secondary | ICD-10-CM | POA: Diagnosis not present

## 2020-09-29 DIAGNOSIS — J45909 Unspecified asthma, uncomplicated: Secondary | ICD-10-CM | POA: Diagnosis present

## 2020-09-29 DIAGNOSIS — E785 Hyperlipidemia, unspecified: Secondary | ICD-10-CM | POA: Diagnosis not present

## 2020-09-29 DIAGNOSIS — Z888 Allergy status to other drugs, medicaments and biological substances status: Secondary | ICD-10-CM | POA: Diagnosis not present

## 2020-09-29 DIAGNOSIS — T797XXA Traumatic subcutaneous emphysema, initial encounter: Secondary | ICD-10-CM | POA: Diagnosis not present

## 2020-09-29 DIAGNOSIS — T8182XA Emphysema (subcutaneous) resulting from a procedure, initial encounter: Secondary | ICD-10-CM | POA: Diagnosis not present

## 2020-09-29 DIAGNOSIS — U071 COVID-19: Secondary | ICD-10-CM | POA: Diagnosis present

## 2020-09-29 DIAGNOSIS — R188 Other ascites: Secondary | ICD-10-CM | POA: Diagnosis not present

## 2020-09-29 DIAGNOSIS — Z23 Encounter for immunization: Secondary | ICD-10-CM | POA: Diagnosis not present

## 2020-09-29 DIAGNOSIS — E876 Hypokalemia: Secondary | ICD-10-CM | POA: Diagnosis present

## 2020-09-29 DIAGNOSIS — T8143XA Infection following a procedure, organ and space surgical site, initial encounter: Secondary | ICD-10-CM | POA: Diagnosis not present

## 2020-09-29 DIAGNOSIS — I10 Essential (primary) hypertension: Secondary | ICD-10-CM | POA: Diagnosis present

## 2020-09-29 DIAGNOSIS — Z8719 Personal history of other diseases of the digestive system: Secondary | ICD-10-CM | POA: Diagnosis not present

## 2020-09-29 DIAGNOSIS — E871 Hypo-osmolality and hyponatremia: Secondary | ICD-10-CM | POA: Diagnosis not present

## 2020-09-29 DIAGNOSIS — Z20822 Contact with and (suspected) exposure to covid-19: Secondary | ICD-10-CM | POA: Insufficient documentation

## 2020-09-29 DIAGNOSIS — T8132XA Disruption of internal operation (surgical) wound, not elsewhere classified, initial encounter: Secondary | ICD-10-CM | POA: Diagnosis not present

## 2020-09-29 DIAGNOSIS — K6811 Postprocedural retroperitoneal abscess: Secondary | ICD-10-CM | POA: Diagnosis not present

## 2020-09-29 DIAGNOSIS — Z9049 Acquired absence of other specified parts of digestive tract: Secondary | ICD-10-CM | POA: Diagnosis not present

## 2020-09-29 DIAGNOSIS — Z408 Encounter for other prophylactic surgery: Secondary | ICD-10-CM | POA: Diagnosis not present

## 2020-09-29 DIAGNOSIS — B9620 Unspecified Escherichia coli [E. coli] as the cause of diseases classified elsewhere: Secondary | ICD-10-CM | POA: Diagnosis not present

## 2020-09-29 DIAGNOSIS — T8141XA Infection following a procedure, superficial incisional surgical site, initial encounter: Secondary | ICD-10-CM | POA: Diagnosis not present

## 2020-09-29 DIAGNOSIS — K529 Noninfective gastroenteritis and colitis, unspecified: Secondary | ICD-10-CM | POA: Diagnosis not present

## 2020-09-29 DIAGNOSIS — Z6841 Body Mass Index (BMI) 40.0 and over, adult: Secondary | ICD-10-CM | POA: Diagnosis not present

## 2020-09-29 DIAGNOSIS — N433 Hydrocele, unspecified: Secondary | ICD-10-CM | POA: Diagnosis present

## 2020-09-29 LAB — TYPE AND SCREEN
ABO/RH(D): A POS
Antibody Screen: NEGATIVE

## 2020-09-29 LAB — CBC WITH DIFFERENTIAL/PLATELET
Abs Immature Granulocytes: 0.06 10*3/uL (ref 0.00–0.07)
Basophils Absolute: 0.1 10*3/uL (ref 0.0–0.1)
Basophils Relative: 1 %
Eosinophils Absolute: 0.2 10*3/uL (ref 0.0–0.5)
Eosinophils Relative: 3 %
HCT: 43.6 % (ref 39.0–52.0)
Hemoglobin: 13.7 g/dL (ref 13.0–17.0)
Immature Granulocytes: 1 %
Lymphocytes Relative: 34 %
Lymphs Abs: 2.4 10*3/uL (ref 0.7–4.0)
MCH: 26 pg (ref 26.0–34.0)
MCHC: 31.4 g/dL (ref 30.0–36.0)
MCV: 82.9 fL (ref 80.0–100.0)
Monocytes Absolute: 0.7 10*3/uL (ref 0.1–1.0)
Monocytes Relative: 9 %
Neutro Abs: 3.8 10*3/uL (ref 1.7–7.7)
Neutrophils Relative %: 52 %
Platelets: 295 10*3/uL (ref 150–400)
RBC: 5.26 MIL/uL (ref 4.22–5.81)
RDW: 14.6 % (ref 11.5–15.5)
WBC: 7.2 10*3/uL (ref 4.0–10.5)
nRBC: 0 % (ref 0.0–0.2)

## 2020-09-29 LAB — COMPREHENSIVE METABOLIC PANEL
ALT: 15 U/L (ref 0–44)
AST: 19 U/L (ref 15–41)
Albumin: 3.7 g/dL (ref 3.5–5.0)
Alkaline Phosphatase: 74 U/L (ref 38–126)
Anion gap: 11 (ref 5–15)
BUN: 8 mg/dL (ref 6–20)
CO2: 25 mmol/L (ref 22–32)
Calcium: 8.8 mg/dL — ABNORMAL LOW (ref 8.9–10.3)
Chloride: 104 mmol/L (ref 98–111)
Creatinine, Ser: 0.72 mg/dL (ref 0.61–1.24)
GFR calc Af Amer: >60 mL/min (ref >60)
GFR calc non Af Amer: >60 mL/min (ref >60)
Glucose, Bld: 104 mg/dL — ABNORMAL HIGH (ref 70–99)
Potassium: 3.3 mmol/L — ABNORMAL LOW (ref 3.5–5.1)
Sodium: 140 mmol/L (ref 135–145)
Total Bilirubin: 0.7 mg/dL (ref 0.3–1.2)
Total Protein: 7 g/dL (ref 6.5–8.1)

## 2020-09-29 LAB — SARS CORONAVIRUS 2 (TAT 6-24 HRS): SARS Coronavirus 2: NEGATIVE

## 2020-09-30 ENCOUNTER — Ambulatory Visit (HOSPITAL_BASED_OUTPATIENT_CLINIC_OR_DEPARTMENT_OTHER)
Admission: RE | Admit: 2020-09-30 | Discharge: 2020-09-30 | Disposition: A | Discharge status: Home / Self Care | Payer: Medicare HMO | Source: Home / Self Care | Attending: Gastroenterology | Admitting: Gastroenterology

## 2020-09-30 ENCOUNTER — Encounter: Payer: Self-pay | Admitting: Gastroenterology

## 2020-09-30 ENCOUNTER — Ambulatory Visit: Payer: Medicare HMO | Admitting: Anesthesiology

## 2020-09-30 ENCOUNTER — Other Ambulatory Visit: Payer: Self-pay

## 2020-09-30 ENCOUNTER — Encounter
Admission: RE | Disposition: A | Discharge status: Home / Self Care | Payer: Self-pay | Source: Home / Self Care | Attending: Gastroenterology

## 2020-09-30 DIAGNOSIS — Z885 Allergy status to narcotic agent status: Secondary | ICD-10-CM | POA: Insufficient documentation

## 2020-09-30 DIAGNOSIS — Z882 Allergy status to sulfonamides status: Secondary | ICD-10-CM | POA: Insufficient documentation

## 2020-09-30 DIAGNOSIS — K5732 Diverticulitis of large intestine without perforation or abscess without bleeding: Secondary | ICD-10-CM | POA: Diagnosis not present

## 2020-09-30 DIAGNOSIS — N321 Vesicointestinal fistula: Secondary | ICD-10-CM

## 2020-09-30 DIAGNOSIS — Z79899 Other long term (current) drug therapy: Secondary | ICD-10-CM | POA: Insufficient documentation

## 2020-09-30 DIAGNOSIS — Z09 Encounter for follow-up examination after completed treatment for conditions other than malignant neoplasm: Secondary | ICD-10-CM | POA: Insufficient documentation

## 2020-09-30 DIAGNOSIS — G4733 Obstructive sleep apnea (adult) (pediatric): Secondary | ICD-10-CM | POA: Diagnosis not present

## 2020-09-30 DIAGNOSIS — K5730 Diverticulosis of large intestine without perforation or abscess without bleeding: Secondary | ICD-10-CM | POA: Insufficient documentation

## 2020-09-30 DIAGNOSIS — G4730 Sleep apnea, unspecified: Secondary | ICD-10-CM | POA: Insufficient documentation

## 2020-09-30 DIAGNOSIS — Z7982 Long term (current) use of aspirin: Secondary | ICD-10-CM | POA: Insufficient documentation

## 2020-09-30 DIAGNOSIS — Z87891 Personal history of nicotine dependence: Secondary | ICD-10-CM | POA: Insufficient documentation

## 2020-09-30 DIAGNOSIS — I10 Essential (primary) hypertension: Secondary | ICD-10-CM | POA: Diagnosis not present

## 2020-09-30 DIAGNOSIS — G894 Chronic pain syndrome: Secondary | ICD-10-CM | POA: Insufficient documentation

## 2020-09-30 DIAGNOSIS — E785 Hyperlipidemia, unspecified: Secondary | ICD-10-CM | POA: Diagnosis not present

## 2020-09-30 DIAGNOSIS — Z96651 Presence of right artificial knee joint: Secondary | ICD-10-CM | POA: Insufficient documentation

## 2020-09-30 DIAGNOSIS — Z87440 Personal history of urinary (tract) infections: Secondary | ICD-10-CM | POA: Insufficient documentation

## 2020-09-30 HISTORY — PX: COLONOSCOPY WITH PROPOFOL: SHX5780

## 2020-09-30 SURGERY — COLONOSCOPY WITH PROPOFOL
Anesthesia: General

## 2020-09-30 MED ORDER — FENTANYL CITRATE (PF) 100 MCG/2ML IJ SOLN
INTRAMUSCULAR | Status: DC | PRN
Start: 2020-09-30 — End: 2020-09-30
  Administered 2020-09-30: 25 ug via INTRAVENOUS
  Administered 2020-09-30: 50 ug via INTRAVENOUS
  Administered 2020-09-30: 25 ug via INTRAVENOUS

## 2020-09-30 MED ORDER — PROPOFOL 500 MG/50ML IV EMUL
INTRAVENOUS | Status: DC | PRN
  Administered 2020-09-30: 150 ug/kg/min via INTRAVENOUS

## 2020-09-30 MED ORDER — FENTANYL CITRATE (PF) 100 MCG/2ML IJ SOLN
INTRAMUSCULAR | Status: AC
  Filled 2020-09-30: qty 2

## 2020-09-30 MED ORDER — SODIUM CHLORIDE 0.9 % IV SOLN: INTRAVENOUS | Status: DC

## 2020-09-30 MED ORDER — LIDOCAINE HCL (CARDIAC) PF 100 MG/5ML IV SOSY
PREFILLED_SYRINGE | INTRAVENOUS | Status: DC | PRN
  Administered 2020-09-30: 50 mg via INTRAVENOUS

## 2020-09-30 MED ORDER — PROPOFOL 10 MG/ML IV BOLUS
INTRAVENOUS | Status: DC | PRN
  Administered 2020-09-30: 50 mg via INTRAVENOUS
  Administered 2020-09-30: 30 mg via INTRAVENOUS
  Administered 2020-09-30: 20 mg via INTRAVENOUS

## 2020-09-30 NOTE — Transfer of Care (Signed)
Immediate Anesthesia Transfer of Care Note  Patient: Jordan [name]  Procedure(s) Performed: COLONOSCOPY WITH PROPOFOL (N/A )  Patient Location: PACU and Endoscopy Unit  Anesthesia Type:General  Level of Consciousness: awake, alert , oriented and patient cooperative  Airway & Oxygen Therapy: Patient Spontanous Breathing  Post-op Assessment: Report given to RN and Post -op Vital signs reviewed and stable  Post vital signs: Reviewed and stable  Last Vitals:  Vitals Value Taken Time  BP    Temp    Pulse 83 09/30/20 1105  Resp 14 09/30/20 1105  SpO2 99 % 09/30/20 1105    Last Pain:  Vitals:   09/30/20 1018  TempSrc: Temporal  PainSc: 0-No pain         Complications: No complications documented.

## 2020-09-30 NOTE — Interval H&P Note (Signed)
History and Physical Interval Note:  09/30/2020 10:06 AM  Jordan [name]  has presented today for surgery, with the diagnosis of Colovesical Fistula N32.1.  The various methods of treatment have been discussed with the patient and family. After consideration of risks, benefits and other options for treatment, the patient has consented to  Procedure(s): COLONOSCOPY WITH PROPOFOL (N/A) as a surgical intervention.  The patient's history has been reviewed, patient examined, no change in status, stable for surgery.  I have reviewed the patient's chart and labs.  Questions were answered to the patient's satisfaction.     Hue Frick [employer]

## 2020-09-30 NOTE — Anesthesia Preprocedure Evaluation (Signed)
Anesthesia Evaluation  Patient identified by MRN, date of birth, ID band Patient awake    Reviewed: Allergy & Precautions, H&P , NPO status , Patient's Chart, lab work & pertinent test results  History of Anesthesia Complications Negative for: history of anesthetic complications  Airway Mallampati: III  TM Distance: <3 FB Neck ROM: full    Dental  (+) Chipped   Pulmonary asthma , sleep apnea and Continuous Positive Airway Pressure Ventilation , former smoker,    Pulmonary exam normal        Cardiovascular Exercise Tolerance: Good hypertension, (-) angina(-) Past MI and (-) DOE Normal cardiovascular exam     Neuro/Psych PSYCHIATRIC DISORDERS negative neurological ROS     GI/Hepatic negative GI ROS, Neg liver ROS, neg GERD  ,  Endo/Other  negative endocrine ROS  Renal/GU negative Renal ROS  negative genitourinary   Musculoskeletal  (+) Arthritis ,   Abdominal   Peds  Hematology negative hematology ROS (+)   Anesthesia Other Findings Past Medical History: No date: Anxiety and depression No date: Asthma No date: Chronic pain syndrome No date: History of kidney stones No date: HYPERTENSION, MILD No date: OSTEOARTHRITIS No date: Recurrent UTI No date: Sleep apnea  Past Surgical History: No date: JOINT REPLACEMENT No date: KNEE SURGERY; Right     Comment:  7 total surgeries No date: REPLACEMENT TOTAL KNEE; Right     Reproductive/Obstetrics negative OB ROS                             Anesthesia Physical Anesthesia Plan  ASA: III  Anesthesia Plan: General   Post-op Pain Management:    Induction: Intravenous  PONV Risk Score and Plan: Propofol infusion and TIVA  Airway Management Planned: Natural Airway and Nasal Cannula  Additional Equipment:   Intra-op Plan:   Post-operative Plan:   Informed Consent: I have reviewed the patients History and Physical, chart, labs and  discussed the procedure including the risks, benefits and alternatives for the proposed anesthesia with the patient or authorized representative who has indicated his/her understanding and acceptance.     Dental Advisory Given  Plan Discussed with: Anesthesiologist, CRNA and Surgeon  Anesthesia Plan Comments: (Patient consented for risks of anesthesia including but not limited to:  - adverse reactions to medications - risk of intubation if required - damage to eyes, teeth, lips or other oral mucosa - nerve damage due to positioning  - sore throat or hoarseness - Damage to heart, brain, nerves, lungs, other parts of body or loss of life  Patient voiced understanding.)        Anesthesia Quick Evaluation

## 2020-09-30 NOTE — Op Note (Signed)
[facility] Patient Name: Jordan [name] Procedure Date: 09/30/2020 10:39 AM MRN: [id number] Account #: [id number] Date of Birth: 19-Aug-1981 Admit Type: Outpatient Age: 39 Room: [facility] ENDO ROOM 4 Gender: Male Note Status: Finalized Procedure:             Colonoscopy Indications:           Follow-up of diverticulitis Providers:             Midge Minium MD, MD Referring MD:          Mayer Masker (Referring MD) Medicines:             Propofol per Anesthesia Complications:         No immediate complications. Procedure:             Pre-Anesthesia Assessment:                        - Prior to the procedure, a History and Physical was                         performed, and patient medications and allergies were                         reviewed. The patient's tolerance of previous                         anesthesia was also reviewed. The risks and benefits                         of the procedure and the sedation options and risks                         were discussed with the patient. All questions were                         answered, and informed consent was obtained. Prior                         Anticoagulants: The patient has taken no previous                         anticoagulant or antiplatelet agents. ASA Grade                         Assessment: II - A patient with mild systemic disease.                         After reviewing the risks and benefits, the patient                         was deemed in satisfactory condition to undergo the                         procedure.                        After obtaining informed consent, the colonoscope was  passed under direct vision. Throughout the procedure,                         the patient's blood pressure, pulse, and oxygen                         saturations were monitored continuously. The                         Colonoscope was introduced through the anus and                          advanced to the the cecum, identified by appendiceal                         orifice and ileocecal valve. The colonoscopy was                         performed without difficulty. The patient tolerated                         the procedure well. The quality of the bowel                         preparation was excellent. Findings:      The perianal and digital rectal examinations were normal.      A few small-mouthed diverticula were found in the entire colon. Impression:            - Diverticulosis in the entire examined colon.                        - No specimens collected. Recommendation:        - Discharge patient to home.                        - Resume previous diet.                        - Continue present medications. Procedure Code(s):     --- Professional ---                        [id number], Colonoscopy, flexible; diagnostic, including                         collection of specimen(s) by brushing or washing, when                         performed (separate procedure) Diagnosis Code(s):     --- Professional ---                        S96.28, Diverticulitis of large intestine without                         perforation or abscess without bleeding CPT copyright 2019 American Medical Association. All rights reserved. The codes documented in this report are preliminary and upon coder review may  be revised to meet current compliance requirements. Midge Minium MD, MD 09/30/2020 10:59:57 AM This report has been signed electronically. Number  of Addenda: 0 Note Initiated On: 09/30/2020 10:39 AM Scope Withdrawal Time: 0 hours 6 minutes 17 seconds  Total Procedure Duration: 0 hours 8 minutes 46 seconds  Estimated Blood Loss:  Estimated blood loss: none.      [facility]

## 2020-09-30 NOTE — Anesthesia Postprocedure Evaluation (Signed)
Anesthesia Post Note  Patient: Tan Clopper Sadler III  Procedure(s) Performed: COLONOSCOPY WITH PROPOFOL (N/A )  Patient location during evaluation: Endoscopy Anesthesia Type: General Level of consciousness: awake and alert Pain management: pain level controlled Vital Signs Assessment: post-procedure vital signs reviewed and stable Respiratory status: spontaneous breathing, nonlabored ventilation, respiratory function stable and patient connected to nasal cannula oxygen Cardiovascular status: blood pressure returned to baseline and stable Postop Assessment: no apparent nausea or vomiting Anesthetic complications: no   No complications documented.   Last Vitals:  Vitals:   09/30/20 1124 09/30/20 1134  BP: 138/76 134/77  Pulse:    Resp:  (!) 23  Temp:    SpO2:      Last Pain:  Vitals:   09/30/20 1134  TempSrc:   PainSc: 0-No pain                 Cleda Mccreedy Lukus Binion

## 2020-10-01 ENCOUNTER — Inpatient Hospital Stay
Admission: RE | Admit: 2020-10-01 | Discharge: 2020-10-24 | DRG: 673 | Disposition: A | Discharge status: Home Health | Payer: Medicare HMO | Attending: Surgery | Admitting: Surgery

## 2020-10-01 ENCOUNTER — Inpatient Hospital Stay: Payer: Medicare HMO | Admitting: Urgent Care

## 2020-10-01 ENCOUNTER — Encounter
Admission: RE | Disposition: A | Discharge status: Home Health | Payer: Self-pay | Source: Home / Self Care | Attending: Surgery

## 2020-10-01 ENCOUNTER — Encounter: Payer: Self-pay | Admitting: Surgery

## 2020-10-01 ENCOUNTER — Other Ambulatory Visit: Payer: Self-pay

## 2020-10-01 DIAGNOSIS — E871 Hypo-osmolality and hyponatremia: Secondary | ICD-10-CM | POA: Diagnosis not present

## 2020-10-01 DIAGNOSIS — E876 Hypokalemia: Secondary | ICD-10-CM | POA: Diagnosis present

## 2020-10-01 DIAGNOSIS — K567 Ileus, unspecified: Secondary | ICD-10-CM | POA: Diagnosis not present

## 2020-10-01 DIAGNOSIS — Z882 Allergy status to sulfonamides status: Secondary | ICD-10-CM

## 2020-10-01 DIAGNOSIS — Z8719 Personal history of other diseases of the digestive system: Secondary | ICD-10-CM | POA: Diagnosis not present

## 2020-10-01 DIAGNOSIS — K632 Fistula of intestine: Secondary | ICD-10-CM | POA: Diagnosis not present

## 2020-10-01 DIAGNOSIS — T8132XA Disruption of internal operation (surgical) wound, not elsewhere classified, initial encounter: Secondary | ICD-10-CM | POA: Diagnosis not present

## 2020-10-01 DIAGNOSIS — R188 Other ascites: Secondary | ICD-10-CM | POA: Diagnosis not present

## 2020-10-01 DIAGNOSIS — J45909 Unspecified asthma, uncomplicated: Secondary | ICD-10-CM | POA: Diagnosis present

## 2020-10-01 DIAGNOSIS — Z79899 Other long term (current) drug therapy: Secondary | ICD-10-CM

## 2020-10-01 DIAGNOSIS — Z408 Encounter for other prophylactic surgery: Secondary | ICD-10-CM | POA: Diagnosis not present

## 2020-10-01 DIAGNOSIS — B952 Enterococcus as the cause of diseases classified elsewhere: Secondary | ICD-10-CM | POA: Diagnosis not present

## 2020-10-01 DIAGNOSIS — G4733 Obstructive sleep apnea (adult) (pediatric): Secondary | ICD-10-CM | POA: Diagnosis present

## 2020-10-01 DIAGNOSIS — U071 COVID-19: Secondary | ICD-10-CM | POA: Diagnosis present

## 2020-10-01 DIAGNOSIS — T8141XA Infection following a procedure, superficial incisional surgical site, initial encounter: Secondary | ICD-10-CM | POA: Diagnosis not present

## 2020-10-01 DIAGNOSIS — Y832 Surgical operation with anastomosis, bypass or graft as the cause of abnormal reaction of the patient, or of later complication, without mention of misadventure at the time of the procedure: Secondary | ICD-10-CM | POA: Diagnosis not present

## 2020-10-01 DIAGNOSIS — Z96651 Presence of right artificial knee joint: Secondary | ICD-10-CM | POA: Diagnosis present

## 2020-10-01 DIAGNOSIS — K660 Peritoneal adhesions (postprocedural) (postinfection): Secondary | ICD-10-CM | POA: Diagnosis not present

## 2020-10-01 DIAGNOSIS — Z9049 Acquired absence of other specified parts of digestive tract: Secondary | ICD-10-CM | POA: Diagnosis not present

## 2020-10-01 DIAGNOSIS — Z23 Encounter for immunization: Secondary | ICD-10-CM | POA: Diagnosis not present

## 2020-10-01 DIAGNOSIS — N321 Vesicointestinal fistula: Principal | ICD-10-CM | POA: Diagnosis present

## 2020-10-01 DIAGNOSIS — K9189 Other postprocedural complications and disorders of digestive system: Secondary | ICD-10-CM | POA: Diagnosis not present

## 2020-10-01 DIAGNOSIS — N433 Hydrocele, unspecified: Secondary | ICD-10-CM | POA: Diagnosis present

## 2020-10-01 DIAGNOSIS — Z888 Allergy status to other drugs, medicaments and biological substances status: Secondary | ICD-10-CM

## 2020-10-01 DIAGNOSIS — Z87891 Personal history of nicotine dependence: Secondary | ICD-10-CM

## 2020-10-01 DIAGNOSIS — K5732 Diverticulitis of large intestine without perforation or abscess without bleeding: Secondary | ICD-10-CM | POA: Diagnosis present

## 2020-10-01 DIAGNOSIS — K6389 Other specified diseases of intestine: Secondary | ICD-10-CM | POA: Diagnosis not present

## 2020-10-01 DIAGNOSIS — K5720 Diverticulitis of large intestine with perforation and abscess without bleeding: Secondary | ICD-10-CM | POA: Diagnosis not present

## 2020-10-01 DIAGNOSIS — Z6841 Body Mass Index (BMI) 40.0 and over, adult: Secondary | ICD-10-CM

## 2020-10-01 DIAGNOSIS — T8143XA Infection following a procedure, organ and space surgical site, initial encounter: Secondary | ICD-10-CM | POA: Diagnosis not present

## 2020-10-01 DIAGNOSIS — Z833 Family history of diabetes mellitus: Secondary | ICD-10-CM

## 2020-10-01 DIAGNOSIS — Z1611 Resistance to penicillins: Secondary | ICD-10-CM | POA: Diagnosis not present

## 2020-10-01 DIAGNOSIS — K529 Noninfective gastroenteritis and colitis, unspecified: Secondary | ICD-10-CM | POA: Diagnosis not present

## 2020-10-01 DIAGNOSIS — Z83438 Family history of other disorder of lipoprotein metabolism and other lipidemia: Secondary | ICD-10-CM

## 2020-10-01 DIAGNOSIS — E878 Other disorders of electrolyte and fluid balance, not elsewhere classified: Secondary | ICD-10-CM | POA: Diagnosis not present

## 2020-10-01 DIAGNOSIS — T8189XA Other complications of procedures, not elsewhere classified, initial encounter: Secondary | ICD-10-CM | POA: Diagnosis not present

## 2020-10-01 DIAGNOSIS — B9620 Unspecified Escherichia coli [E. coli] as the cause of diseases classified elsewhere: Secondary | ICD-10-CM | POA: Diagnosis not present

## 2020-10-01 DIAGNOSIS — I861 Scrotal varices: Secondary | ICD-10-CM | POA: Diagnosis present

## 2020-10-01 DIAGNOSIS — Z801 Family history of malignant neoplasm of trachea, bronchus and lung: Secondary | ICD-10-CM

## 2020-10-01 DIAGNOSIS — K651 Peritoneal abscess: Secondary | ICD-10-CM

## 2020-10-01 DIAGNOSIS — Z7982 Long term (current) use of aspirin: Secondary | ICD-10-CM

## 2020-10-01 DIAGNOSIS — E739 Lactose intolerance, unspecified: Secondary | ICD-10-CM | POA: Diagnosis present

## 2020-10-01 DIAGNOSIS — F418 Other specified anxiety disorders: Secondary | ICD-10-CM | POA: Diagnosis not present

## 2020-10-01 DIAGNOSIS — Z87440 Personal history of urinary (tract) infections: Secondary | ICD-10-CM

## 2020-10-01 DIAGNOSIS — I10 Essential (primary) hypertension: Secondary | ICD-10-CM | POA: Diagnosis present

## 2020-10-01 DIAGNOSIS — G894 Chronic pain syndrome: Secondary | ICD-10-CM | POA: Diagnosis present

## 2020-10-01 DIAGNOSIS — T797XXA Traumatic subcutaneous emphysema, initial encounter: Secondary | ICD-10-CM | POA: Diagnosis not present

## 2020-10-01 DIAGNOSIS — Z8249 Family history of ischemic heart disease and other diseases of the circulatory system: Secondary | ICD-10-CM

## 2020-10-01 DIAGNOSIS — Z87442 Personal history of urinary calculi: Secondary | ICD-10-CM

## 2020-10-01 DIAGNOSIS — T8149XA Infection following a procedure, other surgical site, initial encounter: Secondary | ICD-10-CM | POA: Diagnosis not present

## 2020-10-01 DIAGNOSIS — K6811 Postprocedural retroperitoneal abscess: Secondary | ICD-10-CM | POA: Diagnosis not present

## 2020-10-01 DIAGNOSIS — T8182XA Emphysema (subcutaneous) resulting from a procedure, initial encounter: Secondary | ICD-10-CM | POA: Diagnosis not present

## 2020-10-01 LAB — CBC
HCT: 43.3 % (ref 39.0–52.0)
HCT: 42.7 % (ref 39.0–52.0)
Hemoglobin: 14 g/dL (ref 13.0–17.0)
Hemoglobin: 13.6 g/dL (ref 13.0–17.0)
MCH: 26.6 pg (ref 26.0–34.0)
MCH: 26.4 pg (ref 26.0–34.0)
MCHC: 32.3 g/dL (ref 30.0–36.0)
MCHC: 31.9 g/dL (ref 30.0–36.0)
MCV: 82.3 fL (ref 80.0–100.0)
MCV: 82.9 fL (ref 80.0–100.0)
Platelets: 293 10*3/uL (ref 150–400)
Platelets: 306 10*3/uL (ref 150–400)
RBC: 5.26 MIL/uL (ref 4.22–5.81)
RBC: 5.15 MIL/uL (ref 4.22–5.81)
RDW: 15.2 % (ref 11.5–15.5)
RDW: 14.8 % (ref 11.5–15.5)
WBC: 6 10*3/uL (ref 4.0–10.5)
WBC: 14.4 10*3/uL — ABNORMAL HIGH (ref 4.0–10.5)
nRBC: 0 % (ref 0.0–0.2)
nRBC: 0 % (ref 0.0–0.2)

## 2020-10-01 LAB — URINE DRUG SCREEN, QUALITATIVE (ARMC ONLY)
Amphetamines, Ur Screen: NOT DETECTED
Barbiturates, Ur Screen: NOT DETECTED
Benzodiazepine, Ur Scrn: NOT DETECTED
Cannabinoid 50 Ng, Ur ~~LOC~~: POSITIVE — AB
Cocaine Metabolite,Ur ~~LOC~~: NOT DETECTED
MDMA (Ecstasy)Ur Screen: NOT DETECTED
Methadone Scn, Ur: NOT DETECTED
Opiate, Ur Screen: NOT DETECTED
Phencyclidine (PCP) Ur S: NOT DETECTED
Tricyclic, Ur Screen: NOT DETECTED

## 2020-10-01 LAB — BASIC METABOLIC PANEL
Anion gap: 8 (ref 5–15)
BUN: 5 mg/dL — ABNORMAL LOW (ref 6–20)
CO2: 23 mmol/L (ref 22–32)
Calcium: 8.1 mg/dL — ABNORMAL LOW (ref 8.9–10.3)
Chloride: 108 mmol/L (ref 98–111)
Creatinine, Ser: 1 mg/dL (ref 0.61–1.24)
GFR calc non Af Amer: >60 mL/min (ref >60)
Glucose, Bld: 151 mg/dL — ABNORMAL HIGH (ref 70–99)
Potassium: 3.9 mmol/L (ref 3.5–5.1)
Sodium: 139 mmol/L (ref 135–145)

## 2020-10-01 LAB — SARS CORONAVIRUS 2 BY RT PCR (HOSPITAL ORDER, PERFORMED IN ~~LOC~~ HOSPITAL LAB): SARS Coronavirus 2: POSITIVE — AB

## 2020-10-01 LAB — ABO/RH: ABO/RH(D): A POS

## 2020-10-01 LAB — CREATININE, SERUM
Creatinine, Ser: 0.6 mg/dL — ABNORMAL LOW (ref 0.61–1.24)
GFR calc non Af Amer: >60 mL/min (ref >60)

## 2020-10-01 SURGERY — COLECTOMY, SIGMOID, ROBOT-ASSISTED
Anesthesia: General

## 2020-10-01 MED ORDER — BUPIVACAINE-EPINEPHRINE (PF) 0.25% -1:200000 IJ SOLN
INTRAMUSCULAR | Status: AC
  Filled 2020-10-01: qty 30

## 2020-10-01 MED ORDER — INDOCYANINE GREEN 25 MG IV SOLR
INTRAVENOUS | Status: DC | PRN
  Administered 2020-10-01: 20 mg

## 2020-10-01 MED ORDER — HYDROMORPHONE HCL 1 MG/ML IJ SOLN
INTRAMUSCULAR | Status: AC
  Filled 2020-10-01: qty 1

## 2020-10-01 MED ORDER — FENTANYL CITRATE (PF) 100 MCG/2ML IJ SOLN
INTRAMUSCULAR | Status: DC | PRN
Start: 2020-10-01 — End: 2020-10-02
  Administered 2020-10-01 (×4): 50 ug via INTRAVENOUS

## 2020-10-01 MED ORDER — ALVIMOPAN 12 MG PO CAPS
12.0000 mg | ORAL_CAPSULE | ORAL | Status: AC
  Administered 2020-10-01: 12 mg via ORAL

## 2020-10-01 MED ORDER — CHLORHEXIDINE GLUCONATE CLOTH 2 % EX PADS
6.0000 | MEDICATED_PAD | Freq: Once | CUTANEOUS | Status: AC
  Administered 2020-10-01: 6 via TOPICAL

## 2020-10-01 MED ORDER — FENTANYL CITRATE (PF) 100 MCG/2ML IJ SOLN
INTRAMUSCULAR | Status: AC
  Filled 2020-10-01: qty 2

## 2020-10-01 MED ORDER — ALVIMOPAN 12 MG PO CAPS
12.0000 mg | ORAL_CAPSULE | Freq: Two times a day (BID) | ORAL | Status: DC
  Administered 2020-10-02 – 2020-10-03 (×3): 12 mg via ORAL
  Filled 2020-10-01 (×4): qty 1

## 2020-10-01 MED ORDER — KETOROLAC TROMETHAMINE 30 MG/ML IJ SOLN
INTRAMUSCULAR | Status: DC | PRN
  Administered 2020-10-01: 15 mg via INTRAVENOUS

## 2020-10-01 MED ORDER — ONDANSETRON 4 MG PO TBDP
4.0000 mg | ORAL_TABLET | Freq: Four times a day (QID) | ORAL | Status: DC | PRN
  Administered 2020-10-17 – 2020-10-19 (×2): 4 mg via ORAL
  Filled 2020-10-01 (×3): qty 1

## 2020-10-01 MED ORDER — BUPIVACAINE LIPOSOME 1.3 % IJ SUSP: 20.0000 mL | Freq: Once | INTRAMUSCULAR | Status: DC

## 2020-10-01 MED ORDER — FAMOTIDINE 20 MG PO TABS
ORAL_TABLET | ORAL | Status: AC
  Filled 2020-10-01: qty 1

## 2020-10-01 MED ORDER — MORPHINE SULFATE (PF) 2 MG/ML IV SOLN
2.0000 mg | INTRAVENOUS | Status: DC | PRN
  Administered 2020-10-01 (×2): 2 mg via INTRAVENOUS
  Filled 2020-10-01 (×2): qty 1

## 2020-10-01 MED ORDER — DEXAMETHASONE SODIUM PHOSPHATE 10 MG/ML IJ SOLN
INTRAMUSCULAR | Status: DC | PRN
  Administered 2020-10-01: 10 mg via INTRAVENOUS

## 2020-10-01 MED ORDER — HYDROCODONE-ACETAMINOPHEN 5-325 MG PO TABS
1.0000 | ORAL_TABLET | ORAL | Status: DC | PRN
  Administered 2020-10-01 – 2020-10-03 (×7): 2 via ORAL
  Administered 2020-10-03 (×2): 1 via ORAL
  Administered 2020-10-04 (×2): 2 via ORAL
  Filled 2020-10-01 (×4): qty 2
  Filled 2020-10-01: qty 1
  Filled 2020-10-01 (×6): qty 2

## 2020-10-01 MED ORDER — SUCCINYLCHOLINE CHLORIDE 20 MG/ML IJ SOLN
INTRAMUSCULAR | Status: DC | PRN
  Administered 2020-10-01: 100 mg via INTRAVENOUS

## 2020-10-01 MED ORDER — LIDOCAINE HCL (CARDIAC) PF 100 MG/5ML IV SOSY
PREFILLED_SYRINGE | INTRAVENOUS | Status: DC | PRN
  Administered 2020-10-01: 100 mg via INTRAVENOUS

## 2020-10-01 MED ORDER — HEPARIN SODIUM (PORCINE) 5000 UNIT/ML IJ SOLN
5000.0000 [IU] | Freq: Three times a day (TID) | INTRAMUSCULAR | Status: DC
  Administered 2020-10-01 – 2020-10-24 (×63): 5000 [IU] via SUBCUTANEOUS
  Filled 2020-10-01 (×63): qty 1

## 2020-10-01 MED ORDER — ORAL CARE MOUTH RINSE: 15.0000 mL | Freq: Once | OROMUCOSAL | Status: AC

## 2020-10-01 MED ORDER — FENTANYL CITRATE (PF) 100 MCG/2ML IJ SOLN
INTRAMUSCULAR | Status: AC
  Administered 2020-10-01: 25 ug via INTRAVENOUS
  Filled 2020-10-01: qty 2

## 2020-10-01 MED ORDER — ALVIMOPAN 12 MG PO CAPS
ORAL_CAPSULE | ORAL | Status: AC
  Filled 2020-10-01: qty 1

## 2020-10-01 MED ORDER — BUPIVACAINE LIPOSOME 1.3 % IJ SUSP
INTRAMUSCULAR | Status: AC
  Filled 2020-10-01: qty 20

## 2020-10-01 MED ORDER — CHLORHEXIDINE GLUCONATE 0.12 % MT SOLN
15.0000 mL | Freq: Once | OROMUCOSAL | Status: AC
  Administered 2020-10-01: 15 mL via OROMUCOSAL

## 2020-10-01 MED ORDER — ACETAMINOPHEN 500 MG PO TABS
ORAL_TABLET | ORAL | Status: AC
  Filled 2020-10-01: qty 2

## 2020-10-01 MED ORDER — SPY AGENT GREEN - (INDOCYANINE FOR INJECTION)
INTRAMUSCULAR | Status: DC | PRN
  Administered 2020-10-01: 2.5 mL via INTRAVENOUS

## 2020-10-01 MED ORDER — HYDROMORPHONE HCL 1 MG/ML IJ SOLN
INTRAMUSCULAR | Status: DC | PRN
  Administered 2020-10-01: 1 mg via INTRAVENOUS

## 2020-10-01 MED ORDER — ACETAMINOPHEN 500 MG PO TABS
1000.0000 mg | ORAL_TABLET | ORAL | Status: AC
  Administered 2020-10-01: 1000 mg via ORAL

## 2020-10-01 MED ORDER — LACTATED RINGERS IV SOLN: INTRAVENOUS | Status: DC

## 2020-10-01 MED ORDER — PROPOFOL 10 MG/ML IV BOLUS
INTRAVENOUS | Status: AC
  Filled 2020-10-01: qty 20

## 2020-10-01 MED ORDER — ALBUTEROL SULFATE HFA 108 (90 BASE) MCG/ACT IN AERS
2.0000 | INHALATION_SPRAY | Freq: Four times a day (QID) | RESPIRATORY_TRACT | Status: DC | PRN
  Filled 2020-10-01: qty 6.7

## 2020-10-01 MED ORDER — ONDANSETRON HCL 4 MG/2ML IJ SOLN
INTRAMUSCULAR | Status: DC | PRN
  Administered 2020-10-01: 4 mg via INTRAVENOUS

## 2020-10-01 MED ORDER — SUGAMMADEX SODIUM 200 MG/2ML IV SOLN
INTRAVENOUS | Status: DC | PRN
  Administered 2020-10-01: 200 mg via INTRAVENOUS

## 2020-10-01 MED ORDER — GABAPENTIN 300 MG PO CAPS
ORAL_CAPSULE | ORAL | Status: AC
  Filled 2020-10-01: qty 1

## 2020-10-01 MED ORDER — GABAPENTIN 300 MG PO CAPS
300.0000 mg | ORAL_CAPSULE | Freq: Two times a day (BID) | ORAL | Status: DC
  Administered 2020-10-01 – 2020-10-16 (×27): 300 mg via ORAL
  Filled 2020-10-01 (×27): qty 1

## 2020-10-01 MED ORDER — CHLORHEXIDINE GLUCONATE CLOTH 2 % EX PADS
6.0000 | MEDICATED_PAD | Freq: Every day | CUTANEOUS | Status: DC
  Administered 2020-10-02 – 2020-10-05 (×4): 6 via TOPICAL

## 2020-10-01 MED ORDER — SODIUM CHLORIDE 0.9 % IV SOLN
2.0000 g | Freq: Two times a day (BID) | INTRAVENOUS | Status: AC
  Filled 2020-10-01: qty 2

## 2020-10-01 MED ORDER — ROCURONIUM BROMIDE 100 MG/10ML IV SOLN
INTRAVENOUS | Status: DC | PRN
  Administered 2020-10-01: 30 mg via INTRAVENOUS
  Administered 2020-10-01: 20 mg via INTRAVENOUS
  Administered 2020-10-01: 10 mg via INTRAVENOUS

## 2020-10-01 MED ORDER — ONDANSETRON HCL 4 MG/2ML IJ SOLN
4.0000 mg | Freq: Four times a day (QID) | INTRAMUSCULAR | Status: DC | PRN
  Administered 2020-10-02 – 2020-10-21 (×21): 4 mg via INTRAVENOUS
  Filled 2020-10-01 (×21): qty 2

## 2020-10-01 MED ORDER — SODIUM CHLORIDE 0.9 % IV SOLN
INTRAVENOUS | Status: AC
  Administered 2020-10-01: 2 g via INTRAVENOUS
  Filled 2020-10-01: qty 2

## 2020-10-01 MED ORDER — MORPHINE SULFATE (PF) 4 MG/ML IV SOLN
4.0000 mg | INTRAVENOUS | Status: DC | PRN
  Administered 2020-10-01 – 2020-10-05 (×14): 4 mg via INTRAVENOUS
  Filled 2020-10-01 (×13): qty 1

## 2020-10-01 MED ORDER — PROPOFOL 10 MG/ML IV BOLUS
INTRAVENOUS | Status: DC | PRN
  Administered 2020-10-01: 200 mg via INTRAVENOUS

## 2020-10-01 MED ORDER — LORATADINE 10 MG PO TABS
10.0000 mg | ORAL_TABLET | Freq: Every day | ORAL | Status: DC
  Administered 2020-10-01 – 2020-10-24 (×23): 10 mg via ORAL
  Filled 2020-10-01 (×23): qty 1

## 2020-10-01 MED ORDER — METOPROLOL TARTRATE 5 MG/5ML IV SOLN
INTRAVENOUS | Status: DC | PRN
  Administered 2020-10-01: 2 mg via INTRAVENOUS
  Administered 2020-10-01: 1 mg via INTRAVENOUS
  Administered 2020-10-01: 2 mg via INTRAVENOUS

## 2020-10-01 MED ORDER — FAMOTIDINE 20 MG PO TABS
20.0000 mg | ORAL_TABLET | Freq: Once | ORAL | Status: AC
  Administered 2020-10-01: 20 mg via ORAL

## 2020-10-01 MED ORDER — GABAPENTIN 300 MG PO CAPS
300.0000 mg | ORAL_CAPSULE | ORAL | Status: AC
  Administered 2020-10-01: 300 mg via ORAL

## 2020-10-01 MED ORDER — KETOROLAC TROMETHAMINE 30 MG/ML IJ SOLN
30.0000 mg | Freq: Four times a day (QID) | INTRAMUSCULAR | Status: AC | PRN
  Administered 2020-10-01 – 2020-10-02 (×3): 30 mg via INTRAVENOUS
  Filled 2020-10-01 (×3): qty 1

## 2020-10-01 MED ORDER — SODIUM CHLORIDE 0.9 % IV SOLN
INTRAVENOUS | Status: AC
  Filled 2020-10-01: qty 2

## 2020-10-01 MED ORDER — ONDANSETRON HCL 4 MG/2ML IJ SOLN: 4.0000 mg | Freq: Once | INTRAMUSCULAR | Status: DC | PRN

## 2020-10-01 MED ORDER — SODIUM CHLORIDE 0.9 % IV SOLN
2.0000 g | INTRAVENOUS | Status: AC
  Administered 2020-10-01: 2 g via INTRAVENOUS

## 2020-10-01 MED ORDER — PANTOPRAZOLE SODIUM 40 MG IV SOLR
40.0000 mg | Freq: Every day | INTRAVENOUS | Status: DC
  Administered 2020-10-01 – 2020-10-23 (×22): 40 mg via INTRAVENOUS
  Filled 2020-10-01 (×22): qty 40

## 2020-10-01 MED ORDER — MIDAZOLAM HCL 2 MG/2ML IJ SOLN
INTRAMUSCULAR | Status: AC
  Filled 2020-10-01: qty 2

## 2020-10-01 MED ORDER — FENTANYL CITRATE (PF) 100 MCG/2ML IJ SOLN
25.0000 ug | INTRAMUSCULAR | Status: DC | PRN
  Administered 2020-10-01 (×3): 25 ug via INTRAVENOUS

## 2020-10-01 MED ORDER — MIDAZOLAM HCL 2 MG/2ML IJ SOLN
INTRAMUSCULAR | Status: DC | PRN
  Administered 2020-10-01: 2 mg via INTRAVENOUS

## 2020-10-01 MED ORDER — ACETAMINOPHEN 500 MG PO TABS
1000.0000 mg | ORAL_TABLET | Freq: Four times a day (QID) | ORAL | Status: DC
  Administered 2020-10-01 – 2020-10-05 (×5): 1000 mg via ORAL
  Filled 2020-10-01 (×10): qty 2

## 2020-10-01 SURGICAL SUPPLY — 117 items
ADAPTER GOLDBERG URETERAL (ADAPTER) IMPLANT
ADH SKN CLS APL DERMABOND .7 (GAUZE/BANDAGES/DRESSINGS) ×2
ADPR CATH 15X14FR FL DRN BG (ADAPTER)
ANCHOR TIS RET SYS 1550ML (BAG) IMPLANT
APL PRP STRL LF DISP 70% ISPRP (MISCELLANEOUS) ×2
BAG BILE T-TUBES STRL (MISCELLANEOUS) IMPLANT
BAG DRAIN CYSTO-URO LG1000N (MISCELLANEOUS) ×3 IMPLANT
BAG DRN 9.5 2 ADJ BELT ADPR (MISCELLANEOUS)
BAG DRN LRG CPC RND TRDRP CNTR (MISCELLANEOUS) ×2
BAG DRN RND TRDRP ANRFLXCHMBR (UROLOGICAL SUPPLIES) ×2
BAG SPEC RTRVL C1550 25.4 (BAG)
BAG URINE DRAIN 2000ML AR STRL (UROLOGICAL SUPPLIES) ×3 IMPLANT
BAG URO DRAIN 4000ML (MISCELLANEOUS) ×3 IMPLANT
BLADE SURG SZ10 CARB STEEL (BLADE) ×3 IMPLANT
BRUSH SCRUB EZ 1% IODOPHOR (MISCELLANEOUS) ×3 IMPLANT
CANISTER SUCT 1200ML W/VALVE (MISCELLANEOUS) ×3 IMPLANT
CANNULA REDUC XI 12-8 STAPL (CANNULA) ×3
CANNULA REDUCER 12-8 DVNC XI (CANNULA) ×2 IMPLANT
CATH FOLEY 2WAY  5CC 16FR (CATHETERS)
CATH FOLEY 2WAY 5CC 16FR (CATHETERS)
CATH FOLEY SIL 2WAY 14FR5CC (CATHETERS) IMPLANT
CATH URETL 5X70 OPEN END (CATHETERS) ×3 IMPLANT
CATH URTH 16FR FL 2W BLN LF (CATHETERS) IMPLANT
CHLORAPREP W/TINT 26 (MISCELLANEOUS) ×3 IMPLANT
COVER TIP SHEARS 8 DVNC (MISCELLANEOUS) ×2 IMPLANT
COVER TIP SHEARS 8MM DA VINCI (MISCELLANEOUS) ×3
COVER WAND RF STERILE (DRAPES) ×3 IMPLANT
DECANTER SPIKE VIAL GLASS SM (MISCELLANEOUS) ×3 IMPLANT
DEFOGGER SCOPE WARMER CLEARIFY (MISCELLANEOUS) ×3 IMPLANT
DERMABOND ADVANCED (GAUZE/BANDAGES/DRESSINGS) ×1
DERMABOND ADVANCED .7 DNX12 (GAUZE/BANDAGES/DRESSINGS) ×2 IMPLANT
DRAPE ARM DVNC X/XI (DISPOSABLE) ×8 IMPLANT
DRAPE COLUMN DVNC XI (DISPOSABLE) ×2 IMPLANT
DRAPE DA VINCI XI ARM (DISPOSABLE) ×12
DRAPE DA VINCI XI COLUMN (DISPOSABLE) ×3
DRAPE LEGGINS SURG 28X43 STRL (DRAPES) ×3 IMPLANT
DRAPE UNDER BUTTOCK W/FLU (DRAPES) ×3 IMPLANT
ELECT REM PT RETURN 9FT ADLT (ELECTROSURGICAL) ×3
ELECTRODE REM PT RTRN 9FT ADLT (ELECTROSURGICAL) ×2 IMPLANT
GELPOINT ADV PLATFORM (ENDOMECHANICALS)
GLOVE BIO SURGEON STRL SZ 6.5 (GLOVE) ×3 IMPLANT
GLOVE ORTHO TXT STRL SZ7.5 (GLOVE) ×12 IMPLANT
GOWN STRL REUS W/ TWL LRG LVL3 (GOWN DISPOSABLE) ×14 IMPLANT
GOWN STRL REUS W/TWL LRG LVL3 (GOWN DISPOSABLE) ×21
GRASPER SUT TROCAR 14GX15 (MISCELLANEOUS) IMPLANT
GUIDEWIRE STR DUAL SENSOR (WIRE) ×2 IMPLANT
HANDLE YANKAUER SUCT BULB TIP (MISCELLANEOUS) ×3 IMPLANT
IRRIGATION STRYKERFLOW (MISCELLANEOUS) IMPLANT
IRRIGATOR STRYKERFLOW (MISCELLANEOUS) ×3
IV NS IRRIG 3000ML ARTHROMATIC (IV SOLUTION) ×1 IMPLANT
KIT IMAGING PINPOINTPAQ (MISCELLANEOUS) ×3 IMPLANT
KIT PINK PAD W/HEAD ARE REST (MISCELLANEOUS) ×3
KIT PINK PAD W/HEAD ARM REST (MISCELLANEOUS) ×2 IMPLANT
KIT TURNOVER CYSTO (KITS) ×3 IMPLANT
KIT TURNOVER KIT A (KITS) ×3 IMPLANT
NDL INSUFFLATION 14GA 120MM (NEEDLE) ×2 IMPLANT
NEEDLE HYPO 22GX1.5 SAFETY (NEEDLE) ×3 IMPLANT
NEEDLE INSUFFLATION 14GA 120MM (NEEDLE) ×3 IMPLANT
NS IRRIG 500ML POUR BTL (IV SOLUTION) ×3 IMPLANT
PACK COLON CLEAN CLOSURE (MISCELLANEOUS) ×2 IMPLANT
PACK CYSTO AR (MISCELLANEOUS) ×3 IMPLANT
PACK LAP CHOLECYSTECTOMY (MISCELLANEOUS) ×3 IMPLANT
PAD PREP 24X41 OB/GYN DISP (PERSONAL CARE ITEMS) ×2 IMPLANT
PLATFORM STD W/COL CELL SVR (ENDOMECHANICALS) ×2 IMPLANT
RELOAD STAPLE 45 3.5 BLU DVNC (STAPLE) IMPLANT
RELOAD STAPLE 60 3.5 BLU DVNC (STAPLE) IMPLANT
RELOAD STAPLER 3.5X45 BLU DVNC (STAPLE) IMPLANT
RELOAD STAPLER 3.5X60 BLU DVNC (STAPLE) IMPLANT
RETRACTOR WOUND ALXS 18CM MED (MISCELLANEOUS) IMPLANT
RTRCTR WOUND ALEXIS O 18CM MED (MISCELLANEOUS)
SCISSORS METZENBAUM CVD 33 (INSTRUMENTS) IMPLANT
SEAL CANN UNIV 5-8 DVNC XI (MISCELLANEOUS) ×6 IMPLANT
SEAL XI 5MM-8MM UNIVERSAL (MISCELLANEOUS) ×9
SEALER VESSEL DA VINCI XI (MISCELLANEOUS) ×3
SEALER VESSEL EXT DVNC XI (MISCELLANEOUS) IMPLANT
SET CYSTO W/LG BORE CLAMP LF (SET/KITS/TRAYS/PACK) ×3 IMPLANT
SOL .9 NS 3000ML IRR  AL (IV SOLUTION) ×3
SOL .9 NS 3000ML IRR AL (IV SOLUTION) ×2
SOL .9 NS 3000ML IRR UROMATIC (IV SOLUTION) ×2 IMPLANT
SOL PREP PVP 2OZ (MISCELLANEOUS) ×3
SOLUTION ELECTROLUBE (MISCELLANEOUS) ×3 IMPLANT
SOLUTION PREP PVP 2OZ (MISCELLANEOUS) ×2 IMPLANT
SPONGE LAP 4X18 RFD (DISPOSABLE) ×1 IMPLANT
STAPLER 45 DA VINCI SURE FORM (STAPLE)
STAPLER 45 SUREFORM DVNC (STAPLE) IMPLANT
STAPLER 60 DA VINCI SURE FORM (STAPLE)
STAPLER 60 SUREFORM DVNC (STAPLE) IMPLANT
STAPLER CANNULA SEAL DVNC XI (STAPLE) ×2 IMPLANT
STAPLER CANNULA SEAL XI (STAPLE) ×3
STAPLER CIRCULAR MANUAL XL 33 (STAPLE) ×1 IMPLANT
STAPLER RELOAD 3.5X45 BLU DVNC (STAPLE)
STAPLER RELOAD 3.5X45 BLUE (STAPLE)
STAPLER RELOAD 3.5X60 BLU DVNC (STAPLE)
STAPLER RELOAD 3.5X60 BLUE (STAPLE)
STAPLER SKIN PROX 35W (STAPLE) IMPLANT
STENT URET 6FRX24 CONTOUR (STENTS) IMPLANT
STENT URET 6FRX26 CONTOUR (STENTS) IMPLANT
SURGILUBE 2OZ TUBE FLIPTOP (MISCELLANEOUS) ×6 IMPLANT
SUT DVC VLOC 90 3-0 CV23 VLT (SUTURE) ×9
SUT MNCRL 4-0 (SUTURE) ×3
SUT MNCRL 4-0 27XMFL (SUTURE) ×2
SUT SILK 0 (SUTURE)
SUT SILK 0 30XBRD TIE 6 (SUTURE) IMPLANT
SUT V-LOC 90 ABS 3-0 VLT  V-20 (SUTURE) ×3
SUT V-LOC 90 ABS 3-0 VLT V-20 (SUTURE) IMPLANT
SUT VIC AB 3-0 SH 27 (SUTURE)
SUT VIC AB 3-0 SH 27X BRD (SUTURE) ×4 IMPLANT
SUTURE DVC VLC 90 3-0 CV23 VLT (SUTURE) ×2 IMPLANT
SUTURE MNCRL 4-0 27XMF (SUTURE) ×4 IMPLANT
SYR TOOMEY IRRIG 70ML (MISCELLANEOUS) ×3
SYRINGE TOOMEY IRRIG 70ML (MISCELLANEOUS) ×2 IMPLANT
TRAY FOLEY MTR SLVR 16FR STAT (SET/KITS/TRAYS/PACK) ×2 IMPLANT
TROCAR Z-THREAD FIOS 11X100 BL (TROCAR) ×3 IMPLANT
TROCAR Z-THREAD OPTICAL 5X100M (TROCAR) IMPLANT
TUBING ART PRESS 48 MALE/FEM (TUBING) IMPLANT
TUBING EVAC SMOKE HEATED PNEUM (TUBING) ×3 IMPLANT
WATER STERILE IRR 1000ML POUR (IV SOLUTION) ×3 IMPLANT

## 2020-10-01 NOTE — Transfer of Care (Signed)
Immediate Anesthesia Transfer of Care Note  Patient: Jordan [name]  Procedure(s) Performed: XI ROBOT ASSISTED SIGMOID COLECTOMY (N/A ) INDOCYANINE GREEN FLUORESCENCE IMAGING (ICG) (Bilateral )  Patient Location: PACU  Anesthesia Type:General  Level of Consciousness: awake, alert  and oriented  Airway & Oxygen Therapy: Patient Spontanous Breathing and Patient connected to face mask oxygen  Post-op Assessment: Report given to RN and Post -op Vital signs reviewed and stable  Post vital signs: Reviewed and stable  Last Vitals:  Vitals Value Taken Time  BP 182/111 10/01/20 1427  Temp    Pulse 66 10/01/20 1432  Resp 20 10/01/20 1432  SpO2 100 % 10/01/20 1432  Vitals shown include unvalidated device data.  Last Pain:  Vitals:   10/01/20 0714  TempSrc: Oral  PainSc: 0-No pain         Complications: No complications documented.

## 2020-10-01 NOTE — Interval H&P Note (Signed)
History and Physical Interval Note:  10/01/2020 8:14 AM  Jordan [name]  has presented today for surgery, with the diagnosis of Colovesicle fistula.  The various methods of treatment have been discussed with the patient and family. After consideration of risks, benefits and other options for treatment, the patient has consented to  Procedure(s): XI ROBOT ASSISTED SIGMOID COLECTOMY (N/A) INDOCYANINE GREEN FLUORESCENCE IMAGING (ICG) (Bilateral) as a surgical intervention.  The patient's history has been reviewed, patient examined, no change in status, stable for surgery.  I have reviewed the patient's chart and labs.  Questions were answered to the patient's satisfaction.    RRR CTAB  Dr. Claudine Mouton requesting bilateral ureteral injection of ICG for the purpose of ureteral identification.  I discussed this procedure with the patient today in the preoperative holding area.  Risk of bleeding, infection, damage surrounding structures amongst others were discussed.  All questions answered.  Vanna Scotland

## 2020-10-01 NOTE — Anesthesia Procedure Notes (Signed)
Procedure Name: Intubation Performed by: Danelle Berry, CRNA Pre-anesthesia Checklist: Patient identified, Emergency Drugs available, Suction available, Patient being monitored and Timeout performed Patient Re-evaluated:Patient Re-evaluated prior to induction Oxygen Delivery Method: Circle system utilized and Simple face mask Preoxygenation: Pre-oxygenation with 100% oxygen Induction Type: IV induction Ventilation: Mask ventilation without difficulty Laryngoscope Size: McGraph and 4 Grade View: Grade II Tube type: Oral Tube size: 7.5 mm Number of attempts: 1 Airway Equipment and Method: Stylet Placement Confirmation: ETT inserted through vocal cords under direct vision,  positive ETCO2 and breath sounds checked- equal and bilateral Secured at: 22 cm Tube secured with: Tape Dental Injury: Teeth and Oropharynx as per pre-operative assessment

## 2020-10-01 NOTE — Op Note (Signed)
Date of procedure: 10/01/20  Preoperative diagnosis:  1. Colovesical fistula  Postoperative diagnosis:  1. Same as above  Procedure: 1. Cystoscopy 2. Instillation of ureteral ICG bilateral  Surgeon: Vanna Scotland, MD  Anesthesia: General  Complications: None  Intraoperative findings: Mild amount of inflammatory change in posterior bladder wall consistent with known colovesical fistula.  EBL: Minimal  Specimens: None  Drains: 16 French Foley catheter  Indication: Jordan [name] III is a 39 y.o. patient with colovesical fistula undergoing colectomy with Dr. Claudine Mouton.  Urology has been asked to speak by injecting retrograde ICG into the ureters for the purpose of ureteral identification.  After reviewing the management options for treatment, he elected to proceed with the above surgical procedure(s). We have discussed the potential benefits and risks of the procedure, side effects of the proposed treatment, the likelihood of the patient achieving the goals of the procedure, and any potential problems that might occur during the procedure or recuperation. Informed consent has been obtained.  Description of procedure:  The patient was taken to the operating room and general anesthesia was induced.  The patient was placed in the dorsal lithotomy position, prepped and draped in the usual sterile fashion, and preoperative antibiotics were administered. A preoperative time-out was performed.   A 21 French scope was advanced per urethra into the bladder.  Notably, on entry to the bladder there was some erythema in the posterior bladder wall as well as some additional raised inflammatory change in the setting of known colovesical fistula.  Attention was turned to the left ureteral orifice which was cannulated and a 5 [place] open-ended ureteral catheter this advanced easily and a total of 10 cc of ICG contrast was injected into the ureter slowly.  Exam is at procedure was performed  on the contralateral side.  There is a flecks of green material from both UOs at the end of the procedure.  The scope was then removed and a Foley catheter was placed, 16 [place].  The balloon was filled with 10 cc of sterile water.  Remainder the procedure was then performed by Dr. Claudine Mouton.  Please see his dictation.  Vanna Scotland, M.D.

## 2020-10-01 NOTE — Op Note (Addendum)
Robotic sigmoid colectomy with coloproctostomy, intracorporeal anastomosis with natural orifice specimen extraction.  (NICE)  Pre-operative Diagnosis: Colovesical fistula secondary to diverticulitis  Post-operative Diagnosis: same.   Surgeon: [name], M.D., FACS  Assistant: Lincoln Brigham, PA-C  Anesthesia: General endotracheal.  Findings: As expected tethered sigmoid colon, scarred to left dome of urinary bladder.  Fistula without residual patency once division completed.  Tested with 300 to 400 mL of saline solution distention within the bladder without evidence of leak.  Estimated Blood Loss: 50 mL         Specimens: Sigmoid colon          Complications: none              Procedure Details  The patient was seen again in the Holding Room. The benefits, complications, treatment options, and expected outcomes were discussed with the patient. The risks of bleeding, infection, recurrence of symptoms, failure to resolve symptoms, unanticipated injury, prosthetic placement, prosthetic infection, any of which could require further surgery were reviewed with the patient. The likelihood of improving the patient's symptoms with return to their baseline status is good.  The patient and/or family concurred with the proposed plan, giving informed consent.  The patient was taken to Operating Room, identified and the procedure verified.    Prior to the induction of general anesthesia, antibiotic prophylaxis was administered. VTE prophylaxis was in place.  General anesthesia was then administered and tolerated well. After the induction, the patient was positioned in the lithotomy position, for his cystoscopy and urological ICG instillation for ureteral visualization.  Once this was completed the perineum was prepped with Betadine and the abdomen was prepped with Chloraprep and draped in the sterile fashion.  A Time Out was held and the above information confirmed.  After local infiltration of  quarter percent Marcaine with epinephrine, stab incision was made left upper quadrant.  Just below the costal margin approximately midclavicular line the Veress needle is passed with sensation of the layers to penetrate the abdominal wall and into the peritoneum.  Saline drop test is confirmed peritoneal placement.  Insufflation is initiated with carbon dioxide to pressures of 15 mmHg.  Right upper quadrant incision was made after infiltration of local anesthetic and an 8.5 mm robotic trocar was passed into the peritoneal cavity without difficulty.  Immediate evaluation with the laparoscope was performed identifying and confirming no evidence of any intraperitoneal adhesions or injury or abnormality.  3 additional 8.5 mm trochars were placed creating spacing along the line extending from the right lower quadrant to the left costal margin.  This was completed under direct visualization.  We then positioned the patient with 17 of Trendelenburg and placed him right sided down about 10.  This enabled [place] to adequately visualize the sigmoid colon in the pelvis and the retroperitoneum at the sacral promontory. With the fenestrated tips up grasper, fenestrated bipolar, and monopolar scissors we proceeded with the retroperitoneal dissection.  I was able to identify the plane between the mesorectum and the retroperitoneum, and created this plane in the retromesenteric space from medial to lateral.  Identified the left ureter which closed nicely under firefly.  I extended this dissection with blunt dissection from medial to lateral and from caudad to cephalad and across the pelvic inlet to mobilize the proximal rectum. I then took down the attachments to the bladder with sharp dissection assisted with monopolar and bipolar cautery for maintaining hemostasis.  There is a little mucopurulent discharge at the junction of the fistula, however  I saw no frank stool, nor urine either. We then mobilized the sigmoid colon by  dividing its lateral peritoneal attachments and joining into the previously dissected retromesenteric space.  This was completed up towards the splenic flexure mobilizing the left colon adequately to reach into the pelvis. Then proceeded with mobilizing the proximal rectum posteriorly, subsequently taking bilateral supportive ligaments, and then even mobilizing it anteriorly at the pelvic peritoneal reflection. I then went from the robotic console to evaluate the patient's rectum utilizing the EEA sizers and confirming the distance at the current level of dissection.  I felt this was a good reach at the present level and selected a site for division.  I also selected a site for division based upon the disease process on the proximal sigmoid.  Then proceeded with division of the mesorectum and the sigmoid mesentery to these points of division. Firefly confirmed adequate vascularity at these points and I proceeded with dividing both proximal and distal utilizing the vessel sealer with limited cautery while dividing the colon and rectum. I then sutured closed both proximal distal ends of the specimen with 3 OV lock. I then positioned the colon for retrieval through the rectum.  I sized the rectum up to 33 mm, and then utilizing the ring forceps was able to grasp the specimen.  Utilizing a constant spiraling motion and gentle steady pull, I was able to remove the specimen via the transanal route without difficulty. I then passed the 33 mm EEA anvil via the rectum, placed it into the distal descending colon, and secured it with a pursestring of 3 OV lock. My assistant then presented and placed the EEA into the rectum and I proceeded with closing the rectum over the EEA stapler.  We were then able to dock and create an end-to-end anastomosis.  He had 2 excellent donuts upon completion of the anastomosis. We then confirmed the anastomosis to be airtight with instilling the pelvis with fluid.  We then irrigated and  aspirated the pelvis to clear. Utilized 3 to 4 L of sterile saline solution to irrigate the pelvis clear all the blood and soilage.  We then undocked the robot desufflated the abdomen and remove the trochars. We then closed the skin incisions with interrupted subcuticulars of 4-0 Monocryl, and sealed the skin with Dermabond.  Overall the patient tolerated procedure well.  At the end of the procedure we identified that he had a significant exposure to a family member that was symptomatic for COVID-19, and who had a known friend who had recently tested positive for COVID-19.  Therefore he will be recovered in the operating room, and tested and treated as though positive in the interim.   [name] M.D., [facility] 10/01/2020 2:17 PM

## 2020-10-01 NOTE — Anesthesia Preprocedure Evaluation (Signed)
Anesthesia Evaluation  Patient identified by MRN, date of birth, ID band Patient awake    Reviewed: Allergy & Precautions, H&P , NPO status , Patient's Chart, lab work & pertinent test results  History of Anesthesia Complications Negative for: history of anesthetic complications  Airway Mallampati: III  TM Distance: <3 FB Neck ROM: full    Dental  (+) Chipped   Pulmonary asthma , sleep apnea and Continuous Positive Airway Pressure Ventilation , former smoker,    Pulmonary exam normal        Cardiovascular Exercise Tolerance: Good hypertension, (-) angina(-) Past MI and (-) DOE Normal cardiovascular exam     Neuro/Psych PSYCHIATRIC DISORDERS Anxiety Depression negative neurological ROS     GI/Hepatic negative GI ROS, Neg liver ROS, neg GERD  ,  Endo/Other  Morbid obesity  Renal/GU negative Renal ROSstones  negative genitourinary   Musculoskeletal  (+) Arthritis ,   Abdominal   Peds  Hematology negative hematology ROS (+)   Anesthesia Other Findings Past Medical History: No date: Anxiety and depression No date: Asthma No date: Chronic pain syndrome No date: History of kidney stones No date: HYPERTENSION, MILD No date: OSTEOARTHRITIS No date: Recurrent UTI No date: Sleep apnea  Past Surgical History: No date: JOINT REPLACEMENT No date: KNEE SURGERY; Right     Comment:  7 total surgeries No date: REPLACEMENT TOTAL KNEE; Right     Reproductive/Obstetrics negative OB ROS                             Anesthesia Physical  Anesthesia Plan  ASA: III  Anesthesia Plan: General   Post-op Pain Management:    Induction: Intravenous  PONV Risk Score and Plan:   Airway Management Planned: Oral ETT  Additional Equipment:   Intra-op Plan:   Post-operative Plan: Extubation in OR  Informed Consent: I have reviewed the patients History and Physical, chart, labs and discussed the  procedure including the risks, benefits and alternatives for the proposed anesthesia with the patient or authorized representative who has indicated his/her understanding and acceptance.     Dental Advisory Given  Plan Discussed with: Anesthesiologist, CRNA and Surgeon  Anesthesia Plan Comments: (Patient consented for risks of anesthesia including but not limited to:  - adverse reactions to medications - risk of intubation if required - damage to eyes, teeth, lips or other oral mucosa - nerve damage due to positioning  - sore throat or hoarseness - Damage to heart, brain, nerves, lungs, other parts of body or loss of life  Patient voiced understanding.)        Anesthesia Quick Evaluation

## 2020-10-01 NOTE — Interval H&P Note (Signed)
History and Physical Interval Note:  10/01/2020 8:26 AM  Jordan [name]  has presented today for surgery, with the diagnosis of Colovesicle fistula.  The various methods of treatment have been discussed with the patient and family. After consideration of risks, benefits and other options for treatment, the patient has consented to  Procedure(s): XI ROBOT ASSISTED SIGMOID COLECTOMY (N/A) INDOCYANINE GREEN FLUORESCENCE IMAGING (ICG) (Bilateral) ureter imaging as a surgical intervention.  The patient's history has been reviewed, patient examined, no change in status, stable for surgery.  I have reviewed the patient's chart and labs.  Questions were answered to the patient's satisfaction.     Jordan [name]

## 2020-10-02 DIAGNOSIS — U071 COVID-19: Secondary | ICD-10-CM | POA: Diagnosis not present

## 2020-10-02 LAB — CBC
HCT: 41.1 % (ref 39.0–52.0)
Hemoglobin: 13.5 g/dL (ref 13.0–17.0)
MCH: 26.8 pg (ref 26.0–34.0)
MCHC: 32.8 g/dL (ref 30.0–36.0)
MCV: 81.5 fL (ref 80.0–100.0)
Platelets: 281 10*3/uL (ref 150–400)
RBC: 5.04 MIL/uL (ref 4.22–5.81)
RDW: 15.1 % (ref 11.5–15.5)
WBC: 15.3 10*3/uL — ABNORMAL HIGH (ref 4.0–10.5)
nRBC: 0 % (ref 0.0–0.2)

## 2020-10-02 LAB — BASIC METABOLIC PANEL
Anion gap: 7 (ref 5–15)
BUN: 6 mg/dL (ref 6–20)
CO2: 27 mmol/L (ref 22–32)
Calcium: 8.1 mg/dL — ABNORMAL LOW (ref 8.9–10.3)
Chloride: 106 mmol/L (ref 98–111)
Creatinine, Ser: 0.9 mg/dL (ref 0.61–1.24)
GFR calc non Af Amer: >60 mL/min (ref >60)
Glucose, Bld: 124 mg/dL — ABNORMAL HIGH (ref 70–99)
Potassium: 3.8 mmol/L (ref 3.5–5.1)
Sodium: 140 mmol/L (ref 135–145)

## 2020-10-02 MED ORDER — DIPHENHYDRAMINE HCL 50 MG/ML IJ SOLN
50.0000 mg | Freq: Once | INTRAMUSCULAR | Status: DC | PRN
  Filled 2020-10-02: qty 1

## 2020-10-02 MED ORDER — LACTATED RINGERS IV BOLUS
500.0000 mL | Freq: Once | INTRAVENOUS | Status: AC
  Administered 2020-10-02: 500 mL via INTRAVENOUS

## 2020-10-02 MED ORDER — ALBUTEROL SULFATE HFA 108 (90 BASE) MCG/ACT IN AERS
2.0000 | INHALATION_SPRAY | Freq: Once | RESPIRATORY_TRACT | Status: DC | PRN
  Filled 2020-10-02: qty 6.7

## 2020-10-02 MED ORDER — EPINEPHRINE 0.3 MG/0.3ML IJ SOAJ
0.3000 mg | Freq: Once | INTRAMUSCULAR | Status: DC | PRN
  Filled 2020-10-02: qty 0.3

## 2020-10-02 MED ORDER — PROMETHAZINE HCL 25 MG/ML IJ SOLN
12.5000 mg | Freq: Four times a day (QID) | INTRAMUSCULAR | Status: DC | PRN
  Administered 2020-10-03 – 2020-10-16 (×3): 12.5 mg via INTRAVENOUS
  Filled 2020-10-02 (×3): qty 1

## 2020-10-02 MED ORDER — SODIUM CHLORIDE 0.9 % IV SOLN
1200.0000 mg | Freq: Once | INTRAVENOUS | Status: AC
  Administered 2020-10-02: 16:00:00 1200 mg via INTRAVENOUS
  Filled 2020-10-02: qty 10

## 2020-10-02 MED ORDER — SODIUM CHLORIDE 0.9 % IV SOLN: INTRAVENOUS | Status: DC | PRN

## 2020-10-02 MED ORDER — GUAIFENESIN-DM 100-10 MG/5ML PO SYRP
5.0000 mL | ORAL_SOLUTION | ORAL | Status: DC | PRN
  Administered 2020-10-03 (×2): 5 mL via ORAL
  Filled 2020-10-02 (×2): qty 5

## 2020-10-02 MED ORDER — SODIUM CHLORIDE 0.9 % IV SOLN
Freq: Once | INTRAVENOUS | Status: DC
  Filled 2020-10-02: qty 5

## 2020-10-02 MED ORDER — METHOCARBAMOL 500 MG PO TABS
500.0000 mg | ORAL_TABLET | Freq: Three times a day (TID) | ORAL | Status: DC | PRN
  Filled 2020-10-02: qty 1

## 2020-10-02 MED ORDER — METHYLPREDNISOLONE SODIUM SUCC 125 MG IJ SOLR: 125.0000 mg | Freq: Once | INTRAMUSCULAR | Status: DC | PRN

## 2020-10-02 MED ORDER — FAMOTIDINE IN NACL 20-0.9 MG/50ML-% IV SOLN
20.0000 mg | Freq: Once | INTRAVENOUS | Status: DC | PRN
  Filled 2020-10-02: qty 50

## 2020-10-02 MED ORDER — KETOROLAC TROMETHAMINE 30 MG/ML IJ SOLN
30.0000 mg | Freq: Four times a day (QID) | INTRAMUSCULAR | Status: DC | PRN
  Administered 2020-10-03 – 2020-10-04 (×4): 30 mg via INTRAVENOUS
  Filled 2020-10-02 (×5): qty 1

## 2020-10-02 NOTE — Progress Notes (Addendum)
Progress Note Asked to come check on patient by his RN secondary to worsening pain at his incision sites. Patient reports that he had an episode of coughing and he felt like he had a "bulge" and sharp pain at his most left lateral incision site. This has since resolved however his middle right incision has become erythematous and tender in the last few hours. He has remained afebrile. He does become intermittently tachycardic with coughing and has developed nausea and dry heaves since starting CLD this morning.   On examination:  BP (!) 109/58 (BP Location: Right Arm)   Pulse (!) 104   Temp 98.8 F (37.1 C) (Oral)   Resp 20   Ht 5\' 6"  (1.676 m)   Wt 129.7 kg   SpO2 96%   BMI 46.16 kg/m   Patient appears uncomfortably, turned on side, frequent coughing Tachycardic but regular No respiratory distress, on [place] Abdomen is obese, soft, he does have incisional soreness, mild distension present, I do not appreciate any rebound/guarding/rigidity His laparoscopic incisions are intact with dermabond. His right middle incisions has surrounding erythema and induration present, I do not appreciate any palpable fluctuance or crepitus. The remaining laparoscopic sites are without erythema.  Foley in place  Assessment / Plan: It is very early in the post-operative course for a wound infection to present and he is without fever which is reassuring. This may also represent an underlying hematoma as well. I do not appreciate any drainable fluid collections nor evidence of necrotizing infection on my examination at this time. Additionally, he may be developing some degree of ileus given his nausea/dry heaving with CLD.   -- I initiated IV Zosyn for potential wound infection -- Erythema marked and dated; monitor for worsening -- Diet backed down to NPO -- Morning labs ordered along with KUB -- Added Toradol and Robaxin for pain control -- phenergan added for antiemetics in case Zofran not effective --  Additionally, discussed administration of MAB (REGEN-COV) with pharmacist and patient, patient is consenting to this treatment -- We will follow closely  Above discussed with Dr .  -- Claudine Mouton, PA-C [facility] 10/02/2020, 1:39 PM [phone] M-F: 7am - 4pm

## 2020-10-02 NOTE — Anesthesia Postprocedure Evaluation (Signed)
Anesthesia Post Note  Patient: Jordan [name]  Procedure(s) Performed: XI ROBOT ASSISTED SIGMOID COLECTOMY (N/A ) INDOCYANINE GREEN FLUORESCENCE IMAGING (ICG) (Bilateral )  Patient location during evaluation: PACU Anesthesia Type: General Level of consciousness: awake and alert Pain management: pain level controlled Vital Signs Assessment: post-procedure vital signs reviewed and stable Respiratory status: spontaneous breathing, nonlabored ventilation, respiratory function stable and patient connected to nasal cannula oxygen Cardiovascular status: blood pressure returned to baseline and stable Postop Assessment: no apparent nausea or vomiting Anesthetic complications: no   No complications documented.   Last Vitals:  Vitals:   10/02/20 0002 10/02/20 0414  BP: 117/68 128/78  Pulse: (!) 106 94  Resp: (!) 21 20  Temp: 36.8 C 36.4 C  SpO2: 96% 96%    Last Pain:  Vitals:   10/02/20 0414  TempSrc: Oral  PainSc:                  Cleda Mccreedy Vander Kueker

## 2020-10-02 NOTE — Progress Notes (Addendum)
PT Cancellation Note  Patient Details Name: Jordan [name] MRN: [id number] DOB: 06-Dec-1981   Cancelled Treatment:    Reason Eval/Treat Not Completed: Pain limiting ability to participate - Pt agreeable for PLOF/subjective questioning but declined participation with mobility secondary to pain. Per pt report, he walked from bed>bathroom>recliner x2 with HHA from RN. Pt rates this ambulation as RPE of 7-8/10 indicating "vigorous activity". Pt notes increased pain in both R knee and LUQ from surgery. Per RN and MD note, pt with increased edema and erythema along one of pt's abdominal incision sites. Pt agreeable to follow up with PT mobility evaluation tomorrow when pain is more controlled.   Vira Blanco, PT, DPT 3:08 PM,10/02/20

## 2020-10-02 NOTE — Progress Notes (Addendum)
Pt right upper incision is warm to touch and red. Per notes on 10/07 at 1329 Dr. Manus Rudd addressed the site and marked the territory. Upon shift change, my bedside assessement revealed that the area had expanded. New area of expanison is maked with time and date. During bedside assessment, pt was afebrile however he is now febrile with an oral temp of 102. Dr, Katharine Look is the on call surgery consultant and he is notified of the current situation. Dr. Katharine Look recommends 1/2 bolus of lactated ringers and continue to monitor his temp as tylenol has already been given. Telephone orders are successfully read back.

## 2020-10-02 NOTE — Progress Notes (Signed)
   10/02/20 2033  Assess: MEWS Score  Temp 100.3 F (37.9 C)  BP 137/82  Pulse Rate (!) 127  Resp 20  SpO2 99 %  Assess: MEWS Score  MEWS Temp 0  MEWS Systolic 0  MEWS Pulse 2  MEWS RR 0  MEWS LOC 0  MEWS Score 2  MEWS Score Color Yellow  Assess: if the MEWS score is Yellow or Red  Were vital signs taken at a resting state? Yes  Focused Assessment Change from prior assessment (see assessment flowsheet)  Early Detection of Sepsis Score *See Row Information* Low  MEWS guidelines implemented *See Row Information* Yes  Treat  MEWS Interventions Administered prn meds/treatments  Pain Intervention(s) Medication (See eMAR)  Complains of Anxiety  Patients response to intervention Relief  Escalate  MEWS: Escalate Yellow: discuss with charge nurse/RN and consider discussing with provider and RRT  Notify: Charge Nurse/RN  Name of Charge Nurse/RN Notified Durwin Reges  Date Charge Nurse/RN Notified 10/02/20  Time Charge Nurse/RN Notified 2036  Document  Patient Outcome Stabilized after interventions  Progress note created (see row info) Yes

## 2020-10-02 NOTE — Progress Notes (Addendum)
[facility] SURGICAL PROGRESS NOTE  Hospital Day(s): 1.   Post op day(s): 1 Day Post-Op.   Interval History:  Patient seen and examined Unfortunately found to have potential COVID exposure pre-operatively and tested positive post-op, otherwise no acute events or new complaints overnight.  Patient reports he has diffuse and incisional soreness this morning No nausea, emesis Slight bump in leukocytosis post-op to 15.3K; no fevers Renal function normal, sCr - 0.90, good UO - 3.3L No electrolyte derangements He has been NPO post-op Endorses flatus Has not mobilized  Vital signs in last 24 hours: [min-max] current  Temp:  [97.5 F (36.4 C)-98.4 F (36.9 C)] 97.6 F (36.4 C) (10/07 0414) Pulse Rate:  [68-139] 94 (10/07 0414) Resp:  [16-26] 20 (10/07 0414) BP: (117-182)/(68-111) 128/78 (10/07 0414) SpO2:  [96 %-100 %] 96 % (10/07 0414)     Height: 5\' 6"  (167.6 cm) Weight: 129.7 kg BMI (Calculated): 46.18   Intake/Output last 2 shifts:  10/06 0701 - 10/07 0700 In: 2345 [P.O.:720; I.V.:1525; IV Piggyback:100] Out: 3300 [Urine:3300]   Physical Exam:  Constitutional: alert, cooperative and no distress  Respiratory: breathing non-labored at rest, on [place]  Cardiovascular: regular rate and sinus rhythm  Gastrointestinal: Obese, soft, diffuse soreness, and non-distended, no rebound/guarding Integumentary: Laparoscopic incisions are CDI with dermabond, no erythema or drainage   Labs:  CBC Latest Ref Rng & Units 10/02/2020 10/01/2020 10/01/2020  WBC 4.0 - 10.5 K/uL 15.3(H) 14.4(H) 6.0  Hemoglobin 13.0 - 17.0 g/dL 12/01/2020 26.8 34.1  Hematocrit 39 - 52 % 41.1 42.7 43.3  Platelets 150 - 400 K/uL 281 306 293   CMP Latest Ref Rng & Units 10/02/2020 10/01/2020 10/01/2020  Glucose 70 - 99 mg/dL 12/01/2020) 229(N) -  BUN 6 - 20 mg/dL 6 5(L) -  Creatinine 989(Q - 1.24 mg/dL 1.19 4.17 4.08)  Sodium 135 - 145 mmol/L 140 139 -  Potassium 3.5 - 5.1 mmol/L 3.8 3.9 -  Chloride 98 - 111 mmol/L  106 108 -  CO2 22 - 32 mmol/L 27 23 -  Calcium 8.9 - 10.3 mg/dL 8.1(L) 8.1(L) -  Total Protein 6.5 - 8.1 g/dL - - -  Total Bilirubin 0.3 - 1.2 mg/dL - - -  Alkaline Phos 38 - 126 U/L - - -  AST 15 - 41 U/L - - -  ALT 0 - 44 U/L - - -    Imaging studies: No new pertinent imaging studies   Assessment/Plan: 39 y.o. male 1 Day Post-Op s/p robotic assisted sigmoid colectomy for complicated diverticulitis with colovesical fistula, complicated by pertinent comorbidities including COVID+.   - Initiate CLD today; will not advance further for now  - Wean IVF resuscitation   - Continue foley catheter for today given history of colovesical fistula; will continue to reassess need  - Pain control prn; antiemetics prn  - Monitor abdominal examination; on-going bowel function   - Monitor leukocytosis  - Continue entereg until definitive bowel function    - Mobilize if feasible  - Home medications  - DVT prophylaxis   All of the above findings and recommendations were discussed with the patient, and the medical team, and all of patient's questions were answered to his expressed satisfaction.  -- 39, PA-C  [facility] 10/02/2020, 7:41 AM [phone] M-F: 7am - 4pm

## 2020-10-02 NOTE — Consult Note (Signed)
Pharmacy COVID-19 Monoclonal Antibody Screening  Jordan [name] was identified as being not hospitalized with symptoms from Covid-19 on admission but an incidental positive PCR has been documented.  The patient may qualify for the use of monoclonal antibodies (mAB) for COVID-19 viral infection to prevent worsening symptoms stemming from Covid-19 infection.  The patient was identified based on a positive COVID-19 PCR and not requiring the use of supplemental oxygen at this time.  This patient meets the FDA criteria for Emergency Use Authorization of casirivimab/imdevimab or bamlanivimab/etesevimab.  Has a (+) direct SARS-CoV-2 viral test result  Is NOT hospitalized due to COVID-19  Is within 10 days of symptom onset  Has at least one of the high risk factor(s) for progression to severe COVID-19 and/or hospitalization as defined in EUA.  Specific high risk criteria : BMI > 25  Additionally: The patient has had a positive COVID-19 PCR in the last 90 days.  The patient is unvaccinated against COVID-19.  Since the patient is unvaccinated and meets high risk criteria, the patient is eligible for mAB administration.   This eligibility and indication for treatment was discussed with the patient's physician: Jordan [name]/Jordan [name]  Plan: Based on the above discussion, it was decided that the patient will receive one dose of the available COVID-19 mAB combination. Pharmacy will coordinate administration timing with patient's nurse. Recommended infusion monitoring parameters communicated to the nursing team.   Albina Billet, PharmD, BCPS Clinical Pharmacist 10/02/2020 1:49 PM

## 2020-10-02 NOTE — Progress Notes (Signed)
   10/02/20 2200  Assess: MEWS Score  Temp (!) 102.1 F (38.9 C)  Assess: MEWS Score  MEWS Temp 2  MEWS Systolic 0  MEWS Pulse 2  MEWS RR 0  MEWS LOC 0  MEWS Score 4  MEWS Score Color Red  Assess: if the MEWS score is Yellow or Red  Were vital signs taken at a resting state? Yes  Focused Assessment Change from prior assessment (see assessment flowsheet)  Early Detection of Sepsis Score *See Row Information* High  MEWS guidelines implemented *See Row Information* Yes  Treat  MEWS Interventions Escalated (See documentation below);Administered scheduled meds/treatments  Pain Scale 0-10  Pain Score 6  Pain Type Surgical pain  Pain Intervention(s) Medication (See eMAR)  Take Vital Signs  Increase Vital Sign Frequency  Red: Q 1hr X 4 then Q 4hr X 4, if remains red, continue Q 4hrs  Escalate  MEWS: Escalate Red: discuss with charge nurse/RN and provider, consider discussing with RRT  Notify: Charge Nurse/RN  Name of Charge Nurse/RN Notified Jackie,RN   Time Charge Nurse/RN Notified 2240  Notify: Provider  Provider Name/Title Dr.Saki   Date Provider Notified 10/02/20  Time Provider Notified 2310  Document  Patient Outcome Not stable and remains on department  Progress note created (see row info) Yes

## 2020-10-02 NOTE — Plan of Care (Signed)
  Problem: Education: Goal: Knowledge of risk factors and measures for prevention of condition will improve Outcome: Progressing   

## 2020-10-02 NOTE — Progress Notes (Signed)
   10/01/20 2017  Assess: MEWS Score  Temp 98.3 F (36.8 C)  BP 130/82  Pulse Rate (!) 118  Resp 16  SpO2 97 %  O2 Device Room Air  Assess: MEWS Score  MEWS Temp 0  MEWS Systolic 0  MEWS Pulse 2  MEWS RR 0  MEWS LOC 0  MEWS Score 2  MEWS Score Color Yellow  Assess: if the MEWS score is Yellow or Red  Were vital signs taken at a resting state? Yes  Focused Assessment No change from prior assessment  Early Detection of Sepsis Score *See Row Information* Low  MEWS guidelines implemented *See Row Information* Yes  Treat  MEWS Interventions Escalated (See documentation below)  Take Vital Signs  Increase Vital Sign Frequency  Yellow: Q 2hr X 2 then Q 4hr X 2, if remains yellow, continue Q 4hrs  Escalate  MEWS: Escalate Yellow: discuss with charge nurse/RN and consider discussing with provider and RRT  Notify: Charge Nurse/RN  Name of Charge Nurse/RN Notified Corrie Dandy  Date Charge Nurse/RN Notified 10/01/20  Time Charge Nurse/RN Notified 2020  Notify: Provider  Provider Name/Title Jon Billings NP  Date Provider Notified 10/01/20  Time Provider Notified 2037  Notification Type Page  Notification Reason Change in status  Response Other (Comment) (she indictated hospitalist is not consulted)  Date of Provider Response 10/01/20  Time of Provider Response 2038  Document  Patient Outcome Other (Comment) (will continue to monitor)  per RN

## 2020-10-03 ENCOUNTER — Inpatient Hospital Stay: Payer: Medicare HMO

## 2020-10-03 LAB — CBC
HCT: 40.9 % (ref 39.0–52.0)
Hemoglobin: 12.8 g/dL — ABNORMAL LOW (ref 13.0–17.0)
MCH: 26.3 pg (ref 26.0–34.0)
MCHC: 31.3 g/dL (ref 30.0–36.0)
MCV: 84.2 fL (ref 80.0–100.0)
Platelets: 219 10*3/uL (ref 150–400)
RBC: 4.86 MIL/uL (ref 4.22–5.81)
RDW: 14.9 % (ref 11.5–15.5)
WBC: 14.8 10*3/uL — ABNORMAL HIGH (ref 4.0–10.5)
nRBC: 0 % (ref 0.0–0.2)

## 2020-10-03 LAB — BASIC METABOLIC PANEL
Anion gap: 9 (ref 5–15)
BUN: 9 mg/dL (ref 6–20)
CO2: 24 mmol/L (ref 22–32)
Calcium: 8.2 mg/dL — ABNORMAL LOW (ref 8.9–10.3)
Chloride: 100 mmol/L (ref 98–111)
Creatinine, Ser: 1 mg/dL (ref 0.61–1.24)
GFR calc non Af Amer: >60 mL/min (ref >60)
Glucose, Bld: 104 mg/dL — ABNORMAL HIGH (ref 70–99)
Potassium: 4 mmol/L (ref 3.5–5.1)
Sodium: 133 mmol/L — ABNORMAL LOW (ref 135–145)

## 2020-10-03 LAB — PHOSPHORUS: Phosphorus: 2.6 mg/dL (ref 2.5–4.6)

## 2020-10-03 LAB — SURGICAL PATHOLOGY

## 2020-10-03 LAB — MAGNESIUM: Magnesium: 1.9 mg/dL (ref 1.7–2.4)

## 2020-10-03 NOTE — Progress Notes (Signed)
Patient alert and orient, PRNs given for pain control and any discomfort, continue q2 vitals at this time, will continue to monitor.

## 2020-10-03 NOTE — Evaluation (Signed)
Physical Therapy Evaluation Patient Details Name: Daiel Strohecker III MRN: [id number] DOB: [birth year] Today's Date: 10/03/2020   History of Present Illness  Pt is a 38 y.o. male 2 Day Post-Op s/p robotic assisted sigmoid colectomy for complicated diverticulitis with colovesical fistula, complicated by pertinent comorbidities including COVID+.    Clinical Impression  Pt is a 39 year old M admitted to hospital on 10/01/20 for sigmoid colectomy; pt dx with COVID post surgical intervention. At baseline, pt is independent with ADL's, splits IADL's with spouse/kids, and is limited with community ambulation; pt uses AD PRN due to R knee pain. Pt presents with increased pain, adequate strength, good balance, and good safety awareness. Pt provided grossly supervision for bed mobility, transfers, and short distance ambulation, however, pt able to be mod I with mobility. Pt notes that his mobility is at baseline, and he does not feel that he needs acute care PT or PT at DC. Based on pt presentation today, PT in agreeance. Due to pt being at baseline function, without good safety awareness, he is not an appropriate candidate for skilled acute PT needs at this time. PT recommends DC home with no further PT intervention and intermittent initial supervision for safety.     Follow Up Recommendations No PT follow up;Supervision - Intermittent    Equipment Recommendations  None recommended by PT    Recommendations for Other Services       Precautions / Restrictions Precautions Precautions: Fall Precaution Comments: Air/Con (COVID) Restrictions Weight Bearing Restrictions: No      Mobility  Bed Mobility Overal bed mobility: Modified Independent             General bed mobility comments: Mod I for bed mobility with HOB elevated and use of BUE for support. Increased time/effort due to pain.  Transfers Overall transfer level: Modified independent Equipment used: None              General transfer comment: Mod I to perform STS transfer from recliner and at EOB. BUE for support.  Ambulation/Gait Ambulation/Gait assistance: Supervision   Assistive device: IV Pole       General Gait Details: Supervision for safety and line management. Pt ambulated 67ft in room with unilateral UE support on IV pole. Pt demonstrated reciprocal gait with steady cadence. Pt notes he's been walking independently in room with IV pole. Walking feels at baseline.  Stairs            Wheelchair Mobility    Modified Rankin (Stroke Patients Only)       Balance Overall balance assessment: Modified Independent (With UE support on IV pole for standing balance; Ind without UE support)                                           Pertinent Vitals/Pain Pain Assessment: 0-10 Pain Score: 6  Pain Location: 6/10 LUQ pain from incision/sx Pain Descriptors / Indicators: Sharp Pain Intervention(s): Limited activity within patient's tolerance;Monitored during session;Premedicated before session;Repositioned    Home Living Family/patient expects to be discharged to:: Private residence Living Arrangements: Spouse/significant other;Children Available Help at Discharge: Available PRN/intermittently Type of Home: House Home Access: Stairs to enter Entrance Stairs-Rails: Left Entrance Stairs-Number of Steps: 7 STE L railing Home Layout: One level Home Equipment: Cane - single point;Walker - 2 wheels;Other (comment) Additional Comments: game ready ice machine    Prior Function Level  of Independence: Independent with assistive device(s)         Comments: Pt reports being independent with all ADL's, splitting IADL's with spouse/kids, and being limited with community ambulation. Pt notes using RW/SPC <25% of the time (mainly at night) due to R knee pain and fatigue. Pt requires increased time to perform IADL's due to difficulty from R knee pain.     Hand Dominance         Extremity/Trunk Assessment   Upper Extremity Assessment Upper Extremity Assessment: Overall WFL for tasks assessed    Lower Extremity Assessment Lower Extremity Assessment: Overall WFL for tasks assessed    Cervical / Trunk Assessment Cervical / Trunk Assessment: Normal  Communication   Communication: No difficulties  Cognition Arousal/Alertness: Awake/alert Behavior During Therapy: WFL for tasks assessed/performed Overall Cognitive Status: Within Functional Limits for tasks assessed                                        General Comments      Exercises Other Exercises Other Exercises: Pt demonstrates good safety awareness and balance with forward and backwards ambulation in room. Supervision for safety but able to be mod I with mobility.   Assessment/Plan    PT Assessment Patent does not need any further PT services  PT Problem List         PT Treatment Interventions      PT Goals (Current goals can be found in the Care Plan section)  Acute Rehab PT Goals Patient Stated Goal: to go home PT Goal Formulation: With patient Time For Goal Achievement: 10/17/20 Potential to Achieve Goals: Good    Frequency     Barriers to discharge        Co-evaluation               AM-PAC PT "6 Clicks" Mobility  Outcome Measure Help needed turning from your back to your side while in a flat bed without using bedrails?: None Help needed moving from lying on your back to sitting on the side of a flat bed without using bedrails?: None Help needed moving to and from a bed to a chair (including a wheelchair)?: None Help needed standing up from a chair using your arms (e.g., wheelchair or bedside chair)?: None Help needed to walk in hospital room?: A Little Help needed climbing 3-5 steps with a railing? : A Little 6 Click Score: 22    End of Session Equipment Utilized During Treatment: Gait belt Activity Tolerance: Patient tolerated treatment well;Patient  limited by pain Patient left: in bed;with call bell/phone within reach Nurse Communication: Mobility status PT Visit Diagnosis: Unsteadiness on feet (R26.81)    Time: 2993-7169 PT Time Calculation (min) (ACUTE ONLY): 10 min   Charges:   PT Evaluation $PT Eval Low Complexity: 1 Low         Vira Blanco, PT, DPT 11:55 AM,10/03/20

## 2020-10-03 NOTE — Progress Notes (Signed)
[facility] SUR[facility]ICAL PROGRESS NOTE  Hospital Day(s): 2.   Post op day(s): 2 Days Post-Op.   Interval History:  Patient seen and examined Unfortunately found to have potential COVID exposure pre-operatively and tested positive post-op, mild to minimal cough, no shortness of breath.  No acute events or new complaints overnight.  Reviewed his incisional soreness this morning No nausea, emesis, wants to resume clear liquid diet. Slight bump in leukocytosis post-op stable at 14.8K; fevers 102+ Renal function normal, sCr - 1.00, good UO - 1.7L No electrolyte derangements Endorses flatus Up in chair this morning.  Vital signs in last 24 hours: [min-max] current  Temp:  [98.1 F (36.7 C)-102.1 F (38.9 C)] 98.2 F (36.8 C) (10/08 0434) Pulse Rate:  [94-127] 95 (10/08 0434) Resp:  [19-25] 20 (10/08 0434) BP: (109-147)/(58-82) 125/73 (10/08 0434) SpO2:  [96 %-100 %] 100 % (10/08 0434)     Height: 5\' 6"  (167.6 cm) Weight: 129.7 kg BMI (Calculated): 46.18   Intake/Output last 2 shifts:  10/07 0701 - 10/08 0700 In: -  Out: 1725 [Urine:1725]   Physical Exam:  Constitutional: alert, cooperative and no distress  Respiratory: breathing non-labored at rest, on [place]  Cardiovascular: regular rate and sinus rhythm  Gastrointestinal: Obese, soft, diffuse soreness, and non-distended, no rebound/guarding Integumentary: Laparoscopic incisions are CDI with dermabond, no erythema or drainage.  There is a bit of a mass-effect in the left upper quadrant incision, the area circled of the right upper quadrant incision, shows no evidence of progressive erythema or induration.  Labs:  CBC Latest Ref Rng & Units 10/03/2020 10/02/2020 10/01/2020  WBC 4.0 - 10.5 K/uL 14.8(H) 15.3(H) 14.4(H)  Hemoglobin 13.0 - 17.0 g/dL 12.8(L) 13.5 13.6  Hematocrit 39 - 52 % 40.9 41.1 42.7  Platelets 150 - 400 K/uL 219 281 306   CMP Latest Ref Rng & Units 10/03/2020 10/02/2020 10/01/2020  Glucose 70 - 99 mg/dL  12/01/2020) 481(E) 563(J)  BUN 6 - 20 mg/dL 9 6 5(L)  Creatinine 497(W - 1.24 mg/dL 2.63 7.85 8.85  Sodium 135 - 145 mmol/L 133(L) 140 139  Potassium 3.5 - 5.1 mmol/L 4.0 3.8 3.9  Chloride 98 - 111 mmol/L 100 106 108  CO2 22 - 32 mmol/L 24 27 23   Calcium 8.9 - 10.3 mg/dL 8.2(L) 8.1(L) 8.1(L)  Total Protein 6.5 - 8.1 g/dL - - -  Total Bilirubin 0.3 - 1.2 mg/dL - - -  Alkaline Phos 38 - 126 U/L - - -  AST 15 - 41 U/L - - -  ALT 0 - 44 U/L - - -    Imaging studies:  CLINICAL DATA:  Ileus, abdominal pain, distension, recent sigmoid colectomy  EXAM: ABDOMEN - 1 VIEW  COMPARISON:  None.  FINDINGS: Diffusely gas-filled and distended small bowel, largest loops measuring up to 5.0 cm. Scattered gas present in the proximal colon. Minimal pneumoperitoneum on supine radiographs, in keeping with recent abdominal surgery.  IMPRESSION: Diffusely gas-filled and distended small bowel, largest loops measuring up to 5.0 cm, most likely ileus in the postoperative setting.   Electronically Signed   By: 0.27 M.D.   On: 10/03/2020 09:10   Assessment/Plan: 39 y.o. male 2 Days Post-Op s/p robotic assisted sigmoid colectomy for complicated diverticulitis with colovesical fistula, complicated by pertinent comorbidities including COVID+.   -Resume CLD today; will not advance further for now  - Wean IVF resuscitation   - Continue foley catheter will remove prior to discharge, will leave minimum of 72 hours  postop.  - Pain control prn; antiemetics prn  - Monitor abdominal examination; on-going bowel function   - Monitor leukocytosis    - Mobilize if feasible  - Home medications  - DVT prophylaxis   All of the above findings and recommendations were discussed with the patient, and the medical team, and all of patient's questions were answered to his expressed satisfaction.  -- [name], M.D., [facility] 10/03/2020, 7:35 AM

## 2020-10-03 NOTE — Progress Notes (Addendum)
Pt AAox4, up ad lib,VS stable on room air. HR elevated at times. Pt C/O of abd pain, nausea and 1 episode of vomiting this am. Given alternating prn morphine, tordal and tylenol with relief. Given zofran with minimal effect so phenergan administered. Diet advanced to clear liquid will monitor for toleration. Pt  Passing gas. Will continue to monitor

## 2020-10-03 NOTE — Care Management Important Message (Signed)
Important Message  Patient Details  Name: Jordan [name] MRN: [id number] Date of Birth: 08-Jan-1981   Medicare Important Message Given:  N/A - LOS <3 / Initial given by admissions  Initial Medicare IM given by Patient Access Associate on 10/02/2020 at 11:53am.     [name] 10/03/2020, 12:57 PM

## 2020-10-04 LAB — RESPIRATORY PANEL BY RT PCR (FLU A&B, COVID)
Influenza A by PCR: NEGATIVE
Influenza B by PCR: NEGATIVE
SARS Coronavirus 2 by RT PCR: NEGATIVE

## 2020-10-04 MED ORDER — KETOROLAC TROMETHAMINE 30 MG/ML IJ SOLN
30.0000 mg | Freq: Four times a day (QID) | INTRAMUSCULAR | Status: AC
  Administered 2020-10-04 – 2020-10-05 (×2): 30 mg via INTRAVENOUS
  Filled 2020-10-04 (×2): qty 1

## 2020-10-04 MED ORDER — OXYCODONE-ACETAMINOPHEN 5-325 MG PO TABS
1.0000 | ORAL_TABLET | ORAL | Status: DC | PRN
  Administered 2020-10-04 – 2020-10-05 (×2): 2 via ORAL
  Filled 2020-10-04 (×2): qty 2

## 2020-10-04 NOTE — Progress Notes (Signed)
[facility] SURGICAL PROGRESS NOTE  Hospital Day(s): 3.   Post op day(s): 3 Days Post-Op.   Interval History:  Patient seen and examined, frustrated in Covid unit, wants to be retested. Unfortunately found to have potential COVID exposure pre-operatively and tested positive post-op, denies any ongoing symptoms.  No acute events or new complaints overnight.  Reviewed his incisional soreness this morning, he reports it is improving. Concerned about lack of control with liquid stools when he falls asleep. No nausea, emesis, tolerating clear liquid diet. Slight bump in leukocytosis post-op, last noted at 14.8K; fevers now low-grade. Renal function normal, sCr - 1.00, good UO -volume and clarity noted in bag.   Wants to get Foley out, it has been 72 hours post surgery. No electrolyte derangements Endorses flatus, & stools. Up in chair this afternoon.  Vital signs in last 24 hours: [min-max] current  Temp:  [98.9 F (37.2 C)-100.8 F (38.2 C)] 99.1 F (37.3 C) (10/09 1155) Pulse Rate:  [99-112] 109 (10/09 1155) Resp:  [14-23] 14 (10/09 1155) BP: (135-159)/(72-96) 135/84 (10/09 1155) SpO2:  [97 %-100 %] 98 % (10/09 1155)     Height: 5\' 6"  (167.6 cm) Weight: 129.7 kg BMI (Calculated): 46.18   Intake/Output last 2 shifts:  10/08 0701 - 10/09 0700 In: 240 [P.O.:240] Out: 1 [Stool:1]   Physical Exam:  Constitutional: alert, cooperative and no distress  Respiratory: breathing non-labored at rest, on [place]  Cardiovascular: regular rate and sinus rhythm  Gastrointestinal: Obese, soft, diffuse soreness, and non-distended, no rebound/guarding Integumentary: Laparoscopic incisions are CDI with dermabond, no erythema or drainage.  There is significant diminishment in the regions of his incisions, including the left upper quadrant incision, the area circled of the right upper quadrant incision, all have improved and appear as I would expect postoperatively. Labs:  CBC Latest Ref  Rng & Units 10/03/2020 10/02/2020 10/01/2020  WBC 4.0 - 10.5 K/uL 14.8(H) 15.3(H) 14.4(H)  Hemoglobin 13.0 - 17.0 g/dL 12.8(L) 13.5 13.6  Hematocrit 39 - 52 % 40.9 41.1 42.7  Platelets 150 - 400 K/uL 219 281 306   CMP Latest Ref Rng & Units 10/03/2020 10/02/2020 10/01/2020  Glucose 70 - 99 mg/dL 12/01/2020) 161(W) 960(A)  BUN 6 - 20 mg/dL 9 6 5(L)  Creatinine 540(J - 1.24 mg/dL 8.11 9.14 7.82  Sodium 135 - 145 mmol/L 133(L) 140 139  Potassium 3.5 - 5.1 mmol/L 4.0 3.8 3.9  Chloride 98 - 111 mmol/L 100 106 108  CO2 22 - 32 mmol/L 24 27 23   Calcium 8.9 - 10.3 mg/dL 8.2(L) 8.1(L) 8.1(L)  Total Protein 6.5 - 8.1 g/dL - - -  Total Bilirubin 0.3 - 1.2 mg/dL - - -  Alkaline Phos 38 - 126 U/L - - -  AST 15 - 41 U/L - - -  ALT 0 - 44 U/L - - -     Assessment/Plan: 39 y.o. male 3 Days Post-Op s/p robotic assisted sigmoid colectomy for complicated diverticulitis with colovesical fistula, complicated by pertinent comorbidities including COVID+.   -Advance diet today;   -DC IVF    -Discontinue foley catheter.  - Pain control changed to Percocet; antiemetics prn  - Monitor abdominal examination; on-going bowel function   - Monitor leukocytosis, repeat labs in a.m.   - Mobilize  - Home medications  - DVT prophylaxis  Repeat Covid test if not contraindicated.  All of the above findings and recommendations were discussed with the patient, and the medical team, and all of patient's questions were  answered to his expressed satisfaction.  -- [name], M.D., [facility] 10/04/2020, 3:03 PM

## 2020-10-04 NOTE — Progress Notes (Signed)
Patient complained of pain, PRN pain meds given, will continue to monitor.

## 2020-10-05 ENCOUNTER — Inpatient Hospital Stay: Payer: Medicare HMO

## 2020-10-05 LAB — BASIC METABOLIC PANEL
Anion gap: 6 (ref 5–15)
BUN: 7 mg/dL (ref 6–20)
CO2: 27 mmol/L (ref 22–32)
Calcium: 8.1 mg/dL — ABNORMAL LOW (ref 8.9–10.3)
Chloride: 97 mmol/L — ABNORMAL LOW (ref 98–111)
Creatinine, Ser: 0.87 mg/dL (ref 0.61–1.24)
GFR, Estimated: >60 mL/min (ref >60)
Glucose, Bld: 114 mg/dL — ABNORMAL HIGH (ref 70–99)
Potassium: 3.5 mmol/L (ref 3.5–5.1)
Sodium: 130 mmol/L — ABNORMAL LOW (ref 135–145)

## 2020-10-05 LAB — CBC
HCT: 36.6 % — ABNORMAL LOW (ref 39.0–52.0)
Hemoglobin: 11.6 g/dL — ABNORMAL LOW (ref 13.0–17.0)
MCH: 26.1 pg (ref 26.0–34.0)
MCHC: 31.7 g/dL (ref 30.0–36.0)
MCV: 82.4 fL (ref 80.0–100.0)
Platelets: 253 10*3/uL (ref 150–400)
RBC: 4.44 MIL/uL (ref 4.22–5.81)
RDW: 14.7 % (ref 11.5–15.5)
WBC: 11.4 10*3/uL — ABNORMAL HIGH (ref 4.0–10.5)
nRBC: 0 % (ref 0.0–0.2)

## 2020-10-05 MED ORDER — POLYETHYLENE GLYCOL 3350 17 G PO PACK
17.0000 g | PACK | Freq: Every day | ORAL | Status: DC
  Administered 2020-10-05: 17 g via ORAL
  Filled 2020-10-05: qty 1

## 2020-10-05 MED ORDER — KETOROLAC TROMETHAMINE 30 MG/ML IJ SOLN
30.0000 mg | Freq: Four times a day (QID) | INTRAMUSCULAR | Status: DC
  Administered 2020-10-05 – 2020-10-06 (×2): 30 mg via INTRAVENOUS
  Filled 2020-10-05 (×3): qty 1

## 2020-10-05 MED ORDER — MORPHINE SULFATE (PF) 4 MG/ML IV SOLN
4.0000 mg | INTRAVENOUS | Status: DC | PRN
  Administered 2020-10-05: 4 mg via INTRAVENOUS
  Filled 2020-10-05: qty 1

## 2020-10-05 MED ORDER — OXYCODONE HCL 5 MG PO TABS
10.0000 mg | ORAL_TABLET | ORAL | Status: DC | PRN
  Administered 2020-10-05 – 2020-10-06 (×3): 10 mg via ORAL
  Filled 2020-10-05 (×3): qty 2

## 2020-10-05 NOTE — Progress Notes (Signed)
[name]  Hospital Day(s): 4.   Post op day(s): 4 Days Post-Op.   Interval History: Patient seen and examined, no acute events or new complaints overnight. Patient reports sense of rectal urgency, but somehow was unable to have a bowel movement.  He reported a very small bowel movement previously.  Not surprising since he has had very little solid diet so far. Still quite concerned about pain management, wanting straight oxycodone rather than Percocet.  Tested negative for Covid.  So within 6 days he has had 2 - tests and one positive.  However because of the positive test to remain on the Covid unit during this hospitalization, per nursing supervisor. Has some mild cough while discussing his concerns.  Not masked.  Review of Systems:  Constitutional: denies fever, chills  Respiratory: denies any shortness of breath  Cardiovascular: denies chest pain or palpitations  Gastrointestinal: Endorses along the line of his incisions abdominal pain, denies N/V, or diarrhea/and bowel function as per interval history Musculoskeletal: denies pain, decreased motor or sensation Integumentary: denies any other rashes or skin discolorations  Vital signs in last 24 hours: [min-max] current  Temp:  [98.6 F (37 C)-99.5 F (37.5 C)] 98.6 F (37 C) (10/10 0510) Pulse Rate:  [86-109] 86 (10/10 0510) Resp:  [14-23] 18 (10/10 0510) BP: (135-159)/(79-96) 139/91 (10/10 0510) SpO2:  [97 %-100 %] 99 % (10/10 0510)     Height: 5\' 6"  (167.6 cm) Weight: 129.7 kg BMI (Calculated): 46.18   Intake/Output last 2 shifts:  10/09 0701 - 10/10 0700 In: 840 [P.O.:840] Out: 500 [Urine:500]   Physical Exam:  Constitutional: alert, cooperative and no distress  Respiratory: breathing non-labored at rest, mild nonproductive cough noted Cardiovascular: regular rate and sinus rhythm  Gastrointestinal: soft, non-tender with exception of right upper quadrant/epigastrium, and  non-distended Integumentary: Incisions intact, no evidence of active inflammatory changes or induration.  Labs:  CBC Latest Ref Rng & Units 10/05/2020 10/03/2020 10/02/2020  WBC 4.0 - 10.5 K/uL 11.4(H) 14.8(H) 15.3(H)  Hemoglobin 13.0 - 17.0 g/dL 11.6(L) 12.8(L) 13.5  Hematocrit 39 - 52 % 36.6(L) 40.9 41.1  Platelets 150 - 400 K/uL 253 219 281   CMP Latest Ref Rng & Units 10/05/2020 10/03/2020 10/02/2020  Glucose 70 - 99 mg/dL 12/02/2020) 562(Z) 308(M)  BUN 6 - 20 mg/dL 7 9 6   Creatinine 0.61 - 1.24 mg/dL 578(I 6.96  Sodium 135 - 145 mmol/L 130(L) 133(L) 140  Potassium 3.5 - 5.1 mmol/L 3.5 4.0 3.8  Chloride 98 - 111 mmol/L 97(L) 100 106  CO2 22 - 32 mmol/L 27 24 27   Calcium 8.9 - 10.3 mg/dL 8.1(L) 8.2(L) 8.1(L)  Total Protein 6.5 - 8.1 g/dL - - -  Total Bilirubin 0.3 - 1.2 mg/dL - - -  Alkaline Phos 38 - 126 U/L - - -  AST 15 - 41 U/L - - -  ALT 0 - 44 U/L - - -     Imaging studies: No new pertinent imaging studies   Assessment/Plan:  39 y.o. male with colovesical fistula, 4 Days Post-Op s/p robotic sigmoid colectomy for above, complicated by pertinent comorbidities including .  Patient Active Problem List   Diagnosis Date Noted  . Diverticulitis large intestine 10/01/2020  . Diverticulitis of colon   . History of diverticulitis of colon 09/09/2020  . Colovesical fistula 09/09/2020  . Asthma 10/01/2016  . Morbid obesity (HCC) 02/06/2015  . OSA (obstructive sleep apnea) 10/24/2014  . Anxiety and depression 01/21/2010  .  CHRONIC PAIN SYNDROME 01/21/2010  . HYPERTENSION, MILD 01/21/2010  . Osteoarthritis 01/21/2010    -Per his request will eliminate acetaminophen from his pain medication.  -We will progress diet to regular.  -Continue DVT and GI prophylaxis.  -Mobilize, patient did say he did want to go home sooner than necessary.  All of the above findings and recommendations were discussed with the patient.  However patient's wife called the on-call answering service to  address these issues, she was not present well I was with the patient.  According to the operator, her concerns were also regarding his pain medications.  -- [name], M.D., [facility] 10/05/2020

## 2020-10-05 NOTE — Progress Notes (Signed)
Update given to [name]. Patient complained of pain 6/10 at 1500, meds given 1530, reassessed 1610 patient was resting quietly in room- stated that pain had improved to 4/10. Room air, 100%, BP slightly elevated, all other vitals WNL. Patient refused SCDs at this time.

## 2020-10-06 LAB — BASIC METABOLIC PANEL
Anion gap: 10 (ref 5–15)
BUN: 7 mg/dL (ref 6–20)
CO2: 26 mmol/L (ref 22–32)
Calcium: 8.1 mg/dL — ABNORMAL LOW (ref 8.9–10.3)
Chloride: 98 mmol/L (ref 98–111)
Creatinine, Ser: 0.88 mg/dL (ref 0.61–1.24)
GFR, Estimated: >60 mL/min (ref >60)
Glucose, Bld: 97 mg/dL (ref 70–99)
Potassium: 3.8 mmol/L (ref 3.5–5.1)
Sodium: 134 mmol/L — ABNORMAL LOW (ref 135–145)

## 2020-10-06 LAB — CBC
HCT: 36.5 % — ABNORMAL LOW (ref 39.0–52.0)
Hemoglobin: 11.8 g/dL — ABNORMAL LOW (ref 13.0–17.0)
MCH: 26.5 pg (ref 26.0–34.0)
MCHC: 32.3 g/dL (ref 30.0–36.0)
MCV: 81.8 fL (ref 80.0–100.0)
Platelets: 294 10*3/uL (ref 150–400)
RBC: 4.46 MIL/uL (ref 4.22–5.81)
RDW: 14.8 % (ref 11.5–15.5)
WBC: 11.2 10*3/uL — ABNORMAL HIGH (ref 4.0–10.5)
nRBC: 0 % (ref 0.0–0.2)

## 2020-10-06 MED ORDER — KETOROLAC TROMETHAMINE 30 MG/ML IJ SOLN
30.0000 mg | Freq: Four times a day (QID) | INTRAMUSCULAR | Status: AC
  Administered 2020-10-06 – 2020-10-11 (×14): 30 mg via INTRAVENOUS
  Filled 2020-10-06 (×15): qty 1

## 2020-10-06 MED ORDER — OXYCODONE HCL 5 MG PO TABS
10.0000 mg | ORAL_TABLET | ORAL | Status: DC | PRN
  Administered 2020-10-06 – 2020-10-16 (×50): 10 mg via ORAL
  Filled 2020-10-06 (×53): qty 2

## 2020-10-06 MED ORDER — MORPHINE SULFATE (PF) 4 MG/ML IV SOLN
4.0000 mg | INTRAVENOUS | Status: DC | PRN
  Administered 2020-10-07 – 2020-10-09 (×9): 4 mg via INTRAVENOUS
  Filled 2020-10-06 (×9): qty 1

## 2020-10-06 MED ORDER — FUROSEMIDE 10 MG/ML IJ SOLN
20.0000 mg | Freq: Once | INTRAMUSCULAR | Status: AC
  Administered 2020-10-06: 16:00:00 20 mg via INTRAVENOUS
  Filled 2020-10-06: qty 2

## 2020-10-06 NOTE — Progress Notes (Signed)
Patient alert and oriented, ambulates to BR, pain controlled via PRN pain meds. Complained of nausea, zofran given with improvement. Will continue to monitor.

## 2020-10-06 NOTE — Care Management Important Message (Signed)
Important Message  Patient Details  Name: Couper Juncaj MRN: [id number] Date of Birth: March 26, 1981   Medicare Important Message Given:  Yes  Reviewed verbally with patient via room phone due to isolation status.  Declined copy of Medicare IM at this time as one being mailed to home address by Patient Access.  Patient eager to discharge when medically cleared.     [name] 10/06/2020, 1:44 PM

## 2020-10-06 NOTE — Progress Notes (Signed)
[facility] SURGICAL PROGRESS NOTE  Hospital Day(s): 5.   Post op day(s): 5 Days Post-Op.   Interval History:  Patient seen and examined no acute events or new complaints overnight.  Patient reports he is doing better this morning, has an episode of emesis last night No longer with significant reports of pain, nausea, or emesis He does have some peripheral edema this morning he is concerned over Leukocytosis remains stable at 11.2K, no fevers  Renal function remains normal, sCr - 0.88, UO - 600 ccs + unmeasured amounts No electrolyte derangements  Diet backed down to CLD last night, tolerating well, asking for regular food Endorses multiple episodes of flatus and had BMs Mobilizing in room.   Vital signs in last 24 hours: [min-max] current  Temp:  [98.1 F (36.7 C)-99.7 F (37.6 C)] 98.3 F (36.8 C) (10/11 0427) Pulse Rate:  [81-103] 94 (10/11 0427) Resp:  [17-22] 17 (10/11 0427) BP: (136-153)/(77-98) 144/90 (10/11 0427) SpO2:  [97 %-100 %] 98 % (10/11 0427)     Height: 5\' 6"  (167.6 cm) Weight: 129.7 kg BMI (Calculated): 46.18   Intake/Output last 2 shifts:  10/10 0701 - 10/11 0700 In: -  Out: 600 [Urine:600]   Physical Exam:  Constitutional: alert, cooperative and no distress  Respiratory: breathing non-labored at rest  Cardiovascular: regular rate and sinus rhythm  Gastrointestinal: Soft, no gross tenderness, non-distended, no rebound/guarding Integumentary: Laparoscopic incisions are healing well with dermabond, no erythema or drainage   Labs:  CBC Latest Ref Rng & Units 10/06/2020 10/05/2020 10/03/2020  WBC 4.0 - 10.5 K/uL 11.2(H) 11.4(H) 14.8(H)  Hemoglobin 13.0 - 17.0 g/dL 11.8(L) 11.6(L) 12.8(L)  Hematocrit 39 - 52 % 36.5(L) 36.6(L) 40.9  Platelets 150 - 400 K/uL 294 253 219   CMP Latest Ref Rng & Units 10/06/2020 10/05/2020 10/03/2020  Glucose 70 - 99 mg/dL 97 12/03/2020) 585(I)  BUN 6 - 20 mg/dL 7 7 9   Creatinine 0.61 - 1.24 mg/dL 778(E 4.23   Sodium 135 - 145 mmol/L 134(L) 130(L) 133(L)  Potassium 3.5 - 5.1 mmol/L 3.8 3.5 4.0  Chloride 98 - 111 mmol/L 98 97(L) 100  CO2 22 - 32 mmol/L 26 27 24   Calcium 8.9 - 10.3 mg/dL 8.1(L) 8.1(L) 8.2(L)  Total Protein 6.5 - 8.1 g/dL - - -  Total Bilirubin 0.3 - 1.2 mg/dL - - -  Alkaline Phos 38 - 126 U/L - - -  AST 15 - 41 U/L - - -  ALT 0 - 44 U/L - - -     Imaging studies: No new pertinent imaging studies   Assessment/Plan:  39 y.o. male doing better this morning 5 Days Post-Op s/p robotic assisted sigmoid colectomy for complicated diverticulitis with colovesical fistula, complicated by pertinent comorbidities including COVID+; however, he has since tested negative.    - I will advance to soft diet this afternoon  - Add 1 time dose of Lasix for peripheral edema   - Pain control prn; antiemetics prn             - Monitor abdominal examination; on-going bowel function    - Encouraged mobilization     - Discharge Planning: If tolerates advancement of diet today, we will plan on discharge home tomorrow   All of the above findings and recommendations were discussed with the patient, and the medical team, and all of patient's questions were answered to his expressed satisfaction.  -- 1.44, PA-C [facility] 10/06/2020, 7:41 AM [phone] M-F:  7am - 4pm

## 2020-10-07 ENCOUNTER — Inpatient Hospital Stay: Payer: Medicare HMO

## 2020-10-07 ENCOUNTER — Encounter: Payer: Self-pay | Admitting: Surgery

## 2020-10-07 DIAGNOSIS — T8143XA Infection following a procedure, organ and space surgical site, initial encounter: Secondary | ICD-10-CM

## 2020-10-07 DIAGNOSIS — B9620 Unspecified Escherichia coli [E. coli] as the cause of diseases classified elsewhere: Secondary | ICD-10-CM

## 2020-10-07 LAB — CBC
HCT: 35.6 % — ABNORMAL LOW (ref 39.0–52.0)
Hemoglobin: 11.6 g/dL — ABNORMAL LOW (ref 13.0–17.0)
MCH: 26.2 pg (ref 26.0–34.0)
MCHC: 32.6 g/dL (ref 30.0–36.0)
MCV: 80.5 fL (ref 80.0–100.0)
Platelets: 342 10*3/uL (ref 150–400)
RBC: 4.42 MIL/uL (ref 4.22–5.81)
RDW: 14.7 % (ref 11.5–15.5)
WBC: 12.4 10*3/uL — ABNORMAL HIGH (ref 4.0–10.5)
nRBC: 0 % (ref 0.0–0.2)

## 2020-10-07 MED ORDER — MORPHINE SULFATE (PF) 4 MG/ML IV SOLN
INTRAVENOUS | Status: AC
  Administered 2020-10-07: 14:00:00 4 mg
  Filled 2020-10-07: qty 1

## 2020-10-07 MED ORDER — LIDOCAINE-EPINEPHRINE 1 %-1:100000 IJ SOLN
20.0000 mL | Freq: Once | INTRAMUSCULAR | Status: AC
  Administered 2020-10-07: 14:00:00 20 mL via INTRADERMAL
  Filled 2020-10-07: qty 20

## 2020-10-07 MED ORDER — MORPHINE SULFATE (PF) 4 MG/ML IV SOLN
4.0000 mg | Freq: Once | INTRAVENOUS | Status: DC
  Filled 2020-10-07: qty 1

## 2020-10-07 MED ORDER — IOHEXOL 300 MG/ML  SOLN
125.0000 mL | Freq: Once | INTRAMUSCULAR | Status: AC | PRN
  Administered 2020-10-07: 125 mL via INTRAVENOUS

## 2020-10-07 MED ORDER — PIPERACILLIN-TAZOBACTAM 3.375 G IVPB
3.3750 g | Freq: Three times a day (TID) | INTRAVENOUS | Status: DC
  Administered 2020-10-07 – 2020-10-24 (×49): 3.375 g via INTRAVENOUS
  Filled 2020-10-07 (×54): qty 50

## 2020-10-07 MED ORDER — IOHEXOL 9 MG/ML PO SOLN
500.0000 mL | ORAL | Status: AC
  Administered 2020-10-07 (×2): 500 mL via ORAL

## 2020-10-07 MED ORDER — SODIUM CHLORIDE 0.45 % IV SOLN: INTRAVENOUS | Status: DC

## 2020-10-07 NOTE — Progress Notes (Addendum)
[name] SURGICAL PROGRESS NOTE  Hospital Day(s): 6.   Post op day(s): 6 Days Post-Op.   Interval History:  Patient seen and examined no acute events or new complaints overnight.  Patient reports he is not feeling good this morning and notes abdominal discomfort intermittently in his lower abdomen. This has come and gone intermittently today.  He continues to notice erythema surround one of his laparoscopic sites which has not improved, Abx from 10/07 had been discontinued.  He has some nausea but no emesis He is passing flatus and endorse multiple liquid BMs in the last 24 hours.  No fevers Leukocytosis slightly worse this morning to 12.4K No other labs this morning He underwent CT Abdomen/Pelvis this morning which was concerning for anastomotic leak.   Vital signs in last 24 hours: [min-max] current  Temp:  [98.7 F (37.1 C)-99.9 F (37.7 C)] 99.3 F (37.4 C) (10/12 1255) Pulse Rate:  [96-108] 108 (10/12 1255) Resp:  [16-20] 16 (10/12 1255) BP: (134-155)/(77-95) 139/95 (10/12 1255) SpO2:  [97 %-99 %] 99 % (10/12 1255)     Height: 5\' 6"  (167.6 cm) Weight: 129.7 kg BMI (Calculated): 46.18   Intake/Output last 2 shifts:  10/11 0701 - 10/12 0700 In: -  Out: 600 [Urine:600]   Physical Exam:  Constitutional: alert, cooperative and no distress  Respiratory: breathing non-labored at rest  Cardiovascular: regular rate and sinus rhythm  Gastrointestinal: Soft, he is tender to his suprapubic region, non-distended, no rebound/guarding. Integumentary: The middle right laparoscopic incision is markedly erythematous and indurated, I removed the skin glue and was able to express purulent drainage. I will formally I&D this at bedside. The remaining 3 incisions appear CDI with dermabond no erythema or drainage.   Labs:  CBC Latest Ref Rng & Units 10/07/2020 10/06/2020 10/05/2020  WBC 4.0 - 10.5 K/uL 12.4(H) 11.2(H) 11.4(H)  Hemoglobin 13.0 - 17.0 g/dL 11.6(L) 11.8(L)  11.6(L)  Hematocrit 39 - 52 % 35.6(L) 36.5(L) 36.6(L)  Platelets 150 - 400 K/uL 342 294 253   CMP Latest Ref Rng & Units 10/06/2020 10/05/2020 10/03/2020  Glucose 70 - 99 mg/dL 97 12/03/2020) 409(W)  BUN 6 - 20 mg/dL 7 7 9   Creatinine 0.61 - 1.24 mg/dL 119(J 4.78  Sodium 135 - 145 mmol/L 134(L) 130(L) 133(L)  Potassium 3.5 - 5.1 mmol/L 3.8 3.5 4.0  Chloride 98 - 111 mmol/L 98 97(L) 100  CO2 22 - 32 mmol/L 26 27 24   Calcium 8.9 - 10.3 mg/dL 8.1(L) 8.1(L) 8.2(L)  Total Protein 6.5 - 8.1 g/dL - - -  Total Bilirubin 0.3 - 1.2 mg/dL - - -  Alkaline Phos 38 - 126 U/L - - -  AST 15 - 41 U/L - - -  ALT 0 - 44 U/L - - -     Imaging studies:   CT Abdomen/Pelvis (10/07/2020) personally reviewed which shows frank anastomotic leak with associated pneumoperitoneum, no gross abscess, also noted to have subcutaneous emphysema however this does not appear to correlate directly with concerning examination findings regarding his incisions, and radiologist report reviewed below:  IMPRESSION: 1. Postoperative findings of sigmoid colon resection and reanastomosis. There is a frank defect at the right aspect of the anastomosis with air and fluid tracking superiorly into the central small bowel mesentery. Although very elongated and irregular in shape, the largest, most central component of air and fluid in the central small bowel mesentery measures at least 13.3 x 4.8 cm. 2. The small bowel is diffusely fluid-filled without a  distinctly identified transition point and there is gas present throughout the colon to the rectum. There are thickened loops of small bowel adjacent to air and fluid within the mesentery, likely reactive to adjacent infection and overall findings most consistent with ileus. 3. Trace free fluid in the low abdomen and pelvis in addition to minimal pneumoperitoneum. There is subcutaneous emphysema about the anterior left hemiabdomen and a small volume of emphysema about the lower  esophagus. Findings are generally in keeping with recent laparoscopy.   Assessment/Plan:  39 y.o. male now with surgical wound infection and subclinical anastomotic leak 6 Days Post-Op s/p robotic assisted sigmoid colectomyfor complicated diverticulitis with colovesical fistula, complicated by pertinent comorbidities includingCOVID+; however, he has since tested negative.    - Pending clinical condition, we can always consider percutaneous drain placement  - I will preform bedside I&D of his surgical wound infection; please refer to procedure note for details of this  - Will restart IV ABx (Zosyn); follow up Cx obtained during I&D  - Back down to NPO; restart IVF  - Monitor abdominal examination closely   - Monitor leukocytosis; fever curve  - No emergent surgical re-intervention; potentially may need diverting loop ileostomy should clinical condition worsen   - Pain control prn; antiemetics prn  - Mobilization encouraged  - Home medications   All of the above findings and recommendations were discussed with the patient, and the medical team, and all of patient's questions were answered to his expressed satisfaction.  -- Lynden Oxford, PA-C [facility] 10/07/2020, 1:43 PM [phone] M-F: 7am - 4pm

## 2020-10-07 NOTE — Procedures (Signed)
PROCEDURE NOTE  Date: 10/07/20 1:55 PM   Procedure: Incision and Drainage  Preforming Provider: Lynden Oxford, PA-C  Pre-Procedure Diagnosis: Post-operative Wound Infection   Post-Procedure Diagnosis: Post-operative Wound Infection   Anesthesia: 17 ccs of 1% Lidocaine with epinephrine  Findings: Gross purulent drainage  Details of procedure: Indications, risks, benefits, and alternatives to the above procedure were discussed with the patient in detail and consent obtained. There area was prepped and draped in standard sterile fashion. 17 ccs of 1% lidocaine with epinephrine were injected intra-dermally and adequate anesthesia was achieved. Using an 11-blade knife an incision was made over his previous laparoscopic incision and purulence was immediately expressed. This was cultures. Forceps were used to break up any remaining loculation. The wound was irrigated with copious amount of NS. This was then packed with 1/2 inch iodoform gauze and covered with gauze and tape. The patient tolerated this well without complications.    EBL: Minimal, <5ccs  Complications: None apparent  -- Lynden Oxford, PA-C [facility] 10/07/2020, 1:57 PM [phone] M-F: 7am - 4pm

## 2020-10-07 NOTE — Plan of Care (Signed)
  Problem: Education: Goal: Knowledge of risk factors and measures for prevention of condition will improve Outcome: Progressing   Problem: Coping: Goal: Psychosocial and spiritual needs will be supported Outcome: Progressing   Problem: Respiratory: Goal: Will maintain a patent airway Outcome: Progressing Goal: Complications related to the disease process, condition or treatment will be avoided or minimized Outcome: Progressing   

## 2020-10-08 LAB — BASIC METABOLIC PANEL
Anion gap: 11 (ref 5–15)
BUN: <5 mg/dL — ABNORMAL LOW (ref 6–20)
CO2: 26 mmol/L (ref 22–32)
Calcium: 8.3 mg/dL — ABNORMAL LOW (ref 8.9–10.3)
Chloride: 95 mmol/L — ABNORMAL LOW (ref 98–111)
Creatinine, Ser: 0.78 mg/dL (ref 0.61–1.24)
GFR, Estimated: >60 mL/min (ref >60)
Glucose, Bld: 111 mg/dL — ABNORMAL HIGH (ref 70–99)
Potassium: 3.9 mmol/L (ref 3.5–5.1)
Sodium: 132 mmol/L — ABNORMAL LOW (ref 135–145)

## 2020-10-08 LAB — CBC
HCT: 37.3 % — ABNORMAL LOW (ref 39.0–52.0)
Hemoglobin: 11.9 g/dL — ABNORMAL LOW (ref 13.0–17.0)
MCH: 25.9 pg — ABNORMAL LOW (ref 26.0–34.0)
MCHC: 31.9 g/dL (ref 30.0–36.0)
MCV: 81.1 fL (ref 80.0–100.0)
Platelets: 404 10*3/uL — ABNORMAL HIGH (ref 150–400)
RBC: 4.6 MIL/uL (ref 4.22–5.81)
RDW: 15.1 % (ref 11.5–15.5)
WBC: 15.5 10*3/uL — ABNORMAL HIGH (ref 4.0–10.5)
nRBC: 0 % (ref 0.0–0.2)

## 2020-10-08 NOTE — Progress Notes (Signed)
Initial Nutrition Assessment  DOCUMENTATION CODES:   Morbid obesity  INTERVENTION:   RD will monitor for diet advancement vs the need for nutrition support  Recommend TPN if unable to advance diet in the next 1-2 days  NUTRITION DIAGNOSIS:   Inadequate oral intake related to acute illness as evidenced by NPO status.  GOAL:   Patient will meet greater than or equal to 90% of their needs  MONITOR:   Diet advancement, Labs, Weight trends, Skin, I & O's  REASON FOR ASSESSMENT:   NPO/Clear Liquid Diet    ASSESSMENT:   39 y/o male with h/o anxiety, depression, chronic pain syndrome, HTN, OSA, morbid obesity, kidney stones, asthma and diverticulitis who is s/p robotic assisted sigmoid colectomy for complicated diverticulitis 10/6 with colovesical fistula, complicated by COVID 19 infection and anastomotic leak   Pt s/p incisional I & D 10/12  10/6- pt NPO 10/7- pt advanced to clears but then later made NPO 10/8- pt advanced back to clears 10/9- pt advanced to full liquid diet 10/10- pt vomited once overnight and put back to clears- pt was able to advance to soft diet later than evening 10/12- pt with anastomotic leak- made NPO   Pt has remained mainly on NPO/clear liquid diet since admit. Pt currently NPO for large anastomotic dehiscence which is being conservatively managed at this time. Pt eating well at times when he is allowed to eat. Last documented intake was 100% on 10/9. Would recommend consideration of TPN if unable to advance pt's diet in the next 1-2 days. Pt is likely at moderate refeed risk. Per chart, pt appears fairly weight stable at baseline; however, pt's last four documented weights are exactly the same and appear to be reported. Pt has not been weighed in hospital so difficult to determine if any significant weight changes. Pt is mobilizing in room; may be able to get a standing weight.   Medications reviewed and include: heparin, protonix, NaCl @100ml /hr,  zosyn   Labs reviewed: Na 132(L), BUN <5(L) P 2.6 wnl, Mg 1.9 wnl- 10/8 Wbc- 15.5(H)  NUTRITION - FOCUSED PHYSICAL EXAM: Unable to perform at this time   Diet Order:   Diet Order            Diet NPO time specified Except for: Ice Chips, Sips with Meds  Diet effective now                EDUCATION NEEDS:   No education needs have been identified at this time  Skin:  Skin Assessment: Reviewed RN Assessment (incision abdomen)  Last BM:  10/12- type 7  Height:   Ht Readings from Last 1 Encounters:  10/01/20 5\' 6"  (1.676 m)    Weight:   Wt Readings from Last 1 Encounters:  10/01/20 129.7 kg    Ideal Body Weight:  64.5 kg  BMI:  Body mass index is 46.16 kg/m.  Estimated Nutritional Needs:   Kcal:  2900-3200kcal/day  Protein:  130-145g/day  Fluid:  2-2.3L/day  MS, RD, LDN Please refer to [facility] for RD and/or RD on-call/weekend/after hours pager

## 2020-10-08 NOTE — Progress Notes (Signed)
[facility] SUR[facility]ICAL PROGRESS NOTE  Hospital Day(s): 7.   Post op day(s): 7 Days Post-Op.   Interval History:  Patient seen and examined no acute events or new complaints overnight.  Patient reports he continues to have suprapubic sharp pains intermittently, he is still passing gas No nausea, emesis Labs are pending Cx from I&D on 10/12 and pending; growing gram (-) rods He remains on Zosyn NPO + IVF resuscitation Mobilizing in room  Vital signs in last 24 hours: [min-max] current  Temp:  [97.9 F (36.6 C)-99.3 F (37.4 C)] 98.2 F (36.8 C) (10/13 0409) Pulse Rate:  [90-108] 91 (10/13 0409) Resp:  [16-20] 16 (10/13 0409) BP: (124-162)/(67-95) 124/67 (10/13 0409) SpO2:  [97 %-99 %] 99 % (10/13 0409)     Height: 5\' 6"  (167.6 cm) Weight: 129.7 kg BMI (Calculated): 46.18   Intake/Output last 2 shifts:  10/12 0701 - 10/13 0700 In: 277.7 [I.V.:271.7; IV Piggyback:6] Out: 450 [Urine:450]   Physical Exam:  Constitutional: alert, cooperative and no distress  Respiratory: breathing non-labored at rest  Cardiovascular: regular rate and sinus rhythm  Gastrointestinal: Soft, he is tender to his suprapubic region, non-distended, no rebound/guarding. Integumentary: The middle right laparoscopic incision is s/o I&D, packing in place, surrounding skin is erythematous but improved, still with induration, no purulence present nor expresible. The remaining 3 incisions appear CDI with dermabond no erythema or drainage.   Labs:  CBC Latest Ref Rng & Units 10/07/2020 10/06/2020 10/05/2020  WBC 4.0 - 10.5 K/uL 12.4(H) 11.2(H) 11.4(H)  Hemoglobin 13.0 - 17.0 g/dL 11.6(L) 11.8(L) 11.6(L)  Hematocrit 39 - 52 % 35.6(L) 36.5(L) 36.6(L)  Platelets 150 - 400 K/uL 342 294 253   CMP Latest Ref Rng & Units 10/06/2020 10/05/2020 10/03/2020  Glucose 70 - 99 mg/dL 97 12/03/2020) 756(E)  BUN 6 - 20 mg/dL 7 7 9   Creatinine 0.61 - 1.24 mg/dL 332(R 5.18  Sodium 135 - 145 mmol/L 134(L) 130(L)  133(L)  Potassium 3.5 - 5.1 mmol/L 3.8 3.5 4.0  Chloride 98 - 111 mmol/L 98 97(L) 100  CO2 22 - 32 mmol/L 26 27 24   Calcium 8.9 - 10.3 mg/dL 8.1(L) 8.1(L) 8.2(L)  Total Protein 6.5 - 8.1 g/dL - - -  Total Bilirubin 0.3 - 1.2 mg/dL - - -  Alkaline Phos 38 - 126 U/L - - -  AST 15 - 41 U/L - - -  ALT 0 - 44 U/L - - -     Imaging studies: No new pertinent imaging studies   Assessment/Plan:  39 y.o. male with relatively subclinical anastomotic leak 7 Days Post-Op s/p robotic assisted sigmoid colectomyfor complicated diverticulitis with colovesical fistula, complicated by pertinent comorbidities includingCOVID+; however, he has since tested negative.   - Continue NPO, IVF,   - Continue IV ABx (Zosyn); follow up Cx obtained during I&D   - Continue wound care: remove packing daily, replace, secure with gauze. This can be done PRN as well  - Monitor abdominal examination closely              - Monitor leukocytosis; fever curve             - No emergent surgical re-intervention; potentially may need diverting loop ileostomy should clinical condition worsen, additional options include drain placement with IR if feasible             - Pain control prn; antiemetics prn             - Mobilization encouraged             -  Home medications   All of the above findings and recommendations were discussed with the patient, and the medical team, and all of patient's questions were answered to his expressed satisfaction.  -- Lynden Oxford, PA-C [facility] 10/08/2020, 7:08 AM [phone] M-F: 7am - 4pm

## 2020-10-09 ENCOUNTER — Inpatient Hospital Stay: Payer: Self-pay

## 2020-10-09 LAB — BASIC METABOLIC PANEL
Anion gap: 10 (ref 5–15)
BUN: 7 mg/dL (ref 6–20)
CO2: 27 mmol/L (ref 22–32)
Calcium: 8 mg/dL — ABNORMAL LOW (ref 8.9–10.3)
Chloride: 90 mmol/L — ABNORMAL LOW (ref 98–111)
Creatinine, Ser: 0.87 mg/dL (ref 0.61–1.24)
GFR, Estimated: >60 mL/min (ref >60)
Glucose, Bld: 85 mg/dL (ref 70–99)
Potassium: 3.6 mmol/L (ref 3.5–5.1)
Sodium: 127 mmol/L — ABNORMAL LOW (ref 135–145)

## 2020-10-09 LAB — CBC
HCT: 33.7 % — ABNORMAL LOW (ref 39.0–52.0)
Hemoglobin: 11 g/dL — ABNORMAL LOW (ref 13.0–17.0)
MCH: 26.3 pg (ref 26.0–34.0)
MCHC: 32.6 g/dL (ref 30.0–36.0)
MCV: 80.4 fL (ref 80.0–100.0)
Platelets: 405 10*3/uL — ABNORMAL HIGH (ref 150–400)
RBC: 4.19 MIL/uL — ABNORMAL LOW (ref 4.22–5.81)
RDW: 14.6 % (ref 11.5–15.5)
WBC: 15.9 10*3/uL — ABNORMAL HIGH (ref 4.0–10.5)
nRBC: 0 % (ref 0.0–0.2)

## 2020-10-09 LAB — MAGNESIUM: Magnesium: 1.9 mg/dL (ref 1.7–2.4)

## 2020-10-09 LAB — PHOSPHORUS: Phosphorus: 3.6 mg/dL (ref 2.5–4.6)

## 2020-10-09 MED ORDER — INSULIN ASPART 100 UNIT/ML ~~LOC~~ SOLN
0.0000 [IU] | Freq: Three times a day (TID) | SUBCUTANEOUS | Status: DC
  Filled 2020-10-09: qty 1

## 2020-10-09 MED ORDER — SODIUM CHLORIDE 0.9 % IV SOLN: INTRAVENOUS | Status: AC

## 2020-10-09 MED ORDER — SODIUM CHLORIDE 0.9% FLUSH: 10.0000 mL | INTRAVENOUS | Status: DC | PRN

## 2020-10-09 MED ORDER — IOHEXOL 9 MG/ML PO SOLN: 500.0000 mL | ORAL | Status: DC

## 2020-10-09 MED ORDER — CHLORHEXIDINE GLUCONATE CLOTH 2 % EX PADS
6.0000 | MEDICATED_PAD | Freq: Every day | CUTANEOUS | Status: DC
  Administered 2020-10-09 – 2020-10-23 (×15): 6 via TOPICAL

## 2020-10-09 MED ORDER — HYDROMORPHONE HCL 1 MG/ML IJ SOLN
0.5000 mg | INTRAMUSCULAR | Status: DC | PRN
  Administered 2020-10-09 – 2020-10-15 (×15): 1 mg via INTRAVENOUS
  Filled 2020-10-09 (×17): qty 1

## 2020-10-09 MED ORDER — TRACE MINERALS CU-MN-SE-ZN 300-55-60-3000 MCG/ML IV SOLN
INTRAVENOUS | Status: AC
  Filled 2020-10-09: qty 468

## 2020-10-09 MED ORDER — CHLORHEXIDINE GLUCONATE CLOTH 2 % EX PADS: 6.0000 | MEDICATED_PAD | Freq: Every day | CUTANEOUS | Status: DC

## 2020-10-09 NOTE — TOC Initial Note (Signed)
Transition of Care [facility]) - Initial/Assessment Note    Patient Details  Name: Jordan [name] MRN: [id number] Date of Birth: November 23, 1981  [facility] CM/SW Contact:    Allayne Butcher, RN Phone Number: 10/09/2020, 3:24 PM  Clinical Narrative:                 Patient admitted to the hospital for a sigmoid colectomy for colovesical fistula.  Patient tested positive after surgery for COVID.  RNCM was able to speak with patient via phone.  Patient reports that "I feel like I've been through the ringer".  Patient has developed a anastomotic leak and needs to start TPN for an unknown amount of time.  Patient had his PICC line placed today for the TPN.  Patient is also currently on IV antibiotics.    Patient is from home with his wife and 2 children.  Patient is mostly independent at home but he has trouble with his knees and he cannot always do the things he would like to be able to.  Patient is current with his PCP and uses [facility] for prescriptions.  Discharge disposition to be determined pending clinical progression.  TOC team will anticipate home health needs, patient is okay with home health services being provided at time of discharge.  Patient also asked about Meal delivery from [facility] at discharge.  Patient has the meal delivery benefit once he is discharged he can call [employer] at [phone] to schedule meal delivery for 2 weeks post discharge 2 meals per day- total of 14 meals.  Unsure if patient will be able to tolerate food at discharge he may need to be NPO for an extended period of time and have TPN.    TOC team will cont to follow as patient progresses.   Expected Discharge Plan: Home w Home Health Services Barriers to Discharge: Continued Medical Work up   Patient Goals and CMS Choice Patient states their goals for this hospitalization and ongoing recovery are:: Patient wants to get well, he wants his stomach to heal and he wants to be able to go  home. CMS Medicare.gov Compare Post Acute Care list provided to:: Patient Choice offered to / list presented to : Patient  Expected Discharge Plan and Services Expected Discharge Plan: Home w Home Health Services   Discharge Planning Services: CM Consult Post Acute Care Choice: Home Health Living arrangements for the past 2 months: Single Family Home                                      Prior Living Arrangements/Services Living arrangements for the past 2 months: Single Family Home Lives with:: Spouse Patient language and need for interpreter reviewed:: Yes Do you feel safe going back to the place where you live?: Yes      Need for Family Participation in Patient Care: Yes (Comment) (colovesical fistula, post of illeus, post op complicaitons) Care giver support system in place?: Yes (comment) Current home services: DME (walker and cane) Criminal Activity/Legal Involvement Pertinent to Current Situation/Hospitalization: No - Comment as needed  Activities of Daily Living Home Assistive Devices/Equipment: None ADL Screening (condition at time of admission) Patient's cognitive ability adequate to safely complete daily activities?: Yes Is the patient deaf or have difficulty hearing?: No Does the patient have difficulty seeing, even when wearing glasses/contacts?: No Does the patient have difficulty concentrating, remembering, or making  decisions?: No Patient able to express need for assistance with ADLs?: Yes Does the patient have difficulty dressing or bathing?: No Independently performs ADLs?: Yes (appropriate for developmental age) Does the patient have difficulty walking or climbing stairs?: No Weakness of Legs: Both Weakness of Arms/Hands: None  Permission Sought/Granted Permission sought to share information with : Case Manager, Family Supports Permission granted to share information with : Yes, Verbal Permission Granted  Share Information with NAME:  [name]  Permission granted to share info w AGENCY: Home health agency of choice  Permission granted to share info w Relationship: wife     Emotional Assessment   Attitude/Demeanor/Rapport: Engaged Affect (typically observed): Accepting Orientation: : Oriented to Self, Oriented to Place, Oriented to  Time, Oriented to Situation Alcohol / Substance Use: Not Applicable Psych Involvement: No (comment)  Admission diagnosis:  Diverticulitis large intestine [K57.32] Patient Active Problem List   Diagnosis Date Noted  . Diverticulitis large intestine 10/01/2020  . Diverticulitis of colon   . History of diverticulitis of colon 09/09/2020  . Colovesical fistula 09/09/2020  . Asthma 10/01/2016  . Morbid obesity (HCC) 02/06/2015  . OSA (obstructive sleep apnea) 10/24/2014  . Anxiety and depression 01/21/2010  . CHRONIC PAIN SYNDROME 01/21/2010  . HYPERTENSION, MILD 01/21/2010  . Osteoarthritis 01/21/2010   PCP:  Mayer Masker, PA-C Pharmacy:   [facility] - [place], [place] - [address] [place] [address] Phone: [phone] Fax: [phone]  [facility] [address], [place] - [address] [place] [address] Phone: [phone] Fax: [phone]  [id number] - [place], [place] - [address] [place] [address] Phone: [phone] Fax: [phone]     Social Determinants of Health (SDOH) Interventions    Readmission Risk Interventions No flowsheet data found.

## 2020-10-09 NOTE — Progress Notes (Signed)
[facility] SURGICAL PROGRESS NOTE  Hospital Day(s): 8.   Post op day(s): 8 Days Post-Op.   Interval History:  Patient seen and examined no acute events or new complaints overnight.  Patient reports that his abdominal pain has actually improved some, now only with mild very sporadic pains in his suprapubic region His bigger complaints are being tired this morning No nausea or emesis He actually reports having a large BM and flatus this morning Leukocytosis stabilized, WBC 15.9K Renal function remains normal, sCr - 0.87, UO - 650 ccs + unmeasured Hyponatremia to 127, o/w no significant electrolyte derangements  Cx from I&D on 10/12 and pending; growing gram (-) rods He remains on Zosyn  Vital signs in last 24 hours: [min-max] current  Temp:  [98.3 F (36.8 C)-99 F (37.2 C)] 98.4 F (36.9 C) (10/14 0459) Pulse Rate:  [83-97] 83 (10/14 0459) Resp:  [18-20] 18 (10/14 0459) BP: (120-156)/(79-134) 156/92 (10/14 0459) SpO2:  [95 %-100 %] 100 % (10/14 0459)     Height: 5\' 6"  (167.6 cm) Weight: 129.7 kg BMI (Calculated): 46.18   Intake/Output last 2 shifts:  10/13 0701 - 10/14 0700 In: 1999.4 [I.V.:1853.7; IV Piggyback:145.8] Out: 650 [Urine:650]   Physical Exam:  Constitutional: alert, cooperative and no distress  Respiratory: breathing non-labored at rest  Cardiovascular: regular rate and sinus rhythm  Gastrointestinal:Soft, tenderness in the suprapubic region is improved some, non-distended, no rebound/guarding. Certainly without overt peritonitis  Integumentary:The middle right laparoscopic incision is s/o I&D, packing in place, surrounding skin is erythematous but improved, still with induration, no purulence present nor expresible. The remaining 3 incisions appear CDI with dermabond no erythema or drainage.  Labs:  CBC Latest Ref Rng & Units 10/09/2020 10/08/2020 10/07/2020  WBC 4.0 - 10.5 K/uL 15.9(H) 15.5(H) 12.4(H)  Hemoglobin 13.0 - 17.0 g/dL 11.0(L)  11.9(L) 11.6(L)  Hematocrit 39 - 52 % 33.7(L) 37.3(L) 35.6(L)  Platelets 150 - 400 K/uL 405(H) 404(H) 342   CMP Latest Ref Rng & Units 10/09/2020 10/08/2020 10/06/2020  Glucose 70 - 99 mg/dL 85 12/06/2020) 97  BUN 6 - 20 mg/dL 7 062(I) 7  Creatinine <9(S - 1.24 mg/dL 8.54 6.27 0.35  Sodium 135 - 145 mmol/L 127(L) 132(L) 134(L)  Potassium 3.5 - 5.1 mmol/L 3.6 3.9 3.8  Chloride 98 - 111 mmol/L 90(L) 95(L) 98  CO2 22 - 32 mmol/L 27 26 26   Calcium 8.9 - 10.3 mg/dL 8.0(L) 8.3(L) 8.1(L)  Total Protein 6.5 - 8.1 g/dL - - -  Total Bilirubin 0.3 - 1.2 mg/dL - - -  Alkaline Phos 38 - 126 U/L - - -  AST 15 - 41 U/L - - -  ALT 0 - 44 U/L - - -     Imaging studies: No new pertinent imaging studies   Assessment/Plan:  39 y.o. male with somewhat subclinical anastomotic leak 8 Days Post-Op s/p robotic assisted sigmoid colectomyfor complicated diverticulitis with colovesical fistula, complicated by pertinent comorbidities includingCOVID+; however, he has since tested negative   - We will plan on repeating CT Abdomen/Pelvis tomorrow morning to reassess his level of leak and possible exacerbation of any intra-abdominal process(es). Should his clinical condition change, we will purse this sooner.   - I will also initiate TPN today given the lack of PO intake post-operatively, and the possibility he will be NPO for a prolonged period of time.    - Continue NPO, IVF             - Continue IV ABx (  Zosyn); follow up Cx obtained during I&D              - Continue wound care: remove packing daily, replace, secure with gauze. This can be done PRN as well             - Monitor abdominal examination closely - Monitor leukocytosis; fever curve - No emergent surgical re-intervention; potentially may need anastomotic takedown/colostomy -vs- diverting loop ileostomy should clinical condition worsen, additional options include drain placement with IR if feasible - Pain control prn;  antiemetics prn - Mobilization encouraged - Home medications   All of the above findings and recommendations were discussed with the patient, and the medical team, and all of patient's questions were answered to his expressed satisfaction.  -- Lynden Oxford, PA-C [facility] 10/09/2020, 7:09 AM [phone] M-F: 7am - 4pm

## 2020-10-09 NOTE — Consult Note (Signed)
PHARMACY - TOTAL PARENTERAL NUTRITION CONSULT NOTE   Indication: Prolonged NPO  Patient Measurements: Height: 5\' 6"  (167.6 cm) Weight: 129.7 kg (286 lb) IBW/kg (Calculated) : 63.8 TPN AdjBW (KG): 80.3 Body mass index is 46.16 kg/m.  Assessment:  Patient is a 39 y/o M with medical history including morbid obesity, hypertension, diverticulitis who is POD # 8 from sigmoid colectomy for colovesical fistula. Post-operative course complicated by anastomotic dehiscence and surgical wound infection. Pharmacy has been consulted to initiate TPN for anticipated prolonged NPO status.   Glucose / Insulin: No history of diabetes. Normoglycemic.  Electrolytes: Hyponatremia, hypochloremia. Suspect due to low solute intake / hypotonic fluids Renal: Scr < 1 (stable and c/w baseline) LFTs / TGs: LFTs within normal limits on 10/4. No data regarding TG yet.  Prealbumin / albumin: Albumin 3.7 on 10/4 Intake / Output; MIVF: 650 mL UOP documented, one episode of emesis, and one bowel movement documented 10/13.  GI Imaging: 10/12 CT Abdomen/Pelvis: 1. Postoperative findings of sigmoid colon resection and reanastomosis. There is a frank defect at the right aspect of the anastomosis with air and fluid tracking superiorly into the central small bowel mesentery. Although very elongated and irregular in shape, the largest, most central component of air and fluid in the central small bowel mesentery measures at least 13.3 x 4.8 cm. 2. The small bowel is diffusely fluid-filled without a distinctly identified transition point and there is gas present throughout the colon to the rectum. There are thickened loops of small bowel adjacent to air and fluid within the mesentery, likely reactive to adjacent infection and overall findings most consistent with ileus. 3. Trace free fluid in the low abdomen and pelvis in addition to minimal pneumoperitoneum. There is subcutaneous emphysema about the anterior left hemiabdomen and a  small volume of emphysema about the lower esophagus. Findings are generally in keeping with recent laparoscopy.  Surgeries / Procedures:  10/6 Robotic assisted sigmoid colectomy 10/12 I&D of post-operative wound infection  Central access: 10/14 (pending placement) TPN start date: 10/14 after PICC placement  Nutritional Goals (per RD recommendation on 10/13): kCal: 2900-3200, Protein: 130-145g, Fluid: 2 - 2.3L Goal TPN rate is 90 mL/hr (provides 140 g of protein and 3000 kcals per day)  Current Nutrition:  NPO  Plan:  --Start TPN at 1/2 goal rate of 45 mL/hr at 1800 --Electrolytes in TPN: 43mEq/L of Na, 17mEq/L of K, 57mEq/L of Ca, 78mEq/L of Mg, and 41mmol/L of Phos. Cl:Ac ratio 1:1 --Add standard MVI and trace elements to TPN, thiamine 100 mg x 3 days --Initiate Sensitive q8h SSI and adjust as needed  --Reduce MIVF to 55 mL/hr at 1800 --Monitor TPN labs on Mon/Thurs, will monitor BMP, Mg, Phos x 3 days given risk for re-feeding  12m 10/09/2020,9:46 AM

## 2020-10-09 NOTE — Progress Notes (Signed)
Peripherally Inserted Central Catheter Placement  The IV Nurse has discussed with the patient and/or persons authorized to consent for the patient, the purpose of this procedure and the potential benefits and risks involved with this procedure.  The benefits include less needle sticks, lab draws from the catheter, and the patient may be discharged home with the catheter. Risks include, but not limited to, infection, bleeding, blood clot (thrombus formation), and puncture of an artery; nerve damage and irregular heartbeat and possibility to perform a PICC exchange if needed/ordered by physician.  Alternatives to this procedure were also discussed.  Bard Power PICC patient education guide, fact sheet on infection prevention and patient information card has been provided to patient /or left at bedside.    PICC Placement Documentation  PICC Double Lumen 10/09/20 PICC Right Basilic 41 cm 0 cm (Active)  Indication for Insertion or Continuance of Line Administration of hyperosmolar/irritating solutions (i.e. TPN, Vancomycin, etc.) 10/09/20 1304  Exposed Catheter (cm) 0 cm 10/09/20 1304  Site Assessment Clean;Dry;Intact 10/09/20 1304  Lumen #1 Status Flushed;Blood return noted;Saline locked 10/09/20 1304  Lumen #2 Status Flushed;Blood return noted;Saline locked 10/09/20 1304  Dressing Type Transparent 10/09/20 1304  Dressing Status Clean;Intact;Dry 10/09/20 1304  Antimicrobial disc in place? Yes 10/09/20 1304  Dressing Change Due 10/16/20 10/09/20 1304       Audrie Gallus 10/09/2020, 1:06 PM

## 2020-10-10 ENCOUNTER — Inpatient Hospital Stay: Payer: Medicare HMO

## 2020-10-10 LAB — AEROBIC/ANAEROBIC CULTURE W GRAM STAIN (SURGICAL/DEEP WOUND)

## 2020-10-10 LAB — COMPREHENSIVE METABOLIC PANEL
ALT: 17 U/L (ref 0–44)
AST: 17 U/L (ref 15–41)
Albumin: 2.6 g/dL — ABNORMAL LOW (ref 3.5–5.0)
Alkaline Phosphatase: 98 U/L (ref 38–126)
Anion gap: 9 (ref 5–15)
BUN: 6 mg/dL (ref 6–20)
CO2: 28 mmol/L (ref 22–32)
Calcium: 8.2 mg/dL — ABNORMAL LOW (ref 8.9–10.3)
Chloride: 95 mmol/L — ABNORMAL LOW (ref 98–111)
Creatinine, Ser: 0.65 mg/dL (ref 0.61–1.24)
GFR, Estimated: >60 mL/min (ref >60)
Glucose, Bld: 126 mg/dL — ABNORMAL HIGH (ref 70–99)
Potassium: 3.3 mmol/L — ABNORMAL LOW (ref 3.5–5.1)
Sodium: 132 mmol/L — ABNORMAL LOW (ref 135–145)
Total Bilirubin: 0.9 mg/dL (ref 0.3–1.2)
Total Protein: 6.4 g/dL — ABNORMAL LOW (ref 6.5–8.1)

## 2020-10-10 LAB — GLUCOSE, CAPILLARY
Glucose-Capillary: 114 mg/dL — ABNORMAL HIGH (ref 70–99)
Glucose-Capillary: 135 mg/dL — ABNORMAL HIGH (ref 70–99)
Glucose-Capillary: 95 mg/dL (ref 70–99)

## 2020-10-10 LAB — DIFFERENTIAL
Abs Immature Granulocytes: 0.55 10*3/uL — ABNORMAL HIGH (ref 0.00–0.07)
Basophils Absolute: 0.1 10*3/uL (ref 0.0–0.1)
Basophils Relative: 0 %
Eosinophils Absolute: 0.3 10*3/uL (ref 0.0–0.5)
Eosinophils Relative: 2 %
Immature Granulocytes: 5 %
Lymphocytes Relative: 14 %
Lymphs Abs: 1.7 10*3/uL (ref 0.7–4.0)
Monocytes Absolute: 0.5 10*3/uL (ref 0.1–1.0)
Monocytes Relative: 4 %
Neutro Abs: 8.8 10*3/uL — ABNORMAL HIGH (ref 1.7–7.7)
Neutrophils Relative %: 75 %

## 2020-10-10 LAB — MAGNESIUM: Magnesium: 2.1 mg/dL (ref 1.7–2.4)

## 2020-10-10 LAB — CBC
HCT: 34.3 % — ABNORMAL LOW (ref 39.0–52.0)
Hemoglobin: 11.2 g/dL — ABNORMAL LOW (ref 13.0–17.0)
MCH: 26.4 pg (ref 26.0–34.0)
MCHC: 32.7 g/dL (ref 30.0–36.0)
MCV: 80.7 fL (ref 80.0–100.0)
Platelets: 429 10*3/uL — ABNORMAL HIGH (ref 150–400)
RBC: 4.25 MIL/uL (ref 4.22–5.81)
RDW: 14.6 % (ref 11.5–15.5)
WBC: 11.9 10*3/uL — ABNORMAL HIGH (ref 4.0–10.5)
nRBC: 0 % (ref 0.0–0.2)

## 2020-10-10 LAB — TRIGLYCERIDES: Triglycerides: 156 mg/dL — ABNORMAL HIGH (ref <150)

## 2020-10-10 LAB — PHOSPHORUS: Phosphorus: 3.3 mg/dL (ref 2.5–4.6)

## 2020-10-10 LAB — PREALBUMIN: Prealbumin: 5.7 mg/dL — ABNORMAL LOW (ref 18–38)

## 2020-10-10 MED ORDER — MIDAZOLAM HCL 2 MG/2ML IJ SOLN
INTRAMUSCULAR | Status: DC | PRN
  Administered 2020-10-10 (×2): 1 mg via INTRAVENOUS

## 2020-10-10 MED ORDER — FENTANYL CITRATE (PF) 100 MCG/2ML IJ SOLN
INTRAMUSCULAR | Status: AC
Start: 2020-10-10 — End: 2020-10-11
  Filled 2020-10-10: qty 2

## 2020-10-10 MED ORDER — SODIUM CHLORIDE 0.9 % IV SOLN: INTRAVENOUS | Status: DC

## 2020-10-10 MED ORDER — TRACE MINERALS CU-MN-SE-ZN 300-55-60-3000 MCG/ML IV SOLN
INTRAVENOUS | Status: AC
  Filled 2020-10-10: qty 936

## 2020-10-10 MED ORDER — MIDAZOLAM HCL 2 MG/2ML IJ SOLN
INTRAMUSCULAR | Status: AC
  Filled 2020-10-10: qty 2

## 2020-10-10 MED ORDER — IOHEXOL 9 MG/ML PO SOLN
500.0000 mL | ORAL | Status: AC
  Administered 2020-10-10 (×2): 500 mL via ORAL

## 2020-10-10 MED ORDER — IOHEXOL 300 MG/ML  SOLN
125.0000 mL | Freq: Once | INTRAMUSCULAR | Status: AC | PRN
  Administered 2020-10-10: 12:00:00 125 mL via INTRAVENOUS

## 2020-10-10 MED ORDER — FENTANYL CITRATE (PF) 100 MCG/2ML IJ SOLN
INTRAMUSCULAR | Status: DC | PRN
  Administered 2020-10-10 (×2): 50 ug via INTRAVENOUS

## 2020-10-10 NOTE — Procedures (Signed)
Interventional Radiology Procedure:   Indications: Anastomosis leak with free air  Procedure: CT guided drain in left lower quadrant air-fluid collection  Findings: 12 Fr drain placed. 5 ml of cloudy yellow fluid removed along with air.   Complications: None     EBL: less than 10 ml  Plan: Send fluid for culture and follow output.     Herson Prichard R. Lowella Dandy, MD  Pager: [phone]

## 2020-10-10 NOTE — Consult Note (Signed)
Chief Complaint: Diverticulitis s/p Sigmoid colectomy with colovesical fistula. Found to have an anastomotic fistula with LLQ abscess. Request is for intra abdominal abscess drain placement.  Referring Physician(s): Yves Dill PA  Supervising Physician: Richarda Overlie  Patient Status: [facility] - In-pt  History of Present Illness: Jordan [name] is a 39 y.o. male History of HTN, Morbid obesity, diverticulitis with colovesical fistula s/p sigmoid colectomy. Hospital stay complicated by a post operative wound infection with I & D performed at bedside on 10.12.21 and a dehiscence of the anastomosis. CT abd pelvis from 10.15.21 reads Fluid in gas can be seen tracking from the dehiscence site superiorly into the lower abdomen, image 73/2. The volume of extraluminal gas anastomosis appears decreased when compared with 10/07/2020. The previously identified collection of extraluminal gas and fluid within the lower abdomen is again noted and appears decreased in volume from previous exam the largest collection is in the left lower quadrant measuring 4.3 x 4.9 cm. Team is requesting LLQ abscess drain placement.  Past Medical History:  Diagnosis Date  . Anxiety and depression   . Asthma   . Chronic pain syndrome   . History of kidney stones   . HYPERTENSION, MILD   . OSTEOARTHRITIS   . Recurrent UTI   . Sleep apnea     Past Surgical History:  Procedure Laterality Date  . COLONOSCOPY WITH PROPOFOL N/A 09/30/2020   Procedure: COLONOSCOPY WITH PROPOFOL;  Surgeon: Midge Minium, MD;  Location: [facility] ENDOSCOPY;  Service: Endoscopy;  Laterality: N/A;  . JOINT REPLACEMENT    . KNEE SURGERY Right    7 total surgeries  . REPLACEMENT TOTAL KNEE Right     Allergies: Butrans [buprenorphine hcl], Nucynta [tapentadol hydrochloride], Tapentadol hcl, Glucose polymer, Sumac, and Bactrim [sulfamethoxazole-trimethoprim]  Medications: Prior to Admission medications   Medication Sig Start Date End  Date Taking? Authorizing Provider  albuterol (VENTOLIN HFA) 108 (90 Base) MCG/ACT inhaler Inhale 2 puffs into the lungs every 6 (six) hours as needed for wheezing or shortness of breath. 06/27/19  Yes Sharlene Dory, DO  aspirin 81 MG tablet Take 81 mg by mouth daily.    Yes [provider]  cetirizine (ZYRTEC) 10 MG tablet Take 1 tablet (10 mg total) by mouth daily. 07/02/20  Yes Abonza, Maritza, PA-C  phenazopyridine (PYRIDIUM) 95 MG tablet Take 95 mg by mouth in the morning and at bedtime.    Yes [provider]  ciprofloxacin (CIPRO) 500 MG tablet Take 1 tablet (500 mg total) by mouth every 12 (twelve) hours. Patient not taking: Reported on 09/30/2020 09/12/20   Harle Battiest, PA-C     Family History  Problem Relation Age of Onset  . Heart disease Father   . Heart attack Father   . Diabetes Father   . High Cholesterol Father   . High blood pressure Father   . Heart disease Paternal Grandfather   . High Cholesterol Paternal Grandfather   . Heart disease Paternal Grandmother   . Heart attack Paternal Grandmother   . Diabetes Paternal Grandmother   . Heart disease Paternal Uncle   . Cancer Paternal Uncle   . Heart attack Paternal Uncle   . Diabetes Paternal Uncle   . High Cholesterol Paternal Uncle   . Diabetes Paternal Aunt   . Hypertension Maternal Uncle   . Cancer Maternal Aunt        lung  . Cancer Maternal Uncle        lung  . Depression  Mother   . Dementia Mother     Social History   Socioeconomic History  . Marital status: Married    Spouse name: Designer, television/film set  . Number of children: 2  . Years of education: some college  . Highest education level: Not on file  Occupational History    Employer: UNEMPLOYED    Comment: Disabled  Tobacco Use  . Smoking status: Former Smoker    Years: 2.00    Types: Cigars    Quit date: 01/18/2014    Years since quitting: 6.7  . Smokeless tobacco: Never Used  Vaping Use  . Vaping Use: Never used    Substance and Sexual Activity  . Alcohol use: Yes    Alcohol/week: 13.0 standard drinks    Types: 7 Standard drinks or equivalent, 6 Cans of beer per week  . Drug use: Yes    Frequency: 7.0 times per week    Types: Marijuana    Comment: 09/28/20   . Sexual activity: Yes  Other Topics Concern  . Not on file  Social History Narrative   Caffeine Use:  6 beverages daily   Regular exercise:  2 x weekly         Social Determinants of Health   Financial Resource Strain:   . Difficulty of Paying Living Expenses: Not on file  Food Insecurity:   . Worried About Programme researcher, broadcasting/film/video in the Last Year: Not on file  . Ran Out of Food in the Last Year: Not on file  Transportation Needs:   . Lack of Transportation (Medical): Not on file  . Lack of Transportation (Non-Medical): Not on file  Physical Activity:   . Days of Exercise per Week: Not on file  . Minutes of Exercise per Session: Not on file  Stress:   . Feeling of Stress : Not on file  Social Connections:   . Frequency of Communication with Friends and Family: Not on file  . Frequency of Social Gatherings with Friends and Family: Not on file  . Attends Religious Services: Not on file  . Active Member of Clubs or Organizations: Not on file  . Attends Banker Meetings: Not on file  . Marital Status: Not on file     Review of Systems: A 12 point ROS discussed and pertinent positives are indicated in the HPI above.  All other systems are negative.  Review of Systems  Constitutional: Negative for fever.  HENT: Negative for congestion.   Respiratory: Negative for cough and shortness of breath.   Cardiovascular: Negative for chest pain.  Gastrointestinal: Negative for abdominal pain ( right flank).  Genitourinary: Positive for urgency.  Neurological: Negative for headaches.  Psychiatric/Behavioral: Negative for behavioral problems and confusion.    Vital Signs: BP (!) 151/98 (BP Location: Left Arm)   Pulse 88    Temp 98.9 F (37.2 C) (Oral)   Resp 20   Ht 5\' 6"  (1.676 m)   Wt 286 lb (129.7 kg)   SpO2 100%   BMI 46.16 kg/m   Physical Exam Vitals and nursing note reviewed.  Constitutional:      Appearance: He is well-developed.  HENT:     Head: Normocephalic.  Cardiovascular:     Rate and Rhythm: Normal rate and regular rhythm.     Heart sounds: Normal heart sounds.  Pulmonary:     Effort: Pulmonary effort is normal.     Breath sounds: Normal breath sounds.  Musculoskeletal:  General: Normal range of motion.     Cervical back: Normal range of motion.  Skin:    General: Skin is dry.  Neurological:     Mental Status: He is alert and oriented to person, place, and time.     Imaging: DG Abd 1 View  Result Date: 10/03/2020 CLINICAL DATA:  Ileus, abdominal pain, distension, recent sigmoid colectomy EXAM: ABDOMEN - 1 VIEW COMPARISON:  None. FINDINGS: Diffusely gas-filled and distended small bowel, largest loops measuring up to 5.0 cm. Scattered gas present in the proximal colon. Minimal pneumoperitoneum on supine radiographs, in keeping with recent abdominal surgery. IMPRESSION: Diffusely gas-filled and distended small bowel, largest loops measuring up to 5.0 cm, most likely ileus in the postoperative setting. Electronically Signed   By: Lauralyn Primes M.D.   On: 10/03/2020 09:10   CT ABDOMEN PELVIS W CONTRAST  Result Date: 10/10/2020 CLINICAL DATA:  Evaluate for abdominal abscess. Status post sigmoid colectomy in found to have an anastomotic leak. EXAM: CT ABDOMEN AND PELVIS WITH CONTRAST TECHNIQUE: Multidetector CT imaging of the abdomen and pelvis was performed using the standard protocol following bolus administration of intravenous contrast. CONTRAST:  OMNIPAQUE IOHEXOL 300 MG/ML  SOLN COMPARISON:  10/07/2020 FINDINGS: Lower chest: No acute abnormality. Hepatobiliary: No focal liver abnormality is seen. No gallstones, gallbladder wall thickening, or biliary dilatation. Pancreas:  Unremarkable. No pancreatic ductal dilatation or surrounding inflammatory changes. Spleen: Normal in size without focal abnormality. Adrenals/Urinary Tract: Normal appearance of the adrenal glands. No kidney mass or hydronephrosis identified. Urinary bladder is unremarkable. Stomach/Bowel: Stomach appears nondistended. Multiple dilated loops of small bowel with air-fluid levels. There is enteric contrast material within the small and large bowel loops. Rectosigmoid anastomosis is noted within the central pelvis, image 80/2. Again seen are signs of anastomotic dehiscence extraluminal gas adjacent to the suture line. Fluid in gas can be seen tracking from the dehiscence site superiorly into the lower abdomen, image 73/2. The volume of extraluminal gas anastomosis appears decreased when compared with 10/07/2020. The previously identified collection of extraluminal gas and fluid within the lower abdomen is again noted and appears decreased in volume from previous exam the largest collection is in the left lower quadrant measuring 4.3 x 4.9 cm. Vascular/Lymphatic: No significant vascular findings are present. No enlarged abdominal or pelvic lymph nodes. Prominent retroperitoneal and pelvic lymph nodes are favored to reflect reactive adenopathy. Reproductive: Prostate is unremarkable. Other: Small volume of free fluid noted within the pelvis and upper abdomen. Pneumoperitoneum is identified. When compared with the previous exam this is increased in volume. Musculoskeletal: No acute or significant osseous findings. IMPRESSION: 1. Signs of persistent anastomotic dehiscence of status post sigmoid resection. Extraluminal collection of gas and fluid associated with dehiscence appears decreased in volume from previous exam. However, there is been an increase in volume of pneumoperitoneum. 2. Dilated loops of small bowel noted which are favored to represent ileus. Electronically Signed   By: Signa Kell M.D.   On: 10/10/2020  12:21   CT ABDOMEN PELVIS W CONTRAST  Result Date: 10/07/2020 CLINICAL DATA:  Status post sigmoid colectomy EXAM: CT ABDOMEN AND PELVIS WITH CONTRAST TECHNIQUE: Multidetector CT imaging of the abdomen and pelvis was performed using the standard protocol following bolus administration of intravenous contrast. CONTRAST:  OMNIPAQUE IOHEXOL 300 MG/ML  SOLN COMPARISON:  CT abdomen pelvis, 05/04/2020 FINDINGS: Lower chest: No acute abnormality. Hepatobiliary: No solid liver abnormality is seen. No gallstones, gallbladder wall thickening, or biliary dilatation. Pancreas: Unremarkable. No pancreatic ductal  dilatation or surrounding inflammatory changes. Spleen: Normal in size without significant abnormality. Adrenals/Urinary Tract: Adrenal glands are unremarkable. Kidneys are normal, without renal calculi, solid lesion, or hydronephrosis. Bladder is unremarkable. Stomach/Bowel: Stomach is within normal limits. Postoperative findings of sigmoid colon resection and reanastomosis. There is a frank defect at the right aspect of the anastomosis with air and fluid tracking superiorly into the central small bowel mesentery (series 2, image 78, series 5, image 80). Although very elongated and irregular in shape, the largest, most central component of air and fluid in the central small bowel mesentery measures at least 13.3 x 4.8 cm (series 2, image 72). The small bowel is diffusely fluid-filled without a distinctly identified transition point and there is gas present throughout the colon to the rectum. There are thickened loops of small bowel adjacent to air and fluid within the mesentery (series 2, image 66). Diverticula of the remaining descending and sigmoid colon. Vascular/Lymphatic: No significant vascular findings are present. No enlarged abdominal or pelvic lymph nodes. Reproductive: No mass or other significant abnormality. Other: No abdominal wall hernia or abnormality. There is trace free fluid in the low  abdomen and pelvis in addition to minimal pneumoperitoneum. There is subcutaneous emphysema about the anterior left hemiabdomen and a small volume of emphysema about the lower esophagus. Musculoskeletal: No acute or significant osseous findings. IMPRESSION: 1. Postoperative findings of sigmoid colon resection and reanastomosis. There is a frank defect at the right aspect of the anastomosis with air and fluid tracking superiorly into the central small bowel mesentery. Although very elongated and irregular in shape, the largest, most central component of air and fluid in the central small bowel mesentery measures at least 13.3 x 4.8 cm. 2. The small bowel is diffusely fluid-filled without a distinctly identified transition point and there is gas present throughout the colon to the rectum. There are thickened loops of small bowel adjacent to air and fluid within the mesentery, likely reactive to adjacent infection and overall findings most consistent with ileus. 3. Trace free fluid in the low abdomen and pelvis in addition to minimal pneumoperitoneum. There is subcutaneous emphysema about the anterior left hemiabdomen and a small volume of emphysema about the lower esophagus. Findings are generally in keeping with recent laparoscopy. These results will be called to the ordering clinician or representative by the Radiologist Assistant, and communication documented in the PACS or [employer]. Electronically Signed   By: Lauralyn Primes M.D.   On: 10/07/2020 11:50   DG Abd 2 Views  Result Date: 10/05/2020 CLINICAL DATA:  Postoperative ileus EXAM: ABDOMEN - 2 VIEW COMPARISON:  10/03/2020 FINDINGS: There is distention of multiple loops of small bowel measuring up to approximately 5.4 cm in diameter. There is no definite pneumatosis or free air. Gas is seen to the level of the patient's rectum. There is no definite radiopaque kidney stone. IMPRESSION: Findings are consistent with a postoperative ileus or small bowel  obstruction. Electronically Signed   By: Katherine Mantle M.D.   On: 10/05/2020 20:51   [place] EKG SITE RITE  Result Date: 10/09/2020 If Site Rite image not attached, placement could not be confirmed due to current cardiac rhythm.   Labs:  CBC: Recent Labs    10/07/20 0530 10/08/20 0854 10/09/20 0536 10/10/20 0513  WBC 12.4* 15.5* 15.9* 11.9*  HGB 11.6* 11.9* 11.0* 11.2*  HCT 35.6* 37.3* 33.7* 34.3*  PLT 342 404* 405* 429*    COAGS: No results for input(s): INR, APTT in the last 8760  hours.  BMP: Recent Labs    05/04/20 1513 05/04/20 1513 09/29/20 0901 10/01/20 0728 10/06/20 0443 10/08/20 0854 10/09/20 0536 10/10/20 0513  NA 137   < > 140   < > 134* 132* 127* 132*  K 3.5   < > 3.3*   < > 3.8 3.9 3.6 3.3*  CL 103   < > 104   < > 98 95* 90* 95*  CO2 25   < > 25   < > 26 26 27 28   GLUCOSE 126*   < > 104*   < > 97 111* 85 126*  BUN 5*   < > 8   < > 7 <5* 7 6  CALCIUM 8.7*   < > 8.8*   < > 8.1* 8.3* 8.0* 8.2*  CREATININE 0.90   < > 0.72   < > 0.88 0.78 0.87 0.65  GFRNONAA >60   < > >60   < > >60 >60 >60 >60  GFRAA >60  --  >60  --   --   --   --   --    < > = values in this interval not displayed.    LIVER FUNCTION TESTS: Recent Labs    09/29/20 0901 10/10/20 0513  BILITOT 0.7 0.9  AST 19 17  ALT 15 17  ALKPHOS 74 98  PROT 7.0 6.4*  ALBUMIN 3.7 2.6*    Assessment and Plan:  39 y.o. male inpatient. History of HTN, Morbid obesity, diverticulitis with colovesical fistula s/p sigmoid colectomy. Hospital stay complicated by a post operative wound infection with I & D performed at bedside on 10.12.21 and a dehiscence of the anastomosis. CT abd pelvis from 10.15.21 reads Fluid in gas can be seen tracking from the dehiscence site superiorly into the lower abdomen, image 73/2. The volume of extraluminal gas anastomosis appears decreased when compared with 10/07/2020. The previously identified collection of extraluminal gas and fluid within the lower abdomen is again  noted and appears decreased in volume from previous exam the largest collection is in the left lower quadrant measuring 4.3 x 4.9 cm. Team is requesting LLQ abscess drain placement.  Patient was found to be COVID + post op. WBC is 11.9, afebrile. Potassium 3.3. Cultures shows e.coli. Patient is on subcutaneous prophylactic dose of heparin. All other labs and medications are within acceptable parameters.  Patient is on TPN via PICC line.  IR consulted for possible LLQ abscess drain placement. Case has been reviewed and procedure approved by Dr. Jacqualine CodeA. Henn.  Patient tentatively scheduled for 10.15.21.  Team instructed to: Keep Patient to be NPO after midnight Hold prophylactic anticoagulation 24 hours prior to scheduled procedure.  IR will call patient when ready.  Risks and benefits discussed with the patient including bleeding, infection, damage to adjacent structures, bowel perforation/fistula connection, and sepsis.  All of the patient's questions were answered, patient is agreeable to proceed.  Verbal consent obtained and in chart.    Thank you for this interesting consult.  I greatly enjoyed meeting Randel PiggFleming Timothy Morning [name] and look forward to participating in their care.  A copy of this report was sent to the requesting provider on this date.  Electronically Signed: Alene MiresJennifer C Hartlyn Reigel, NP 10/10/2020, 1:02 PM   I spent a total of 40 Minutes    in face to face in clinical consultation, greater than 50% of which was counseling/coordinating care for LLQ abscess drain placement.

## 2020-10-10 NOTE — Consult Note (Signed)
PHARMACY - TOTAL PARENTERAL NUTRITION CONSULT NOTE   Indication: Prolonged NPO  Patient Measurements: Height: 5\' 6"  (167.6 cm) Weight: 129.7 kg (286 lb) IBW/kg (Calculated) : 63.8 TPN AdjBW (KG): 80.3 Body mass index is 46.16 kg/m.  Assessment:  Patient is a 39 y/o M with medical history including morbid obesity, hypertension, diverticulitis who is POD # 8 from sigmoid colectomy for colovesical fistula. Post-operative course complicated by anastomotic dehiscence and surgical wound infection. Pharmacy has been consulted to initiate TPN for anticipated prolonged NPO status.   Glucose / Insulin: No history of diabetes. Normoglycemic.  Electrolytes: Hyponatremia, hypochloremia. Suspect due to low solute intake / hypotonic fluids. Both improving today. Mild hypokalemia. Renal: Scr < 1 (stable and c/w baseline) LFTs / TGs: LFTs within normal limits. No data regarding TG yet.  Prealbumin / albumin: Albumin 3.7 on 10/4 Intake / Output; MIVF: 2500 mL UOP documented 10/14.  GI Imaging: 10/12 CT Abdomen/Pelvis: 1. Postoperative findings of sigmoid colon resection and reanastomosis. There is a frank defect at the right aspect of the anastomosis with air and fluid tracking superiorly into the central small bowel mesentery. Although very elongated and irregular in shape, the largest, most central component of air and fluid in the central small bowel mesentery measures at least 13.3 x 4.8 cm. 2. The small bowel is diffusely fluid-filled without a distinctly identified transition point and there is gas present throughout the colon to the rectum. There are thickened loops of small bowel adjacent to air and fluid within the mesentery, likely reactive to adjacent infection and overall findings most consistent with ileus. 3. Trace free fluid in the low abdomen and pelvis in addition to minimal pneumoperitoneum. There is subcutaneous emphysema about the anterior left hemiabdomen and a small volume of emphysema  about the lower esophagus. Findings are generally in keeping with recent laparoscopy.  Surgeries / Procedures:  10/6 Robotic assisted sigmoid colectomy 10/12 I&D of post-operative wound infection  Central access: 10/14  TPN start date: 10/14   Nutritional Goals (per RD recommendation on 10/13): kCal: 2900-3200, Protein: 130-145g, Fluid: 2 - 2.3L Goal TPN rate is 90 mL/hr (provides 140 g of protein and 3000 kcals per day)  Current Nutrition:  NPO  Plan:  -Advance TPN to goal rate of 90 mL/hr at 1800 --Electrolytes in TPN: 44mEq/L of Na, 47mEq/L of K, 15mEq/L of Ca, 11mEq/L of Mg, and 66mmol/L of Phos. Cl:Ac ratio 1:1 --Add standard MVI and trace elements to TPN, thiamine 100 mg x 3 days --Continue Sensitive q8h SSI and adjust as needed  --Reduce MIVF to 10 mL/hr at 1800 --Monitor TPN labs on Mon/Thurs, will monitor BMP, Mg, Phos x 3 days given risk for re-feeding  12m 10/10/2020,7:14 AM

## 2020-10-10 NOTE — Progress Notes (Signed)
Patient clinically stable post Abscess Drain placement per Dr Lowella Dandy, tolerated well with 12FR drain placed, dressing dry and intact, post procedure/recovery taken back to room with report given to [facility] Rn on 1C with questions answered. Denies complaints at this time. Received Versed 2 mg along with Fentanyl IV for procedure.

## 2020-10-10 NOTE — Progress Notes (Signed)
Requested ordered TPN from pharmacy

## 2020-10-10 NOTE — Progress Notes (Signed)
[facility] SURGICAL PROGRESS NOTE  Hospital Day(s): 9.   Post op day(s): 9 Days Post-Op.   Interval History:  Patient seen and examined no acute events or new complaints overnight.  Patient reports he still has intermittent lower abdominal pain, still improved from earlier in the week No nausea or emesis Leukocytosis actually improved this morning, down to 11.9K Renal function remains normal, scr - 0.65, UO - 2.5L Mild hypokalemia to 3.3, hyponatremia improved to 132, no other significant electrolyte derangements  He is pending CT this morning to reassess anastomotic leak Wound Cx from 10/12 growing E coli, susceptibilities pending; remains on Zosyn NPO; on TPN  Vital signs in last 24 hours: [min-max] current  Temp:  [98 F (36.7 C)-98.9 F (37.2 C)] 98 F (36.7 C) (10/14 2336) Pulse Rate:  [89-94] 91 (10/14 2336) Resp:  [17-18] 17 (10/14 2336) BP: (137-155)/(82-95) 140/85 (10/14 2336) SpO2:  [96 %-100 %] 99 % (10/14 2336)     Height: 5\' 6"  (167.6 cm) Weight: 129.7 kg BMI (Calculated): 46.18   Intake/Output last 2 shifts:  10/14 0701 - 10/15 0700 In: 223 [I.V.:41.1; IV Piggyback:181.9] Out: 2500 [Urine:2500]   Physical Exam:  Constitutional: alert, cooperative and no distress  Respiratory: breathing non-labored at rest  Cardiovascular: regular rate and sinus rhythm  Gastrointestinal:Soft, tenderness in the suprapubic region is improved some, non-distended, no rebound/guarding. Certainly without overt peritonitis  Integumentary:The middle right laparoscopic incision iss/o I&D, packing in place, surrounding skin is erythematous but improved, still with induration, we were able to express additional purulence from the medial aspect of the wound this morning.The remaining 3 incisions appear CDI with dermabond no erythema or drainage.  Labs:  CBC Latest Ref Rng & Units 10/10/2020 10/09/2020 10/08/2020  WBC 4.0 - 10.5 K/uL 11.9(H) 15.9(H) 15.5(H)   Hemoglobin 13.0 - 17.0 g/dL 11.2(L) 11.0(L) 11.9(L)  Hematocrit 39 - 52 % 34.3(L) 33.7(L) 37.3(L)  Platelets 150 - 400 K/uL 429(H) 405(H) 404(H)   CMP Latest Ref Rng & Units 10/10/2020 10/09/2020 10/08/2020  Glucose 70 - 99 mg/dL 10/10/2020) 85 768(T)  BUN 6 - 20 mg/dL 6 7 157(W)  Creatinine <6(O - 1.24 mg/dL 0.35 5.97 4.16  Sodium 135 - 145 mmol/L 132(L) 127(L) 132(L)  Potassium 3.5 - 5.1 mmol/L 3.3(L) 3.6 3.9  Chloride 98 - 111 mmol/L 95(L) 90(L) 95(L)  CO2 22 - 32 mmol/L 28 27 26   Calcium 8.9 - 10.3 mg/dL 8.2(L) 8.0(L) 8.3(L)  Total Protein 6.5 - 8.1 g/dL 6.4(L) - -  Total Bilirubin 0.3 - 1.2 mg/dL 0.9 - -  Alkaline Phos 38 - 126 U/L 98 - -  AST 15 - 41 U/L 17 - -  ALT 0 - 44 U/L 17 - -     Imaging studies:   CT Abdomen/Pelvis (10/10/2020) pending   Assessment/Plan:  39 y.o. male  9 Days Post-Op s/p robotic assisted sigmoid colectomyfor complicated diverticulitis with colovesical fistula, complicated by pertinent comorbidities includingCOVID+; however, he has since tested negative   - So far his clinical progression is encouraging. His leukocytosis and abdominal discomforts have improved over the course of the last 24 hours and he continues to have bowel function. Although not usual for this degree of anastomotic leak, we will continue these conservative measures as he has shown no clinical evidence of deterioration and has actually made improvements. We will await results of CT Abdomen/Pelvis this morning to determine next steps in clinical course.    - ContinueNPO, IVF - ContinueIV ABx (Zosyn); follow up  Cx obtained during I&D --> growing E coli, susceptibilities pending - Continue wound care: remove packing daily, replace, secure with gauze. This can be done PRN as well - Monitor abdominal examination closely - Monitor leukocytosis; fever curve - No emergent surgical re-intervention; potentially may need  anastomotic takedown/colostomy -vs- diverting loop ileostomy should clinical condition worsen, additional options include drain placement with IR if feasible - Pain control prn; antiemetics prn - Mobilization encouraged - Home medications   All of the above findings and recommendations were discussed with the patient, and the medical team, and all of patient's questions were answered to his expressed satisfaction.  -- Lynden Oxford, PA-C [facility] 10/10/2020, 7:10 AM [phone] M-F: 7am - 4pm

## 2020-10-11 LAB — GLUCOSE, CAPILLARY
Glucose-Capillary: 123 mg/dL — ABNORMAL HIGH (ref 70–99)
Glucose-Capillary: 135 mg/dL — ABNORMAL HIGH (ref 70–99)

## 2020-10-11 LAB — MAGNESIUM: Magnesium: 2.1 mg/dL (ref 1.7–2.4)

## 2020-10-11 LAB — BASIC METABOLIC PANEL
Anion gap: 10 (ref 5–15)
BUN: 7 mg/dL (ref 6–20)
CO2: 27 mmol/L (ref 22–32)
Calcium: 8.2 mg/dL — ABNORMAL LOW (ref 8.9–10.3)
Chloride: 100 mmol/L (ref 98–111)
Creatinine, Ser: 0.68 mg/dL (ref 0.61–1.24)
GFR, Estimated: >60 mL/min (ref >60)
Glucose, Bld: 121 mg/dL — ABNORMAL HIGH (ref 70–99)
Potassium: 3.8 mmol/L (ref 3.5–5.1)
Sodium: 137 mmol/L (ref 135–145)

## 2020-10-11 LAB — CBC
HCT: 35.2 % — ABNORMAL LOW (ref 39.0–52.0)
Hemoglobin: 11.1 g/dL — ABNORMAL LOW (ref 13.0–17.0)
MCH: 25.6 pg — ABNORMAL LOW (ref 26.0–34.0)
MCHC: 31.5 g/dL (ref 30.0–36.0)
MCV: 81.3 fL (ref 80.0–100.0)
Platelets: 520 10*3/uL — ABNORMAL HIGH (ref 150–400)
RBC: 4.33 MIL/uL (ref 4.22–5.81)
RDW: 14.9 % (ref 11.5–15.5)
WBC: 13.3 10*3/uL — ABNORMAL HIGH (ref 4.0–10.5)
nRBC: 0 % (ref 0.0–0.2)

## 2020-10-11 LAB — HEMOGLOBIN A1C
Hgb A1c MFr Bld: 5.9 % — ABNORMAL HIGH (ref 4.8–5.6)
Mean Plasma Glucose: 123 mg/dL

## 2020-10-11 LAB — PHOSPHORUS: Phosphorus: 3.4 mg/dL (ref 2.5–4.6)

## 2020-10-11 MED ORDER — TRACE MINERALS CU-MN-SE-ZN 300-55-60-3000 MCG/ML IV SOLN
INTRAVENOUS | Status: AC
  Filled 2020-10-11: qty 936

## 2020-10-11 MED ORDER — SODIUM CHLORIDE 0.9% FLUSH
5.0000 mL | Freq: Three times a day (TID) | INTRAVENOUS | Status: DC
  Administered 2020-10-11 – 2020-10-24 (×28): 5 mL

## 2020-10-11 NOTE — Progress Notes (Signed)
Attempted to call report to [facility] as pt is assigned to transfer to room 214.  Dan RN to call me back for report

## 2020-10-11 NOTE — Progress Notes (Signed)
Abdominal dressing changed including changed packing.  Pt tolerated well.  It was slightly soiled with serosanguenous drainage.

## 2020-10-11 NOTE — Consult Note (Signed)
PHARMACY - TOTAL PARENTERAL NUTRITION CONSULT NOTE   Indication: Prolonged NPO  Patient Measurements: Height: 5\' 6"  (167.6 cm) Weight: (!) 142.9 kg (315 lb 0.6 oz) IBW/kg (Calculated) : 63.8 TPN AdjBW (KG): 80.3 Body mass index is 50.85 kg/m.  Assessment:  Patient is a 39 y/o M with medical history including morbid obesity, hypertension, diverticulitis who is POD # 10 from sigmoid colectomy for colovesical fistula. Post-operative course complicated by anastomotic dehiscence and surgical wound infection. Pharmacy has been consulted to initiate TPN for anticipated prolonged NPO status.   Glucose / Insulin: No history of diabetes. Normoglycemic. BG 95-135. SSI/24 hrs: 0 Electrolytes: WNL Renal: Scr < 1 (stable and c/w baseline) LFTs / TGs: LFTs within normal limits. TG 156  Prealbumin / albumin: Albumin 2.6 on 10/15 Intake / Output; MIVF: 2500 mL UOP documented 10/14.  GI Imaging: 10/12 CT Abdomen/Pelvis: 1. Postoperative findings of sigmoid colon resection and reanastomosis. There is a frank defect at the right aspect of the anastomosis with air and fluid tracking superiorly into the central small bowel mesentery. Although very elongated and irregular in shape, the largest, most central component of air and fluid in the central small bowel mesentery measures at least 13.3 x 4.8 cm. 2. The small bowel is diffusely fluid-filled without a distinctly identified transition point and there is gas present throughout the colon to the rectum. There are thickened loops of small bowel adjacent to air and fluid within the mesentery, likely reactive to adjacent infection and overall findings most consistent with ileus. 3. Trace free fluid in the low abdomen and pelvis in addition to minimal pneumoperitoneum. There is subcutaneous emphysema about the anterior left hemiabdomen and a small volume of emphysema about the lower esophagus. Findings are generally in keeping with recent laparoscopy.  Surgeries  / Procedures:  10/6 Robotic assisted sigmoid colectomy 10/12 I&D of post-operative wound infection  Central access: 10/14  TPN start date: 10/14   Nutritional Goals (per RD recommendation on 10/13): kCal: 2900-3200, Protein: 130-145g, Fluid: 2 - 2.3L Goal TPN rate is 90 mL/hr (provides 140 g of protein and 3000 kcals per day)  Current Nutrition:  NPO  Plan:  -continue TPN to goal rate of 90 mL/hr  --Electrolytes in TPN: 62mEq/L of Na, 21mEq/L of K, 43mEq/L of Ca, 1mEq/L of Mg, and 40mmol/L of Phos. Cl:Ac ratio 1:1 --Add standard MVI and trace elements to TPN, thiamine 100 mg x 3 days (thru 10/16) --Continue Sensitive q8h SSI and adjust as needed  --Reduce MIVF to 10 mL/hr at 1800 --Monitor TPN labs on Mon/Thurs, will monitor BMP, Mg, Phos x 3 days given risk for re-feeding  Addalie Calles A 10/11/2020,10:02 AM

## 2020-10-11 NOTE — Progress Notes (Signed)
10/11/2020  Subjective: Patient is 10 Days Post-Op s/p robotic assisted sigmoidectomy.  No acute events.  Yesterday, the patient had a percutaneous drain placed by IR to help with the intra-abdominal air/fluid collection from his anastomotic leak.  Patient feels well this morning.  WBC mildly elevated to 13.3.  Drain output not recorded and has 1 BM recorded.  Remains NPO, taking some ice chips, with TPN for nutrition.  Of note, he is now completed 10 days of contact/airborne precautions for COVID-19.  Vital signs: Temp:  [97.5 F (36.4 C)-100.3 F (37.9 C)] 98.5 F (36.9 C) (10/16 1147) Pulse Rate:  [86-107] 86 (10/16 1147) Resp:  [16-25] 18 (10/16 1147) BP: (138-165)/(81-108) 138/81 (10/16 1147) SpO2:  [97 %-100 %] 99 % (10/16 1147) Weight:  [142.9 kg] 142.9 kg (10/16 0500)   Intake/Output: 10/15 0701 - 10/16 0700 In: 480 [P.O.:480] Out: 3550 [Urine:3550] Last BM Date: 10/10/20  Physical Exam: Constitutional: No acute distress Abdomen:  Soft, obese, non-distended, appropriately tender to palpation.  One of the port incisions has gauze packing which had some purulent drainage, but no erythema or induration.  This was changed at bedside.  The other incisions are clean, dry, intact.  Drain has purulent fluid in bag as well.  Labs:  Recent Labs    10/10/20 0513 10/11/20 0449  WBC 11.9* 13.3*  HGB 11.2* 11.1*  HCT 34.3* 35.2*  PLT 429* 520*   Recent Labs    10/10/20 0513 10/11/20 0450  NA 132* 137  K 3.3* 3.8  CL 95* 100  CO2 28 27  GLUCOSE 126* 121*  BUN 6 7  CREATININE 0.65 0.68  CALCIUM 8.2* 8.2*   No results for input(s): LABPROT, INR in the last 72 hours.  Imaging: CT IMAGE GUIDED DRAINAGE BY PERCUTANEOUS CATHETER  Result Date: 10/10/2020 INDICATION: 39 year old with with recent robotic assisted sigmoid colectomy for complicated diverticulitis and a colovesical fistula. Recent positive test for COVID-19. Postoperative imaging has demonstrated a large amount  of free air and gas at the surgical anastomosis. Imaging findings are suggestive for an anastomosis leak. Request for percutaneous drainage of the air-fluid collection in the left lower quadrant of the abdomen. EXAM: CT GUIDED DRAINAGE OF LEFT LOWER ABDOMINAL ABSCESS MEDICATIONS: Moderate sedation ANESTHESIA/SEDATION: 2.0 mg IV Versed 100 mcg IV Fentanyl Moderate Sedation Time:  26 minutes The patient was continuously monitored during the procedure by the interventional radiology nurse under my direct supervision. COMPLICATIONS: None immediate. TECHNIQUE: Informed written consent was obtained from the patient after a thorough discussion of the procedural risks, benefits and alternatives. All questions were addressed. Maximal Sterile Barrier Technique was utilized including caps, mask, sterile gowns, sterile gloves, sterile drape, hand hygiene and skin antiseptic. A timeout was performed prior to the initiation of the procedure. PROCEDURE: Patient was placed supine on the CT scanner. Images through the lower abdomen and pelvis were obtained. Large air-fluid collection in the left lower quadrant abdomen was identified and targeted. The left lower abdomen was prepped with chlorhexidine and sterile field was created. Skin and soft tissues were anesthetized using 1% lidocaine. A small incision was made. Using CT guidance, an 18 gauge trocar needle was directed into the large gas-filled collection. Small amount of cloudy yellow fluid was aspirated. Superstiff Amplatz wire was advanced into the gas-filled collection. Tract was dilated to accommodate a 12 [place] multipurpose drain. Approximately 5 mL of cloudy fluid was removed along with air. Catheter was attached to a gravity bag and sutured in place. FINDINGS: Complex gas-filled  collections in the lower abdomen and pelvis. The large collection in the left lower quadrant was targeted. 12 French drain was placed within the collection and the drain extends towards the  midline. Gas and a small amount of cloudy yellow fluid was removed. Again noted is gas and fluid near the anastomosis in the pelvis. Findings are compatible with an anastomotic leak. IMPRESSION: Successful placement of a CT-guided drain in the left lower quadrant abdominal abscess. Small amount of fluid was obtained. Fluid was sent for culture. Electronically Signed   By: Richarda Overlie M.D.   On: 10/10/2020 16:23    Assessment/Plan: This is a 39 y.o. male s/p robotic assisted sigmoidectomy, with complication of anastomotic leak.  --Patient remains stable and non-toxic.  No urgent surgical intervention is needed.  --Continue NPO, TPN, IV antibiotic for conservative management of the anastomotic leak. --Have D/C'd his COVID precautions.  Will try to transfer to 2c today, pending bed availability. --OOB, ambulate.   Howie Ill, MD [facility]

## 2020-10-11 NOTE — Progress Notes (Signed)
10/11/20  Discussed patient's COVID contact/airborne precautions with Mont Dutton with Infection Prevention yesterday.  The patient had a positive test on 10/01/20 and started on isolation precautions that day.  He has been on precautions for 10 days and is asymptomatic from COVID standpoint.  Will d/c contact/airborne precautions today.  Henrene Dodge, MD

## 2020-10-11 NOTE — Progress Notes (Signed)
Report given to [facility] RN on 2C, pt to transfer at this time to 214, TPN and ordered zosyn are running

## 2020-10-12 LAB — BASIC METABOLIC PANEL
Anion gap: 10 (ref 5–15)
BUN: 9 mg/dL (ref 6–20)
CO2: 29 mmol/L (ref 22–32)
Calcium: 8.5 mg/dL — ABNORMAL LOW (ref 8.9–10.3)
Chloride: 106 mmol/L (ref 98–111)
Creatinine, Ser: 0.73 mg/dL (ref 0.61–1.24)
GFR, Estimated: >60 mL/min (ref >60)
Glucose, Bld: 135 mg/dL — ABNORMAL HIGH (ref 70–99)
Potassium: 4.1 mmol/L (ref 3.5–5.1)
Sodium: 145 mmol/L (ref 135–145)

## 2020-10-12 LAB — GLUCOSE, CAPILLARY
Glucose-Capillary: 107 mg/dL — ABNORMAL HIGH (ref 70–99)
Glucose-Capillary: 112 mg/dL — ABNORMAL HIGH (ref 70–99)
Glucose-Capillary: 117 mg/dL — ABNORMAL HIGH (ref 70–99)
Glucose-Capillary: 138 mg/dL — ABNORMAL HIGH (ref 70–99)
Glucose-Capillary: 140 mg/dL — ABNORMAL HIGH (ref 70–99)
Glucose-Capillary: 93 mg/dL (ref 70–99)

## 2020-10-12 LAB — PHOSPHORUS: Phosphorus: 3.9 mg/dL (ref 2.5–4.6)

## 2020-10-12 LAB — MAGNESIUM: Magnesium: 2.3 mg/dL (ref 1.7–2.4)

## 2020-10-12 MED ORDER — INSULIN ASPART 100 UNIT/ML ~~LOC~~ SOLN: 0.0000 [IU] | Freq: Four times a day (QID) | SUBCUTANEOUS | Status: DC

## 2020-10-12 MED ORDER — INSULIN ASPART 100 UNIT/ML ~~LOC~~ SOLN
0.0000 [IU] | Freq: Four times a day (QID) | SUBCUTANEOUS | Status: DC
  Filled 2020-10-12: qty 1

## 2020-10-12 MED ORDER — TRACE MINERALS CU-MN-SE-ZN 300-55-60-3000 MCG/ML IV SOLN
INTRAVENOUS | Status: AC
  Filled 2020-10-12: qty 936

## 2020-10-12 MED ORDER — FUROSEMIDE 10 MG/ML IJ SOLN
20.0000 mg | Freq: Once | INTRAMUSCULAR | Status: AC
  Administered 2020-10-12: 20 mg via INTRAVENOUS
  Filled 2020-10-12: qty 4

## 2020-10-12 NOTE — Consult Note (Signed)
PHARMACY - TOTAL PARENTERAL NUTRITION CONSULT NOTE   Indication: Prolonged NPO  Patient Measurements: Height: 5\' 6"  (167.6 cm) Weight: (!) 142.9 kg (315 lb 0.6 oz) IBW/kg (Calculated) : 63.8 TPN AdjBW (KG): 80.3 Body mass index is 50.85 kg/m.  Assessment:  Patient is a 39 y/o M with medical history including morbid obesity, hypertension, diverticulitis who is POD # 10 from sigmoid colectomy for colovesical fistula. Post-operative course complicated by anastomotic dehiscence and surgical wound infection. Pharmacy has been consulted to initiate TPN for anticipated prolonged NPO status.   Glucose / Insulin: No history of diabetes. Normoglycemic. BG 107-138. SSI/24 hrs: 0    (10/17-looks like pt should have received some SSI but was not given overnight?) Electrolytes: WNL Renal: Scr < 1 (stable and c/w baseline) LFTs / TGs: LFTs within normal limits. TG 156  Prealbumin / albumin: Albumin 2.6 on 10/15 Intake / Output; MIVF:  GI Imaging: 10/12 CT Abdomen/Pelvis: 1. Postoperative findings of sigmoid colon resection and reanastomosis. There is a frank defect at the right aspect of the anastomosis with air and fluid tracking superiorly into the central small bowel mesentery. Although very elongated and irregular in shape, the largest, most central component of air and fluid in the central small bowel mesentery measures at least 13.3 x 4.8 cm. 2. The small bowel is diffusely fluid-filled without a distinctly identified transition point and there is gas present throughout the colon to the rectum. There are thickened loops of small bowel adjacent to air and fluid within the mesentery, likely reactive to adjacent infection and overall findings most consistent with ileus. 3. Trace free fluid in the low abdomen and pelvis in addition to minimal pneumoperitoneum. There is subcutaneous emphysema about the anterior left hemiabdomen and a small volume of emphysema about the lower esophagus. Findings are  generally in keeping with recent laparoscopy.  Surgeries / Procedures:  10/6 Robotic assisted sigmoid colectomy 10/12 I&D of post-operative wound infection  Central access: 10/14  TPN start date: 10/14   Nutritional Goals (per RD recommendation on 10/13): kCal: 2900-3200, Protein: 130-145g, Fluid: 2 - 2.3L Goal TPN rate is 90 mL/hr (provides 140 g of protein and 3000 kcals per day)  Current Nutrition:  NPO  Plan:  -Continue TPN to goal rate of 90 mL/hr  Clinisol-amino acid 15% (65g/L) Dextrose 21% Lipids 20% (42 g/L) --Electrolytes in TPN: 45mEq/L of Na, 53mEq/L of K, 24mEq/L of Ca, 5mEq/L of Mg, and 35mmol/L of Phos. Cl:Ac ratio 1:1 --Add standard MVI and trace elements to TPN, thiamine 100 mg x 3 days (thru 10/16-now complete) --Continue Sensitive q8h SSI and adjust as needed  --Reduce MIVF to 10 mL/hr at 1800 --Monitor TPN labs on Mon/Thurs, will monitor BMP, Mg, Phos x 3 days given risk for re-feeding  Zykerria Tanton A 10/12/2020,10:01 AM

## 2020-10-12 NOTE — Progress Notes (Signed)
10/12/2020  Subjective: Patient is 11 Days Post-Op status post robotic assisted sigmoidectomy.  No acute events overnight.  Patient reports still having some abdominal discomfort but there is no worsening pain at this point.  He remains n.p.o. with TPN for nutrition.  Patient had a bowel movement this morning.  Vital signs: Temp:  [97.5 F (36.4 C)-98.9 F (37.2 C)] 98.6 F (37 C) (10/17 1236) Pulse Rate:  [86-97] 97 (10/17 1236) Resp:  [16-20] 16 (10/17 1236) BP: (126-159)/(82-99) 126/88 (10/17 1236) SpO2:  [96 %-100 %] 99 % (10/17 1236)   Intake/Output: 10/16 0701 - 10/17 0700 In: 2914.6 [I.V.:2630.6; IV Piggyback:284] Out: 1900 [Urine:1900] Last BM Date: 10/12/20  Physical Exam: Constitutional: No acute distress Abdomen: Soft, obese, nondistended, with appropriate tenderness to palpation over the incisions and the left lower quadrant drain.  Drain has seropurulent fluid today.  Labs:  Recent Labs    10/10/20 0513 10/11/20 0449  WBC 11.9* 13.3*  HGB 11.2* 11.1*  HCT 34.3* 35.2*  PLT 429* 520*   Recent Labs    10/11/20 0450 10/12/20 0507  NA 137 145  K 3.8 4.1  CL 100 106  CO2 27 29  GLUCOSE 121* 135*  BUN 7 9  CREATININE 0.68 0.73  CALCIUM 8.2* 8.5*   No results for input(s): LABPROT, INR in the last 72 hours.  Imaging: No results found.  Assessment/Plan: This is a 39 y.o. male s/p robotic assisted sigmoidectomy complicated with anastomotic leak status post percutaneous drainage.  -Continue n.p.o. diet, with TPN for IV nutrition.  Discussed with the patient that we would be on this plan for the foreseeable future until we know better that this anastomotic leak has improved. -Continue IV antibiotics for his leak. -Continue percutaneous drainage which has improved in character today compared to yesterday.   Howie Ill, MD [facility]

## 2020-10-13 LAB — CBC
HCT: 35.2 % — ABNORMAL LOW (ref 39.0–52.0)
Hemoglobin: 11.1 g/dL — ABNORMAL LOW (ref 13.0–17.0)
MCH: 26.2 pg (ref 26.0–34.0)
MCHC: 31.5 g/dL (ref 30.0–36.0)
MCV: 83 fL (ref 80.0–100.0)
Platelets: 535 10*3/uL — ABNORMAL HIGH (ref 150–400)
RBC: 4.24 MIL/uL (ref 4.22–5.81)
RDW: 15.3 % (ref 11.5–15.5)
WBC: 12.7 10*3/uL — ABNORMAL HIGH (ref 4.0–10.5)
nRBC: 0 % (ref 0.0–0.2)

## 2020-10-13 LAB — COMPREHENSIVE METABOLIC PANEL
ALT: 19 U/L (ref 0–44)
AST: 22 U/L (ref 15–41)
Albumin: 2.6 g/dL — ABNORMAL LOW (ref 3.5–5.0)
Alkaline Phosphatase: 91 U/L (ref 38–126)
Anion gap: 8 (ref 5–15)
BUN: 11 mg/dL (ref 6–20)
CO2: 29 mmol/L (ref 22–32)
Calcium: 8.3 mg/dL — ABNORMAL LOW (ref 8.9–10.3)
Chloride: 102 mmol/L (ref 98–111)
Creatinine, Ser: 0.77 mg/dL (ref 0.61–1.24)
GFR, Estimated: >60 mL/min (ref >60)
Glucose, Bld: 136 mg/dL — ABNORMAL HIGH (ref 70–99)
Potassium: 4.2 mmol/L (ref 3.5–5.1)
Sodium: 139 mmol/L (ref 135–145)
Total Bilirubin: 0.6 mg/dL (ref 0.3–1.2)
Total Protein: 6.4 g/dL — ABNORMAL LOW (ref 6.5–8.1)

## 2020-10-13 LAB — DIFFERENTIAL
Abs Immature Granulocytes: 0.64 10*3/uL — ABNORMAL HIGH (ref 0.00–0.07)
Basophils Absolute: 0.1 10*3/uL (ref 0.0–0.1)
Basophils Relative: 1 %
Eosinophils Absolute: 0.5 10*3/uL (ref 0.0–0.5)
Eosinophils Relative: 4 %
Immature Granulocytes: 5 %
Lymphocytes Relative: 17 %
Lymphs Abs: 2.1 10*3/uL (ref 0.7–4.0)
Monocytes Absolute: 0.9 10*3/uL (ref 0.1–1.0)
Monocytes Relative: 7 %
Neutro Abs: 8.5 10*3/uL — ABNORMAL HIGH (ref 1.7–7.7)
Neutrophils Relative %: 66 %

## 2020-10-13 LAB — PHOSPHORUS: Phosphorus: 4.4 mg/dL (ref 2.5–4.6)

## 2020-10-13 LAB — TRIGLYCERIDES: Triglycerides: 137 mg/dL (ref <150)

## 2020-10-13 LAB — GLUCOSE, CAPILLARY
Glucose-Capillary: 99 mg/dL (ref 70–99)
Glucose-Capillary: 126 mg/dL — ABNORMAL HIGH (ref 70–99)
Glucose-Capillary: 116 mg/dL — ABNORMAL HIGH (ref 70–99)

## 2020-10-13 LAB — MAGNESIUM: Magnesium: 2.1 mg/dL (ref 1.7–2.4)

## 2020-10-13 LAB — PREALBUMIN: Prealbumin: 8.5 mg/dL — ABNORMAL LOW (ref 18–38)

## 2020-10-13 MED ORDER — TRACE MINERALS CU-MN-SE-ZN 300-55-60-3000 MCG/ML IV SOLN
INTRAVENOUS | Status: AC
  Filled 2020-10-13: qty 936

## 2020-10-13 MED ORDER — BOOST / RESOURCE BREEZE PO LIQD CUSTOM
1.0000 | Freq: Three times a day (TID) | ORAL | Status: DC
  Administered 2020-10-13 – 2020-10-16 (×5): 1 via ORAL

## 2020-10-13 NOTE — Care Management Important Message (Signed)
Important Message  Patient Details  Name: Jordan [name] MRN: [id number] Date of Birth: November 20, 1981   Medicare Important Message Given:  Yes     [name] 10/13/2020, 1:02 PM

## 2020-10-13 NOTE — Progress Notes (Addendum)
[facility] SURGICAL PROGRESS NOTE  Hospital Day(s): 12.   Post op day(s): 12 Days Post-Op.   Interval History:  Patient seen and examined no acute events or new complaints overnight.  Patient reports he feels that he continues to slowly improve His suprapubic abdominal pain is improving; intermittent at most, controlled with pain regimen No nausea or emesis Leukocytosis is stable at 12.7K; no fevers Renal function normal, sCr - 0.77, UO - 1.6L No significant electrolyte derangements Wound Cx obtained during I&D --> growing E coli, sensitive to Zosyn  Cx from drain placement on 10/15 pending Drain output, 20 ccs, this appears more serous this morning He continues to have BM recorded and pass flatus    Vital signs in last 24 hours: [min-max] current  Temp:  [97.5 F (36.4 C)-98.6 F (37 C)] 98 F (36.7 C) (10/18 0411) Pulse Rate:  [87-106] 91 (10/18 0411) Resp:  [16-20] 20 (10/18 0411) BP: (126-153)/(78-99) 153/78 (10/18 0411) SpO2:  [99 %-100 %] 100 % (10/18 0411) Weight:  [142.8 kg] 142.8 kg (10/18 0500)     Height: 5\' 6"  (167.6 cm) Weight: (!) 142.8 kg BMI (Calculated): 50.84   Intake/Output last 2 shifts:  10/17 0701 - 10/18 0700 In: 1320.1 [I.V.:1228.9; IV Piggyback:91.2] Out: 1320 [Urine:1300; Drains:20]   Physical Exam:  Constitutional: alert, cooperative and no distress  Respiratory: breathing non-labored at rest  Cardiovascular: regular rate and sinus rhythm  Gastrointestinal:Soft,no appreciable tenderness this morning, non-distended, no rebound/guarding.Certainly without overt peritonitis, Drain in LLQ with more serous appearing output Integumentary:The middle right laparoscopic incision iss/o I&D, packing in place, surrounding erythema and induration improved, no expressible drainage. Probed with q-tip without any further undrained pockets of purulence.The remaining 3 incisions appear CDI with dermabond no erythema or drainage.   Labs:   CBC Latest Ref Rng & Units 10/13/2020 10/11/2020 10/10/2020  WBC 4.0 - 10.5 K/uL 12.7(H) 13.3(H) 11.9(H)  Hemoglobin 13.0 - 17.0 g/dL 11.1(L) 11.1(L) 11.2(L)  Hematocrit 39 - 52 % 35.2(L) 35.2(L) 34.3(L)  Platelets 150 - 400 K/uL 535(H) 520(H) 429(H)   CMP Latest Ref Rng & Units 10/13/2020 10/12/2020 10/11/2020  Glucose 70 - 99 mg/dL 10/13/2020) 941(D) 408(X)  BUN 6 - 20 mg/dL 11 9 7   Creatinine 0.61 - 1.24 mg/dL 448(J 8.56  Sodium 135 - 145 mmol/L 139 145 137  Potassium 3.5 - 5.1 mmol/L 4.2 4.1 3.8  Chloride 98 - 111 mmol/L 102 106 100  CO2 22 - 32 mmol/L 29 29 27   Calcium 8.9 - 10.3 mg/dL 8.3(L) 8.5(L) 8.2(L)  Total Protein 6.5 - 8.1 g/dL 6.4(L) - -  Total Bilirubin 0.3 - 1.2 mg/dL 0.6 - -  Alkaline Phos 38 - 126 U/L 91 - -  AST 15 - 41 U/L 22 - -  ALT 0 - 44 U/L 19 - -     Imaging studies: No new pertinent imaging studies   Assessment/Plan:  39 y.o. male 17 Days Post-Op s/p robotic assisted sigmoid colectomyfor complicated diverticulitis with colovesical fistula, complicated by anastomotic leak s/p percutaneous drainage on 10/15   - So far his clinical progression is encouraging. His leukocytosis has remained stable/improved and abdominal discomforts have improved over the course of the last 24 hours and he continues to have bowel function. Although not usual for this degree of anastomotic leak, we will continue these conservative measures as he has shown no clinical evidence of deterioration and has actually made improvements. Patient will need repeat imaging this week; mostly on Wednesday 10/20; to  ensure continue improvement   - Will trial sips of CLD this morning  - ContinueTPN, IVF at goal rate today - ContinueIV ABx (Zosyn); follow up Cx obtained during I&D --> growing E coli, sensitive to Zosyn   - Continue wound care: remove packing daily, replace, secure with gauze. This can be done PRN as well - Monitor abdominal examination  closely - Monitor leukocytosis; fever curve - No emergent surgical re-intervention; potentially may needanastomotic takedown/colostomy -vs-diverting loop ileostomy should clinical condition worsen, additional options include drain placement with IR if feasible - Pain control prn; antiemetics prn - Mobilization encouraged - Home medications   All of the above findings and recommendations were discussed with the patient, and the medical team, and all of patient's questions were answered to his expressed satisfaction.  -- Lynden Oxford, PA-C [facility] 10/13/2020, 7:39 AM [phone] M-F: 7am - 4pm

## 2020-10-13 NOTE — Consult Note (Signed)
PHARMACY - TOTAL PARENTERAL NUTRITION CONSULT NOTE   Indication: Prolonged NPO  Patient Measurements: Height: 5\' 6"  (167.6 cm) Weight: (!) 142.8 kg (314 lb 13.1 oz) IBW/kg (Calculated) : 63.8 TPN AdjBW (KG): 80.3 Body mass index is 50.81 kg/m.  Assessment:  Patient is a 39 y/o M with medical history including morbid obesity, hypertension, diverticulitis who is POD # 12 from sigmoid colectomy for colovesical fistula. Post-operative course complicated by anastomotic dehiscence and surgical wound infection. Pharmacy has been consulted to initiate TPN for anticipated prolonged NPO status.   Glucose / Insulin: No history of diabetes. Normoglycemic. BG 93 - 140. SSI/24 hrs: 0   Electrolytes: WNL Renal: Scr < 1 (stable and c/w baseline) LFTs / TGs: LFTs within normal limits. TG 156  Prealbumin / albumin: Albumin 2.6 on 10/15 Intake / Output; MIVF:  GI Imaging: 10/12 CT Abdomen/Pelvis: 1. Postoperative findings of sigmoid colon resection and reanastomosis. There is a frank defect at the right aspect of the anastomosis with air and fluid tracking superiorly into the central small bowel mesentery. Although very elongated and irregular in shape, the largest, most central component of air and fluid in the central small bowel mesentery measures at least 13.3 x 4.8 cm. 2. The small bowel is diffusely fluid-filled without a distinctly identified transition point and there is gas present throughout the colon to the rectum. There are thickened loops of small bowel adjacent to air and fluid within the mesentery, likely reactive to adjacent infection and overall findings most consistent with ileus. 3. Trace free fluid in the low abdomen and pelvis in addition to minimal pneumoperitoneum. There is subcutaneous emphysema about the anterior left hemiabdomen and a small volume of emphysema about the lower esophagus. Findings are generally in keeping with recent laparoscopy.  Surgeries / Procedures:  10/6  Robotic assisted sigmoid colectomy 10/12 I&D of post-operative wound infection  Central access: 10/14  TPN start date: 10/14   Nutritional Goals (per RD recommendation on 10/13): kCal: 2900-3200, Protein: 130-145g, Fluid: 2 - 2.3L Goal TPN rate is 90 mL/hr (provides 140 g of protein and 3000 kcals per day)  Current Nutrition:  Attempting CLD  Plan:   Continue TPN at 90 mL/hr   Nutritional Components:  Clinisol-amino acid 15%: 65g/L, 140.4g  Dextrose 21%, 453.6 g  Lipids 20%: 42 g/L, 90.8g  Calories: 3011.8 kCal  Electrolytes in TPN: standard, Cl:Ac ratio 1:1  Add standard MVI and trace elements to TPN  No SSI required last 4 days: stop SSI   Continue MIVF at 10 mL/hr   Monitor TPN labs on Mon/Thurs since electrolytes have been stable  11/13 10/13/2020,7:17 AM

## 2020-10-14 LAB — AEROBIC/ANAEROBIC CULTURE W GRAM STAIN (SURGICAL/DEEP WOUND)

## 2020-10-14 LAB — CBC
HCT: 35.1 % — ABNORMAL LOW (ref 39.0–52.0)
Hemoglobin: 11.2 g/dL — ABNORMAL LOW (ref 13.0–17.0)
MCH: 26.2 pg (ref 26.0–34.0)
MCHC: 31.9 g/dL (ref 30.0–36.0)
MCV: 82 fL (ref 80.0–100.0)
Platelets: 523 10*3/uL — ABNORMAL HIGH (ref 150–400)
RBC: 4.28 MIL/uL (ref 4.22–5.81)
RDW: 15 % (ref 11.5–15.5)
WBC: 9.8 10*3/uL (ref 4.0–10.5)
nRBC: 0 % (ref 0.0–0.2)

## 2020-10-14 MED ORDER — ASCORBIC ACID 500 MG PO TABS
250.0000 mg | ORAL_TABLET | Freq: Two times a day (BID) | ORAL | Status: DC
  Administered 2020-10-14 – 2020-10-24 (×18): 250 mg via ORAL
  Filled 2020-10-14 (×18): qty 1

## 2020-10-14 MED ORDER — SODIUM CHLORIDE 0.9 % IV BOLUS
1000.0000 mL | Freq: Once | INTRAVENOUS | Status: AC
  Administered 2020-10-14: 1000 mL via INTRAVENOUS

## 2020-10-14 MED ORDER — VANCOMYCIN HCL 2000 MG/400ML IV SOLN
2000.0000 mg | Freq: Once | INTRAVENOUS | Status: AC
  Administered 2020-10-14: 2000 mg via INTRAVENOUS
  Filled 2020-10-14: qty 400

## 2020-10-14 MED ORDER — TRACE MINERALS CU-MN-SE-ZN 300-55-60-3000 MCG/ML IV SOLN
INTRAVENOUS | Status: AC
  Filled 2020-10-14: qty 936

## 2020-10-14 MED ORDER — VANCOMYCIN HCL IN DEXTROSE 1-5 GM/200ML-% IV SOLN
1000.0000 mg | Freq: Three times a day (TID) | INTRAVENOUS | Status: DC
  Administered 2020-10-14 – 2020-10-17 (×8): 1000 mg via INTRAVENOUS
  Filled 2020-10-14 (×13): qty 200

## 2020-10-14 MED ORDER — ACETAMINOPHEN 500 MG PO TABS
1000.0000 mg | ORAL_TABLET | Freq: Four times a day (QID) | ORAL | Status: DC | PRN
  Administered 2020-10-14: 1000 mg via ORAL

## 2020-10-14 NOTE — Consult Note (Signed)
Pharmacy Antibiotic Note  Jordan [name] is a 39 y.o. male admitted on 10/01/2020 with diverticulitis now 13 Days Post-Op s/p robotic assisted sigmoid colectomy.  Pharmacy has been consulted for vancomycin and Zosyn dosing. His wound culture has grown out ampicillin-resistant Enterococcus.  Plan:  1) start vancomycin 2000 mg IV x 1 then 1000 mg IV Q 8 hrs  Ke: 0.096, T1/2: 7.2h  Daily SCr while on daily vancomycin  Vancomycin levels as clinically indicated  2) continue Zosyn 3.375g IV q8h (4 hour infusion).  Height: 5\' 6"  (167.6 cm) Weight: (!) 142.8 kg (314 lb 13.1 oz) IBW/kg (Calculated) : 63.8  Temp (24hrs), Avg:98.8 F (37.1 C), Min:98.5 F (36.9 C), Max:98.9 F (37.2 C)  Recent Labs  Lab 10/09/20 0536 10/10/20 0513 10/11/20 0449 10/11/20 0450 10/12/20 0507 10/13/20 0539 10/14/20 0451  WBC 15.9* 11.9* 13.3*  --   --  12.7* 9.8  CREATININE 0.87 0.65  --  0.68 0.73 0.77  --     Estimated Creatinine Clearance: 165.6 mL/min (by C-G formula based on SCr of 0.77 mg/dL).    Allergies  Allergen Reactions  . Butrans [Buprenorphine Hcl]     Difficulty breathing, n/v  . Nucynta [Tapentadol Hydrochloride] Anaphylaxis  . Tapentadol Hcl Anaphylaxis  . Glucose Polymer Other (See Comments)    poison  . Sumac     poison  . Bactrim [Sulfamethoxazole-Trimethoprim] Itching and Rash    Antimicrobials this admission: 10/12 Zosyn >>  10/19 vancomycin >>   Microbiology results: 10/09 SARS CoV-2: negative  10/09 influenza A/B: negative 10/15 WCx: E coli, E raffinosus, beta lactamase (+) 10/12 WCx: E coli  Thank you for allowing pharmacy to be a part of this patient's care.  12/12 10/14/2020 10:30 AM

## 2020-10-14 NOTE — Consult Note (Signed)
PHARMACY - TOTAL PARENTERAL NUTRITION CONSULT NOTE   Indication: Prolonged NPO  Patient Measurements: Height: 5\' 6"  (167.6 cm) Weight: (!) 142.8 kg (314 lb 13.1 oz) IBW/kg (Calculated) : 63.8 TPN AdjBW (KG): 80.3 Body mass index is 50.81 kg/m.  Assessment:  Patient is a 39 y/o M with medical history including morbid obesity, hypertension, diverticulitis who is POD # 13 from sigmoid colectomy for colovesical fistula. Post-operative course complicated by anastomotic dehiscence and surgical wound infection. Pharmacy has been consulted to initiate TPN for anticipated prolonged NPO status.   Glucose / Insulin: No history of diabetes. Normoglycemic Electrolytes: WNL Renal: Scr < 1 (stable and c/w baseline) LFTs / TGs: LFTs within normal limits. TG 156  Prealbumin / albumin: Albumin 2.6 on 10/15 Intake / Output; MIVF:  GI Imaging: 10/12 CT Abdomen/Pelvis: 1. Postoperative findings of sigmoid colon resection and reanastomosis. There is a frank defect at the right aspect of the anastomosis with air and fluid tracking superiorly into the central small bowel mesentery. Although very elongated and irregular in shape, the largest, most central component of air and fluid in the central small bowel mesentery measures at least 13.3 x 4.8 cm. 2. The small bowel is diffusely fluid-filled without a distinctly identified transition point and there is gas present throughout the colon to the rectum. There are thickened loops of small bowel adjacent to air and fluid within the mesentery, likely reactive to adjacent infection and overall findings most consistent with ileus. 3. Trace free fluid in the low abdomen and pelvis in addition to minimal pneumoperitoneum. There is subcutaneous emphysema about the anterior left hemiabdomen and a small volume of emphysema about the lower esophagus. Findings are generally in keeping with recent laparoscopy.  Surgeries / Procedures:  10/6 Robotic assisted sigmoid  colectomy 10/12 I&D of post-operative wound infection  Central access: 10/14  TPN start date: 10/14   Nutritional Goals (per RD recommendation on 10/13): kCal: 2900-3200, Protein: 130-145g, Fluid: 2 - 2.3L Goal TPN rate is 90 mL/hr (provides 140 g of protein and 3000 kcals per day)  Current Nutrition:  Attempting CLD  Plan:   Continue TPN at 90 mL/hr   Nutritional Components:  Clinisol-amino acid 15%: 65g/L, 140.4g  Dextrose 21%, 453.6 g  Lipids 20%: 42 g/L, 90.8g  Calories: 3011.8 kCal  Electrolytes in TPN: standard, Cl:Ac ratio 1:1  Add standard MVI and trace elements to TPN  Continue MIVF at 10 mL/hr   Monitor TPN labs on Mon/Thurs since electrolytes have been stable  11/13 10/14/2020,7:06 AM

## 2020-10-14 NOTE — Progress Notes (Addendum)
[facility] SURGICAL PROGRESS NOTE  Hospital Day(s): 13.   Post op day(s): 13 Days Post-Op.   Interval History:  Patient seen and examined no acute events or new complaints overnight.  Patient reports he continues to make improvements. Still with intermittent abdominal discomfort, relieved with passing flatus His leukocytosis has resolved this morning, WBC down to 9.8K No other labs this morning No fever, chills, nausea, emesis He is making good urine, UO - 3.0L + unmeasured in last 24 hours Percutaneous drain with 30 ccs out, more serous Culture from drain placement and Wound Cx obtained during I&D--> both growing E coli (sensitive to Zosyn); and and enterococcus (resistant to ampicillin)  He was trialed on CLD; toelrating well He continues to have bowel function    Vital signs in last 24 hours: [min-max] current  Temp:  [98.5 F (36.9 C)-98.9 F (37.2 C)] 98.9 F (37.2 C) (10/19 0502) Pulse Rate:  [93-102] 93 (10/19 0502) Resp:  [14-18] 18 (10/19 0502) BP: (139-148)/(89-96) 148/89 (10/19 0502) SpO2:  [99 %-100 %] 99 % (10/19 0502)     Height: 5\' 6"  (167.6 cm) Weight: (!) 142.8 kg BMI (Calculated): 50.84   Intake/Output last 2 shifts:  10/18 0701 - 10/19 0700 In: 4264.4 [P.O.:1196; I.V.:2847.6; IV Piggyback:210.8] Out: 3005 [Urine:2975; Drains:30]   Physical Exam:  Constitutional: alert, cooperative and no distress  Respiratory: breathing non-labored at rest  Cardiovascular: regular rate and sinus rhythm  Gastrointestinal:Soft,no appreciable tenderness this morning, non-distended, no rebound/guarding.Certainly without overt peritonitis, Drain in LLQ with more serous appearing output Integumentary:The middle right laparoscopic incision iss/o I&D, packing in place, surrounding erythema and induration improved, no expressible drainage.   Labs:  CBC Latest Ref Rng & Units 10/14/2020 10/13/2020 10/11/2020  WBC 4.0 - 10.5 K/uL 9.8 12.7(H) 13.3(H)   Hemoglobin 13.0 - 17.0 g/dL 11.2(L) 11.1(L) 11.1(L)  Hematocrit 39 - 52 % 35.1(L) 35.2(L) 35.2(L)  Platelets 150 - 400 K/uL 523(H) 535(H) 520(H)   CMP Latest Ref Rng & Units 10/13/2020 10/12/2020 10/11/2020  Glucose 70 - 99 mg/dL 10/13/2020) 027(O) 536(U)  BUN 6 - 20 mg/dL 11 9 7   Creatinine 0.61 - 1.24 mg/dL 440(H 4.74  Sodium 135 - 145 mmol/L 139 145 137  Potassium 3.5 - 5.1 mmol/L 4.2 4.1 3.8  Chloride 98 - 111 mmol/L 102 106 100  CO2 22 - 32 mmol/L 29 29 27   Calcium 8.9 - 10.3 mg/dL 8.3(L) 8.5(L) 8.2(L)  Total Protein 6.5 - 8.1 g/dL 6.4(L) - -  Total Bilirubin 0.3 - 1.2 mg/dL 0.6 - -  Alkaline Phos 38 - 126 U/L 91 - -  AST 15 - 41 U/L 22 - -  ALT 0 - 44 U/L 19 - -     Imaging studies: No new pertinent imaging studies   Assessment/Plan:  39 y.o. male overall doing well 13 Days Post-Op s/p robotic assisted sigmoid colectomyfor complicated diverticulitis with colovesical fistula, complicated by anastomotic leak s/p percutaneous drainage on 10/15   - So far his clinical progression is encouraging. His leukocytosis has remained stable/improved and abdominal discomforts have improved over the course of the last 24 hours and he continues to have bowel function. Although not usual for this degree of anastomotic leak, we will continue these conservative measures as he has shown no clinical evidence of deterioration and has actually made improvements. Patient will need repeat imaging this week; will plan for tomorrow 10/20; to ensure continued improvement   - Will trial sips of CLD this morning             -  ContinueTPN, IVF at goal rate today - ContinueIV ABx (Zosyn); follow up Cx obtained during I&D ad drain placement--> growing E coli (sensitive to Zosyn); and and enterococcus (resistant to ampicillin) --> will add vancomycin              - Continue wound care: remove packing daily, replace, secure with gauze. This can be done PRN as well - Monitor abdominal  examination closely - Monitor leukocytosis; fever curve - No emergent surgical re-intervention; potentially may needanastomotic takedown/colostomy -vs-diverting loop ileostomy should clinical condition worsen, additional options include drain placement with IR if feasible  - Continue current percutaneous drain; monitor and record output - Pain control prn; antiemetics prn - Mobilization encouraged - Home medications   All of the above findings and recommendations were discussed with the patient, patient's family (wife at bedside), and the medical team, and all of patient's and family's questions were answered to their expressed satisfaction.  -- Lynden Oxford, PA-C [facility] 10/14/2020, 7:35 AM [phone] M-F: 7am - 4pm

## 2020-10-14 NOTE — Progress Notes (Signed)
   10/14/20 2133  Assess: MEWS Score  Temp 99.6 F (37.6 C)  BP (!) 124/94  Pulse Rate (!) 123  Resp 18  Level of Consciousness Alert  SpO2 99 %  O2 Device Room Air  Assess: MEWS Score  MEWS Temp 0  MEWS Systolic 0  MEWS Pulse 2  MEWS RR 0  MEWS LOC 0  MEWS Score 2  MEWS Score Color Yellow  Assess: if the MEWS score is Yellow or Red  Were vital signs taken at a resting state? Yes  Focused Assessment No change from prior assessment  Early Detection of Sepsis Score *See Row Information* Low  MEWS guidelines implemented *See Row Information* No, vital signs rechecked  Treat  Pain Scale 0-10  Pain Score 7  Pain Type Chronic pain  Pain Intervention(s) Refused  Take Vital Signs  Increase Vital Sign Frequency  Yellow: Q 2hr X 2 then Q 4hr X 2, if remains yellow, continue Q 4hrs  Escalate  MEWS: Escalate Yellow: discuss with charge nurse/RN and consider discussing with provider and RRT  Notify: Charge Nurse/RN  Name of Charge Nurse/RN Notified Crystan, B  Date Charge Nurse/RN Notified 10/14/20  Time Charge Nurse/RN Notified 2226  Notify: Provider  Provider Name/Title Dr. Everlene Farrier   Date Provider Notified 10/14/20  Time Provider Notified 2226  Notification Type Page  Notification Reason Other (Comment) (Yellow MEWS score continues to be the same )  Response See new orders  Date of Provider Response 10/14/20  Time of Provider Response 2227  Notify: Rapid Response  Name of Rapid Response RN Notified  (not notified at this time)  Document  Patient Outcome Not stable and remains on department  Progress note created (see row info) Yes

## 2020-10-14 NOTE — Progress Notes (Signed)
Nutrition Follow Up Note   DOCUMENTATION CODES:   Morbid obesity  INTERVENTION:   TPN per pharmacy   Recommend add vitamin C 500mg daily to TPN  Boost Breeze po TID, each supplement provides 250 kcal and 9 grams of protein  NUTRITION DIAGNOSIS:   Inadequate oral intake related to acute illness as evidenced by NPO status. -pt advanced to clear liquids   GOAL:   Patient will meet greater than or equal to 90% of their needs  -met with TPN   MONITOR:   PO intake, Diet advancement, Labs, Weight trends, Skin, I & O's, Other (Comment) (TPN)  ASSESSMENT:   39 y/o male with h/o anxiety, depression, chronic pain syndrome, HTN, OSA, morbid obesity, kidney stones, asthma and diverticulitis who is s/p robotic assisted sigmoid colectomy for complicated diverticulitis 10/6 with colovesical fistula, complicated by COVID 19 infection and anastomotic leak   Pt s/p incisional I & D 10/12 Pt s/p IR drain placement 10/15  RD working remotely.  Pt tolerating TPN well at goal rate. Refeed labs stable. Pt initiated on a clear liquid diet 10/18; pt documented to be eating 100% of meals. Pt is ordered for Boost Breeze. Plan is to continue TPN at goal rate. Pt is passing flatus and having BMs. UOP 2.9L. Drains with 30ml output. Per chart, pt is up ~28lbs from his UBW; RD will continue to monitor. Pt is noted to have some mild edema.    Medications reviewed and include: heparin, protonix, zosyn  Labs reviewed: K 4.2 wnl, P 4.4 wnl, Mg 2.1 wnl Pre-albumin 8.5(L) Triglycerides 137 cbgs- 107, 140, 117, 93, 116 x 48 hrs  Diet Order:   Diet Order            Diet clear liquid Room service appropriate? Yes; Fluid consistency: Thin  Diet effective now                EDUCATION NEEDS:   No education needs have been identified at this time  Skin:  Skin Assessment: Reviewed RN Assessment (incision abdomen)  Last BM:  10/18  Height:   Ht Readings from Last 1 Encounters:  10/01/20 5' 6"  (1.676 m)    Weight:   Wt Readings from Last 1 Encounters:  10/13/20 (!) 142.8 kg    Ideal Body Weight:  64.5 kg  BMI:  Body mass index is 50.81 kg/m.  Estimated Nutritional Needs:   Kcal:  2900-3200kcal/day  Protein:  130-145g/day  Fluid:  2-2.3L/day  Casey Campbell MS, RD, LDN Please refer to [facility]ION for RD and/or RD on-call/weekend/after hours pager 

## 2020-10-14 NOTE — Progress Notes (Signed)
   10/14/20 2133  Assess: MEWS Score  Temp 99.6 F (37.6 C)  BP (!) 124/94  Pulse Rate (!) 123  Resp 18  Level of Consciousness Alert  SpO2 99 %  O2 Device Room Air  Assess: MEWS Score  MEWS Temp 0  MEWS Systolic 0  MEWS Pulse 2  MEWS RR 0  MEWS LOC 0  MEWS Score 2  MEWS Score Color Yellow  Assess: if the MEWS score is Yellow or Red  Were vital signs taken at a resting state? Yes  Focused Assessment No change from prior assessment  Early Detection of Sepsis Score *See Row Information* Low  MEWS guidelines implemented *See Row Information* No, vital signs rechecked  Treat  Pain Scale 0-10  Pain Score 7  Pain Type Chronic pain  Pain Intervention(s) Refused  Take Vital Signs  Increase Vital Sign Frequency  Yellow: Q 2hr X 2 then Q 4hr X 2, if remains yellow, continue Q 4hrs  Escalate  MEWS: Escalate Yellow: discuss with charge nurse/RN and consider discussing with provider and RRT  Notify: Charge Nurse/RN  Name of Charge Nurse/RN Notified Crystan, B  Date Charge Nurse/RN Notified 10/14/20  Time Charge Nurse/RN Notified 2226  Notify: Provider  Provider Name/Title Dr. Everlene Farrier   Date Provider Notified 10/14/20  Time Provider Notified 2226  Notification Type Page  Notification Reason Other (Comment) (Yellow MEWS score continues to be the same )  Response See new orders  Date of Provider Response 10/14/20  Time of Provider Response 2227  Notify: Rapid Response  Name of Rapid Response RN Notified  (not notified at this time)  Document  Patient Outcome Not stable and remains on department  Progress note created (see row info) Yes  Yellow MEWS score continued. MD, change nurse, Nurse tech have been notified about the interventions started.

## 2020-10-15 ENCOUNTER — Inpatient Hospital Stay: Payer: Medicare HMO | Admitting: Certified Registered"

## 2020-10-15 ENCOUNTER — Encounter: Payer: Self-pay | Admitting: Surgery

## 2020-10-15 ENCOUNTER — Encounter
Admission: RE | Disposition: A | Discharge status: Home Health | Payer: Self-pay | Source: Home / Self Care | Attending: Surgery

## 2020-10-15 ENCOUNTER — Inpatient Hospital Stay: Payer: Medicare HMO

## 2020-10-15 ENCOUNTER — Telehealth: Payer: Self-pay

## 2020-10-15 DIAGNOSIS — K660 Peritoneal adhesions (postprocedural) (postinfection): Secondary | ICD-10-CM

## 2020-10-15 HISTORY — PX: LYSIS OF ADHESION: SHX5961

## 2020-10-15 HISTORY — PX: XI ROBOT ASSISTED DIAGNOSTIC LAPAROSCOPY: SHX6815

## 2020-10-15 LAB — CBC
HCT: 34.1 % — ABNORMAL LOW (ref 39.0–52.0)
Hemoglobin: 10.6 g/dL — ABNORMAL LOW (ref 13.0–17.0)
MCH: 25.9 pg — ABNORMAL LOW (ref 26.0–34.0)
MCHC: 31.1 g/dL (ref 30.0–36.0)
MCV: 83.2 fL (ref 80.0–100.0)
Platelets: 463 10*3/uL — ABNORMAL HIGH (ref 150–400)
RBC: 4.1 MIL/uL — ABNORMAL LOW (ref 4.22–5.81)
RDW: 14.9 % (ref 11.5–15.5)
WBC: 9.6 10*3/uL (ref 4.0–10.5)
nRBC: 0 % (ref 0.0–0.2)

## 2020-10-15 LAB — BASIC METABOLIC PANEL
Anion gap: 8 (ref 5–15)
BUN: 12 mg/dL (ref 6–20)
CO2: 25 mmol/L (ref 22–32)
Calcium: 8.2 mg/dL — ABNORMAL LOW (ref 8.9–10.3)
Chloride: 102 mmol/L (ref 98–111)
Creatinine, Ser: 0.72 mg/dL (ref 0.61–1.24)
GFR, Estimated: >60 mL/min (ref >60)
Glucose, Bld: 150 mg/dL — ABNORMAL HIGH (ref 70–99)
Potassium: 4.3 mmol/L (ref 3.5–5.1)
Sodium: 135 mmol/L (ref 135–145)

## 2020-10-15 LAB — GLUCOSE, CAPILLARY
Glucose-Capillary: 89 mg/dL (ref 70–99)
Glucose-Capillary: 131 mg/dL — ABNORMAL HIGH (ref 70–99)

## 2020-10-15 SURGERY — LAPAROSCOPY, DIAGNOSTIC, ROBOT-ASSISTED
Anesthesia: General | Site: Abdomen

## 2020-10-15 MED ORDER — HYDROMORPHONE HCL 1 MG/ML IJ SOLN
0.5000 mg | INTRAMUSCULAR | Status: DC | PRN
  Administered 2020-10-15 (×2): 0.5 mg via INTRAVENOUS

## 2020-10-15 MED ORDER — ROCURONIUM BROMIDE 10 MG/ML (PF) SYRINGE
PREFILLED_SYRINGE | INTRAVENOUS | Status: AC
  Filled 2020-10-15: qty 20

## 2020-10-15 MED ORDER — SEVOFLURANE IN SOLN
RESPIRATORY_TRACT | Status: AC
  Filled 2020-10-15: qty 250

## 2020-10-15 MED ORDER — SUCCINYLCHOLINE CHLORIDE 20 MG/ML IJ SOLN
INTRAMUSCULAR | Status: DC | PRN
  Administered 2020-10-15: 140 mg via INTRAVENOUS

## 2020-10-15 MED ORDER — ONDANSETRON HCL 4 MG/2ML IJ SOLN
INTRAMUSCULAR | Status: AC
  Filled 2020-10-15: qty 2

## 2020-10-15 MED ORDER — KETAMINE HCL 50 MG/ML IJ SOLN
INTRAMUSCULAR | Status: AC
  Filled 2020-10-15: qty 10

## 2020-10-15 MED ORDER — HYDROMORPHONE HCL 1 MG/ML IJ SOLN
1.0000 mg | INTRAMUSCULAR | Status: DC | PRN
  Administered 2020-10-16 – 2020-10-24 (×15): 1 mg via INTRAVENOUS
  Filled 2020-10-15 (×16): qty 1

## 2020-10-15 MED ORDER — FENTANYL CITRATE (PF) 100 MCG/2ML IJ SOLN
INTRAMUSCULAR | Status: AC
  Filled 2020-10-15: qty 2

## 2020-10-15 MED ORDER — PROPOFOL 10 MG/ML IV BOLUS
INTRAVENOUS | Status: AC
  Filled 2020-10-15: qty 20

## 2020-10-15 MED ORDER — HYDROMORPHONE HCL 1 MG/ML IJ SOLN
INTRAMUSCULAR | Status: AC
  Filled 2020-10-15: qty 1

## 2020-10-15 MED ORDER — PIPERACILLIN-TAZOBACTAM 3.375 G IVPB
INTRAVENOUS | Status: AC
  Administered 2020-10-16: 3.375 g via INTRAVENOUS
  Filled 2020-10-15: qty 50

## 2020-10-15 MED ORDER — ONDANSETRON HCL 4 MG/2ML IJ SOLN
INTRAMUSCULAR | Status: DC | PRN
  Administered 2020-10-15: 4 mg via INTRAVENOUS

## 2020-10-15 MED ORDER — HYDROMORPHONE HCL 1 MG/ML IJ SOLN
INTRAMUSCULAR | Status: DC | PRN
Start: 2020-10-15 — End: 2020-10-15
  Administered 2020-10-15: 1 mg via INTRAVENOUS
  Administered 2020-10-15 (×2): .5 mg via INTRAVENOUS

## 2020-10-15 MED ORDER — DEXAMETHASONE SODIUM PHOSPHATE 10 MG/ML IJ SOLN
INTRAMUSCULAR | Status: AC
  Filled 2020-10-15: qty 1

## 2020-10-15 MED ORDER — IOHEXOL 9 MG/ML PO SOLN
500.0000 mL | ORAL | Status: AC
  Administered 2020-10-15 (×2): 500 mL via ORAL

## 2020-10-15 MED ORDER — BUPIVACAINE-EPINEPHRINE (PF) 0.25% -1:200000 IJ SOLN
INTRAMUSCULAR | Status: AC
  Filled 2020-10-15: qty 30

## 2020-10-15 MED ORDER — IOHEXOL 300 MG/ML  SOLN
125.0000 mL | Freq: Once | INTRAMUSCULAR | Status: AC | PRN
  Administered 2020-10-15: 125 mL via INTRAVENOUS

## 2020-10-15 MED ORDER — FUROSEMIDE 20 MG PO TABS
20.0000 mg | ORAL_TABLET | Freq: Every day | ORAL | Status: DC
  Administered 2020-10-16 – 2020-10-24 (×9): 20 mg via ORAL
  Filled 2020-10-15 (×9): qty 1

## 2020-10-15 MED ORDER — SODIUM CHLORIDE 0.9 % IV SOLN
INTRAVENOUS | Status: DC | PRN
  Administered 2020-10-15: 50 ug/min via INTRAVENOUS

## 2020-10-15 MED ORDER — KETAMINE HCL 10 MG/ML IJ SOLN
INTRAMUSCULAR | Status: DC | PRN
  Administered 2020-10-15: 30 mg via INTRAVENOUS
  Administered 2020-10-15: 20 mg via INTRAVENOUS

## 2020-10-15 MED ORDER — DEXMEDETOMIDINE (PRECEDEX) IN NS 20 MCG/5ML (4 MCG/ML) IV SYRINGE
PREFILLED_SYRINGE | INTRAVENOUS | Status: AC
  Filled 2020-10-15: qty 5

## 2020-10-15 MED ORDER — TRACE MINERALS CU-MN-SE-ZN 300-55-60-3000 MCG/ML IV SOLN
INTRAVENOUS | Status: AC
  Filled 2020-10-15: qty 936

## 2020-10-15 MED ORDER — ROCURONIUM BROMIDE 10 MG/ML (PF) SYRINGE
PREFILLED_SYRINGE | INTRAVENOUS | Status: AC
  Filled 2020-10-15: qty 10

## 2020-10-15 MED ORDER — HYDROMORPHONE HCL 1 MG/ML IJ SOLN
INTRAMUSCULAR | Status: AC
Start: 2020-10-15 — End: ?
  Filled 2020-10-15: qty 1

## 2020-10-15 MED ORDER — SUGAMMADEX SODIUM 500 MG/5ML IV SOLN
INTRAVENOUS | Status: AC
  Filled 2020-10-15: qty 5

## 2020-10-15 MED ORDER — LACTATED RINGERS IV SOLN: INTRAVENOUS | Status: DC | PRN

## 2020-10-15 MED ORDER — ACETAMINOPHEN 10 MG/ML IV SOLN
INTRAVENOUS | Status: AC
  Filled 2020-10-15: qty 100

## 2020-10-15 MED ORDER — ALBUTEROL SULFATE HFA 108 (90 BASE) MCG/ACT IN AERS
INHALATION_SPRAY | RESPIRATORY_TRACT | Status: AC
  Filled 2020-10-15: qty 6.7

## 2020-10-15 MED ORDER — ROCURONIUM BROMIDE 100 MG/10ML IV SOLN
INTRAVENOUS | Status: DC | PRN
  Administered 2020-10-15: 20 mg via INTRAVENOUS
  Administered 2020-10-15: 50 mg via INTRAVENOUS
  Administered 2020-10-15 (×2): 20 mg via INTRAVENOUS
  Administered 2020-10-15: 30 mg via INTRAVENOUS
  Administered 2020-10-15: 20 mg via INTRAVENOUS

## 2020-10-15 MED ORDER — SUGAMMADEX SODIUM 500 MG/5ML IV SOLN
INTRAVENOUS | Status: DC | PRN
  Administered 2020-10-15: 300 mg via INTRAVENOUS

## 2020-10-15 MED ORDER — LIDOCAINE HCL (CARDIAC) PF 100 MG/5ML IV SOSY
PREFILLED_SYRINGE | INTRAVENOUS | Status: DC | PRN
  Administered 2020-10-15: 100 mg via INTRAVENOUS

## 2020-10-15 MED ORDER — PROPOFOL 10 MG/ML IV BOLUS
INTRAVENOUS | Status: DC | PRN
  Administered 2020-10-15: 20 mg via INTRAVENOUS
  Administered 2020-10-15: 130 mg via INTRAVENOUS

## 2020-10-15 MED ORDER — ACETAMINOPHEN 10 MG/ML IV SOLN
INTRAVENOUS | Status: DC | PRN
  Administered 2020-10-15: 1000 mg via INTRAVENOUS

## 2020-10-15 MED ORDER — DEXMEDETOMIDINE (PRECEDEX) IN NS 20 MCG/5ML (4 MCG/ML) IV SYRINGE
PREFILLED_SYRINGE | INTRAVENOUS | Status: DC | PRN
  Administered 2020-10-15 (×2): 8 ug via INTRAVENOUS
  Administered 2020-10-15 (×2): 12 ug via INTRAVENOUS

## 2020-10-15 MED ORDER — MIDAZOLAM HCL 2 MG/2ML IJ SOLN
INTRAMUSCULAR | Status: DC | PRN
  Administered 2020-10-15: 2 mg via INTRAVENOUS

## 2020-10-15 MED ORDER — SUCCINYLCHOLINE CHLORIDE 200 MG/10ML IV SOSY
PREFILLED_SYRINGE | INTRAVENOUS | Status: AC
  Filled 2020-10-15: qty 10

## 2020-10-15 MED ORDER — PROMETHAZINE HCL 25 MG/ML IJ SOLN
6.2500 mg | INTRAMUSCULAR | Status: DC | PRN
  Administered 2020-10-15: 12.5 mg via INTRAVENOUS

## 2020-10-15 MED ORDER — BUPIVACAINE-EPINEPHRINE 0.25% -1:200000 IJ SOLN
INTRAMUSCULAR | Status: DC | PRN
  Administered 2020-10-15: 2 mL
  Administered 2020-10-15: 26 mL

## 2020-10-15 MED ORDER — DEXAMETHASONE SODIUM PHOSPHATE 10 MG/ML IJ SOLN
INTRAMUSCULAR | Status: DC | PRN
  Administered 2020-10-15: 8 mg via INTRAVENOUS

## 2020-10-15 MED ORDER — BUPIVACAINE LIPOSOME 1.3 % IJ SUSP
INTRAMUSCULAR | Status: AC
  Filled 2020-10-15: qty 20

## 2020-10-15 MED ORDER — PROMETHAZINE HCL 25 MG/ML IJ SOLN
INTRAMUSCULAR | Status: AC
  Filled 2020-10-15: qty 1

## 2020-10-15 MED ORDER — FENTANYL CITRATE (PF) 100 MCG/2ML IJ SOLN
INTRAMUSCULAR | Status: DC | PRN
  Administered 2020-10-15: 50 ug via INTRAVENOUS
  Administered 2020-10-15: 100 ug via INTRAVENOUS
  Administered 2020-10-15: 50 ug via INTRAVENOUS

## 2020-10-15 MED ORDER — LIDOCAINE HCL (PF) 2 % IJ SOLN
INTRAMUSCULAR | Status: AC
  Filled 2020-10-15: qty 5

## 2020-10-15 MED ORDER — MIDAZOLAM HCL 2 MG/2ML IJ SOLN
INTRAMUSCULAR | Status: AC
  Filled 2020-10-15: qty 2

## 2020-10-15 MED ORDER — ALBUTEROL SULFATE HFA 108 (90 BASE) MCG/ACT IN AERS
INHALATION_SPRAY | RESPIRATORY_TRACT | Status: DC | PRN
  Administered 2020-10-15: 3 via RESPIRATORY_TRACT

## 2020-10-15 MED ORDER — SODIUM CHLORIDE 0.9 % IR SOLN
Status: DC | PRN
  Administered 2020-10-15: 5000 mL

## 2020-10-15 SURGICAL SUPPLY — 67 items
ADH SKN CLS APL DERMABOND .7 (GAUZE/BANDAGES/DRESSINGS)
APL PRP STRL LF DISP 70% ISPRP (MISCELLANEOUS) ×2
BAG SPEC RTRVL LRG 6X4 10 (ENDOMECHANICALS)
BULB RESERV EVAC DRAIN JP 100C (MISCELLANEOUS) ×4 IMPLANT
CANISTER SUCT 1200ML W/VALVE (MISCELLANEOUS) IMPLANT
CANISTER SUCT 3000ML PPV (MISCELLANEOUS) ×4 IMPLANT
CANNULA REDUC XI 12-8 STAPL (CANNULA)
CANNULA REDUC XI 12-8MM STAPL (CANNULA)
CANNULA REDUCER 12-8 DVNC XI (CANNULA) IMPLANT
CHLORAPREP W/TINT 26 (MISCELLANEOUS) ×4 IMPLANT
COVER TIP SHEARS 8 DVNC (MISCELLANEOUS) ×2 IMPLANT
COVER TIP SHEARS 8MM DA VINCI (MISCELLANEOUS) ×4
COVER WAND RF STERILE (DRAPES) ×5 IMPLANT
DECANTER SPIKE VIAL GLASS SM (MISCELLANEOUS) ×4 IMPLANT
DEFOGGER SCOPE WARMER CLEARIFY (MISCELLANEOUS) ×4 IMPLANT
DERMABOND ADVANCED (GAUZE/BANDAGES/DRESSINGS)
DERMABOND ADVANCED .7 DNX12 (GAUZE/BANDAGES/DRESSINGS) IMPLANT
DRAIN CHANNEL JP 19F (MISCELLANEOUS) ×3 IMPLANT
DRAPE ARM DVNC X/XI (DISPOSABLE) ×8 IMPLANT
DRAPE COLUMN DVNC XI (DISPOSABLE) ×2 IMPLANT
DRAPE DA VINCI XI ARM (DISPOSABLE) ×16
DRAPE DA VINCI XI COLUMN (DISPOSABLE) ×4
DRSG OPSITE POSTOP 3X4 (GAUZE/BANDAGES/DRESSINGS) ×16 IMPLANT
DRSG TEGADERM 2-3/8X2-3/4 SM (GAUZE/BANDAGES/DRESSINGS) ×4 IMPLANT
ELECT REM PT RETURN 9FT ADLT (ELECTROSURGICAL) ×4
ELECTRODE REM PT RTRN 9FT ADLT (ELECTROSURGICAL) ×2 IMPLANT
GLOVE ORTHO TXT STRL SZ7.5 (GLOVE) ×12 IMPLANT
GOWN STRL REUS W/ TWL LRG LVL3 (GOWN DISPOSABLE) ×8 IMPLANT
GOWN STRL REUS W/TWL LRG LVL3 (GOWN DISPOSABLE) ×16
GRASPER SUT TROCAR 14GX15 (MISCELLANEOUS) IMPLANT
HANDLE YANKAUER SUCT BULB TIP (MISCELLANEOUS) ×3 IMPLANT
INFUSOR MANOMETER BAG 3000ML (MISCELLANEOUS) ×4 IMPLANT
IRRIGATION STRYKERFLOW (MISCELLANEOUS) IMPLANT
IRRIGATOR STRYKERFLOW (MISCELLANEOUS)
IV NS 1000ML (IV SOLUTION) ×8
IV NS 1000ML BAXH (IV SOLUTION) IMPLANT
KIT OSTOMY DRAINABLE 3.25 STR (WOUND CARE) ×3 IMPLANT
KIT PINK PAD W/HEAD ARE REST (MISCELLANEOUS) ×4
KIT PINK PAD W/HEAD ARM REST (MISCELLANEOUS) ×2 IMPLANT
KIT TURNOVER KIT A (KITS) ×4 IMPLANT
KITTNER LAPARASCOPIC 5X40 (MISCELLANEOUS) ×3 IMPLANT
NEEDLE HYPO 22GX1.5 SAFETY (NEEDLE) ×4 IMPLANT
NS IRRIG 500ML POUR BTL (IV SOLUTION) ×4 IMPLANT
PACK LAP CHOLECYSTECTOMY (MISCELLANEOUS) ×4 IMPLANT
POUCH SPECIMEN RETRIEVAL 10MM (ENDOMECHANICALS) ×1 IMPLANT
SEAL CANN UNIV 5-8 DVNC XI (MISCELLANEOUS) ×6 IMPLANT
SEAL XI 5MM-8MM UNIVERSAL (MISCELLANEOUS) ×16
SET TRI-LUMEN FLTR TB AIRSEAL (TUBING) ×2 IMPLANT
SOL .9 NS 3000ML IRR  AL (IV SOLUTION) ×4
SOL .9 NS 3000ML IRR AL (IV SOLUTION) ×2
SOL .9 NS 3000ML IRR UROMATIC (IV SOLUTION) ×2 IMPLANT
SPONGE DRAIN TRACH 4X4 STRL 2S (GAUZE/BANDAGES/DRESSINGS) ×4 IMPLANT
STAPLER CANNULA SEAL DVNC XI (STAPLE) IMPLANT
STAPLER CANNULA SEAL XI (STAPLE)
STAPLER SKIN PROX 35W (STAPLE) ×4 IMPLANT
SUT ETHILON 3-0 FS-10 30 BLK (SUTURE) ×4
SUT MNCRL 4-0 (SUTURE) ×4
SUT MNCRL 4-0 27XMFL (SUTURE) ×2
SUT V-LOC 90 ABS 3-0 VLT  V-20 (SUTURE) ×4
SUT V-LOC 90 ABS 3-0 VLT V-20 (SUTURE) ×1 IMPLANT
SUT VICRYL 0 AB UR-6 (SUTURE) ×2 IMPLANT
SUT VICRYL 3-0 CR8 SH (SUTURE) ×2 IMPLANT
SUTURE EHLN 3-0 FS-10 30 BLK (SUTURE) ×1 IMPLANT
SUTURE MNCRL 4-0 27XMF (SUTURE) ×2 IMPLANT
TROCAR XCEL 12X100 BLDLESS (ENDOMECHANICALS) ×4 IMPLANT
TROCAR Z-THREAD FIOS 11X100 BL (TROCAR) ×1 IMPLANT
TUBING INSUFFLATION (TUBING) ×2 IMPLANT

## 2020-10-15 NOTE — Consult Note (Signed)
PHARMACY - TOTAL PARENTERAL NUTRITION CONSULT NOTE   Indication: Prolonged NPO  Patient Measurements: Height: 5\' 6"  (167.6 cm) Weight: (!) 140.3 kg (309 lb 4.9 oz) IBW/kg (Calculated) : 63.8 TPN AdjBW (KG): 80.3 Body mass index is 49.92 kg/m.  Assessment:  Patient is a 39 y/o M with medical history including morbid obesity, hypertension, diverticulitis who is POD # 14 from sigmoid colectomy for colovesical fistula. Post-operative course complicated by anastomotic dehiscence and surgical wound infection. Pharmacy has been consulted to initiate TPN for anticipated prolonged NPO status.   Glucose / Insulin: No history of diabetes. Normoglycemic Electrolytes: WNL Renal: Scr < 1 (stable and c/w baseline) LFTs / TGs: LFTs within normal limits. TG 156  Prealbumin / albumin: Albumin 2.6 on 10/15 Intake / Output; MIVF:  10/19 0701 - 10/20 0700 In: 2424.3 [I.V.:1692.3; IV Piggyback:727] Out: 2240 [Urine:2200; Drains:40]  GI Imaging: 10/12 CT Abdomen/Pelvis: 1. Postoperative findings of sigmoid colon resection and reanastomosis. There is a frank defect at the right aspect of the anastomosis with air and fluid tracking superiorly into the central small bowel mesentery. Although very elongated and irregular in shape, the largest, most central component of air and fluid in the central small bowel mesentery measures at least 13.3 x 4.8 cm. 2. The small bowel is diffusely fluid-filled without a distinctly identified transition point and there is gas present throughout the colon to the rectum. There are thickened loops of small bowel adjacent to air and fluid within the mesentery, likely reactive to adjacent infection and overall findings most consistent with ileus. 3. Trace free fluid in the low abdomen and pelvis in addition to minimal pneumoperitoneum. There is subcutaneous emphysema about the anterior left hemiabdomen and a small volume of emphysema about the lower esophagus. Findings are generally  in keeping with recent laparoscopy.  Surgeries / Procedures:  10/6 Robotic assisted sigmoid colectomy 10/12 I&D of post-operative wound infection  Central access: 10/14  TPN start date: 10/14   Nutritional Goals (per RD recommendation on 10/13): kCal: 2900-3200, Protein: 130-145g, Fluid: 2 - 2.3L Goal TPN rate is 90 mL/hr (provides 140 g of protein and 3000 kcals per day)  Current Nutrition:  Attempting CLD  Plan:   Continue TPN at 90 mL/hr   Nutritional Components:  Clinisol-amino acid 15%: 65g/L, 140.4g  Dextrose 21%, 453.6 g  Lipids 20%: 42 g/L, 90.8g  Calories: 3011.8 kCal  Electrolytes in TPN: standard, Cl:Ac ratio 1:1  Add standard MVI and trace elements to TPN  Continue MIVF at 10 mL/hr   Monitor TPN labs on Mon/Thurs since electrolytes have been stable  11/13 10/15/2020,6:57 AM

## 2020-10-15 NOTE — Telephone Encounter (Signed)
Patient wife Jordan [name])  is requesting a call from Dr.Rodenberg today regarding her husbands surgery and having to have a colostomy. [phone] call.

## 2020-10-15 NOTE — Plan of Care (Signed)
The patient has been educated on the new medication he has been started on tylenol and normal saline IV bolus. Yellow MEWS score protocol has been initiated for the patient do to heart rate being in the 120s. The surgical provider at night has order tylenol and 1L NS bolus for the patient. The patient heart rate has improved as well the patients temperature. No falls at this time. Respirations <20 per minute. No plans for discharge at this time. Vital signs guidelines per yelllow MEWS score are being followed. The patient is able to ambulate independently in the room and does not require any assistant device. Last BM reported 10/19. Pain managed with schedule and PRN pain medication. No new skin issues noticed at this time. CT to be perform this morning per CT service. MD has been notified.  Problem: Education: Goal: Knowledge of risk factors and measures for prevention of condition will improve Outcome: Progressing   Problem: Coping: Goal: Psychosocial and spiritual needs will be supported Outcome: Progressing   Problem: Respiratory: Goal: Will maintain a patent airway Outcome: Progressing Goal: Complications related to the disease process, condition or treatment will be avoided or minimized Outcome: Progressing   Problem: Education: Goal: Knowledge of General Education information will improve Description: Including pain rating scale, medication(s)/side effects and non-pharmacologic comfort measures Outcome: Progressing   Problem: Health Behavior/Discharge Planning: Goal: Ability to manage health-related needs will improve Outcome: Progressing   Problem: Clinical Measurements: Goal: Ability to maintain clinical measurements within normal limits will improve Outcome: Progressing Goal: Will remain free from infection Outcome: Progressing Goal: Diagnostic test results will improve Outcome: Progressing Goal: Respiratory complications will improve Outcome: Progressing Goal:  Cardiovascular complication will be avoided Outcome: Progressing   Problem: Activity: Goal: Risk for activity intolerance will decrease Outcome: Progressing   Problem: Nutrition: Goal: Adequate nutrition will be maintained Outcome: Progressing   Problem: Coping: Goal: Level of anxiety will decrease Outcome: Progressing   Problem: Elimination: Goal: Will not experience complications related to bowel motility Outcome: Progressing Goal: Will not experience complications related to urinary retention Outcome: Progressing   Problem: Pain Managment: Goal: General experience of comfort will improve Outcome: Progressing   Problem: Safety: Goal: Ability to remain free from injury will improve Outcome: Progressing

## 2020-10-15 NOTE — Consult Note (Signed)
WOC Nurse ostomy consult note WOC Nurse requested for preoperative stoma site marking by Dr. Claudine Mouton. Two sites are requested, once for colostomy and one for ileostomy.  Discussed surgical procedure and stoma creation with patient.  Explained role of the WOC nurse team. Answered patient questions.   Examined patient lying and semi-sitting in bed, in an attempt to place the marking in the patient's visual field, away from any creases or abdominal contour issues and within the rectus muscle. Obese, oft abdomen. Patient is in some degree of discomfort. The patient's belt line is very low on the abdomen, beneath the pannus.   Marked for colostomy in the LUQ  8 cm to the left of the umbilicus and 3cm above the umbilicus.  Marked for ileostomy in the RUQ  7.5cm to the right of the umbilicus and 3.5 cm above the umbilicus. The ileotomy marking is below and medial to the nonhealing stab wound from the laparoscopic procedure performed two weeks ago.   Patient's abdomen cleansed with CHG wipes at site markings, allowed to air dry prior to marking. Covered marks with thin film transparent dressings to preserve mark until time of surgery.   WOC Nurse team will follow up with patient after surgery for continued ostomy care and teaching should a stoma be created intraoperatively.  Thank you for the opportunity to meet and mark this patient. Ladona Mow, MSN, RN, GNP, [facility]  Pager# [phone]

## 2020-10-15 NOTE — Anesthesia Procedure Notes (Signed)
Procedure Name: Intubation Date/Time: 10/15/2020 5:09 PM Performed by: Jaye Beagle, CRNA Pre-anesthesia Checklist: Patient identified, Emergency Drugs available, Suction available and Patient being monitored Patient Re-evaluated:Patient Re-evaluated prior to induction Oxygen Delivery Method: Circle system utilized Preoxygenation: Pre-oxygenation with 100% oxygen Induction Type: IV induction Ventilation: Mask ventilation without difficulty Laryngoscope Size: McGraph and 4 Grade View: Grade II Tube type: Oral Tube size: 7.5 mm Number of attempts: 1 Airway Equipment and Method: Stylet and Oral airway Placement Confirmation: ETT inserted through vocal cords under direct vision,  positive ETCO2 and breath sounds checked- equal and bilateral Secured at: 22 cm Tube secured with: Tape Dental Injury: Teeth and Oropharynx as per pre-operative assessment

## 2020-10-15 NOTE — Transfer of Care (Signed)
Immediate Anesthesia Transfer of Care Note  Patient: Jordan [name]  Procedure(s) Performed: XI ROBOT ASSISTED DIAGNOSTIC LAPAROSCOPY, HARTMAN'S (N/A Abdomen) LYSIS OF ADHESION (Abdomen)  Patient Location: PACU  Anesthesia Type:General  Level of Consciousness: drowsy and patient cooperative  Airway & Oxygen Therapy: Patient Spontanous Breathing and Patient connected to face mask oxygen  Post-op Assessment: Report given to RN and Post -op Vital signs reviewed and stable  Post vital signs: Reviewed and stable  Last Vitals:  Vitals Value Taken Time  BP 172/88 10/15/20 2301  Temp 35.9 C 10/15/20 2255  Pulse 110 10/15/20 2301  Resp 15 10/15/20 2301  SpO2 100 % 10/15/20 2301  Vitals shown include unvalidated device data.  Last Pain:  Vitals:   10/15/20 2255  TempSrc:   PainSc: 10-Worst pain ever      Patients Stated Pain Goal: 5 (10/11/20 1855)  Complications: No complications documented.

## 2020-10-15 NOTE — Op Note (Signed)
Robotic assisted laparoscopic Hartman's procedure with extensive lysis of adhesions.  Pre-operative Diagnosis: Colorectal anastomotic leak  Post-operative Diagnosis: same.   Surgeon: [name], M.D., FACS  Assistant: Carolan Shiver, M.D., FACS  Anesthesia: General  Findings: Extensive adhesions throughout the abdominal cavity, extremely dense involving a drain site in the left lower quadrant and additional adhesions in the right lower quadrant the pelvic brim.  Adhesions surrounding area of pelvic abscess with noted third circumference anastomotic dehiscence.   Estimated Blood Loss: 300 mL         Specimens: None          Complications: none              Procedure Details  The patient was seen again in the Holding Room. The benefits, complications, treatment options, and expected outcomes were discussed with the patient. The risks of bleeding, infection, recurrence of symptoms, failure to resolve symptoms, unanticipated injury, prosthetic placement, prosthetic infection, any of which could require further surgery were reviewed with the patient. The likelihood of improving the patient's symptoms with return to their baseline status is hopeful.  The patient and/or family concurred with the proposed plan, giving informed consent.  The patient was taken to Operating Room, identified and the procedure verified.    Prior to the induction of general anesthesia, antibiotic prophylaxis was administered. VTE prophylaxis was in place.  General anesthesia was then administered and tolerated well. After the induction, the patient was positioned in the supine position and the abdomen was prepped with Chloraprep and draped in the sterile fashion.  A Time Out was held and the above information confirmed.  Having a known upper abdominal pneumoperitoneum on earlier CT scan from today, felt safe to enter via an 12 mm air seal optical port in the patient's left subcostal area.  We entered safely  found some space to place a second right upper quadrant paramedian robotic trocar. With scissors I was able to release additional anterior abdominal wall adhesions to allow placement of a third robotic trocar in the right upper quadrant. We were then able to proceed with docking the robot. Robotic lysis of adhesions was then ensued utilizing fenestrated bipolar and monopolar scissors maintaining excellent hemostasis mobilizing all the anterior abdominal adhesions. We identified the dense left lower quadrant adhesions surrounding the residual abscess cavity, and the drain present there.  Drain was completely freed and removed.  Proceeded with mobilization of adhesions all the way down into the pelvis, following the sigmoid colon into its colorectal anastomosis.  Once I identified the staple line of the anastomosis mobilized it with meticulous dissection utilizing suction irrigator tip.  Was then able to proceed with sharp division of the anastomosis at the staple line and then begin the pelvic mobilization of the colon. Again it took meticulous lengthy dissection to free the adhesions on all sides of the pelvis and along the retroperitoneum mobilizing the left colon back to a point where we could proceed with drawing it through the left abdominal wall for an end colostomy. There is a thickened extent of mesocolon which was pared down to allow passage of the distal segment through the abdominal wall. I then proceeded with closure of the rectal stump with a running 3 OV lock. We then transferred the into a laparoscopic grasper which was placed through an  assistant port in the right lower quadrant. We then made a transverse incision in the anterior abdominal wall after undocking the robot.  We carried it down through the subcutaneous  tissues to the rectus sheath which was incised in a cruciate manner and dilated to accommodate the distal colon.  With meticulous mobilization of the remaining colon we were able  to bring it to the surface of the skin but not evert it.  We then matured it at the skin level with interrupted 3-0 Vicryl's and applied an appliance to resume the laparoscopic abdominal washout. We then utilized a nearly 5 L of sterile saline solution to irrigate and aspirate the abdominal pelvic contents.  Placed a 19 Blake drain through the right lower quadrant port and placed it deep in the pelvis over the rectal closure.  Was secured to the skin with 3-0 nylon. After further evaluation, considering the duration as patient was under anesthesia felt it prudent to proceed with closure. We then desufflated the abdomen remove the trochars and closed the skin with staples.  Sterile dressings were applied. Patient appeared to tolerate procedure well.     [name] M.D., [facility] 10/15/2020 10:26 PM

## 2020-10-15 NOTE — Progress Notes (Addendum)
ADDENDUM 11:30 AM  Personally reviewed CT Abdomen/Pelvis with Dr Claudine Mouton, MD which is concerning for persistent, and most likely, widening of his anastomotic leak. He does appear have formed a fistulous tract to his percutaneously placed drain. Persistent pneumoperitoneum. Unfortunately, and despite his clinical progress, it is felt he will likely not have long term success with conservative management of this. At this time, we will proceed with most anastomotic takedown and end colostomy creation. Diverting ileostomy is possibility however leak appear large and may not close with diversion. We will attempt this robotically if feasible with exploratory laparotomy as a possibility.All risks, benefits, and alternatives to above procedure(s) were discussed with the patient, all of his questions were answered to his expressed satisfaction, patient expresses he wishes to proceed, and informed consent was obtained. We will also ask WOC for colostomy markings.    [facility] SURGICAL PROGRESS NOTE  Hospital Day(s): 14.   Post op day(s): 14 Days Post-Op.   Interval History:  Patient seen and examined Last yesterday afternoon he had issues with tachycardia and fever, T-Max 100.6.  Patient reports he is still having some suprapubic abdominal pain, relatively unchanged, intermittent No nausea or emesis Leukocytosis remains resolved this morning at 9.6K Renal function remains normal, sCr - 0.72, UO - 2.2L No electrolyte derangements Repeat CT Abdomen/Pelvis is pending  Percutaneous drain with 40 ccs out, this appears more feculent this morning Culture from drain placement and WoundCx obtained during I&D--> both growing E coli (sensitive to Zosyn); and and enterococcus (resistant to ampicillin); vancomycin added on 10/19 I did back him down to NPO last night given fever and tachycardia He continues to have bowel function  She has not been mobilizing much aside from to the bathroom.    Vital signs in last 24 hours: [min-max] current  Temp:  [98 F (36.7 C)-100.6 F (38.1 C)] 98 F (36.7 C) (10/20 0457) Pulse Rate:  [90-123] 90 (10/20 0457) Resp:  [16-18] 17 (10/20 0457) BP: (124-151)/(71-97) 124/71 (10/20 0457) SpO2:  [98 %-100 %] 98 % (10/20 0457) Weight:  [140.3 kg] 140.3 kg (10/20 0500)     Height: 5\' 6"  (167.6 cm) Weight: (!) 140.3 kg BMI (Calculated): 49.95   Intake/Output last 2 shifts:  10/19 0701 - 10/20 0700 In: 2424.3 [I.V.:1692.3; IV Piggyback:727] Out: 2240 [Urine:2200; Drains:40]   Physical Exam:  Constitutional: alert, cooperative and no distress  Respiratory: breathing non-labored at rest  Cardiovascular: regular rate and sinus rhythm  Gastrointestinal:Soft,no appreciable tenderness this morning, non-distended, no rebound/guarding.Certainly without overt peritonitis, Drain inLLQwith more feculent appearing output Integumentary:The middle right laparoscopic incision iss/o I&D, packing in place, surroundingerythema and induration improved, no expressible drainage. Musculoskeletal: 2+ pitting edema  Labs:  CBC Latest Ref Rng & Units 10/14/2020 10/13/2020 10/11/2020  WBC 4.0 - 10.5 K/uL 9.8 12.7(H) 13.3(H)  Hemoglobin 13.0 - 17.0 g/dL 11.2(L) 11.1(L) 11.1(L)  Hematocrit 39 - 52 % 35.1(L) 35.2(L) 35.2(L)  Platelets 150 - 400 K/uL 523(H) 535(H) 520(H)   CMP Latest Ref Rng & Units 10/15/2020 10/13/2020 10/12/2020  Glucose 70 - 99 mg/dL 10/14/2020) 283(T) 517(O)  BUN 6 - 20 mg/dL 12 11 9   Creatinine 0.61 - 1.24 mg/dL 160(V 3.71  Sodium 135 - 145 mmol/L 135 139 145  Potassium 3.5 - 5.1 mmol/L 4.3 4.2 4.1  Chloride 98 - 111 mmol/L 102 102 106  CO2 22 - 32 mmol/L 25 29 29   Calcium 8.9 - 10.3 mg/dL 8.2(L) 8.3(L) 8.5(L)  Total Protein 6.5 - 8.1 g/dL -  6.4(L) -  Total Bilirubin 0.3 - 1.2 mg/dL - 0.6 -  Alkaline Phos 38 - 126 U/L - 91 -  AST 15 - 41 U/L - 22 -  ALT 0 - 44 U/L - 19 -     Imaging studies: No new pertinent imaging  studies   Assessment/Plan:  39 y.o. [name] 31 Days Post-Op s/p robotic assisted sigmoid colectomyfor complicated diverticulitis with colovesical fistula, complicated by anastomotic leak s/p percutaneous drainage on 10/15   - Will repeat CT Abdomen/Pelvis this morning given fever over night.   - ContinueTPN, IVFat goal rate today - ContinueIV ABx (Zosyn); follow up Cx obtained during I&D ad drain placement--> growing E coli (sensitive to Zosyn); and and enterococcus (resistant to ampicillin) --> will add vancomycin; Day 2 - Continue wound care: remove packing daily, replace, secure with gauze. This can be done PRN as well - Monitor abdominal examination closely - Monitor leukocytosis; fever curve - No emergent surgical re-intervention; potentially may needanastomotic takedown/colostomy -vs-diverting loop ileostomy should clinical condition worsen, additional options include drain placement with IR if feasible             - Continue current percutaneous drain; monitor and record output - Pain control prn; antiemetics prn - Mobilization encouraged - Home medications  All of the above findings and recommendations were discussed with the patient, patient's family (wife at bedside), and the medical team, and all of patient's and family's questions were answered to their expressed satisfaction.  -- Lynden Oxford, PA-C [facility] 10/15/2020, 7:56 AM [phone] M-F: 7am - 4pm

## 2020-10-15 NOTE — Anesthesia Preprocedure Evaluation (Signed)
Anesthesia Evaluation  Patient identified by MRN, date of birth, ID band Patient awake    Reviewed: Allergy & Precautions, H&P , NPO status , Patient's Chart, lab work & pertinent test results  History of Anesthesia Complications Negative for: history of anesthetic complications  Airway Mallampati: III  TM Distance: >3 FB     Dental  (+) Teeth Intact   Pulmonary asthma , sleep apnea and Continuous Positive Airway Pressure Ventilation , neg COPD, former smoker,    breath sounds clear to auscultation       Cardiovascular hypertension, (-) angina(-) Past MI and (-) Cardiac Stents (-) dysrhythmias  Rhythm:regular Rate:Normal     Neuro/Psych PSYCHIATRIC DISORDERS Anxiety Depression negative neurological ROS     GI/Hepatic negative GI ROS, (+)     substance abuse  marijuana use,   Endo/Other  Morbid obesity  Renal/GU      Musculoskeletal   Abdominal   Peds  Hematology negative hematology ROS (+)   Anesthesia Other Findings Past Medical History: No date: Anxiety and depression No date: Asthma No date: Chronic pain syndrome No date: History of kidney stones No date: HYPERTENSION, MILD No date: OSTEOARTHRITIS No date: Recurrent UTI No date: Sleep apnea  Past Surgical History: 09/30/2020: COLONOSCOPY WITH PROPOFOL; N/A     Comment:  Procedure: COLONOSCOPY WITH PROPOFOL;  Surgeon: Midge Minium, MD;  Location: [facility] ENDOSCOPY;  Service:               Endoscopy;  Laterality: N/A; No date: JOINT REPLACEMENT No date: KNEE SURGERY; Right     Comment:  7 total surgeries No date: REPLACEMENT TOTAL KNEE; Right  BMI    Body Mass Index: 49.92 kg/m      Reproductive/Obstetrics negative OB ROS                             Anesthesia Physical Anesthesia Plan  ASA: III  Anesthesia Plan: General ETT and Modified Rapid Sequence   Post-op Pain Management:    Induction:   PONV  Risk Score and Plan: Ondansetron, Dexamethasone, Midazolam and Treatment may vary due to age or medical condition  Airway Management Planned:   Additional Equipment:   Intra-op Plan:   Post-operative Plan:   Informed Consent: I have reviewed the patients History and Physical, chart, labs and discussed the procedure including the risks, benefits and alternatives for the proposed anesthesia with the patient or authorized representative who has indicated his/her understanding and acceptance.     Dental Advisory Given  Plan Discussed with: Anesthesiologist, CRNA and Surgeon  Anesthesia Plan Comments:         Anesthesia Quick Evaluation

## 2020-10-16 ENCOUNTER — Encounter: Payer: Self-pay | Admitting: Surgery

## 2020-10-16 ENCOUNTER — Telehealth: Payer: Self-pay | Admitting: Surgery

## 2020-10-16 LAB — CBC
HCT: 35 % — ABNORMAL LOW (ref 39.0–52.0)
Hemoglobin: 11 g/dL — ABNORMAL LOW (ref 13.0–17.0)
MCH: 26.3 pg (ref 26.0–34.0)
MCHC: 31.4 g/dL (ref 30.0–36.0)
MCV: 83.7 fL (ref 80.0–100.0)
Platelets: 450 10*3/uL — ABNORMAL HIGH (ref 150–400)
RBC: 4.18 MIL/uL — ABNORMAL LOW (ref 4.22–5.81)
RDW: 15.2 % (ref 11.5–15.5)
WBC: 14.5 10*3/uL — ABNORMAL HIGH (ref 4.0–10.5)
nRBC: 0 % (ref 0.0–0.2)

## 2020-10-16 LAB — COMPREHENSIVE METABOLIC PANEL
ALT: 23 U/L (ref 0–44)
AST: 24 U/L (ref 15–41)
Albumin: 2.4 g/dL — ABNORMAL LOW (ref 3.5–5.0)
Alkaline Phosphatase: 78 U/L (ref 38–126)
Anion gap: 9 (ref 5–15)
BUN: 10 mg/dL (ref 6–20)
CO2: 27 mmol/L (ref 22–32)
Calcium: 8.1 mg/dL — ABNORMAL LOW (ref 8.9–10.3)
Chloride: 107 mmol/L (ref 98–111)
Creatinine, Ser: 0.8 mg/dL (ref 0.61–1.24)
GFR, Estimated: >60 mL/min (ref >60)
Glucose, Bld: 236 mg/dL — ABNORMAL HIGH (ref 70–99)
Potassium: 4.9 mmol/L (ref 3.5–5.1)
Sodium: 143 mmol/L (ref 135–145)
Total Bilirubin: 0.4 mg/dL (ref 0.3–1.2)
Total Protein: 5.9 g/dL — ABNORMAL LOW (ref 6.5–8.1)

## 2020-10-16 LAB — PHOSPHORUS: Phosphorus: 2.5 mg/dL (ref 2.5–4.6)

## 2020-10-16 LAB — MAGNESIUM: Magnesium: 2.2 mg/dL (ref 1.7–2.4)

## 2020-10-16 MED ORDER — TRACE MINERALS CU-MN-SE-ZN 300-55-60-3000 MCG/ML IV SOLN
INTRAVENOUS | Status: AC
  Filled 2020-10-16: qty 936

## 2020-10-16 MED ORDER — OXYCODONE HCL 5 MG PO TABS
10.0000 mg | ORAL_TABLET | ORAL | Status: DC | PRN
  Administered 2020-10-16 – 2020-10-24 (×46): 10 mg via ORAL
  Filled 2020-10-16 (×48): qty 2

## 2020-10-16 MED ORDER — GABAPENTIN 300 MG PO CAPS
300.0000 mg | ORAL_CAPSULE | Freq: Three times a day (TID) | ORAL | Status: DC
  Administered 2020-10-16 – 2020-10-24 (×24): 300 mg via ORAL
  Filled 2020-10-16 (×24): qty 1

## 2020-10-16 NOTE — Consult Note (Addendum)
PHARMACY - TOTAL PARENTERAL NUTRITION CONSULT NOTE   Indication: Prolonged NPO  Patient Measurements: Height: 5\' 6"  (167.6 cm) Weight: (!) 140.3 kg (309 lb 4.9 oz) IBW/kg (Calculated) : 63.8 TPN AdjBW (KG): 80.3 Body mass index is 49.92 kg/m.  Assessment:  Patient is a 39 y/o M with medical history including morbid obesity, hypertension, diverticulitis who is POD # 14 from sigmoid colectomy for colovesical fistula. Post-operative course complicated by anastomotic dehiscence and surgical wound infection. Pharmacy has been consulted to initiate TPN for anticipated prolonged NPO status.   Glucose / Insulin: No history of diabetes. Normoglycemic Electrolytes: WNL Renal: Scr < 1 (stable and c/w baseline) LFTs / TGs: LFTs within normal limits. TG 156  Prealbumin / albumin: Albumin 2.6 on 10/15 Intake / Output; MIVF:  10/19 0701 - 10/20 0700 In: 2424.3 [I.V.:1692.3; IV Piggyback:727] Out: 2240 [Urine:2200; Drains:40]  GI Imaging: 10/12 CT Abdomen/Pelvis: 1. Postoperative findings of sigmoid colon resection and reanastomosis. There is a frank defect at the right aspect of the anastomosis with air and fluid tracking superiorly into the central small bowel mesentery. Although very elongated and irregular in shape, the largest, most central component of air and fluid in the central small bowel mesentery measures at least 13.3 x 4.8 cm. 2. The small bowel is diffusely fluid-filled without a distinctly identified transition point and there is gas present throughout the colon to the rectum. There are thickened loops of small bowel adjacent to air and fluid within the mesentery, likely reactive to adjacent infection and overall findings most consistent with ileus. 3. Trace free fluid in the low abdomen and pelvis in addition to minimal pneumoperitoneum. There is subcutaneous emphysema about the anterior left hemiabdomen and a small volume of emphysema about the lower esophagus. Findings are generally  in keeping with recent laparoscopy. 10/15 CT Abdomen/Pelvis: 1. Signs of persistent anastomotic dehiscence of status post sigmoid resection. 2. Dilated loops of small bowel noted which are favored to represent ileus. 10/20 CT Abdomen/Pelvis: 1.Worsening small bowel ileus and signs of enteritis likely related to peritoneal and mesenteric inflammation. 2. Interval placement of a LEFT lower quadrant drain with decompression of some of the areas of fluid and gas in the pelvis but with persistent large volume pneumoperitoneum and layering of fluid within this pneumoperitoneum in the mid abdomen. Findings are compatible with diffuse dissemination of leaked bowel contents into the upper abdomen.  Surgeries / Procedures:  10/6 Robotic assisted sigmoid colectomy 10/12 I&D of post-operative wound infection 10/20 laparoscopic Hartman's Procedure   Central access: 10/09/20  TPN start date: 10/09/20   Nutritional Goals (per RD recommendation on 10/13): kCal: 2900-3200, Protein: 130-145g, Fluid: 2 - 2.3L Goal TPN rate is 90 mL/hr (provides 140 g of protein and 3000 kcals per day)  Current Nutrition:  Attempting CLD  Plan:   Continue TPN at 90 mL/hr   Nutritional Components:  Clinisol-amino acid 15%: 65g/L, 140.4g  Dextrose 21%, 453.6 g  Lipids 20%: 42 g/L, 90.8g  Calories: 3011.8 kCal  Electrolytes in TPN: standard, Cl:Ac ratio 1:1  Add standard MVI and trace elements to TPN  Continue MIVF at 10 mL/hr   Monitor TPN labs on Mon/Thurs since electrolytes have been stable  11/13 10/16/2020,6:59 AM

## 2020-10-16 NOTE — Consult Note (Addendum)
Pharmacy Antibiotic Note  Jordan [name] is a 39 y.o. male admitted on 10/01/2020 with diverticulitis now 13 Days Post-Op s/p robotic assisted sigmoid colectomy.  Pharmacy has been consulted for vancomycin and Zosyn dosing. His wound culture has grown out ampicillin-resistant Enterococcus.  Today, 10/16/2020  Day 3 vancomycin, Day 10 pip/tazo  Patient back to OR last evening d/t persistent leak at anastomosis s/p colostomy  SCr stable  WBC increased this am, likely stress response from surgery last evening  Afebrile  10/21 21:30 vancomycin trough = ____  Mcg/mL on 1gm IV q8h (please note the 2200 dose 10/20 was not given as patient in OR (do not see indication that dose given in OR)  Plan:  1) Continue vancomycin 1000 mg IV Q 8 hrs - check vancomycin trough this evening.  Goal trough 15-20 mcg/ml, prefer to keep closer to 15 mcg/ml  2) continue Zosyn 3.375g IV q8h (4 hour infusion).  3) consider ID consult to help guide antibiotic therapy in this complicated case  Height: 5\' 6"  (167.6 cm) Weight: (!) 140.3 kg (309 lb 4.9 oz) IBW/kg (Calculated) : 63.8  Temp (24hrs), Avg:97.7 F (36.5 C), Min:96.6 F (35.9 C), Max:98.3 F (36.8 C)  Recent Labs  Lab 10/10/20 0513 10/11/20 0449 10/11/20 0450 10/12/20 0507 10/13/20 0539 10/14/20 0451 10/15/20 0640 10/16/20 0607  WBC  --  13.3*  --   --  12.7* 9.8 9.6 14.5*  CREATININE   < >  --  0.68 0.73 0.77  --  0.72 0.80   < > = values in this interval not displayed.    Estimated Creatinine Clearance: 163.9 mL/min (by C-G formula based on SCr of 0.8 mg/dL).    Allergies  Allergen Reactions  . Butrans [Buprenorphine Hcl]     Difficulty breathing, n/v  . Nucynta [Tapentadol Hydrochloride] Anaphylaxis  . Tapentadol Hcl Anaphylaxis  . Glucose Polymer Other (See Comments)    poison  . [place]ac     poison  . Bactrim [Sulfamethoxazole-Trimethoprim] Itching and Rash    Antimicrobials this admission: 10/12 Zosyn >>   10/19 vancomycin >>   Microbiology results: 10/09 SARS CoV-2: negative  10/09 influenza A/B: negative 10/15 WCx: E coli, E raffinosus, beta lactamase (+) 10/12 WCx: E coli  Thank you for allowing pharmacy to be a part of this patient's care.  12/12, PharmD, BCPS.   Work Cell: [phone] 10/16/2020 8:57 AM

## 2020-10-16 NOTE — Telephone Encounter (Signed)
Patient's wife calls.  Her husband is currently in the hospital and she is asking [place] to intervene with pain medication treatment.  I have mentioned to her that she will need to check with the nurse who is attending with her husband's post op care while he is still in the hospital and they will coordinate any orders and/or request with the doctor.  She seemed frustrated and hung up.

## 2020-10-16 NOTE — Progress Notes (Signed)
   10/16/20 2122  Assess: MEWS Score  Temp 99.7 F (37.6 C)  BP (!) 144/91  Pulse Rate (!) 122  Resp 18  Level of Consciousness Alert  SpO2 99 %  O2 Device Room Air  Assess: MEWS Score  MEWS Temp 0  MEWS Systolic 0  MEWS Pulse 2  MEWS RR 0  MEWS LOC 0  MEWS Score 2  MEWS Score Color Yellow  Assess: if the MEWS score is Yellow or Red  Were vital signs taken at a resting state? Yes  Focused Assessment No change from prior assessment  Early Detection of Sepsis Score *See Row Information* Medium  MEWS guidelines implemented *See Row Information* No, previously yellow, continue vital signs every 4 hours   No further interventions for now. Will continue Q4 vitals

## 2020-10-16 NOTE — Care Management Important Message (Signed)
Important Message  Patient Details  Name: Jordan [name] MRN: [id number] Date of Birth: [birth year]   Medicare Important Message Given:  Yes     [name] 10/16/2020, 1:32 PM

## 2020-10-16 NOTE — Progress Notes (Signed)
[facility] SURGICAL PROGRESS NOTE   Hospital Day(s): 15.   Post op day(s): 1 Day Post-Op.   Interval History:  Patient seen and examined no acute events or new complaints overnight.  Patient reports he is diffusely sore throughout his abdomen, worse with movement, pain medications helping briefly No fever, chills, nausea, emesis. Asking to have liquids Bump in leukocytosis this morning to 14.5k; most likely reactive from surgery Hgb stable at 11.0 Renal function remains normal, sCr - 0.80, UO - 6.1L No significant electrolyte derangements present Culture from drain placement andWoundCx obtained during I&D-->bothgrowing E coli(sensitive to Zosyn); and and enterococcus (resistant to ampicillin); vancomycin added on 10/19 Newly placed surgical drain with 200 ccs out in last 24 hours, serosanguinous  NPO this morning following procedure Remains on TPN, at goal rate  Vital signs in last 24 hours: [min-max] current  Temp:  [96.6 F (35.9 C)-98.3 F (36.8 C)] 97.9 F (36.6 C) (10/21 0436) Pulse Rate:  [97-116] 102 (10/21 0436) Resp:  [14-20] 18 (10/21 0436) BP: (131-172)/(71-102) 143/89 (10/21 0436) SpO2:  [93 %-100 %] 100 % (10/21 0436)     Height: 5\' 6"  (167.6 cm) Weight: (!) 140.3 kg BMI (Calculated): 49.95   Intake/Output last 2 shifts:  10/20 0701 - 10/21 0700 In: 3700.1 [I.V.:3111.3; IV Piggyback:588.7] Out: 6650 [Urine:6150; Drains:200; Blood:300]   Physical Exam:  Constitutional: alert, cooperative and no distress  Respiratory: breathing non-labored at rest  Cardiovascular: regular rate and sinus rhythm  Gastrointestinal:Soft,diffuse soreness, no appreciable distension, no rebound/guarding. Colostomy in left mid-abdomen, bowel sweat in bag, JP in RLQ with serosanguinous output Integumentary:Laparoscopic incisions now with skin staples and honeycomb, no appreciable erythema Musculoskeletal: 2+ pitting edema   Labs:  CBC Latest Ref Rng & Units  10/16/2020 10/15/2020 10/14/2020  WBC 4.0 - 10.5 K/uL 14.5(H) 9.6 9.8  Hemoglobin 13.0 - 17.0 g/dL 11.0(L) 10.6(L) 11.2(L)  Hematocrit 39 - 52 % 35.0(L) 34.1(L) 35.1(L)  Platelets 150 - 400 K/uL 450(H) 463(H) 523(H)   CMP Latest Ref Rng & Units 10/15/2020 10/13/2020 10/12/2020  Glucose 70 - 99 mg/dL 10/14/2020) 629(U) 765(Y)  BUN 6 - 20 mg/dL 12 11 9   Creatinine 0.61 - 1.24 mg/dL 650(P 5.46  Sodium 135 - 145 mmol/L 135 139 145  Potassium 3.5 - 5.1 mmol/L 4.3 4.2 4.1  Chloride 98 - 111 mmol/L 102 102 106  CO2 22 - 32 mmol/L 25 29 29   Calcium 8.9 - 10.3 mg/dL 8.2(L) 8.3(L) 8.5(L)  Total Protein 6.5 - 8.1 g/dL - 6.4(L) -  Total Bilirubin 0.3 - 1.2 mg/dL - 0.6 -  Alkaline Phos 38 - 126 U/L - 91 -  AST 15 - 41 U/L - 22 -  ALT 0 - 44 U/L - 19 -     Imaging studies: No new pertinent imaging studies   Assessment/Plan: ( 39 y.o. male 1 Day Post-Op s/p robotic assisted laparoscopic Hartman's Procedure for anastomotic dehiscence following initial robotic assisted sigmoid colectomyon 10/06.   - Okay to trial CLD this morning  - Continue TPN at goal rate for now; continue until on soft diet with return of bowel function   - Continue foley for now; he is <12 hours post-op and suspect mobility will be a challenge this mornign  - ContinueIV ABx (Zosyn); follow up Cx obtained during I&Dad drain placement--> growing E coli(sensitive to Zosyn); and and enterococcus (resistant to ampicillin) --> will add vancomycin;Day 3/10  - Monitor abdominal examination closely - Monitor leukocytosis; fever curve   -  Continue current percutaneous drain; monitor and record output - Pain control prn; antiemetics prn - Mobilization encouraged; will engage PT in next 24/48 hours - Home medications   All of the above findings and recommendations were discussed with the patient*, and the medical team, and all of patient's questions were answered to  his expressed satisfaction.  -- Lynden Oxford, PA-C [facility] 10/16/2020, 7:29 AM [phone] M-F: 7am - 4pm

## 2020-10-16 NOTE — Consult Note (Addendum)
WOC Nurse ostomy follow up Pt states his wife will be coming in later for a teaching session and I will plan to return at 11:00 as requested.  Post note 11:00:  Pt had laparoscopic colostomy surgery performed yesterday  Stoma type/location: Left abd with dark red, flush open area is the skin 2 1/8 inches; unable to visualize stoma. Peristomal assessment: intact skin surrounding Output: small amt bloody drainage, no stool or flatus  Ostomy pouching: 1pc flexible 2 1/8 inch Gigi Gin # [phone]   Education provided: Demonstrated pouch change to patient and wife at the bedside. Wife states she is familiar with pouch application since her grandmother had an ostomy and she assisted with the care. Applied one piece flexible convex pouch with barrier ring to attempt to maintain a seal.  Pt may need to use a belt later when he is more active.  Pt and wife asked appropriate questions and both were able to open and close the velcro to empty.  Educational materials left at the bedside and 5 sets of barrier rings and one piece flexible convex pouches and one belt. Discussed pouching routines and ordering supplies. Enrolled patient in [facility] [employer] Discharge program: Yes; requested one piece flexible convex, 2 piece flexible convex, barrier rings, and a belt. WOC team will continue to follow for further teaching sessions.  Cammie Mcgee MSN, RN, CWOCN, [place], CNS [phone]

## 2020-10-17 LAB — BASIC METABOLIC PANEL
Anion gap: 11 (ref 5–15)
BUN: 10 mg/dL (ref 6–20)
CO2: 23 mmol/L (ref 22–32)
Calcium: 8 mg/dL — ABNORMAL LOW (ref 8.9–10.3)
Chloride: 106 mmol/L (ref 98–111)
Creatinine, Ser: 0.8 mg/dL (ref 0.61–1.24)
GFR, Estimated: >60 mL/min (ref >60)
Glucose, Bld: 140 mg/dL — ABNORMAL HIGH (ref 70–99)
Potassium: 4.7 mmol/L (ref 3.5–5.1)
Sodium: 140 mmol/L (ref 135–145)

## 2020-10-17 LAB — VANCOMYCIN, TROUGH: Vancomycin Tr: 10 ug/mL — ABNORMAL LOW (ref 15–20)

## 2020-10-17 LAB — CBC
HCT: 39.7 % (ref 39.0–52.0)
Hemoglobin: 11.6 g/dL — ABNORMAL LOW (ref 13.0–17.0)
MCH: 26.1 pg (ref 26.0–34.0)
MCHC: 29.2 g/dL — ABNORMAL LOW (ref 30.0–36.0)
MCV: 89.2 fL (ref 80.0–100.0)
Platelets: 198 10*3/uL (ref 150–400)
RBC: 4.45 MIL/uL (ref 4.22–5.81)
RDW: 15.6 % — ABNORMAL HIGH (ref 11.5–15.5)
WBC: 13.5 10*3/uL — ABNORMAL HIGH (ref 4.0–10.5)
nRBC: 0 % (ref 0.0–0.2)

## 2020-10-17 MED ORDER — LIDOCAINE 5 % EX PTCH
1.0000 | MEDICATED_PATCH | CUTANEOUS | Status: DC
  Filled 2020-10-17 (×8): qty 1

## 2020-10-17 MED ORDER — VANCOMYCIN HCL 1250 MG/250ML IV SOLN
1250.0000 mg | Freq: Three times a day (TID) | INTRAVENOUS | Status: DC
  Administered 2020-10-17 – 2020-10-24 (×20): 1250 mg via INTRAVENOUS
  Filled 2020-10-17 (×25): qty 250

## 2020-10-17 MED ORDER — TRACE MINERALS CU-MN-SE-ZN 300-55-60-3000 MCG/ML IV SOLN
INTRAVENOUS | Status: AC
  Filled 2020-10-17: qty 936

## 2020-10-17 NOTE — Evaluation (Signed)
Physical Therapy Re-Evaluation Patient Details Name: Jordan [name] MRN: [id number] DOB: Dec 25, 1981 Today's Date: 10/17/2020   History of Present Illness  Jordan [name] is a 39 y.o. male 16 days Post-Op s/p robotic assisted sigmoid colectomy for complicated diverticulitis with colovesical fistula, complicated by pertinent comorbidities including COVID+ and post-op wound infection. Pt s/p I&D of surgical site, currently on TPN  for anticipated prolonged NPO status. Recently began trial of clear liquids. Pt underwent laparscopic Hartman's procedure on 10/20.  Clinical Impression  Pt received lying in bed with HOB elevated with OT finishing up. Pt reports high levels of pain at this time and defers OOB/upright activities however is agreeable to bed level evaluation. Pt reporting 7/10 abdominal pain lying in bed. Pt with decreased mobility overall as all movements are reported to cause abdominal pain. Pt performed therex in bed for promotion of muscle strengthening and joint maintenance. Pt's IV began to beep with screen indicating "occlusion" - pt then reached over to IV and pressed a button on the left and then the right facing side and beeping ceased. Nurse notified. Pt performed partial roll to L self-initiating however unable to complete full motion as he was limited by pain. Anticipate pt will require heavy assistance, possibly +2 assist, for supine <> sit and potentially transfers and ambulation secondary to increased abdominal pain. Pain was a limiting factor during evaluation. Pt would benefit from skilled PT to address current deficits of strength, balance, pain, functional activity tolerance and functional mobility. At this time, recommend STR to optimize return to PLOF and maximize functional mobility and safety/independence with activities.    Follow Up Recommendations SNF    Equipment Recommendations  None recommended by PT;Other (comment) (pt has equipment)     Recommendations for Other Services       Precautions / Restrictions Precautions Precautions: Fall Restrictions Weight Bearing Restrictions: No      Mobility  Bed Mobility Overal bed mobility: Needs Assistance Bed Mobility: Rolling Rolling: Max assist         General bed mobility comments: Pt attempted to perform rolling to L; pt able to initiate however unable to come into full sidelying position secondary to pain     Transfers                 General transfer comment: pt deferred secondary to increased pain  Ambulation/Gait             General Gait Details: not performed due to pt deferral to OOB activities  Stairs            Wheelchair Mobility    Modified Rankin (Stroke Patients Only)       Balance Overall balance assessment: Needs assistance     Sitting balance - Comments: not performed however anticipate assistance secondary to abdominal pain       Standing balance comment: not performed however anticipate assistance secondary to abdominal pain                             Pertinent Vitals/Pain Pain Assessment: 0-10 Pain Score: 7  Pain Location: generalized abdomen near incisional/open areas Pain Descriptors / Indicators: Burning;Sharp;Aching;Discomfort Pain Intervention(s): Monitored during session;Limited activity within patient's tolerance;Other (comment) (pt reports that he is not able to get pain meds for 15 mins)    Home Living Family/patient expects to be discharged to:: Private residence Living Arrangements: Spouse/significant other;Children Available Help at Discharge: Family;Available PRN/intermittently Type of  Home: House Home Access: Stairs to enter Entrance Stairs-Rails: Left Entrance Stairs-Number of Steps: 7 STE L railing Home Layout: One level Home Equipment: Cane - single point;Walker - 2 wheels Additional Comments: game ready ice machine    Prior Function Level of Independence: Independent with  assistive device(s)         Comments: Pt voiced utilizing SPC/RW occasionally due to right knee pain.      Hand Dominance   Dominant Hand: Right    Extremity/Trunk Assessment   Upper Extremity Assessment Upper Extremity Assessment: Generalized weakness;Defer to OT evaluation    Lower Extremity Assessment Lower Extremity Assessment: Generalized weakness;RLE deficits/detail (BLE grossly 3-/5) RLE Deficits / Details: slightly decreased active knee ROM secondary to prior TKA RLE Sensation: decreased light touch RLE Coordination: decreased fine motor    Cervical / Trunk Assessment Cervical / Trunk Assessment: Normal  Communication   Communication: No difficulties  Cognition Arousal/Alertness: Awake/alert Behavior During Therapy: WFL for tasks assessed/performed Overall Cognitive Status: Within Functional Limits for tasks assessed                                 General Comments: Pain limited, and brief with answers to therapist questions, but overall WFL.      General Comments General comments (skin integrity, edema, etc.): noted colostomy to LLQ with JP drain to RLQ with multiple honeycomb bandages to smaller incisions across abdomen    Exercises Other Exercises Other Exercises:  Other Exercises: pt performed AP and QS x 10, scapular retractions x 12, hip ab/add x 5 (limited d/t abd pain), and resistive toe presses x 10   Assessment/Plan    PT Assessment Patient needs continued PT services  PT Problem List Decreased strength;Decreased range of motion;Decreased activity tolerance;Decreased balance;Decreased mobility;Decreased coordination;Obesity;Decreased skin integrity;Pain       PT Treatment Interventions DME instruction;Gait training;Stair training;Functional mobility training;Therapeutic activities;Therapeutic exercise;Balance training;Patient/family education    PT Goals (Current goals can be found in the Care Plan section)  Acute Rehab PT  Goals Patient Stated Goal: to go home PT Goal Formulation: With patient Time For Goal Achievement: 10/31/20 Potential to Achieve Goals: Good    Frequency Min 2X/week   Barriers to discharge        Co-evaluation               AM-PAC PT "6 Clicks" Mobility  Outcome Measure Help needed turning from your back to your side while in a flat bed without using bedrails?: A Lot Help needed moving from lying on your back to sitting on the side of a flat bed without using bedrails?: A Lot Help needed moving to and from a bed to a chair (including a wheelchair)?: A Lot Help needed standing up from a chair using your arms (e.g., wheelchair or bedside chair)?: A Lot Help needed to walk in hospital room?: A Lot Help needed climbing 3-5 steps with a railing? : Total 6 Click Score: 11    End of Session   Activity Tolerance: Patient limited by pain Patient left: in bed;with call bell/phone within reach;with bed alarm set;Other (comment) (pt declined SCD placement) Nurse Communication: Mobility status PT Visit Diagnosis: Unsteadiness on feet (R26.81);Muscle weakness (generalized) (M62.81);Pain;Other abnormalities of gait and mobility (R26.89) Pain - Right/Left:  (central) Pain - part of body:  (abdomen)    Time: 3235-5732 PT Time Calculation (min) (ACUTE ONLY): 14 min   Charges:  Frederich Chick, SPT  Kylani Wires 10/17/2020, 4:09 PM

## 2020-10-17 NOTE — Progress Notes (Addendum)
Nutrition Follow Up Note   DOCUMENTATION CODES:   Morbid obesity  INTERVENTION:   TPN per pharmacy   Vitamin C 270m po BID   NUTRITION DIAGNOSIS:   Inadequate oral intake related to acute illness as evidenced by NPO status. -pt advanced to clear liquids   GOAL:   Patient will meet greater than or equal to 90% of their needs  -met with TPN   MONITOR:   PO intake, Diet advancement, Labs, Weight trends, Skin, I & O's, Other (Comment) (TPN)  ASSESSMENT:   39y/o male with h/o anxiety, depression, chronic pain syndrome, HTN, OSA, morbid obesity, kidney stones, asthma and diverticulitis who is s/p robotic assisted sigmoid colectomy for complicated diverticulitis 10/6 with colovesical fistula, complicated by COVID 19 infection and anastomotic leak   Pt s/p incisional I & D 10/12 Pt s/p IR drain placement 10/15 Pt s/p laparoscopic colostomy and LOA 10/20  Met with pt in room today. Pt reports that he is feeling rough today; his main complaint is that he is weak. Pt initiated on a clear liquid diet 10/21; pt reports that he is eating mainly jello and popcicles as he reports that he does not like the broth. Pt asking questions about supplements today. Pt reports that he is lactose intolerant and that he is reluctant to drink any supplements as he is fearful that they will cause GI upset. RD discussed the different supplement choices with pt; all [facility]. Pt reports that he is willing to try the supplements once his diet advances. RD discussed with pt why the supplements are important and explained how they would help to preserve lean muscle. Pt is currently meeting 100% of his needs via TPN. Refeed labs stable. Drains with 867moutput. Pt is planning to work with PT. RD will monitor pt's oral intake; will plan to incorporate supplements back into pt's diet in a few days.  Per chart, pt is down ~6lbs since admit but is still ~22lbs  above his UBW. Pt is noted to have edema. Pt is receiving lasix. UOP is 260066m Medications reviewed and include: vitamin C, lasix, heparin, protonix, zosyn, vancomycin   Labs reviewed: K 4.7 wnl P 2.5 wnl, Mg 2.2 wnl- 10/21 Pre-albumin 8.5(L)- 10/18 Triglycerides 137- 10/18  Diet Order:   Diet Order            Diet clear liquid Room service appropriate? Yes; Fluid consistency: Thin  Diet effective now                EDUCATION NEEDS:   No education needs have been identified at this time  Skin:  Skin Assessment: Reviewed RN Assessment (incision abdomen)  Last BM:  10/21- 86m70ma ostomy  Height:   Ht Readings from Last 1 Encounters:  10/01/20 '5\' 6"'  (1.676 m)    Weight:   Wt Readings from Last 1 Encounters:  10/15/20 (!) 140.3 kg    Ideal Body Weight:  64.5 kg  BMI:  Body mass index is 49.92 kg/m.  Estimated Nutritional Needs:   Kcal:  2900-3200kcal/day  Protein:  130-145g/day  Fluid:  2-2.3L/day  CaseKoleen Distance RD, LDN Please refer to [facility] RD and/or RD on-call/weekend/after hours pager

## 2020-10-17 NOTE — Progress Notes (Addendum)
[facility] SURGICAL PROGRESS NOTE  Hospital Day(s): 16.   Post op day(s): 2 Days Post-Op.   Interval History:  Patient seen and examined no acute events or new complaints overnight.  Patient reports he feels very weak and tired right now Still with abdominal soreness, pain medications helping some Complaints of peripheral edema Labs are pending this morning, phlebotomy at bedside Good UO in last 24 at 2.6L; maintained foley yesterday Surgical drain with 80 ccs out in last 24 hours, serosanguinous Started on CLD yesterday, tolerating well Remains on goal rate TPN Colostomy with 60 ccs out, bowel sweat, some gas Has not been mobilizing  Vital signs in last 24 hours: [min-max] current  Temp:  [97.8 F (36.6 C)-99.7 F (37.6 C)] 98.5 F (36.9 C) (10/22 0531) Pulse Rate:  [97-122] 112 (10/22 0531) Resp:  [18-20] 20 (10/22 0531) BP: (143-158)/(90-101) 144/95 (10/22 0531) SpO2:  [98 %-100 %] 98 % (10/22 0531)     Height: 5\' 6"  (167.6 cm) Weight: (!) 140.3 kg BMI (Calculated): 49.95   Intake/Output last 2 shifts:  10/21 0701 - 10/22 0700 In: 2426.8 [P.O.:720; I.V.:962.6; IV Piggyback:744.2] Out: 2740 [Urine:2600; Drains:80; Stool:60]   Physical Exam:  Constitutional: alert, cooperative and no distress  Respiratory: breathing non-labored at rest  Cardiovascular: regular rate and sinus rhythm  Gastrointestinal: Soft, diffuse soreness, no appreciable distension, no rebound/guarding. Colostomy in left mid-abdomen which is flush to the skin, bowel sweat in bag, he is passing gas while I was in room, JP in RLQ with serosanguinous output Genitourinary: Foley in place Integumentary: Laparoscopic incisions now with skin staples and honeycomb, no appreciable erythema Musculoskeletal: 2+ pitting edema  Labs:  CBC Latest Ref Rng & Units 10/16/2020 10/15/2020 10/14/2020  WBC 4.0 - 10.5 K/uL 14.5(H) 9.6 9.8  Hemoglobin 13.0 - 17.0 g/dL 11.0(L) 10.6(L) 11.2(L)  Hematocrit  39 - 52 % 35.0(L) 34.1(L) 35.1(L)  Platelets 150 - 400 K/uL 450(H) 463(H) 523(H)   CMP Latest Ref Rng & Units 10/16/2020 10/15/2020 10/13/2020  Glucose 70 - 99 mg/dL 10/15/2020) 191(Y) 782(N)  BUN 6 - 20 mg/dL 10 12 11   Creatinine 0.61 - 1.24 mg/dL 562(Z 3.08  Sodium 135 - 145 mmol/L 143 135 139  Potassium 3.5 - 5.1 mmol/L 4.9 4.3 4.2  Chloride 98 - 111 mmol/L 107 102 102  CO2 22 - 32 mmol/L 27 25 29   Calcium 8.9 - 10.3 mg/dL 8.1(L) 8.2(L) 8.3(L)  Total Protein 6.5 - 8.1 g/dL 5.9(L) - 6.4(L)  Total Bilirubin 0.3 - 1.2 mg/dL 0.4 - 0.6  Alkaline Phos 38 - 126 U/L 78 - 91  AST 15 - 41 U/L 24 - 22  ALT 0 - 44 U/L 23 - 19     Imaging studies: No new pertinent imaging studies   Assessment/Plan:  39 y.o. male 2 Days Post-Op s/p robotic assisted laparoscopic Hartman's Procedure for anastomotic dehiscence which failed attempt at conservative management following initial robotic assisted sigmoid colectomy on 10/06.   - Okay to trial CLD this morning; awaiting further return of bowel function before advancing              - Continue TPN at goal rate for now;              - Discontinue foley today             - Continue IV ABx (Zosyn); follow up Cx obtained during I&D ad drain placement --> growing E coli (sensitive to Zosyn); and and enterococcus (resistant  to ampicillin) --> will add vancomycin; Day 4/10              - Monitor abdominal examination closely   - Pain control prn; I added lidocaine patch this morning   - Antiemetics prn             - Monitor leukocytosis; fever curve   - Continue PO lasix 20 mg for edema; he understands he also needs to ambulate to mobilize fluid             - Continue current surgical drain; monitor and record output             - Pain control prn; antiemetics prn             - Mobilization encouraged; will engage PT today             - Home medications     All of the above findings and recommendations were discussed with the patient, and the medical team,  and all of patient's questions were answered to his expressed satisfaction.  -- Lynden Oxford, PA-C [facility] 10/17/2020, 7:16 AM [phone] M-F: 7am - 4pm  I saw and evaluated the patient.  I agree with the above documentation, exam, and plan, which I have edited where appropriate. Duanne Guess  4:43 PM

## 2020-10-17 NOTE — Evaluation (Signed)
Occupational Therapy Evaluation Patient Details Name: Jordan [name] MRN: [id number] DOB: [birth year] Today's Date: 10/17/2020    History of Present Illness Jordan [name] is a 39 y.o. male 16 days Post-Op s/p robotic assisted sigmoid colectomy for complicated diverticulitis with colovesical fistula, complicated by pertinent comorbidities including COVID+ and post-op wound infection. Pt s/p I&D of surgical site, currently on TPN  for anticipated prolonged NPO status. Recently began trial of clear liquids.   Clinical Impression   Mr. Jordan [name]" Rohl was seen for OT evaluation this date. Prior to hospital admission, pt was active and generally independent for ADL/IADL management. He denies falls history in past 6 months, and states he uses either a SPC or RW occasionally for mobility 2/2 R knee pain. Pt lives with his spouse and two sons (43 y.o. & 80 y.o.). 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 Currently pt demonstrates impairments as described below (See OT problem list) which functionally limit his ability to perform ADL/self-care tasks. Pt currently requires MOD-MAX A for exertional ADL management including LB dressing and bathing in seated position. Anticipate +2 for functional mobility, however, pt declines any OOB/EOB activity at time of evaluation 2/2 increased abdominal pain and fatigue. Pt would benefit from skilled OT services to address noted impairments and functional limitations (see below for any additional details) in order to maximize safety and independence while minimizing falls risk and caregiver burden. Upon hospital discharge, recommend STR to maximize pt safety and return to PLOF.      Follow Up Recommendations  SNF;Supervision/Assistance - 24 hour    Equipment Recommendations  3 in 1 bedside commode [facility])    Recommendations  for Other Services       Precautions / Restrictions Precautions Precautions: Fall      Mobility Bed Mobility               General bed mobility comments: Deferred. Pt declines all OOB/EOB activity this date 2/2 abdominal pain.    Transfers                 General transfer comment: Deferred. Pt declines all OOB/EOB activity this date 2/2 abdominal pain.    Balance Overall balance assessment: Needs assistance                                         ADL either performed or assessed with clinical judgement   ADL Overall ADL's : Needs assistance/impaired                                       General ADL Comments: Pt functionally limited by increased pain, decreased activity tolerance, and generalized weakness. He declines any OOB/EOB functional tasks during session. Anticipate MOD-MAX A for LB ADL mgt. +2 for functional/bed mobility.     Vision Baseline Vision/History: Wears glasses Wears Glasses: Reading only Patient Visual Report: No change from baseline       Perception     Praxis      Pertinent Vitals/Pain Pain Assessment: 0-10 Pain Score: 6  Pain Location: 6/10 LUQ pain from incision/sx Pain Descriptors / Indicators: Sharp;Constant;Discomfort Pain Intervention(s): Limited activity within patient's tolerance;Monitored during session;Patient requesting pain meds-RN notified  Hand Dominance Right   Extremity/Trunk Assessment Upper Extremity Assessment Upper Extremity Assessment: Generalized weakness (BUE grossly 3 to 3+/5 however pt pain limited unable to participate in formal MMT. Grip strength WNL.)   Lower Extremity Assessment Lower Extremity Assessment: Generalized weakness;Defer to PT evaluation   Cervical / Trunk Assessment Cervical / Trunk Assessment: Normal   Communication Communication Communication: No difficulties   Cognition Arousal/Alertness: Awake/alert Behavior During Therapy: WFL for tasks  assessed/performed Overall Cognitive Status: Within Functional Limits for tasks assessed                                 General Comments: Pain limited, and brief with answers to therapist questions, but overall WFL.   General Comments       Exercises Other Exercises Other Exercises: Pt educated on role of OT in acute setting, falls prevention strategies, compression stocking management strategies, and importance of mobility during hospital stay.   Shoulder Instructions      Home Living Family/patient expects to be discharged to:: Private residence Living Arrangements: Spouse/significant other;Children Available Help at Discharge: Available PRN/intermittently Type of Home: House Home Access: Stairs to enter [employer] of Steps: 7 STE L railing Entrance Stairs-Rails: Left Home Layout: One level     Bathroom Shower/Tub: Tub/shower unit;Walk-in shower   Bathroom Toilet: Handicapped height Bathroom Accessibility: Yes   Home Equipment: Cane - single point;Walker - 2 wheels;Other (comment)   Additional Comments: game ready ice machine      Prior Functioning/Environment Level of Independence: Independent with assistive device(s)        Comments: Pt states he was independent with all ADL management PTA, he states he was active caring for the family's farm animals, occasionally using a RW or SPC for functional mobility 2/2 R knee pain.        OT Problem List: Decreased strength;Pain;Decreased activity tolerance;Decreased safety awareness;Impaired balance (sitting and/or standing);Decreased knowledge of use of DME or AE;Increased edema;Obesity      OT Treatment/Interventions: Self-care/ADL training;Therapeutic exercise;Therapeutic activities;DME and/or AE instruction;Patient/family education;Balance training;Energy conservation    OT Goals(Current goals can be found in the care plan section) Acute Rehab OT Goals Patient Stated Goal: to go home OT  Goal Formulation: With patient Time For Goal Achievement: 10/31/20 Potential to Achieve Goals: Good ADL Goals Pt Will Perform Upper Body Dressing: with supervision;with set-up;sitting Pt Will Perform Lower Body Dressing: with supervision;with set-up;sit to/from stand;with adaptive equipment (c LRAD PRN for improved safety and functional indep upon hospital DC.) Pt Will Transfer to Toilet: bedside commode;with set-up;with supervision (c LRAD PRN for improved safety and functional indep upon hospital DC.) Pt Will Perform Toileting - Clothing Manipulation and hygiene: with adaptive equipment;with supervision;with set-up;sit to/from stand (c LRAD PRN for improved safety and functional indep upon hospital DC.)  OT Frequency: Min 1X/week   Barriers to D/C: Decreased caregiver support;Inaccessible home environment          Co-evaluation              AM-PAC OT "6 Clicks" Daily Activity     Outcome Measure Help from another person eating meals?: A Little Help from another person taking care of personal grooming?: A Little Help from another person toileting, which includes using toliet, bedpan, or urinal?: A Lot Help from another person bathing (including washing, rinsing, drying)?: A Lot Help from another person to put on and taking off regular upper body clothing?: A Lot Help from another  person to put on and taking off regular lower body clothing?: A Lot 6 Click Score: 14   End of Session Nurse Communication: Patient requests pain meds;Other (comment) (Pt requesting/would benefit from compression stockings for BLE edema.)  Activity Tolerance: Patient limited by fatigue;Patient limited by pain Patient left: in bed;with call bell/phone within reach;with bed alarm set;Other (comment) (With PT in room to complete evaluation.)  OT Visit Diagnosis: Other abnormalities of gait and mobility (R26.89);Pain Pain - Right/Left:  (Both) Pain - part of body:  (Abdominal)                Time:  1829-9371 OT Time Calculation (min): 19 min Charges:  OT General Charges $OT Visit: 1 Visit OT Evaluation $OT Eval Moderate Complexity: 1 Mod OT Treatments $Self Care/Home Management : 8-22 mins  Rockney Ghee, M.S., OTR/L Ascom: [phone] 10/17/20, 3:44 PM

## 2020-10-17 NOTE — Consult Note (Signed)
Pharmacy Antibiotic Note  Jordan [name] is a 39 y.o. male admitted on 10/01/2020 with diverticulitis now 16 Days Post-Op s/p robotic assisted sigmoid colectomy, 2 Days Post-Op s/p robotic assisted laparoscopic Hartman's Procedure  .  Pharmacy has been consulted for vancomycin and Zosyn dosing. His wound culture has grown out ampicillin-resistant Enterococcus.  Today, 10/17/2020  Day 4 vancomycin, Day 11 pip/tazo  Patient back to OR 10/20 d/t persistent leak at anastomosis s/p colostomy  SCr stable  WBC elevated but down from yesterday  Afebrile  10/22 13:30 vancomycin trough = ____  Mcg/mL on 1gm IV q8h   Plan: (until vancomycin level results)  1) Continue vancomycin 1000 mg IV Q 8 hrs - check vancomycin trough prior to 1400 dose today.  Goal trough 15-20 mcg/ml, prefer to keep closer to 15 mcg/ml  2) continue Zosyn 3.375g IV q8h (4 hour infusion).  3) consider ID consult to help guide antibiotic therapy in this complicated case  Height: 5\' 6"  (167.6 cm) Weight: (!) 140.3 kg (309 lb 4.9 oz) IBW/kg (Calculated) : 63.8  Temp (24hrs), Avg:98.8 F (37.1 C), Min:97.8 F (36.6 C), Max:99.7 F (37.6 C)  Recent Labs  Lab 10/11/20 0449 10/11/20 0450 10/12/20 0507 10/13/20 0539 10/14/20 0451 10/15/20 0640 10/16/20 0607  WBC 13.3*  --   --  12.7* 9.8 9.6 14.5*  CREATININE  --  0.68 0.73 0.77  --  0.72 0.80    Estimated Creatinine Clearance: 163.9 mL/min (by C-G formula based on SCr of 0.8 mg/dL).    Allergies  Allergen Reactions  . Butrans [Buprenorphine Hcl]     Difficulty breathing, n/v  . Nucynta [Tapentadol Hydrochloride] Anaphylaxis  . Tapentadol Hcl Anaphylaxis  . Glucose Polymer Other (See Comments)    poison  . Sumac     poison  . Bactrim [Sulfamethoxazole-Trimethoprim] Itching and Rash    Antimicrobials this admission: 10/12 Zosyn >>  10/19 vancomycin >>   Microbiology results: 10/09 SARS CoV-2: negative  10/09 influenza A/B:  negative 10/15 WCx: E coli, E raffinosus, beta lactamase (+) 10/12 WCx: E coli  Thank you for allowing pharmacy to be a part of this patient's care.  12/12, PharmD, BCPS.   10/17/2020 7:19 AM

## 2020-10-17 NOTE — Consult Note (Signed)
Pharmacy Antibiotic Note  Jordan [name] is a 39 y.o. male admitted on 10/01/2020 with diverticulitis now 16 Days Post-Op s/p robotic assisted sigmoid colectomy, 2 Days Post-Op s/p robotic assisted laparoscopic Hartman's Procedure  .  Pharmacy has been consulted for vancomycin and Zosyn dosing. His wound culture has grown out ampicillin-resistant Enterococcus.  Today, 10/17/2020  Day 4 vancomycin, Day 11 pip/tazo  Patient back to OR 10/20 d/t persistent leak at anastomosis s/p colostomy  SCr stable  WBC elevated but down from yesterday  Afebrile  10/22 13:30 vancomycin trough = 10  Mcg/mL on 1gm IV q8h   Plan:   1) Increase Vancomycin 1250 mg IV Q 8 hrs.  Goal trough 15-20 mcg/ml, prefer to keep closer to 15 mcg/ml. Will recheck Vancomcyin trough.  2) continue Zosyn 3.375g IV q8h (4 hour infusion).  3) consider ID consult to help guide antibiotic therapy in this complicated case  Height: 5\' 6"  (167.6 cm) Weight: (!) 140.3 kg (309 lb 4.9 oz) IBW/kg (Calculated) : 63.8  Temp (24hrs), Avg:98.7 F (37.1 C), Min:97.8 F (36.6 C), Max:99.7 F (37.6 C)  Recent Labs  Lab 10/11/20 0449 10/12/20 0507 10/13/20 0539 10/14/20 0451 10/15/20 0640 10/16/20 0607 10/17/20 0919 10/17/20 1334  WBC   < >  --  12.7* 9.8 9.6 14.5* 13.5*  --   CREATININE  --  0.73 0.77  --  0.72 0.80 0.80  --   VANCOTROUGH  --   --   --   --   --   --   --  10*   < > = values in this interval not displayed.    Estimated Creatinine Clearance: 163.9 mL/min (by C-G formula based on SCr of 0.8 mg/dL).    Allergies  Allergen Reactions   Butrans [Buprenorphine Hcl]     Difficulty breathing, n/v   Nucynta [Tapentadol Hydrochloride] Anaphylaxis   Tapentadol Hcl Anaphylaxis   Glucose Polymer Other (See Comments)    poison   Lactose Intolerance (Gi)    Sumac     poison   Bactrim [Sulfamethoxazole-Trimethoprim] Itching and Rash    Antimicrobials this admission: 10/12 Zosyn >>   10/19 vancomycin >>   Microbiology results: 10/09 SARS CoV-2: negative  10/09 influenza A/B: negative 10/15 WCx: E coli, E raffinosus, beta lactamase (+) 10/12 WCx: E coli  Thank you for allowing pharmacy to be a part of this patients care.  12/12, PharmD, BCPS 10/17/2020 8:58 PM

## 2020-10-17 NOTE — Anesthesia Postprocedure Evaluation (Signed)
Anesthesia Post Note  Patient: Randel Pigg Vialpando III  Procedure(s) Performed: XI ROBOT ASSISTED DIAGNOSTIC LAPAROSCOPY, HARTMAN'S (N/A Abdomen) LYSIS OF ADHESION (Abdomen)  Patient location during evaluation: PACU Anesthesia Type: General Level of consciousness: awake and alert Pain management: pain level controlled Vital Signs Assessment: post-procedure vital signs reviewed and stable Respiratory status: spontaneous breathing, nonlabored ventilation and respiratory function stable Cardiovascular status: blood pressure returned to baseline and stable Postop Assessment: no apparent nausea or vomiting Anesthetic complications: no   No complications documented.   Last Vitals:  Vitals:   10/17/20 0531 10/17/20 0902  BP: (!) 144/95 138/87  Pulse: (!) 112 (!) 104  Resp: 20 18  Temp: 36.9 C 37.4 C  SpO2: 98% 100%    Last Pain:  Vitals:   10/17/20 0902  TempSrc: Oral  PainSc:                  Karleen Hampshire

## 2020-10-17 NOTE — Consult Note (Signed)
PHARMACY - TOTAL PARENTERAL NUTRITION CONSULT NOTE   Indication: Prolonged NPO  Patient Measurements: Height: 5\' 6"  (167.6 cm) Weight: (!) 140.3 kg (309 lb 4.9 oz) IBW/kg (Calculated) : 63.8 TPN AdjBW (KG): 80.3 Body mass index is 49.92 kg/m.  Assessment:  Patient is a 39 y/o M with medical history including morbid obesity, hypertension, diverticulitis who is s/p sigmoid colectomy for colovesical fistula and subsequent Hartman's Procedurefor anastomotic dehiscence. Post-operative course complicated by  surgical wound infection. Pharmacy has been consulted to initiate TPN for anticipated prolonged NPO status.   Glucose / Insulin: No history of diabetes. Normoglycemic Electrolytes: WNL Renal: Scr < 1 (stable and c/w baseline) LFTs / TGs: LFTs within normal limits. TG 156  Prealbumin / albumin: Albumin 2.6 on 10/15 Intake / Output; MIVF:  10/19 0701 - 10/20 0700 In: 2424.3 [I.V.:1692.3; IV Piggyback:727] Out: 2240 [Urine:2200; Drains:40]  GI Imaging: 10/12 CT Abdomen/Pelvis: 1. Postoperative findings of sigmoid colon resection and reanastomosis. There is a frank defect at the right aspect of the anastomosis with air and fluid tracking superiorly into the central small bowel mesentery. Although very elongated and irregular in shape, the largest, most central component of air and fluid in the central small bowel mesentery measures at least 13.3 x 4.8 cm. 2. The small bowel is diffusely fluid-filled without a distinctly identified transition point and there is gas present throughout the colon to the rectum. There are thickened loops of small bowel adjacent to air and fluid within the mesentery, likely reactive to adjacent infection and overall findings most consistent with ileus. 3. Trace free fluid in the low abdomen and pelvis in addition to minimal pneumoperitoneum. There is subcutaneous emphysema about the anterior left hemiabdomen and a small volume of emphysema about the lower  esophagus. Findings are generally in keeping with recent laparoscopy. 10/15 CT Abdomen/Pelvis: 1. Signs of persistent anastomotic dehiscence of status post sigmoid resection. 2. Dilated loops of small bowel noted which are favored to represent ileus. 10/20 CT Abdomen/Pelvis: 1.Worsening small bowel ileus and signs of enteritis likely related to peritoneal and mesenteric inflammation. 2. Interval placement of a LEFT lower quadrant drain with decompression of some of the areas of fluid and gas in the pelvis but with persistent large volume pneumoperitoneum and layering of fluid within this pneumoperitoneum in the mid abdomen. Findings are compatible with diffuse dissemination of leaked bowel contents into the upper abdomen.  Surgeries / Procedures:  10/6 Robotic assisted sigmoid colectomy 10/12 I&D of post-operative wound infection 10/20 laparoscopic Hartman's Procedure   Central access: 10/09/20  TPN start date: 10/09/20   Nutritional Goals (per RD recommendation on 10/13): kCal: 2900-3200, Protein: 130-145g, Fluid: 2 - 2.3L Goal TPN rate is 90 mL/hr (provides 140 g of protein and 3000 kcals per day)  Current Nutrition:  CLD  Plan:   Continue TPN at 90 mL/hr   Nutritional Components:  Clinisol-amino acid 15%: 65g/L, 140.4g  Dextrose 21%, 453.6 g  Lipids 20%: 42 g/L, 90.8g  Calories: 3011.8 kCal  Electrolytes in TPN: standard, Cl:Ac ratio 1:1  Add standard MVI and trace elements to TPN  Continue MIVF at 10 mL/hr   Monitor TPN labs on Mon/Thurs since electrolytes have been stable  11/13 10/17/2020,7:19 AM

## 2020-10-18 LAB — CBC
HCT: 30.3 % — ABNORMAL LOW (ref 39.0–52.0)
Hemoglobin: 9.5 g/dL — ABNORMAL LOW (ref 13.0–17.0)
MCH: 26 pg (ref 26.0–34.0)
MCHC: 31.4 g/dL (ref 30.0–36.0)
MCV: 83 fL (ref 80.0–100.0)
Platelets: 413 10*3/uL — ABNORMAL HIGH (ref 150–400)
RBC: 3.65 MIL/uL — ABNORMAL LOW (ref 4.22–5.81)
RDW: 15 % (ref 11.5–15.5)
WBC: 14.7 10*3/uL — ABNORMAL HIGH (ref 4.0–10.5)
nRBC: 0 % (ref 0.0–0.2)

## 2020-10-18 LAB — BASIC METABOLIC PANEL
Anion gap: 9 (ref 5–15)
BUN: 9 mg/dL (ref 6–20)
CO2: 27 mmol/L (ref 22–32)
Calcium: 7.7 mg/dL — ABNORMAL LOW (ref 8.9–10.3)
Chloride: 101 mmol/L (ref 98–111)
Creatinine, Ser: 0.7 mg/dL (ref 0.61–1.24)
GFR, Estimated: >60 mL/min (ref >60)
Glucose, Bld: 147 mg/dL — ABNORMAL HIGH (ref 70–99)
Potassium: 4.2 mmol/L (ref 3.5–5.1)
Sodium: 137 mmol/L (ref 135–145)

## 2020-10-18 MED ORDER — TRACE MINERALS CU-MN-SE-ZN 300-55-60-3000 MCG/ML IV SOLN
INTRAVENOUS | Status: AC
  Filled 2020-10-18: qty 936

## 2020-10-18 NOTE — Progress Notes (Signed)
[facility] SURGICAL PROGRESS NOTE  Hospital Day(s): 17.   Post op day(s): 3 Days Post-Op.   Interval History:  Patient seen and examined He reports that his pain control is about the same, slightly better than it has been He is complaining about the smell from his ostomy Complaints of peripheral edema Diuresed for over 7 L yesterday. Surgical drain with 100 ccs out in last 24 hours, serosanguinous Started on CLD yesterday, tolerating well Remains on goal rate TPN Colostomy with 60 ccs out, bowel sweat, some gas Has not been mobilizing, secondary to pain Labs show stable leukocytosis without significant electrolyte abnormalities.  Vital signs in last 24 hours: [min-max] current  Temp:  [97.8 F (36.6 C)-99 F (37.2 C)] 98.3 F (36.8 C) (10/23 0843) Pulse Rate:  [101-110] 101 (10/23 0843) Resp:  [15-20] 20 (10/23 0843) BP: (134-171)/(79-106) 134/95 (10/23 0843) SpO2:  [100 %] 100 % (10/23 0843) Weight:  [109.9 kg] 109.9 kg (10/23 0403)     Height: 5\' 6"  (167.6 cm) Weight: 109.9 kg BMI (Calculated): 39.12   Intake/Output last 2 shifts:  10/22 0701 - 10/23 0700 In: 2741.9 [P.O.:1080; I.V.:1196.5; IV Piggyback:465.4] Out: 7825 [Urine:7700; Drains:100; Stool:25]   Physical Exam:  Constitutional: alert, cooperative and no distress  Respiratory: breathing non-labored at rest  Cardiovascular: regular rate and sinus rhythm  Gastrointestinal: Soft, diffuse soreness, no appreciable distension, no rebound/guarding. Colostomy in left mid-abdomen which is flush to the skin, small amount of loose brown stool in bag, JP in RLQ with serosanguinous output Genitourinary: Foley removed Integumentary: Laparoscopic incisions now with skin staples and honeycomb, no appreciable erythema Musculoskeletal: 2+ pitting edema  Labs:  CBC Latest Ref Rng & Units 10/18/2020 10/17/2020 10/16/2020  WBC 4.0 - 10.5 K/uL 14.7(H) 13.5(H) 14.5(H)  Hemoglobin 13.0 - 17.0 g/dL 10/18/2020) 11.6(L)  11.0(L)  Hematocrit 39 - 52 % 30.3(L) 39.7 35.0(L)  Platelets 150 - 400 K/uL 413(H) 198 450(H)   CMP Latest Ref Rng & Units 10/18/2020 10/17/2020 10/16/2020  Glucose 70 - 99 mg/dL 10/18/2020) 268(T) 419(Q)  BUN 6 - 20 mg/dL 9 10 10   Creatinine 0.61 - 1.24 mg/dL 222(L 7.98  Sodium 135 - 145 mmol/L 137 140 143  Potassium 3.5 - 5.1 mmol/L 4.2 4.7 4.9  Chloride 98 - 111 mmol/L 101 106 107  CO2 22 - 32 mmol/L 27 23 27   Calcium 8.9 - 10.3 mg/dL 7.7(L) 8.0(L) 8.1(L)  Total Protein 6.5 - 8.1 g/dL - - 5.9(L)  Total Bilirubin 0.3 - 1.2 mg/dL - - 0.4  Alkaline Phos 38 - 126 U/L - - 78  AST 15 - 41 U/L - - 24  ALT 0 - 44 U/L - - 23     Imaging studies: No new pertinent imaging studies   Assessment/Plan:  39 y.o. male 3 Days Post-Op s/p robotic assisted laparoscopic Hartman's Procedure for anastomotic dehiscence which failed attempt at conservative management following initial robotic assisted sigmoid colectomy on 10/06.   -Continue clear liquid diet for now.  Would like to see more robust ostomy function prior to advancing              - Continue TPN at goal rate for now;              -Ostomy nurse can work with him on odor minimizing strategies             - Continue IV ABx (Zosyn); follow up Cx obtained during I&D ad drain placement --> growing E coli (  sensitive to Zosyn); and and enterococcus (resistant to ampicillin) --> will add vancomycin; Day 4/10              - Monitor abdominal examination closely   - Pain control prn; he did not think the lidocaine patch was helpful.  - Antiemetics prn             - Monitor leukocytosis; fever curve   - Continue PO lasix 20 mg for edema; he understands he also needs to ambulate to mobilize fluid             - Continue current surgical drain; monitor and record output             - Pain control prn; antiemetics prn             - Mobilization encouraged; he should receive his pain medication 30 minutes prior to physical therapy.             - Home  medications     All of the above findings and recommendations were discussed with the patient, and the medical team, and all of patient's questions were answered to his expressed satisfaction.

## 2020-10-18 NOTE — Consult Note (Signed)
PHARMACY - TOTAL PARENTERAL NUTRITION CONSULT NOTE   Indication: Prolonged NPO  Patient Measurements: Height: 5\' 6"  (167.6 cm) Weight: 109.9 kg (242 lb 4.6 oz) IBW/kg (Calculated) : 63.8 TPN AdjBW (KG): 80.3 Body mass index is 39.11 kg/m.  Assessment:  Patient is a 39 y/o M with medical history including morbid obesity, hypertension, diverticulitis who is s/p sigmoid colectomy for colovesical fistula and subsequent Hartman's Procedurefor anastomotic dehiscence. Post-operative course complicated by  surgical wound infection. Pharmacy has been consulted to initiate TPN for anticipated prolonged NPO status.   Glucose / Insulin: No history of diabetes. Normoglycemic Electrolytes: WNL Renal: Scr < 1 (stable and c/w baseline) LFTs / TGs: LFTs within normal limits. TG 156 > 137  Prealbumin / albumin: Albumin 2.6 on 10/15 Intake / Output; MIVF:  10/19 0701 - 10/20 0700 In: 2424.3 [I.V.:1692.3; IV Piggyback:727] Out: 2240 [Urine:2200; Drains:40]  GI Imaging: 10/12 CT Abdomen/Pelvis: 1. Postoperative findings of sigmoid colon resection and reanastomosis. There is a frank defect at the right aspect of the anastomosis with air and fluid tracking superiorly into the central small bowel mesentery. Although very elongated and irregular in shape, the largest, most central component of air and fluid in the central small bowel mesentery measures at least 13.3 x 4.8 cm. 2. The small bowel is diffusely fluid-filled without a distinctly identified transition point and there is gas present throughout the colon to the rectum. There are thickened loops of small bowel adjacent to air and fluid within the mesentery, likely reactive to adjacent infection and overall findings most consistent with ileus. 3. Trace free fluid in the low abdomen and pelvis in addition to minimal pneumoperitoneum. There is subcutaneous emphysema about the anterior left hemiabdomen and a small volume of emphysema about the lower  esophagus. Findings are generally in keeping with recent laparoscopy. 10/15 CT Abdomen/Pelvis: 1. Signs of persistent anastomotic dehiscence of status post sigmoid resection. 2. Dilated loops of small bowel noted which are favored to represent ileus. 10/20 CT Abdomen/Pelvis: 1.Worsening small bowel ileus and signs of enteritis likely related to peritoneal and mesenteric inflammation. 2. Interval placement of a LEFT lower quadrant drain with decompression of some of the areas of fluid and gas in the pelvis but with persistent large volume pneumoperitoneum and layering of fluid within this pneumoperitoneum in the mid abdomen. Findings are compatible with diffuse dissemination of leaked bowel contents into the upper abdomen.  Surgeries / Procedures:  10/6 Robotic assisted sigmoid colectomy 10/12 I&D of post-operative wound infection 10/20 laparoscopic Hartman's Procedure   Central access: 10/09/20  TPN start date: 10/09/20   Nutritional Goals (per RD recommendation on 10/13): kCal: 2900-3200, Protein: 130-145g, Fluid: 2 - 2.3L Goal TPN rate is 90 mL/hr (provides 140 g of protein and 3000 kcals per day)  Current Nutrition:  CLD  Plan:   Continue TPN at 90 mL/hr   Nutritional Components:  Clinisol-amino acid 15%: 65g/L, 140.4g  Dextrose 21%, 453.6 g  Lipids 20%: 42 g/L, 90.8g  Calories: 3011.8 kCal  Electrolytes in TPN: standard, Cl:Ac ratio 1:1  Add standard MVI and trace elements to TPN  Continue MIVF at 10 mL/hr   Monitor TPN labs on Mon/Thurs since electrolytes have been stable  11/13 10/18/2020,10:32 AM

## 2020-10-19 LAB — CBC
HCT: 29.6 % — ABNORMAL LOW (ref 39.0–52.0)
Hemoglobin: 9.3 g/dL — ABNORMAL LOW (ref 13.0–17.0)
MCH: 25.8 pg — ABNORMAL LOW (ref 26.0–34.0)
MCHC: 31.4 g/dL (ref 30.0–36.0)
MCV: 82.2 fL (ref 80.0–100.0)
Platelets: 383 10*3/uL (ref 150–400)
RBC: 3.6 MIL/uL — ABNORMAL LOW (ref 4.22–5.81)
RDW: 14.7 % (ref 11.5–15.5)
WBC: 15.2 10*3/uL — ABNORMAL HIGH (ref 4.0–10.5)
nRBC: 0 % (ref 0.0–0.2)

## 2020-10-19 LAB — BASIC METABOLIC PANEL
Anion gap: 8 (ref 5–15)
BUN: 10 mg/dL (ref 6–20)
CO2: 28 mmol/L (ref 22–32)
Calcium: 7.9 mg/dL — ABNORMAL LOW (ref 8.9–10.3)
Chloride: 100 mmol/L (ref 98–111)
Creatinine, Ser: 0.7 mg/dL (ref 0.61–1.24)
GFR, Estimated: >60 mL/min (ref >60)
Glucose, Bld: 116 mg/dL — ABNORMAL HIGH (ref 70–99)
Potassium: 4.1 mmol/L (ref 3.5–5.1)
Sodium: 136 mmol/L (ref 135–145)

## 2020-10-19 LAB — VANCOMYCIN, TROUGH: Vancomycin Tr: 16 ug/mL (ref 15–20)

## 2020-10-19 LAB — PHOSPHORUS: Phosphorus: 3.3 mg/dL (ref 2.5–4.6)

## 2020-10-19 LAB — MAGNESIUM: Magnesium: 2 mg/dL (ref 1.7–2.4)

## 2020-10-19 MED ORDER — TRACE MINERALS CU-MN-SE-ZN 300-55-60-3000 MCG/ML IV SOLN
INTRAVENOUS | Status: AC
  Filled 2020-10-19: qty 936

## 2020-10-19 NOTE — Progress Notes (Signed)
This AM Serous drainage observed on patients underwear on left side.  Noted ~39mm open area LLQ draining serous fluid.  Covered with tegaderm with 2x2. Dr. Lady Gary aware, no new orders- requested we monitor and change drsg as needed. 1500 drainage copious and on underwear again.  Also right abdominal JP drain site dressing saturated in serous fluid.  LLQ drsg removed and replaced with doubled ABD pad, secured with tape.  Right abdominal JP drain reinforced with doubled ABD pad.  1800 LLQ abd pad saturated as well as right JP drain site.  ABD pads changed. Dr. Lady Gary made aware- requests we change dressings as needed and assess JP drain tube, strip as there may be fibrin.

## 2020-10-19 NOTE — Consult Note (Signed)
Pharmacy Antibiotic Note  Jordan [name] is a 39 y.o. male admitted on 10/01/2020 with diverticulitis now 16 Days Post-Op s/p robotic assisted sigmoid colectomy, 2 Days Post-Op s/p robotic assisted laparoscopic Hartman's Procedure  .  Pharmacy has been consulted for vancomycin and Zosyn dosing. His wound culture has grown out ampicillin-resistant Enterococcus.  Today, 10/19/2020  Patient back to OR 10/20 d/t persistent leak at anastomosis s/p colostomy  SCr stable  WBC elevated   Afebrile  10/24 05:26 vancomycin trough = 16  Mcg/mL on 1.25gm IV q8h   Plan:   1) day 3 of vancomycin at therapeutic levels. Continue Vancomycin 1250 mg IV Q 8 hrs. Goal trough 15-20 mcg/ml, prefer to keep closer to 15 mcg/ml. Will recheck Vancomcyin trough in the next 4 days.   2) Day 13 of pip/tazo. continue Zosyn 3.375g IV q8h (4 hour infusion).  3) consider ID consult to help guide antibiotic therapy in this complicated case  Height: 5\' 6"  (167.6 cm) Weight: 111.8 kg (246 lb 7.6 oz) IBW/kg (Calculated) : 63.8  Temp (24hrs), Avg:98.2 F (36.8 C), Min:97.7 F (36.5 C), Max:98.6 F (37 C)  Recent Labs  Lab 10/15/20 0640 10/16/20 0607 10/17/20 0919 10/17/20 1334 10/18/20 0435 10/19/20 0520  WBC 9.6 14.5* 13.5*  --  14.7* 15.2*  CREATININE 0.72 0.80 0.80  --  0.70 0.70  VANCOTROUGH  --   --   --  10*  --  16    Estimated Creatinine Clearance: 144.1 mL/min (by C-G formula based on SCr of 0.7 mg/dL).    Allergies  Allergen Reactions  . Butrans [Buprenorphine Hcl]     Difficulty breathing, n/v  . Nucynta [Tapentadol Hydrochloride] Anaphylaxis  . Tapentadol Hcl Anaphylaxis  . Glucose Polymer Other (See Comments)    poison  . Lactose Intolerance (Gi)   . Sumac     poison  . Bactrim [Sulfamethoxazole-Trimethoprim] Itching and Rash    Antimicrobials this admission: 10/12 Zosyn >>  10/19 vancomycin >>   Microbiology results: 10/09 SARS CoV-2: negative  10/09 influenza  A/B: negative 10/15 WCx: E coli, E raffinosus, beta lactamase (+) 10/12 WCx: E coli  Thank you for allowing pharmacy to be a part of this patient's care.  12/12, PharmD, BCPS 10/19/2020 10:03 AM

## 2020-10-19 NOTE — Consult Note (Signed)
PHARMACY - TOTAL PARENTERAL NUTRITION CONSULT NOTE   Indication: Prolonged NPO  Patient Measurements: Height: 5\' 6"  (167.6 cm) Weight: 111.8 kg (246 lb 7.6 oz) IBW/kg (Calculated) : 63.8 TPN AdjBW (KG): 80.3 Body mass index is 39.78 kg/m.  Assessment:  Patient is a 39 y/o M with medical history including morbid obesity, hypertension, diverticulitis who is s/p sigmoid colectomy for colovesical fistula and subsequent Hartman's Procedurefor anastomotic dehiscence. Post-operative course complicated by  surgical wound infection. Pharmacy has been consulted to initiate TPN for anticipated prolonged NPO status.   Glucose / Insulin: No history of diabetes. Normoglycemic Electrolytes: WNL Renal: Scr < 1 (stable and c/w baseline) LFTs / TGs: LFTs within normal limits. TG 156 > 137  Prealbumin / albumin: Albumin 2.6 on 10/15 Intake / Output; MIVF:  10/19 0701 - 10/20 0700 In: 2424.3 [I.V.:1692.3; IV Piggyback:727] Out: 2240 [Urine:2200; Drains:40]  GI Imaging: 10/12 CT Abdomen/Pelvis: 1. Postoperative findings of sigmoid colon resection and reanastomosis. There is a frank defect at the right aspect of the anastomosis with air and fluid tracking superiorly into the central small bowel mesentery. Although very elongated and irregular in shape, the largest, most central component of air and fluid in the central small bowel mesentery measures at least 13.3 x 4.8 cm. 2. The small bowel is diffusely fluid-filled without a distinctly identified transition point and there is gas present throughout the colon to the rectum. There are thickened loops of small bowel adjacent to air and fluid within the mesentery, likely reactive to adjacent infection and overall findings most consistent with ileus. 3. Trace free fluid in the low abdomen and pelvis in addition to minimal pneumoperitoneum. There is subcutaneous emphysema about the anterior left hemiabdomen and a small volume of emphysema about the lower  esophagus. Findings are generally in keeping with recent laparoscopy. 10/15 CT Abdomen/Pelvis: 1. Signs of persistent anastomotic dehiscence of status post sigmoid resection. 2. Dilated loops of small bowel noted which are favored to represent ileus. 10/20 CT Abdomen/Pelvis: 1.Worsening small bowel ileus and signs of enteritis likely related to peritoneal and mesenteric inflammation. 2. Interval placement of a LEFT lower quadrant drain with decompression of some of the areas of fluid and gas in the pelvis but with persistent large volume pneumoperitoneum and layering of fluid within this pneumoperitoneum in the mid abdomen. Findings are compatible with diffuse dissemination of leaked bowel contents into the upper abdomen.  Surgeries / Procedures:  10/6 Robotic assisted sigmoid colectomy 10/12 I&D of post-operative wound infection 10/20 laparoscopic Hartman's Procedure   Central access: 10/09/20  TPN start date: 10/09/20   Nutritional Goals (per RD recommendation on 10/13): kCal: 2900-3200, Protein: 130-145g, Fluid: 2 - 2.3L Goal TPN rate is 90 mL/hr (provides 140 g of protein and 3000 kcals per day)  Current Nutrition:  CLD  Plan:   Continue TPN at 90 mL/hr   Nutritional Components:  Clinisol-amino acid 15%: 65g/L, 140.4g  Dextrose 21%, 453.6 g  Lipids 20%: 42 g/L, 90.8g  Calories: 3011.8 kCal  Electrolytes in TPN: standard, Cl:Ac ratio 1:1  Add standard MVI and trace elements to TPN  Continue MIVF at 10 mL/hr   Monitor TPN labs on Mon/Thurs since electrolytes have been stable  Jordan [name] 10/19/2020,10:08 AM

## 2020-10-19 NOTE — Progress Notes (Signed)
[facility] SURGICAL PROGRESS NOTE  Hospital Day(s): 18.   Post op day(s): 4 Days Post-Op.   Interval History:  Patient seen and examined He reports that his pain control is about the same, slightly better than it has been He is complaining about the smell from his ostomy and says that he thinks it is leaking Continues to complain about peripheral edema Diuresed for 3 L yesterday Surgical drain with 30 ccs out in last 24 hours, serosanguinous Tolerating clear liquids Remains on goal rate TPN Colostomy with 60 ccs out, today the effluent is more consistent with stool Has not been mobilizing, secondary to pain Labs show persistent leukocytosis without significant electrolyte abnormalities.  Vital signs in last 24 hours: [min-max] current  Temp:  [97.7 F (36.5 C)-98.6 F (37 C)] 98.6 F (37 C) (10/24 0912) Pulse Rate:  [89-103] 96 (10/24 0912) Resp:  [16-20] 16 (10/24 0912) BP: (134-158)/(84-110) 145/84 (10/24 0912) SpO2:  [100 %] 100 % (10/24 0912) Weight:  [111.8 kg] 111.8 kg (10/24 0356)     Height: 5\' 6"  (167.6 cm) Weight: 111.8 kg BMI (Calculated): 39.8   Intake/Output last 2 shifts:  10/23 0701 - 10/24 0700 In: 10 [I.V.:10] Out: 3140 [Urine:3050; Drains:30; Stool:60]   Physical Exam:  Constitutional: alert, cooperative and no distress  Respiratory: breathing non-labored at rest  Cardiovascular: regular rate and sinus rhythm  Gastrointestinal: Soft, diffuse soreness, no appreciable distension, no rebound/guarding. Colostomy in left mid-abdomen which is flush to the skin, small amount of loose brown stool in bag, active flatus during examination.  I cannot identify any site of leakage from the ostomy appliance.  JP in RLQ with serosanguinous output Genitourinary: Foley removed Integumentary: Laparoscopic incisions now with skin staples and honeycomb, no appreciable erythema Musculoskeletal: 2+ pitting edema, but softer than on prior evaluation.   Compression hose in place.  Labs:  CBC Latest Ref Rng & Units 10/19/2020 10/18/2020 10/17/2020  WBC 4.0 - 10.5 K/uL 15.2(H) 14.7(H) 13.5(H)  Hemoglobin 13.0 - 17.0 g/dL 10/19/2020) 2.2(Q) 11.6(L)  Hematocrit 39 - 52 % 29.6(L) 30.3(L) 39.7  Platelets 150 - 400 K/uL 383 413(H) 198   CMP Latest Ref Rng & Units 10/19/2020 10/18/2020 10/17/2020  Glucose 70 - 99 mg/dL 10/19/2020) 003(B) 048(G)  BUN 6 - 20 mg/dL 10 9 10   Creatinine 0.61 - 1.24 mg/dL 891(Q 9.45  Sodium 135 - 145 mmol/L 136 137 140  Potassium 3.5 - 5.1 mmol/L 4.1 4.2 4.7  Chloride 98 - 111 mmol/L 100 101 106  CO2 22 - 32 mmol/L 28 27 23   Calcium 8.9 - 10.3 mg/dL 7.9(L) 7.7(L) 8.0(L)  Total Protein 6.5 - 8.1 g/dL - - -  Total Bilirubin 0.3 - 1.2 mg/dL - - -  Alkaline Phos 38 - 126 U/L - - -  AST 15 - 41 U/L - - -  ALT 0 - 44 U/L - - -     Imaging studies: No new pertinent imaging studies   Assessment/Plan:  39 y.o. male 4 Days Post-Op s/p robotic assisted laparoscopic Hartman's Procedure for anastomotic dehiscence which failed attempt at conservative management following initial robotic assisted sigmoid colectomy on 10/06.   - Advance diet to full liquids.             - Continue TPN at goal rate for now;              -Ostomy nurse can work with him on odor minimizing strategies; the appliance appears intact today and I am  not sure what the leaking he feels like he is experiencing could be related to             - Continue IV ABx               - Monitor abdominal examination closely   - Pain control prn  - Antiemetics prn             - Monitor leukocytosis; fever curve; may need repeat imaging  - Continue PO lasix 20 mg for edema; he understands he also needs to ambulate to mobilize fluid             - Continue current surgical drain; monitor and record output             - Pain control prn; antiemetics prn             - Mobilization encouraged; he should receive his pain medication 30 minutes prior to physical therapy.              - Home medications     All of the above findings and recommendations were discussed with the patient, and the medical team, and all of patient's questions were answered to his expressed satisfaction.

## 2020-10-20 LAB — DIFFERENTIAL
Abs Immature Granulocytes: 0.76 10*3/uL — ABNORMAL HIGH (ref 0.00–0.07)
Basophils Absolute: 0.1 10*3/uL (ref 0.0–0.1)
Basophils Relative: 1 %
Eosinophils Absolute: 0.6 10*3/uL — ABNORMAL HIGH (ref 0.0–0.5)
Eosinophils Relative: 5 %
Immature Granulocytes: 5 %
Lymphocytes Relative: 16 %
Lymphs Abs: 2.2 10*3/uL (ref 0.7–4.0)
Monocytes Absolute: 1 10*3/uL (ref 0.1–1.0)
Monocytes Relative: 7 %
Neutro Abs: 9.3 10*3/uL — ABNORMAL HIGH (ref 1.7–7.7)
Neutrophils Relative %: 66 %

## 2020-10-20 LAB — COMPREHENSIVE METABOLIC PANEL
ALT: 20 U/L (ref 0–44)
AST: 18 U/L (ref 15–41)
Albumin: 2.4 g/dL — ABNORMAL LOW (ref 3.5–5.0)
Alkaline Phosphatase: 108 U/L (ref 38–126)
Anion gap: 9 (ref 5–15)
BUN: 10 mg/dL (ref 6–20)
CO2: 27 mmol/L (ref 22–32)
Calcium: 8.3 mg/dL — ABNORMAL LOW (ref 8.9–10.3)
Chloride: 102 mmol/L (ref 98–111)
Creatinine, Ser: 0.71 mg/dL (ref 0.61–1.24)
GFR, Estimated: >60 mL/min (ref >60)
Glucose, Bld: 124 mg/dL — ABNORMAL HIGH (ref 70–99)
Potassium: 4.3 mmol/L (ref 3.5–5.1)
Sodium: 138 mmol/L (ref 135–145)
Total Bilirubin: 0.5 mg/dL (ref 0.3–1.2)
Total Protein: 6.4 g/dL — ABNORMAL LOW (ref 6.5–8.1)

## 2020-10-20 LAB — CBC
HCT: 30.5 % — ABNORMAL LOW (ref 39.0–52.0)
Hemoglobin: 9.6 g/dL — ABNORMAL LOW (ref 13.0–17.0)
MCH: 25.9 pg — ABNORMAL LOW (ref 26.0–34.0)
MCHC: 31.5 g/dL (ref 30.0–36.0)
MCV: 82.4 fL (ref 80.0–100.0)
Platelets: 386 10*3/uL (ref 150–400)
RBC: 3.7 MIL/uL — ABNORMAL LOW (ref 4.22–5.81)
RDW: 14.9 % (ref 11.5–15.5)
WBC: 14 10*3/uL — ABNORMAL HIGH (ref 4.0–10.5)
nRBC: 0 % (ref 0.0–0.2)

## 2020-10-20 LAB — PREALBUMIN: Prealbumin: 9.5 mg/dL — ABNORMAL LOW (ref 18–38)

## 2020-10-20 LAB — PHOSPHORUS: Phosphorus: 3.9 mg/dL (ref 2.5–4.6)

## 2020-10-20 LAB — MAGNESIUM: Magnesium: 2.1 mg/dL (ref 1.7–2.4)

## 2020-10-20 LAB — TRIGLYCERIDES: Triglycerides: 99 mg/dL (ref <150)

## 2020-10-20 MED ORDER — TRACE MINERALS CU-MN-SE-ZN 300-55-60-3000 MCG/ML IV SOLN
INTRAVENOUS | Status: AC
  Filled 2020-10-20: qty 468

## 2020-10-20 NOTE — Consult Note (Signed)
WOC Nurse ostomy follow up Stoma type/location: LLQ, colostomy  Stomal assessment/size: oblong; no stoma visualized; retracted well below skin level; problematic pouching situation Peristomal assessment: intact; slightly reddened  Treatment options for stomal/peristomal skin: using 2" barrier ring to attempt to increase convexity and flexible convex 1pc pouch. Abdomen rounded.  Output liquid brown Ostomy pouching: 1pc.flex convex with 2" barrier ring. May need to add belt at some point, not sure our large belt will fit patient Education provided:  Explained to patient that his stoma is not budded above skin level; retracted below skin level which will increase the complexity of pouching and possibly the frequency of pouch change.,  Patient participated in pouch change (cutting new skin barrier); independent with lock and roll closure and cleaning spout with toilet tissue. He was able to mostly remove the pouch today, however after removal was gagging and belching like he may vomit.  WOC nurse completed remainder of pouch change bc it is very difficult for patient to visualize placement of 1pc pouch. Will need to do this with assistance from wife and/or in front of a mirror.  He was able to remove the tape border tabs and stick the edges down to the skin. Using hand to warm pouching system afterwards.  Education on emptying when 1/3 to 1/2 full and how to empty Patient is VERY concerned about odor; discussed in details.  I will take samples of [employer] lubrication and deodorant drops to the patient at my next visit.    Enrolled patient in [place] Secure Start Discharge program: Yes; patient has tremendous financial burden but because he has private insurance and disability payments he will not qualify for indigent pouch program.   WOC Nurse will follow along with you for continued support with ostomy teaching and care Mykal Batiz [facility] MSN, RN, [place], CNS, [place] [phone]

## 2020-10-20 NOTE — Progress Notes (Signed)
Mobility Specialist - Progress Note   10/20/20 1600  Mobility  Activity Ambulated in hall  Level of Assistance Standby assist, set-up cues, supervision of patient - no hands on  Assistive Device None  Distance Ambulated (ft) 450 ft  Mobility Response Tolerated well  Mobility performed by Mobility specialist  $Mobility charge 1 Mobility    Pre-mobility: 125 HR, 95% SpO2 During mobility: 116 HR, 99% SpO2 Post-mobility: 122 HR, 99% SpO2   Pt was sitting in recliner upon arrival utilizing room air. Pt agreed to session. Pt c/o abdominal pain, but denied fatigue or nausea. Pt was able to stand with SBA and no noted dizziness. Noted that pt takes a pump from inhaler before ambulation. Pt ambulated 230' in hallway before needing an extensive seated rest break. Heavy breathing noted throughout session, however, O2 maintained mid-high 90s during ambulation. Upon return to room, pt was able to return to recliner where he stated "that opened up my lungs a little bit." Max HR this session was 125 bpm. Overall, pt tolerated session well. Pt was left in chair with all needs in reach.   Filiberto Pinks Mobility Specialist 10/20/20, 4:41 PM

## 2020-10-20 NOTE — Consult Note (Addendum)
Pharmacy Antibiotic Note  Jordan [name] is a 39 y.o. male admitted on 10/01/2020 with diverticulitis now 17 Days Post-Op s/p robotic assisted sigmoid colectomy, 3 Days Post-Op s/p robotic assisted laparoscopic Hartman's Procedure  .  Pharmacy has been consulted for vancomycin and Zosyn dosing. His wound culture has grown out ampicillin-resistant Enterococcus.  Today, 10/20/2020  Patient back to OR 10/20 d/t persistent leak at anastomosis s/p colostomy  SCr stable  WBC elevated   Afebrile  10/24 05:26 vancomycin trough = 16  Mcg/mL on 1.25gm IV q8h   Vancomycin course: vanc 10/19 x1 and then put back on it 10/22 unitl today.   Plan:   1) Day 5 of vancomycin at therapeutic levels. Continue Vancomycin 1250 mg IV Q 8 hrs. Goal trough 15-20 mcg/ml, prefer to keep closer to 15 mcg/ml. Will recheck Vancomcyin trough in the next 4 days.   2) Day 14 of pip/tazo. continue Zosyn 3.375g IV q8h (4 hour infusion).    Height: 5\' 6"  (167.6 cm) Weight: 111.8 kg (246 lb 7.6 oz) IBW/kg (Calculated) : 63.8  Temp (24hrs), Avg:98.4 F (36.9 C), Min:98.3 F (36.8 C), Max:98.6 F (37 C)  Recent Labs  Lab 10/16/20 0607 10/17/20 0919 10/17/20 1334 10/18/20 0435 10/19/20 0520 10/20/20 0536  WBC 14.5* 13.5*  --  14.7* 15.2* 14.0*  CREATININE 0.80 0.80  --  0.70 0.70 0.71  VANCOTROUGH  --   --  10*  --  16  --     Estimated Creatinine Clearance: 144.1 mL/min (by C-G formula based on SCr of 0.71 mg/dL).    Allergies  Allergen Reactions  . Butrans [Buprenorphine Hcl]     Difficulty breathing, n/v  . Nucynta [Tapentadol Hydrochloride] Anaphylaxis  . Tapentadol Hcl Anaphylaxis  . Glucose Polymer Other (See Comments)    poison  . Lactose Intolerance (Gi)   . Sumac     poison  . Bactrim [Sulfamethoxazole-Trimethoprim] Itching and Rash    Antimicrobials this admission: 10/12 Zosyn >>  10/19 vancomycin >>   Microbiology results: 10/09 SARS CoV-2: negative  10/09  influenza A/B: negative 10/15 WCx: E coli, E raffinosus, beta lactamase (+) 10/12 WCx: E coli  Thank you for allowing pharmacy to be a part of this patient's care.  12/12, PharmD Clinical Pharmacist  10/20/2020 1:41 PM

## 2020-10-20 NOTE — Consult Note (Signed)
PHARMACY - TOTAL PARENTERAL NUTRITION CONSULT NOTE   Indication: Prolonged NPO  Patient Measurements: Height: 5\' 6"  (167.6 cm) Weight: 111.8 kg (246 lb 7.6 oz) IBW/kg (Calculated) : 63.8 TPN AdjBW (KG): 80.3 Body mass index is 39.78 kg/m.  Assessment:  Patient is a 39 y/o M with medical history including morbid obesity, hypertension, diverticulitis who is s/p sigmoid colectomy for colovesical fistula and subsequent Hartman's Procedurefor anastomotic dehiscence. Post-operative course complicated by  surgical wound infection. Pharmacy has been consulted to initiate TPN for anticipated prolonged NPO status.   Glucose / Insulin: No history of diabetes. Normoglycemic Electrolytes: WNL Renal: Scr < 1 (stable and c/w baseline) LFTs / TGs: LFTs within normal limits. TG 156 > 137  Prealbumin / albumin: Albumin 2.6 on 10/15 Intake / Output; MIVF:  10/19 0701 - 10/20 0700 In: 2424.3 [I.V.:1692.3; IV Piggyback:727] Out: 2240 [Urine:2200; Drains:40]  GI Imaging: 10/12 CT Abdomen/Pelvis: 1. Postoperative findings of sigmoid colon resection and reanastomosis. There is a frank defect at the right aspect of the anastomosis with air and fluid tracking superiorly into the central small bowel mesentery. Although very elongated and irregular in shape, the largest, most central component of air and fluid in the central small bowel mesentery measures at least 13.3 x 4.8 cm. 2. The small bowel is diffusely fluid-filled without a distinctly identified transition point and there is gas present throughout the colon to the rectum. There are thickened loops of small bowel adjacent to air and fluid within the mesentery, likely reactive to adjacent infection and overall findings most consistent with ileus. 3. Trace free fluid in the low abdomen and pelvis in addition to minimal pneumoperitoneum. There is subcutaneous emphysema about the anterior left hemiabdomen and a small volume of emphysema about the lower  esophagus. Findings are generally in keeping with recent laparoscopy. 10/15 CT Abdomen/Pelvis: 1. Signs of persistent anastomotic dehiscence of status post sigmoid resection. 2. Dilated loops of small bowel noted which are favored to represent ileus. 10/20 CT Abdomen/Pelvis: 1.Worsening small bowel ileus and signs of enteritis likely related to peritoneal and mesenteric inflammation. 2. Interval placement of a LEFT lower quadrant drain with decompression of some of the areas of fluid and gas in the pelvis but with persistent large volume pneumoperitoneum and layering of fluid within this pneumoperitoneum in the mid abdomen. Findings are compatible with diffuse dissemination of leaked bowel contents into the upper abdomen.  Surgeries / Procedures:  10/6 Robotic assisted sigmoid colectomy 10/12 I&D of post-operative wound infection 10/20 laparoscopic Hartman's Procedure   Central access: 10/09/20  TPN start date: 10/09/20   Nutritional Goals (per RD recommendation on 10/13): kCal: 2900-3200, Protein: 130-145g, Fluid: 2 - 2.3L Goal TPN rate is 90 mL/hr (provides 140 g of protein and 3000 kcals per day)  Current Nutrition:  CLD  Plan:   DECREASE TPN rate to 45 mL/hr, per d/w 11/13, PA-C  Nutritional Components:  Clinisol-amino acid 15%: 65g/L  Dextrose 21%, 771.12 kcal  Lipids 20%: 42 g/L,   Calories: 1505.92 kCal  Electrolytes in TPN: standard, Cl:Ac ratio 1:1  Add standard MVI and trace elements to TPN  Continue MIVF at 10 mL/hr, per d/w Lynden Oxford, PA-C  Monitor TPN labs on Mon/Thurs since electrolytes have been stable   Jordan [name] 10/20/2020,8:08 AM

## 2020-10-20 NOTE — Progress Notes (Addendum)
[facility] SURGICAL PROGRESS NOTE  Hospital Day(s): 19.   Post op day(s): 5 Days Post-Op.   Interval History:  Patient seen and examined no acute events or new complaints overnight.  Patient reports he is still having abdominal pain, improving minimally No fever, chills, nausea, emesis He is complaining of leaking around drain sites Remains with stable, but fluctuating, leukocytosis, now 14.0 (Trend: 13.5 --> 14.7 --> 15.2 --> 14.0) Renal function remains normal, sCr - 0.71, UO - 4.6L No significant electrolyte derangements  Remains on TPN at goal rate; diet advancing  Colostomy with 100 ccs out in last 24 hours reported; stool Working with therapies; recommending SNF; pain seems to be biggest limiting factor   Vital signs in last 24 hours: [min-max] current  Temp:  [98.2 F (36.8 C)-98.6 F (37 C)] 98.3 F (36.8 C) (10/25 0412) Pulse Rate:  [95-109] 95 (10/25 0412) Resp:  [16-20] 20 (10/25 0412) BP: (128-165)/(62-104) 133/76 (10/25 0412) SpO2:  [100 %] 100 % (10/25 0412)     Height: 5\' 6"  (167.6 cm) Weight: 111.8 kg BMI (Calculated): 39.8   Intake/Output last 2 shifts:  10/24 0701 - 10/25 0700 In: 2510.1 [I.V.:545.2; IV Piggyback:1964.9] Out: 4700 [Urine:4600; Stool:100]   Physical Exam:  Constitutional: alert, cooperative and no distress  Respiratory: breathing non-labored at rest  Cardiovascular: regular rate and sinus rhythm  Gastrointestinal:Soft,diffuse soreness, no appreciable distension, no rebound/guarding. Colostomy in left mid-abdomen which is flush to the skin, small amount of loose brown stool in bag, active flatus during examination.  JP in RLQ with serosanguinous output, he is leaking around drain Integumentary:Laparoscopic incisions now with skin staples and honeycomb, no appreciable erythema Musculoskeletal:1+ pittingedema, but softer than on prior evaluation.  Compression hose in place   Labs:  CBC Latest Ref Rng & Units  10/20/2020 10/19/2020 10/18/2020  WBC 4.0 - 10.5 K/uL 14.0(H) 15.2(H) 14.7(H)  Hemoglobin 13.0 - 17.0 g/dL 10/20/2020) 4.4(R) 1.5(Q)  Hematocrit 39 - 52 % 30.5(L) 29.6(L) 30.3(L)  Platelets 150 - 400 K/uL 386 383 413(H)   CMP Latest Ref Rng & Units 10/20/2020 10/19/2020 10/18/2020  Glucose 70 - 99 mg/dL 10/20/2020) 676(P) 950(D)  BUN 6 - 20 mg/dL 10 10 9   Creatinine 0.61 - 1.24 mg/dL 326(Z 1.24  Sodium 135 - 145 mmol/L 138 136 137  Potassium 3.5 - 5.1 mmol/L 4.3 4.1 4.2  Chloride 98 - 111 mmol/L 102 100 101  CO2 22 - 32 mmol/L 27 28 27   Calcium 8.9 - 10.3 mg/dL 8.3(L) 7.9(L) 7.7(L)  Total Protein 6.5 - 8.1 g/dL 6.4(L) - -  Total Bilirubin 0.3 - 1.2 mg/dL 0.5 - -  Alkaline Phos 38 - 126 U/L 108 - -  AST 15 - 41 U/L 18 - -  ALT 0 - 44 U/L 20 - -     Imaging studies: No new pertinent imaging studies   Assessment/Plan:  39 y.o. male 5 Days Post-Op s/p robotic assisted laparoscopic Hartman's Procedurefor anastomotic dehiscence which failed attempt at conservative management following initialrobotic assisted sigmoid colectomyon 10/06.   - Will advance to soft diet + nutritional supplementation   - Begin to wean TPN to 1/2 rate tonight if tolerating soft foods  - ContinueIV ABx (Zosyn); follow up Cx obtained during I&Dad drain placement--> growing E coli(sensitive to Zosyn); and and enterococcus (resistant to ampicillin) --> will add vancomycin;Day7/10--> If leukocytosis resolves we can consider stopping this on Day 10 - Monitor abdominal examination; colostomy function             -  Pain control prn; Antiemetics prn - Monitor leukocytosis; fever curve; may need repeat imaging             - Continue PO lasix 20 mg for edema; he understands he also needs to ambulate to mobilize fluid - Continue current surgical drain; monitor and record output - Pain control prn; antiemetics prn - Mobilization encouraged; he should receive  his pain medication 30 minutes prior to physical therapy.  - appreciate WOC RN assistance with ostomy teaching / care   All of the above findings and recommendations were discussed with the patient, and the medical team, and all of patient's questions were answered to his expressed satisfaction.  -- Lynden Oxford, PA-C [facility] 10/20/2020, 7:36 AM [phone] M-F: 7am - 4pm  I saw and evaluated the patient.  I agree with the above documentation, exam, and plan, which I have edited where appropriate. Duanne Guess  10:38 AM

## 2020-10-20 NOTE — Progress Notes (Signed)
Physical Therapy Treatment Patient Details Name: Jordan [name] MRN: [id number] DOB: 07-12-1981 Today's Date: 10/20/2020    History of Present Illness Jordan [name] is a 39 y.o. male 16 days Post-Op s/p robotic assisted sigmoid colectomy for complicated diverticulitis with colovesical fistula, complicated by pertinent comorbidities including COVID+ and post-op wound infection. Pt s/p I&D of surgical site, currently on TPN  for anticipated prolonged NPO status. Recently began trial of clear liquids. Pt underwent laparscopic Hartman's procedure on 10/20.    PT Comments    Pt sitting up in recliner chair upon arrival to room and agreeable to PT session. Pt grimacing in pain and grasping abdomen during movements and abdominal sounds, which can be heard from across the room. Pt stood from recliner chair with supervision only and was slow to rise secondary to abdominal pulling with hip and trunk extension to come into standing posture. Pt took 2 puffs from inhaler prior to ambulation. Pt utilized IV pole for ambulation and ambulated 1 lap around nurses station with wide BOS and slightly decreased gait speed with supervision. Not overt LOB noted with ambulation distance. Pt stood statically in room to remove soiled towel placed under abdominal pannus to catch drainage from incisions. Pt with good static balance noted as pt able to perform without UE support for balance. Pt returned to recliner chair to replace clean towel. Upgrading discharge recommendation to HHPT due to ambulation distance performed today. Anticipate pt still requires increased assistance for supine <> sit secondary to abdominal pain however made good progress during today's session as he participated in OOB activities.     Follow Up Recommendations  Home health PT     Equipment Recommendations  None recommended by PT    Recommendations for Other Services       Precautions / Restrictions  Precautions Precautions: Fall Restrictions Weight Bearing Restrictions: No    Mobility  Bed Mobility               General bed mobility comments: not performed as pt sitting up in chair upon arrival  Transfers Overall transfer level: Needs assistance Equipment used: None Transfers: Sit to/from Stand Sit to Stand: Supervision         General transfer comment: supervision for safety; pt slow to rise and return to sitting  Ambulation/Gait Ambulation/Gait assistance: Supervision Gait Distance (Feet): 220 Feet Assistive device: IV Pole Gait Pattern/deviations: Wide base of support;Step-through pattern Gait velocity: 10' in 9"   General Gait Details: no overt LOB or difficulties ambulating pushing IV pole in hallway   Stairs             Wheelchair Mobility    Modified Rankin (Stroke Patients Only)       Balance Overall balance assessment: Needs assistance Sitting-balance support: No upper extremity supported;Feet supported Sitting balance-Leahy Scale: Good Sitting balance - Comments: back supported in recliner chair   Standing balance support: Single extremity supported;No upper extremity supported Standing balance-Leahy Scale: Good Standing balance comment: no overt LOB with or without UE support for static standing; single UE support on IV                            Cognition Arousal/Alertness: Awake/alert Behavior During Therapy: WFL for tasks assessed/performed Overall Cognitive Status: Within Functional Limits for tasks assessed  Exercises Other Exercises Other Exercises: additional time taken to remove soiled towels and replace towel tucked under abdominal pannus    General Comments General comments (skin integrity, edema, etc.): noted towel tucked under abdominal pannus with yellow colored drainage from abdominal incisions; dressing to LLQ appeared saturated and was falling  off, held on by tape at distal end      Pertinent Vitals/Pain Pain Assessment: Faces Faces Pain Scale: Hurts even more Pain Location: generalized abdominal region Pain Descriptors / Indicators: Burning;Sharp;Aching;Discomfort Pain Intervention(s): Monitored during session;Repositioned    Home Living                      Prior Function            PT Goals (current goals can now be found in the care plan section) Acute Rehab PT Goals Patient Stated Goal: to go home PT Goal Formulation: With patient Time For Goal Achievement: 10/31/20 Potential to Achieve Goals: Good Progress towards PT goals: Progressing toward goals    Frequency    Min 2X/week      PT Plan Discharge plan needs to be updated    Co-evaluation              AM-PAC PT "6 Clicks" Mobility   Outcome Measure  Help needed turning from your back to your side while in a flat bed without using bedrails?: A Lot Help needed moving from lying on your back to sitting on the side of a flat bed without using bedrails?: A Lot Help needed moving to and from a bed to a chair (including a wheelchair)?: A Little Help needed standing up from a chair using your arms (e.g., wheelchair or bedside chair)?: None Help needed to walk in hospital room?: A Little Help needed climbing 3-5 steps with a railing? : A Lot 6 Click Score: 16    End of Session   Activity Tolerance: Patient tolerated treatment well;Patient limited by pain Patient left: in chair;with call bell/phone within reach Nurse Communication: Mobility status PT Visit Diagnosis: Unsteadiness on feet (R26.81);Muscle weakness (generalized) (M62.81);Pain;Other abnormalities of gait and mobility (R26.89) Pain - Right/Left:  (central) Pain - part of body:  (abdomen)     Time: 2778-2423 PT Time Calculation (min) (ACUTE ONLY): 26 min  Charges:                        Frederich Chick, SPT   Frederich Chick 10/20/2020, 3:30 PM

## 2020-10-21 LAB — BASIC METABOLIC PANEL
Anion gap: 8 (ref 5–15)
BUN: 9 mg/dL (ref 6–20)
CO2: 28 mmol/L (ref 22–32)
Calcium: 8.1 mg/dL — ABNORMAL LOW (ref 8.9–10.3)
Chloride: 99 mmol/L (ref 98–111)
Creatinine, Ser: 0.69 mg/dL (ref 0.61–1.24)
GFR, Estimated: >60 mL/min (ref >60)
Glucose, Bld: 125 mg/dL — ABNORMAL HIGH (ref 70–99)
Potassium: 4.2 mmol/L (ref 3.5–5.1)
Sodium: 135 mmol/L (ref 135–145)

## 2020-10-21 LAB — CBC
HCT: 29.5 % — ABNORMAL LOW (ref 39.0–52.0)
Hemoglobin: 9.3 g/dL — ABNORMAL LOW (ref 13.0–17.0)
MCH: 26.1 pg (ref 26.0–34.0)
MCHC: 31.5 g/dL (ref 30.0–36.0)
MCV: 82.6 fL (ref 80.0–100.0)
Platelets: 376 10*3/uL (ref 150–400)
RBC: 3.57 MIL/uL — ABNORMAL LOW (ref 4.22–5.81)
RDW: 15.1 % (ref 11.5–15.5)
WBC: 12.1 10*3/uL — ABNORMAL HIGH (ref 4.0–10.5)
nRBC: 0 % (ref 0.0–0.2)

## 2020-10-21 LAB — MAGNESIUM: Magnesium: 2.1 mg/dL (ref 1.7–2.4)

## 2020-10-21 LAB — PHOSPHORUS: Phosphorus: 3.9 mg/dL (ref 2.5–4.6)

## 2020-10-21 MED ORDER — LACTASE 3000 UNITS PO TABS
6000.0000 [IU] | ORAL_TABLET | Freq: Three times a day (TID) | ORAL | Status: DC
  Administered 2020-10-21 – 2020-10-23 (×3): 6000 [IU] via ORAL
  Filled 2020-10-21 (×11): qty 2

## 2020-10-21 MED ORDER — ENSURE ENLIVE PO LIQD
237.0000 mL | Freq: Three times a day (TID) | ORAL | Status: DC
  Administered 2020-10-21 – 2020-10-24 (×4): 237 mL via ORAL

## 2020-10-21 NOTE — Progress Notes (Signed)
Nutrition Follow Up Note   DOCUMENTATION CODES:   Morbid obesity  INTERVENTION:   Ensure Enlive po TID, each supplement provides 350 kcal and 20 grams of protein  Vitamin C 268m po BID  Recommend Lactaid TID with meals   NUTRITION DIAGNOSIS:   Inadequate oral intake related to acute illness as evidenced by NPO status. -resolving   GOAL:   Patient will meet greater than or equal to 90% of their needs  -met with TPN   MONITOR:   PO intake, Diet advancement, Labs, Weight trends, Skin, I & O's  ASSESSMENT:   39y/o male with h/o anxiety, depression, chronic pain syndrome, HTN, OSA, morbid obesity, kidney stones, asthma and diverticulitis who is s/p robotic assisted sigmoid colectomy for complicated diverticulitis 10/6 with colovesical fistula, complicated by COVID 19 infection and anastomotic leak   Pt s/p incisional I & D 10/12 Pt s/p IR drain placement 10/15 Pt s/p laparoscopic colostomy and LOA 10/20  Met with pt in room today. Pt in good spirits today and appears to be feeling much better. Pt reports that his appetite is slowly improving. Pt reports eating an omelet and some yogurt for breakfast this morning. Pt does report being a little [place] after breakfast that he believes is r/t him being lactose intolerant. Pt reports that he is unable to tolerate milk or anything with a lot of lactose. RD discussed with pt adequate nutrition needed for post-op healing. Pt is willing to drink vanilla or strawberry Ensure as this is lactose free. Recommend Lactaid with meals to help pt digest lactose. Plan is to discontinue TPN today. Pt advanced to a regular diet. Pt is mobilizing.   Per chart, pt is down ~36lbs(12%) below his UBW.   Medications reviewed and include: vitamin C, lasix, protonix, zosyn, vancomycin   Labs reviewed: K 4.2 wnl, P 3.9 wnl, Mg 2.1 wnl Wbc- 12.1(H), Hgb 9.3(L), Hct 29.5(L)  Diet Order:   Diet Order            Diet regular Room service appropriate?  Yes; Fluid consistency: Thin  Diet effective now                EDUCATION NEEDS:   No education needs have been identified at this time  Skin:  Skin Assessment: Reviewed RN Assessment (incision abdomen)  Last BM:  10/25- 721mvia ostomy  Height:   Ht Readings from Last 1 Encounters:  10/01/20 '5\' 6"'  (1.676 m)    Weight:   Wt Readings from Last 1 Encounters:  10/21/20 113.7 kg    Ideal Body Weight:  64.5 kg  BMI:  Body mass index is 40.46 kg/m.  Estimated Nutritional Needs:   Kcal:  2900-3200kcal/day  Protein:  130-145g/day  Fluid:  2-2.3L/day  CaKoleen DistanceS, RD, LDN Please refer to AMH[facility] Surgeryor RD and/or RD on-call/weekend/after hours pager

## 2020-10-21 NOTE — Progress Notes (Signed)
[facility] SURGICAL PROGRESS NOTE  Hospital Day(s): 20.   Post op day(s): 6 Days Post-Op.   Interval History:  Patient seen and examined No acute events or new complaints overnight.  Patient reports he is doing better, he is in best spirits I have seen this morning No fever, chills, nausea, emesis No new labs this morning.  Continues to diurese, UO - 6L  TPN weaned to 1/2 rate yesterday He has tolerated soft diet yesterday as well Working with therapies; progressing well; now recommending HHPT Continues on Zosyn/Vancomycin; Day 6/10 of this regimen  Vital signs in last 24 hours: [min-max] current  Temp:  [98.3 F (36.8 C)-99.1 F (37.3 C)] 99.1 F (37.3 C) (10/26 0430) Pulse Rate:  [91-105] 91 (10/26 0430) Resp:  [16-18] 16 (10/26 0430) BP: (120-142)/(70-96) 120/70 (10/26 0430) SpO2:  [98 %-100 %] 100 % (10/26 0430) Weight:  [113.7 kg] 113.7 kg (10/26 0433)     Height: 5\' 6"  (167.6 cm) Weight: 113.7 kg BMI (Calculated): 40.48   Intake/Output last 2 shifts:  10/25 0701 - 10/26 0700 In: -  Out: 6520 [Urine:6450; Stool:70]   Physical Exam:  Constitutional: alert, cooperative and no distress  Respiratory: breathing non-labored at rest  Cardiovascular: regular rate and sinus rhythm  Gastrointestinal:Soft,diffuse soreness, no appreciable distension, no rebound/guarding. Colostomy in left mid-abdomen which is flush to the skin, small amount of loose brown stool in bag,active flatus during examination. JP in RLQ with serosanguinous output, he is leaking around drain Integumentary:Laparoscopic incisions now with skin staples and honeycomb, no appreciable erythema Musculoskeletal:1+ pittingedema, but softer than on prior evaluation. Compression hose in place   Labs:  CBC Latest Ref Rng & Units 10/20/2020 10/19/2020 10/18/2020  WBC 4.0 - 10.5 K/uL 14.0(H) 15.2(H) 14.7(H)  Hemoglobin 13.0 - 17.0 g/dL 10/20/2020) 2.7(N) 1.7(G)  Hematocrit 39 - 52 % 30.5(L)  29.6(L) 30.3(L)  Platelets 150 - 400 K/uL 386 383 413(H)   CMP Latest Ref Rng & Units 10/20/2020 10/19/2020 10/18/2020  Glucose 70 - 99 mg/dL 10/20/2020) 494(W) 967(R)  BUN 6 - 20 mg/dL 10 10 9   Creatinine 0.61 - 1.24 mg/dL 916(B 8.46  Sodium 135 - 145 mmol/L 138 136 137  Potassium 3.5 - 5.1 mmol/L 4.3 4.1 4.2  Chloride 98 - 111 mmol/L 102 100 101  CO2 22 - 32 mmol/L 27 28 27   Calcium 8.9 - 10.3 mg/dL 8.3(L) 7.9(L) 7.7(L)  Total Protein 6.5 - 8.1 g/dL 6.4(L) - -  Total Bilirubin 0.3 - 1.2 mg/dL 0.5 - -  Alkaline Phos 38 - 126 U/L 108 - -  AST 15 - 41 U/L 18 - -  ALT 0 - 44 U/L 20 - -    Imaging studies: No new pertinent imaging studies   Assessment/Plan:  39 y.o. male making good progress 6 Days Post-Op s/p robotic assisted laparoscopic Hartman's Procedurefor anastomotic dehiscence which failed attempt at conservative management following initialrobotic assisted sigmoid colectomyon 10/06.   - Continue regular diet + nutritional supplementation          - Okay to stop TPN this evening   - ContinueIV ABx (Zosyn); follow up Cx obtained during I&Dad drain placement--> growing E coli(sensitive to Zosyn); and and enterococcus (resistant to ampicillin) --> added vancomycin;Day6/10--> If leukocytosis resolves and afebrile we can consider stopping this after Day 10   - Monitor abdominal examination; colostomy function - Pain control prn; Antiemetics prn - Monitor leukocytosis; fever curve;may need repeat imaging - Continue PO lasix 20 mg for edema;  he understands he also needs to ambulate to mobilize fluid - Continue current surgical drain; monitor and record output - Pain control prn; antiemetics prn - Mobilization encouraged; he should receive his pain medication 30 minutes prior to physical therapy.             - appreciate WOC RN assistance with ostomy teaching / care     - Discharge Planning: I would  like to ensure his leucocytosis continues to resolve prior to discharge. He has otherwise progressed well. Anticipate discharge around the end of this week pending clinical condition.   All of the above findings and recommendations were discussed with the patient, and the medical team, and all of patient's questions were answered to his expressed satisfaction.  -- Lynden Oxford, PA-C [facility] 10/21/2020, 7:29 AM [phone] M-F: 7am - 4pm

## 2020-10-21 NOTE — Progress Notes (Signed)
Occupational Therapy Treatment Patient Details Name: Jordan [name] MRN: [id number] DOB: Apr 01, 1981 Today's Date: 10/21/2020    History of present illness Jordan "Jordan [name]" [name] is a 39 y.o. male 16 days Post-Op s/p robotic assisted sigmoid colectomy for complicated diverticulitis with colovesical fistula, complicated by pertinent comorbidities including COVID+ and post-op wound infection. Pt s/p I&D of surgical site, currently on TPN  for anticipated prolonged NPO status. Recently began trial of clear liquids. Pt underwent laparscopic Hartman's procedure on 10/20.   OT comments  Pt seen for OT treatment this date to f/u re: safety with ADLs/ADL mobility. Pt declines to get up with OT at this time citing fatigue from having walked 4 laps today already. Mobility tech note indicates pt getting up with MOD I. Pt agreeable to modification education given difficulty bending at the waist post-op'ly. OT provides below-listed education including AE for LB ADL use. Pt with good understanding. Because pt's mobility has vastly improved with therapy, anticipate HHOT would be more appropriate d/c recommendation and updated as such.    Follow Up Recommendations  Home health OT;Supervision - Intermittent    Equipment Recommendations  3 in 1 bedside commode;Tub/shower seat;Other (comment) (AE for LB ADLs)    Recommendations for Other Services      Precautions / Restrictions Precautions Precautions: Fall Precaution Comments: colostomy Restrictions Weight Bearing Restrictions: No Other Position/Activity Restrictions: Pt reports MD suggested-no ativity that strains abdomen       Mobility Bed Mobility               General bed mobility comments: not performed as pt sitting up in chair upon arrival  Transfers                 General transfer comment: pt reports walking 4 laps today, declines getting up with OT at this time. MOD I per prior documentation    Balance  Overall balance assessment: Needs assistance Sitting-balance support: No upper extremity supported;Feet supported Sitting balance-Leahy Scale: Good Sitting balance - Comments: back supported in recliner chair                                   ADL either performed or assessed with clinical judgement   ADL Overall ADL's : Needs assistance/impaired                     Lower Body Dressing: Moderate assistance;Maximal assistance;Sitting/lateral leans Lower Body Dressing Details (indicate cue type and reason): limited ROM of R LE and limited tolerance for cross-leg technique to modify given s/p abdominal sx.                     Vision Baseline Vision/History: Wears glasses Wears Glasses: Reading only Patient Visual Report: No change from baseline     Perception     Praxis      Cognition Arousal/Alertness: Awake/alert Behavior During Therapy: WFL for tasks assessed/performed Overall Cognitive Status: Within Functional Limits for tasks assessed                                 General Comments: Pain limited, and brief with answers to therapist questions, but overall WFL.        Exercises Other Exercises Other Exercises: OT facilitates ed re: use of AE for LB ADLs, safety considerations s/p abd sx. Gentle oblique activities  to address core strength without straining central core/surgical site, UB therex that pt can perform seated with mindfullness of not being able to lift heavy weight. Pt with good reception of all education provided.   Shoulder Instructions       General Comments      Pertinent Vitals/ Pain       Pain Assessment: Faces Faces Pain Scale: Hurts even more Pain Descriptors / Indicators: Discomfort;Aching Pain Intervention(s): Monitored during session  Home Living                                          Prior Functioning/Environment              Frequency  Min 1X/week        Progress  Toward Goals  OT Goals(current goals can now be found in the care plan section)  Progress towards OT goals: Progressing toward goals  Acute Rehab OT Goals Patient Stated Goal: to go home OT Goal Formulation: With patient Time For Goal Achievement: 10/31/20 Potential to Achieve Goals: Good  Plan Discharge plan needs to be updated    Co-evaluation                 AM-PAC OT "6 Clicks" Daily Activity     Outcome Measure   Help from another person eating meals?: A Little Help from another person taking care of personal grooming?: A Little Help from another person toileting, which includes using toliet, bedpan, or urinal?: A Lot Help from another person bathing (including washing, rinsing, drying)?: A Lot Help from another person to put on and taking off regular upper body clothing?: A Lot Help from another person to put on and taking off regular lower body clothing?: A Lot 6 Click Score: 14    End of Session    OT Visit Diagnosis: Other abnormalities of gait and mobility (R26.89);Pain Pain - Right/Left:  (abdomen)   Activity Tolerance Patient limited by fatigue;Patient limited by pain   Patient Left in chair;with call bell/phone within reach   Nurse Communication          Time: 1610-9604 OT Time Calculation (min): 47 min  Charges: OT General Charges $OT Visit: 1 Visit OT Treatments $Self Care/Home Management : 38-52 mins  Rejeana Brock, MS, OTR/L ascom [phone] 10/21/20, 4:04 PM

## 2020-10-21 NOTE — Progress Notes (Signed)
Mobility Specialist - Progress Note   10/21/20 1400  Mobility  Activity Ambulated in room  Level of Assistance Modified independent, requires aide device or extra time  Assistive Device Front wheel walker  Distance Ambulated (ft) 20 ft  Mobility Response Tolerated well  Mobility performed by Mobility specialist  $Mobility charge 1 Mobility    Upon arrival to room, pt was seen up and ambulating to bathroom modI. Pt was independent in hygiene. Pt stated that he's ambulated 4 laps in the hallway today prior to session. Noted heavy breathing, and rest breaks taken d/t pain in abdomen. Overall, pt tolerated well. Pt was left in recliner with all needs in reach and no complaints at this time.   Filiberto Pinks Mobility Specialist 10/21/20, 2:29 PM

## 2020-10-21 NOTE — Consult Note (Signed)
PHARMACY - TOTAL PARENTERAL NUTRITION CONSULT NOTE   Indication: Prolonged NPO  Patient Measurements: Height: 5\' 6"  (167.6 cm) Weight: 113.7 kg (250 lb 10.6 oz) IBW/kg (Calculated) : 63.8 TPN AdjBW (KG): 80.3 Body mass index is 40.46 kg/m.  Assessment:  Patient is a 39 y/o M with medical history including morbid obesity, hypertension, diverticulitis who is s/p sigmoid colectomy for colovesical fistula and subsequent Hartman's Procedurefor anastomotic dehiscence. Post-operative course complicated by  surgical wound infection. Pharmacy has been consulted to initiate TPN for anticipated prolonged NPO status.   Glucose / Insulin: No history of diabetes. Normoglycemic Electrolytes: WNL Renal: Scr < 1 (stable and c/w baseline) LFTs / TGs: LFTs within normal limits. TG 156 > 137  Prealbumin / albumin: Albumin 2.6 on 10/15 Intake / Output; MIVF:   GI Imaging: 10/12 CT Abdomen/Pelvis: 1. Postoperative findings of sigmoid colon resection and reanastomosis. There is a frank defect at the right aspect of the anastomosis with air and fluid tracking superiorly into the central small bowel mesentery. Although very elongated and irregular in shape, the largest, most central component of air and fluid in the central small bowel mesentery measures at least 13.3 x 4.8 cm. 2. The small bowel is diffusely fluid-filled without a distinctly identified transition point and there is gas present throughout the colon to the rectum. There are thickened loops of small bowel adjacent to air and fluid within the mesentery, likely reactive to adjacent infection and overall findings most consistent with ileus. 3. Trace free fluid in the low abdomen and pelvis in addition to minimal pneumoperitoneum. There is subcutaneous emphysema about the anterior left hemiabdomen and a small volume of emphysema about the lower esophagus. Findings are generally in keeping with recent laparoscopy. 10/15 CT Abdomen/Pelvis: 1. Signs of  persistent anastomotic dehiscence of status post sigmoid resection. 2. Dilated loops of small bowel noted which are favored to represent ileus. 10/20 CT Abdomen/Pelvis: 1.Worsening small bowel ileus and signs of enteritis likely related to peritoneal and mesenteric inflammation. 2. Interval placement of a LEFT lower quadrant drain with decompression of some of the areas of fluid and gas in the pelvis but with persistent large volume pneumoperitoneum and layering of fluid within this pneumoperitoneum in the mid abdomen. Findings are compatible with diffuse dissemination of leaked bowel contents into the upper abdomen.  Surgeries / Procedures:  10/6 Robotic assisted sigmoid colectomy 10/12 I&D of post-operative wound infection 10/20 laparoscopic Hartman's Procedure   Central access: 10/09/20  TPN start date: 10/09/20   Nutritional Goals (per RD recommendation on 10/13): kCal: 2900-3200, Protein: 130-145g, Fluid: 2 - 2.3L Goal TPN rate is 90 mL/hr (provides 140 g of protein and 3000 kcals per day)  Current Nutrition:  CLD  Plan:   Per 11/13, PA-C notes, TPN will be stopped this evening. Pharmacy will sign-off at this time.    Lynden Oxford 10/21/2020,8:55 AM

## 2020-10-22 LAB — BASIC METABOLIC PANEL
Anion gap: 8 (ref 5–15)
BUN: 9 mg/dL (ref 6–20)
CO2: 28 mmol/L (ref 22–32)
Calcium: 8.3 mg/dL — ABNORMAL LOW (ref 8.9–10.3)
Chloride: 101 mmol/L (ref 98–111)
Creatinine, Ser: 0.68 mg/dL (ref 0.61–1.24)
GFR, Estimated: >60 mL/min (ref >60)
Glucose, Bld: 106 mg/dL — ABNORMAL HIGH (ref 70–99)
Potassium: 4.2 mmol/L (ref 3.5–5.1)
Sodium: 137 mmol/L (ref 135–145)

## 2020-10-22 LAB — CBC
HCT: 26.9 % — ABNORMAL LOW (ref 39.0–52.0)
Hemoglobin: 8.6 g/dL — ABNORMAL LOW (ref 13.0–17.0)
MCH: 26.1 pg (ref 26.0–34.0)
MCHC: 32 g/dL (ref 30.0–36.0)
MCV: 81.5 fL (ref 80.0–100.0)
Platelets: 353 10*3/uL (ref 150–400)
RBC: 3.3 MIL/uL — ABNORMAL LOW (ref 4.22–5.81)
RDW: 14.9 % (ref 11.5–15.5)
WBC: 11 10*3/uL — ABNORMAL HIGH (ref 4.0–10.5)
nRBC: 0 % (ref 0.0–0.2)

## 2020-10-22 NOTE — Progress Notes (Signed)
Physical Therapy Treatment Patient Details Name: Jordan [name] MRN: [id number] DOB: [birth year] Today's Date: 10/22/2020    History of Present Illness Jordan [name] "Jordan [name]" [name] is a 39 y.o. male 16 days Post-Op s/p robotic assisted sigmoid colectomy for complicated diverticulitis with colovesical fistula, complicated by pertinent comorbidities including COVID+ and post-op wound infection. Pt s/p I&D of surgical site, currently on TPN  for anticipated prolonged NPO status. Recently began trial of clear liquids. Pt underwent laparscopic Hartman's procedure on 10/20.    PT Comments    Pt presents in fowler's position on arrival to room. Pt reports 7/10 pain in his abdomen, feeling bloated, and increased swelling his his LE. He reports numbness in his feet with bilateral decreased sensation. He is agreeable to bed level exercises. After performing exercises, pt reports needing to empty his colostomy bag. He is modified independent with all bed mobility as he takes increased time and relies heavily on bed rail for support. He walks with supervision to bathroom with IV pole. Pt is in increased pain and throws up while emptying bag. Pt walks back to bed and sits EOB with elevated surface. Nursing notified of patient's needs. He is left in bed with nursing in room. Pt encouraged to continue moving throughout his day. Pt continues to benefit from skilled PT services to maximize return to PLOF and minimize risk of future falls, injury, caregiver burden, and readmission. Will continue to follow POC. Discharge recommendation remains appropriate.   Follow Up Recommendations  Home health PT     Equipment Recommendations  None recommended by PT    Recommendations for Other Services       Precautions / Restrictions Precautions Precautions: Fall Precaution Comments: colostomy Restrictions Weight Bearing Restrictions: No Other Position/Activity Restrictions: Pt reports MD suggested-no  ativity that strains abdomen    Mobility  Bed Mobility Overal bed mobility: Modified Independent Bed Mobility: Supine to Sit;Sit to Supine     Supine to sit: Modified independent (Device/Increase time) Sit to supine: Modified independent (Device/Increase time)   General bed mobility comments: Pt is mod I with bed mobility as he doesn't require any outside assistance but takes increased time and uses bedrails  Transfers Overall transfer level: Modified independent Equipment used: None Transfers: Sit to/from Stand Sit to Stand: Modified independent (Device/Increase time)         General transfer comment: Pt able to perform safely without any outside assistance but takes increased time due to pain  Ambulation/Gait Ambulation/Gait assistance: Supervision Gait Distance (Feet): 50 Feet Assistive device: IV Pole Gait Pattern/deviations: Wide base of support;Step-through pattern     General Gait Details: no overt LOB or difficulties ambulating pushing IV pole to bathroom   Stairs             Wheelchair Mobility    Modified Rankin (Stroke Patients Only)       Balance Overall balance assessment: Needs assistance Sitting-balance support: Feet supported;No upper extremity supported Sitting balance-Leahy Scale: Good Sitting balance - Comments: Pt able to maintain balance sitting EOB   Standing balance support: Single extremity supported Standing balance-Leahy Scale: Good Standing balance comment: no overt LOB with or without UE support for static standing; single UE support on IV                            Cognition Arousal/Alertness: Awake/alert Behavior During Therapy: WFL for tasks assessed/performed Overall Cognitive Status: Within Functional Limits for tasks assessed  Exercises General Exercises - Lower Extremity Hip ABduction/ADduction: AROM;Both;10 reps;Supine Straight Leg Raises:  AROM;Both;10 reps;Supine    General Comments        Pertinent Vitals/Pain Pain Assessment: 0-10 Pain Score: 7  Pain Location: generalized abdominal region Pain Descriptors / Indicators: Discomfort;Aching Pain Intervention(s): Limited activity within patient's tolerance;Monitored during session    Home Living                      Prior Function            PT Goals (current goals can now be found in the care plan section) Acute Rehab PT Goals Patient Stated Goal: to go home PT Goal Formulation: With patient Time For Goal Achievement: 10/31/20 Potential to Achieve Goals: Good Progress towards PT goals: Progressing toward goals    Frequency    Min 2X/week      PT Plan Current plan remains appropriate    Co-evaluation              AM-PAC PT "6 Clicks" Mobility   Outcome Measure  Help needed turning from your back to your side while in a flat bed without using bedrails?: A Lot Help needed moving from lying on your back to sitting on the side of a flat bed without using bedrails?: A Lot Help needed moving to and from a bed to a chair (including a wheelchair)?: A Little Help needed standing up from a chair using your arms (e.g., wheelchair or bedside chair)?: None Help needed to walk in hospital room?: A Little Help needed climbing 3-5 steps with a railing? : A Lot 6 Click Score: 16    End of Session   Activity Tolerance: Patient limited by pain Patient left: in bed;with call bell/phone within reach;with nursing/sitter in room Nurse Communication: Mobility status PT Visit Diagnosis: Unsteadiness on feet (R26.81);Muscle weakness (generalized) (M62.81);Pain;Other abnormalities of gait and mobility (R26.89) Pain - part of body:  (Abdomen)     Time: 9030-0923 PT Time Calculation (min) (ACUTE ONLY): 38 min  Charges:                        Katherine Basset, SPT   Baker Pierini 10/22/2020, 4:04 PM

## 2020-10-22 NOTE — Consult Note (Signed)
WOC Nurse ostomy follow up Stoma type/location: RLQ, end colostomy  Stomal assessment/size: no visible stoma Peristomal assessment: NA Treatment options for stomal/peristomal skin: 2" barrier ring Output mushy brown, flatus  Ostomy pouching: 1pc flex convex 2 1/8" .  Education provided:  Discussed pouch change, educational materials provided again. Wife can not come to bedside for any additional training. Possible to FT with with tomorrow  Enrolled patient in [facility] Discharge program: Yes  8 flex one piece and 10 barrier ring in the room.  Patient having issues with financial concerns.  Plans for [facility] but not sure his insurance will cover pouches without copay and patient was not able to afford needed DME because of copay.    Will plan pouch change with patient tomorrow, pouch intact and with complexity of this stoma and financial concerns it will be a good idea to maximize wear time as long as patient is not reporting in skin irritation. Patient self reports he will be DC home Friday possibly.   Edd Reppert [facility], CNS, [employer] [phone]

## 2020-10-22 NOTE — TOC Progression Note (Addendum)
Transition of Care [facility]) - Progression Note    Patient Details  Name: Jordan [name] MRN: [id number] Date of Birth: 28-Feb-1981  Transition of Care [facility]) CM/SW [place], LCSW Phone Number: 10/22/2020, 9:43 AM  Clinical Narrative:  CSW met with patient. No supports at bedside. CSW introduced role and explained that therapy recommendations would be discussed. Patient agreeable to home health. Provided CMS scores for agencies that serve his zip code. [facility] is first preference. He has no other preferences if they cannot accept. He just wants to ensure he will not have a copay due to limited income. Made referral to [facility] representative for PT, OT, and RN. Made him aware of wound care needs and that patient has a new colostomy. [facility] will review referral and follow up with decision. Patient agreeable to OT recommendation for a 3-in-1 and adaptive equipment. Per OT, this would include a sock aide, reacher, long-handled sponge, and long-handled shoe horn. Sent message to [place] representative to see if they carry these things. Patient reports he already has a walker and cane at home. No further concerns. CSW encouraged patient to contact CSW as needed. CSW will continue to follow patient for support and facilitate return home when stable.  1:26 pm: [facility] is unable to accept referral due to staffing. Made referral to [facility] and they are able to accept. He will not have a copay. Patient is aware and agreeable. Plan for weekend start of care if he discharges Friday. Notified patient of cost for the adaptive equipment ($40.76 plus tax) but he said he cannot afford it.  Expected Discharge Plan: [place] Barriers to Discharge: Continued Medical Work up  Expected Discharge Plan and Services Expected Discharge Plan: [place]   Discharge Planning Services: CM Consult Post Acute Care Choice: [place]  arrangements for the past 2 months: Single Family Home                                       Social Determinants of Health (SDOH) Interventions    Readmission Risk Interventions No flowsheet data found.

## 2020-10-22 NOTE — Progress Notes (Signed)
[facility] SURGICAL PROGRESS NOTE  Hospital Day(s): 21.   Post op day(s): 7 Days Post-Op.   Interval History:  Patient seen and examined no acute events or new complaints overnight.  Patient reports he is doing well, he is anxious to get out of the hospital No fever, chills, nausea, emesis Leukocytosis continues to improve, down to 11.0K Renal function remains normal, sCr - 0.68 He continues to diurese; UO - 5.4L No electrolyte derangements   Surgical drain with only 10 ccs in last 24 hours; serosanguinous Weaned off TPN last night Continues on regular diet and tolerating this well Continued colostomy function, this is not being recorded Working with therapies; progressing well; now recommending HH PT/OT Continues on Zosyn/Vancomycin; Day 7/10 of this regimen  Vital signs in last 24 hours: [min-max] current  Temp:  [97.9 F (36.6 C)-98 F (36.7 C)] 98 F (36.7 C) (10/27 0550) Pulse Rate:  [89-106] 93 (10/27 0550) Resp:  [18] 18 (10/27 0550) BP: (136-154)/(76-101) 145/76 (10/27 0550) SpO2:  [98 %-100 %] 98 % (10/27 0550)     Height: 5\' 6"  (167.6 cm) Weight: 113.7 kg BMI (Calculated): 40.48   Intake/Output last 2 shifts:  10/26 0701 - 10/27 0700 In: 4596.4 [P.O.:2120; I.V.:622.8; IV Piggyback:1853.6] Out: 5460 [Urine:5450; Drains:10]   Physical Exam:  Constitutional: alert, cooperative and no distress  Respiratory: breathing non-labored at rest  Cardiovascular: regular rate and sinus rhythm  Gastrointestinal:Soft,diffuse soreness, no appreciable distension, no rebound/guarding. Colostomy in left mid-abdomen which is flush to the skin, small amount of loose brown stool in bag,active flatus during examination. JP in RLQ with serosanguinous output, he is leaking around previous drain site in LLQ Integumentary:Laparoscopic incisions now with skin staples and honeycomb, no appreciable erythema Musculoskeletal:1+ pittingedema  Labs:  CBC Latest Ref Rng  & Units 10/22/2020 10/21/2020 10/20/2020  WBC 4.0 - 10.5 K/uL 11.0(H) 12.1(H) 14.0(H)  Hemoglobin 13.0 - 17.0 g/dL 10/22/2020) 7.8(I) 6.9(G)  Hematocrit 39 - 52 % 26.9(L) 29.5(L) 30.5(L)  Platelets 150 - 400 K/uL 353 376 386   CMP Latest Ref Rng & Units 10/22/2020 10/21/2020 10/20/2020  Glucose 70 - 99 mg/dL 10/22/2020) 284(X) 324(M)  BUN 6 - 20 mg/dL 9 9 10   Creatinine 0.61 - 1.24 mg/dL 010(U 7.25  Sodium 135 - 145 mmol/L 137 135 138  Potassium 3.5 - 5.1 mmol/L 4.2 4.2 4.3  Chloride 98 - 111 mmol/L 101 99 102  CO2 22 - 32 mmol/L 28 28 27   Calcium 8.9 - 10.3 mg/dL 8.3(L) 8.1(L) 8.3(L)  Total Protein 6.5 - 8.1 g/dL - - 6.4(L)  Total Bilirubin 0.3 - 1.2 mg/dL - - 0.5  Alkaline Phos 38 - 126 U/L - - 108  AST 15 - 41 U/L - - 18  ALT 0 - 44 U/L - - 20    Imaging studies: No new pertinent imaging studies   Assessment/Plan:  39 y.o. male progressing well, improving leukocytosis, 7 Days Post-Op s/p robotic assisted laparoscopic Hartman's Procedurefor anastomotic dehiscence which failed attempt at conservative management following initialrobotic assisted sigmoid colectomyon 10/06.   - I will proactively plan for repeat CT Abdomen/Pelvis tomorrow to ensure no intra-abdominal process prior to discontinuing ABx and for discharge planning.    - Continue regulardiet + nutritional supplementation   - Continue IV Abx (Zosyn + Vancomycin); Day 7/10 of this regimen --> If remains afebrile and leukocytosis resolves I am okay with stopping these at discharge.  - Monitor abdominal examination; colostomy function - Pain control prn;Antiemetics prn -  Monitor leukocytosis --> Improving, near resolution   - Continue PO lasix 20 mg for edema; he understands he also needs to ambulate to mobilize fluid - Continue current surgical drain; monitor and record output - Pain control prn; antiemetics prn - Mobilization encouraged; continue with  therapies; recommending HH PT/OT   - appreciate WOC RN assistance with ostomy teaching / care               - Discharge Planning: Ensure leukocytosis resolves + repeat imaging, establish home health, my tentative goal is for Friday 10/29  All of the above findings and recommendations were discussed with the patient, and the medical team, and all of patient's questions were answered to his expressed satisfaction.  -- Lynden Oxford, PA-C [facility] 10/22/2020, 7:39 AM [phone] M-F: 7am - 4pm

## 2020-10-23 ENCOUNTER — Inpatient Hospital Stay: Payer: Medicare HMO

## 2020-10-23 ENCOUNTER — Encounter: Payer: Self-pay | Admitting: Surgery

## 2020-10-23 LAB — CBC
HCT: 28.2 % — ABNORMAL LOW (ref 39.0–52.0)
Hemoglobin: 8.8 g/dL — ABNORMAL LOW (ref 13.0–17.0)
MCH: 25.5 pg — ABNORMAL LOW (ref 26.0–34.0)
MCHC: 31.2 g/dL (ref 30.0–36.0)
MCV: 81.7 fL (ref 80.0–100.0)
Platelets: 365 10*3/uL (ref 150–400)
RBC: 3.45 MIL/uL — ABNORMAL LOW (ref 4.22–5.81)
RDW: 15 % (ref 11.5–15.5)
WBC: 9.4 10*3/uL (ref 4.0–10.5)
nRBC: 0 % (ref 0.0–0.2)

## 2020-10-23 MED ORDER — IOHEXOL 300 MG/ML  SOLN
125.0000 mL | Freq: Once | INTRAMUSCULAR | Status: AC | PRN
  Administered 2020-10-23: 125 mL via INTRAVENOUS

## 2020-10-23 MED ORDER — IOHEXOL 9 MG/ML PO SOLN
500.0000 mL | ORAL | Status: AC
  Administered 2020-10-23 (×2): 500 mL via ORAL

## 2020-10-23 NOTE — Care Management Important Message (Signed)
Important Message  Patient Details  Name: Jordan [name] MRN: [id number] Date of Birth: May 09, 1981   Medicare Important Message Given:  Yes     [name] 10/23/2020, 1:27 PM

## 2020-10-23 NOTE — Progress Notes (Signed)
[facility] SUR[facility]ICAL PROGRESS NOTE  Hospital Day(s): 22.   Post op day(s): 8 Days Post-Op.   Interval History:  Patient seen and examined No acute events or new complaints overnight.  Patient reports he is doing well, anxious to go home Leukocytosis now resolved, WBC 9.4K this morning He continues to diurese; UO - 4.4L Surgical drain with only 30 ccs in last 24 hours; serosanguinous Continues on regular diet and tolerating this well Continued colostomy function, this is not being recorded Working with therapies; progressing well; now recommending HH PT/OT Continues on Zosyn/Vancomycin; Day 8/10 of this regimen --> will likely DC this at discharge Pending CT Abdomen/Pelvis this morning.   Vital signs in last 24 hours: [min-max] current  Temp:  [98.2 F (36.8 C)-99.3 F (37.4 C)] 98.7 F (37.1 C) (10/28 0732) Pulse Rate:  [92-115] 93 (10/28 0732) Resp:  [16-18] 18 (10/28 0459) BP: (124-148)/(75-97) 143/90 (10/28 0732) SpO2:  [97 %-100 %] 100 % (10/28 0732)     Height: 5\' 6"  (167.6 cm) Weight: 113.7 kg BMI (Calculated): 40.48   Intake/Output last 2 shifts:  10/27 0701 - 10/28 0700 In: 2705.6 [P.O.:1557; I.V.:43.1; IV Piggyback:1105.5] Out: 4530 [Urine:4450; Drains:30; Stool:50]   Physical Exam:  Constitutional: alert, cooperative and no distress  Respiratory: breathing non-labored at rest  Cardiovascular: regular rate and sinus rhythm  Gastrointestinal:Soft,diffuse soreness, no appreciable distension, no rebound/guarding. Colostomy in left mid-abdomen which is flush to the skin, small amount of loose brown stool in bag,active flatus during examination. JP in RLQ with serosanguinous output, he is leaking around previous drain site in LLQ Integumentary:Laparoscopic incisions now with skin staples and honeycomb, no appreciable erythema Musculoskeletal:1+ pittingedema  Labs:  CBC Latest Ref Rng & Units 10/23/2020 10/22/2020 10/21/2020  WBC 4.0 - 10.5 K/uL  9.4 11.0(H) 12.1(H)  Hemoglobin 13.0 - 17.0 g/dL 10/23/2020) 2.6(R) 4.8(N)  Hematocrit 39 - 52 % 28.2(L) 26.9(L) 29.5(L)  Platelets 150 - 400 K/uL 365 353 376   CMP Latest Ref Rng & Units 10/22/2020 10/21/2020 10/20/2020  Glucose 70 - 99 mg/dL 10/22/2020) 703(J) 009(F)  BUN 6 - 20 mg/dL 9 9 10   Creatinine 0.61 - 1.24 mg/dL 818(E 9.93  Sodium 135 - 145 mmol/L 137 135 138  Potassium 3.5 - 5.1 mmol/L 4.2 4.2 4.3  Chloride 98 - 111 mmol/L 101 99 102  CO2 22 - 32 mmol/L 28 28 27   Calcium 8.9 - 10.3 mg/dL 8.3(L) 8.1(L) 8.3(L)  Total Protein 6.5 - 8.1 g/dL - - 6.4(L)  Total Bilirubin 0.3 - 1.2 mg/dL - - 0.5  Alkaline Phos 38 - 126 U/L - - 108  AST 15 - 41 U/L - - 18  ALT 0 - 44 U/L - - 20     Imaging studies:   CT Abdomen/Pelvis (10/23/2020 personally reviewed showing fluid collection lateral to colostomy, fluid collection in pelvis with surgical drain present in this, otherwise diffuse edema but no complications apparent, and radiologist report reviewed below:  IMPRESSION: 1. Status post interval Hartmann procedure with left lower quadrant end colostomy. 2. There is a fluid collection in the anterior left hemiabdomen adjacent to the distal colon and ostomy measuring 6.2 x 4.3 cm, suspicious for abscess. 3. There is an additional air and fluid collection abutting the rectal stump in the posterior pelvis measuring 6.1 x 3.4 cm; the tip of a surgical drain is positioned within this collection.   Assessment/Plan:  39 y.o. male with now resolved leukocytosis, doing well, 8 Days Post-Op s/p robotic assisted  laparoscopic Hartman's Procedurefor anastomotic dehiscence which failed attempt at conservative management following initialrobotic assisted sigmoid colectomyon 10/06    - Discussed CT findings with Dr Fredia Sorrow (IR); will plan on aspiration of fluid collection lateral to colostomy; greatly appreciate their help  - NPO for now pending IR procedure; Continueregulardiet after + nutritional  supplementation              - Continue IV Abx (Zosyn + Vancomycin); Day 8/10 of this regimen --> He has remained afebrile and leukocytosis resolved, I will plan on stopping these at discharge   - Monitor abdominal examination; colostomy function - Pain control prn;Antiemetics prn - Monitor leukocytosis --> RESOLVED             - Continue PO lasix 20 mg for edema; he understands he also needs to ambulate to mobilize fluid - Continue current surgical drain; monitor and record output  - Plan to remove staples at discharge - Pain control prn; antiemetics prn - Mobilization encouraged; continue with therapies; recommending HH PT/OT              - appreciate WOC RN assistance with ostomy teaching / care  - Discharge Planning: Pending possible aspiration with IR, I believe he should be ready for discharge tomorrow 10/29  All of the above findings and recommendations were discussed with the patient, and the medical team, and all of patient's questions were answered to his expressed satisfaction.  -- Lynden Oxford, PA-C [facility] 10/23/2020, 8:01 AM [phone] M-F: 7am - 4pm

## 2020-10-23 NOTE — TOC Progression Note (Signed)
Transition of Care [facility]) - Progression Note    Patient Details  Name: English Craighead MRN: [id number] Date of Birth: 1981-05-20  [facility] CM/SW Contact  Margarito Liner, LCSW Phone Number: 10/23/2020, 9:53 AM  Clinical Narrative: Called [employer] representative to order bariatric 3-in-1 in preparation for discharge tomorrow.    Expected Discharge Plan: Home w Home Health Services Barriers to Discharge: Continued Medical Work up  Expected Discharge Plan and Services Expected Discharge Plan: Home w Home Health Services   Discharge Planning Services: CM Consult Post Acute Care Choice: Home Health Living arrangements for the past 2 months: Single Family Home                                       Social Determinants of Health (SDOH) Interventions    Readmission Risk Interventions No flowsheet data found.

## 2020-10-23 NOTE — Consult Note (Signed)
[facility] Nurse ostomy follow up Stoma type/location:  LLQ, end colostomy Stomal assessment/size: no visible stoma; opening in the skin is 1.2cm x 3.5cm with brown mucosa in the base that is sloughing minimally Peristomal assessment: intact  Treatment options for stomal/peristomal skin: 2" skin barrier ring Output liquid; bloody  Ostomy pouching: 1pc.flex convex with 2" barrier ring  Education provided: met wit patient and his wife today at the bedside for teaching.   Discussed again stoma characteristics (budded, flush, color, texture, care). Patients stoma is retracted; wife is concerned because her grandmother's stoma was budded. Discussed potential difficulties with pouching and my extreme pleasure that we have used has been working quite well pouch change  Patient drew pattern and cut new skin barrier Patient cleaned with minimal assistance cleaning peristomal skin and stoma Patient placed  2" barrier ring with assistance from wife (due to body habitus patient has difficultly seeing opening  Education on emptying when 1/3 to 1/2 full and how to empty; reminding that folding pouch up may increase likelihood of overfilling  Demonstrated "burping" flatus from pouch Patient is independent in the use of wick to clean spout  Wife place new pouch and together they removed tape border coverings  Demonstrated and allowed wife to instill [employer] lubrication/deoderizing  drops  Demonstrated use of ostomy belt; one in the room for patient to take home; may need to add once patient more mobile.  Answered patient/family questions:   1. Warming the skin barrier 2. Other manufacturers (wife has been online and feels that he may need [employer] deep convexity) I have tried to explain that what we have is flex convexity and is working but she can contact [employer] directly for samples should they choose to after DC.  3. Cost of pouches and supplies 4. Wife wanted to discuss use of ostomy belt which was really  hernia belt; discussed measuring needs for order this after DC 5. Provided educational materials again to wife; 1pc pouching and barrier ring handouts and explained use of QR codes for more education that is a reliable source. Cautioned patient and wife on use of [employer] and other sites for consistent reliable information.    Planning for DC; 7 flex convex and 7 barrier rings, [employer] lubrication drop samples, and belt in the room. Wife has taken one box of pouches in her bag. I have asked her to leave until he is DC.  [place], [place], CWON-AP [phone]        Enrolled patient in [place] Start Discharge program: Yes

## 2020-10-24 ENCOUNTER — Inpatient Hospital Stay: Payer: Medicare HMO

## 2020-10-24 LAB — CREATININE, SERUM
Creatinine, Ser: 0.82 mg/dL (ref 0.61–1.24)
GFR, Estimated: >60 mL/min (ref >60)

## 2020-10-24 MED ORDER — ONDANSETRON 4 MG PO TBDP: 4.0000 mg | ORAL_TABLET | Freq: Four times a day (QID) | ORAL | 0 refills | Status: DC | PRN

## 2020-10-24 MED ORDER — FENTANYL CITRATE (PF) 100 MCG/2ML IJ SOLN
INTRAMUSCULAR | Status: AC
  Filled 2020-10-24: qty 2

## 2020-10-24 MED ORDER — FUROSEMIDE 20 MG PO TABS: 20.0000 mg | ORAL_TABLET | Freq: Every day | ORAL | 0 refills | Status: DC

## 2020-10-24 MED ORDER — MIDAZOLAM HCL 5 MG/5ML IJ SOLN
INTRAMUSCULAR | Status: AC | PRN
  Administered 2020-10-24: 1 mg via INTRAVENOUS

## 2020-10-24 MED ORDER — GABAPENTIN 300 MG PO CAPS: 300.0000 mg | ORAL_CAPSULE | Freq: Three times a day (TID) | ORAL | 0 refills | Status: DC

## 2020-10-24 MED ORDER — METHOCARBAMOL 500 MG PO TABS
500.0000 mg | ORAL_TABLET | Freq: Three times a day (TID) | ORAL | 0 refills | Status: DC | PRN
Start: 2020-10-24 — End: 2020-11-11

## 2020-10-24 MED ORDER — OXYCODONE HCL 10 MG PO TABS: 10.0000 mg | ORAL_TABLET | Freq: Four times a day (QID) | ORAL | 0 refills | Status: DC | PRN

## 2020-10-24 MED ORDER — MIDAZOLAM HCL 5 MG/5ML IJ SOLN
INTRAMUSCULAR | Status: AC
  Filled 2020-10-24: qty 5

## 2020-10-24 NOTE — H&P (Signed)
Chief Complaint:  Postop abdominal abscess  Referring Physician(s):  Rodenberg   History of Present Illness: Jordan [name] is a 39 y.o. male postop abdominal fluid collections.  New fluid collection in the left anterior abdomen adjacent to the colostomy site.  Plan for CT needle aspiration only today.  Past Medical History:  Diagnosis Date  . Anxiety and depression   . Asthma   . Chronic pain syndrome   . History of kidney stones   . HYPERTENSION, MILD   . OSTEOARTHRITIS   . Recurrent UTI   . Sleep apnea     Past Surgical History:  Procedure Laterality Date  . COLONOSCOPY WITH PROPOFOL N/A 09/30/2020   Procedure: COLONOSCOPY WITH PROPOFOL;  Surgeon: Midge MiniumWohl, Darren, MD;  Location: [facility] ENDOSCOPY;  Service: Endoscopy;  Laterality: N/A;  . JOINT REPLACEMENT    . KNEE SURGERY Right    7 total surgeries  . LYSIS OF ADHESION  10/15/2020   Procedure: LYSIS OF ADHESION;  Surgeon: Campbell Lernerodenberg, Denny, MD;  Location: [facility] ORS;  Service: General;;  . REPLACEMENT TOTAL KNEE Right   . XI ROBOT ASSISTED DIAGNOSTIC LAPAROSCOPY N/A 10/15/2020   Procedure: XI ROBOT ASSISTED DIAGNOSTIC LAPAROSCOPY, HARTMAN'S;  Surgeon: Campbell Lernerodenberg, Denny, MD;  Location: [facility] ORS;  Service: General;  Laterality: N/A;    Allergies: Butrans [buprenorphine hcl], Nucynta [tapentadol hydrochloride], Tapentadol hcl, Glucose polymer, Lactose intolerance (gi), Sumac, and Bactrim [sulfamethoxazole-trimethoprim]  Medications: Prior to Admission medications   Medication Sig Start Date End Date Taking? Authorizing Provider  albuterol (VENTOLIN HFA) 108 (90 Base) MCG/ACT inhaler Inhale 2 puffs into the lungs every 6 (six) hours as needed for wheezing or shortness of breath. 06/27/19  Yes Sharlene DoryWendling, Nicholas Paul, DO  aspirin 81 MG tablet Take 81 mg by mouth daily.    Yes [provider]  cetirizine (ZYRTEC) 10 MG tablet Take 1 tablet (10 mg total) by mouth daily. 07/02/20  Yes Abonza, Maritza, PA-C    phenazopyridine (PYRIDIUM) 95 MG tablet Take 95 mg by mouth in the morning and at bedtime.    Yes [provider]  ciprofloxacin (CIPRO) 500 MG tablet Take 1 tablet (500 mg total) by mouth every 12 (twelve) hours. Patient not taking: Reported on 09/30/2020 09/12/20   Harle BattiestMcGowan, Shannon A, PA-C     Family History  Problem Relation Age of Onset  . Heart disease Father   . Heart attack Father   . Diabetes Father   . High Cholesterol Father   . High blood pressure Father   . Heart disease Paternal Grandfather   . High Cholesterol Paternal Grandfather   . Heart disease Paternal Grandmother   . Heart attack Paternal Grandmother   . Diabetes Paternal Grandmother   . Heart disease Paternal Uncle   . Cancer Paternal Uncle   . Heart attack Paternal Uncle   . Diabetes Paternal Uncle   . High Cholesterol Paternal Uncle   . Diabetes Paternal Aunt   . Hypertension Maternal Uncle   . Cancer Maternal Aunt        lung  . Cancer Maternal Uncle        lung  . Depression Mother   . Dementia Mother     Social History   Socioeconomic History  . Marital status: Married    Spouse name: Designer, television/film setMelissa Gallogly  . Number of children: 2  . Years of education: some college  . Highest education level: Not on file  Occupational History    Employer: UNEMPLOYED  Comment: Disabled  Tobacco Use  . Smoking status: Former Smoker    Years: 2.00    Types: Cigars    Quit date: 01/18/2014    Years since quitting: 6.7  . Smokeless tobacco: Never Used  Vaping Use  . Vaping Use: Never used  Substance and Sexual Activity  . Alcohol use: Yes    Alcohol/week: 13.0 standard drinks    Types: 7 Standard drinks or equivalent, 6 Cans of beer per week  . Drug use: Yes    Frequency: 7.0 times per week    Types: Marijuana    Comment: 09/28/20   . Sexual activity: Yes  Other Topics Concern  . Not on file  Social History Narrative   Caffeine Use:  6 beverages daily   Regular exercise:  2 x weekly          Social Determinants of Health   Financial Resource Strain:   . Difficulty of Paying Living Expenses: Not on file  Food Insecurity:   . Worried About Programme researcher, broadcasting/film/video in the Last Year: Not on file  . Ran Out of Food in the Last Year: Not on file  Transportation Needs:   . Lack of Transportation (Medical): Not on file  . Lack of Transportation (Non-Medical): Not on file  Physical Activity:   . Days of Exercise per Week: Not on file  . Minutes of Exercise per Session: Not on file  Stress:   . Feeling of Stress : Not on file  Social Connections:   . Frequency of Communication with Friends and Family: Not on file  . Frequency of Social Gatherings with Friends and Family: Not on file  . Attends Religious Services: Not on file  . Active Member of Clubs or Organizations: Not on file  . Attends Banker Meetings: Not on file  . Marital Status: Not on file    Review of Systems: A 12 point ROS discussed and pertinent positives are indicated in the HPI above.  All other systems are negative.  Review of Systems  Vital Signs: BP 116/68   Pulse 90   Temp 98.2 F (36.8 C) (Oral)   Resp 20   Ht 5\' 6"  (1.676 m)   Wt (!) 139.7 kg   SpO2 95%   BMI 49.71 kg/m   Physical Exam Constitutional:      General: He is not in acute distress.    Appearance: He is not toxic-appearing.  Eyes:     General: No scleral icterus.    Conjunctiva/sclera: Conjunctivae normal.  Cardiovascular:     Rate and Rhythm: Normal rate and regular rhythm.  Pulmonary:     Effort: Pulmonary effort is normal.     Breath sounds: Normal breath sounds.  Abdominal:     General: There is distension.     Tenderness: There is abdominal tenderness.  Neurological:     General: No focal deficit present.     Mental Status: Mental status is at baseline.  Psychiatric:        Mood and Affect: Mood normal.     Imaging: DG Abd 1 View  Result Date: 10/03/2020 CLINICAL DATA:  Ileus, abdominal pain,  distension, recent sigmoid colectomy EXAM: ABDOMEN - 1 VIEW COMPARISON:  None. FINDINGS: Diffusely gas-filled and distended small bowel, largest loops measuring up to 5.0 cm. Scattered gas present in the proximal colon. Minimal pneumoperitoneum on supine radiographs, in keeping with recent abdominal surgery. IMPRESSION: Diffusely gas-filled and distended small bowel, largest loops measuring  up to 5.0 cm, most likely ileus in the postoperative setting. Electronically Signed   By: Lauralyn Primes M.D.   On: 10/03/2020 09:10   CT ABDOMEN PELVIS W CONTRAST  Result Date: 10/23/2020 CLINICAL DATA:  Anastomotic leak, status post Hartmann procedure, evaluate for residual intra-abdominal abscess or fluid collection EXAM: CT ABDOMEN AND PELVIS WITH CONTRAST TECHNIQUE: Multidetector CT imaging of the abdomen and pelvis was performed using the standard protocol following bolus administration of intravenous contrast. CONTRAST:  OMNIPAQUE IOHEXOL 300 MG/ML SOLN, additional oral enteric contrast COMPARISON:  10/15/2020 FINDINGS: Lower chest: No acute abnormality. Hepatobiliary: No solid liver abnormality is seen. No gallstones, gallbladder wall thickening, or biliary dilatation. Pancreas: Unremarkable. No pancreatic ductal dilatation or surrounding inflammatory changes. Spleen: Normal in size without significant abnormality. Adrenals/Urinary Tract: Adrenal glands are unremarkable. Kidneys are normal, without renal calculi, solid lesion, or hydronephrosis. Bladder is unremarkable. Stomach/Bowel: Stomach is within normal limits. Appendix appears normal. Status post interval Hartmann procedure with left lower quadrant end colostomy. There is a fluid collection in the anterior left hemiabdomen adjacent to the distal colon and ostomy measuring 6.2 x 4.3 cm (series 3, image 52). There is an additional air and fluid collection abutting the rectal stump in the posterior pelvis measuring 6.1 x 3.4 cm; the tip of a surgical drain is  positioned within this collection (series 3, image 75). Vascular/Lymphatic: No significant vascular findings are present. No enlarged abdominal or pelvic lymph nodes. Reproductive: No mass or other significant abnormality. Other: Anasarca.  No abdominopelvic ascites. Musculoskeletal: No acute or significant osseous findings. IMPRESSION: 1. Status post interval Hartmann procedure with left lower quadrant end colostomy. 2. There is a fluid collection in the anterior left hemiabdomen adjacent to the distal colon and ostomy measuring 6.2 x 4.3 cm, suspicious for abscess. 3. There is an additional air and fluid collection abutting the rectal stump in the posterior pelvis measuring 6.1 x 3.4 cm; the tip of a surgical drain is positioned within this collection. Electronically Signed   By: Lauralyn Primes M.D.   On: 10/23/2020 09:33   CT ABDOMEN PELVIS W CONTRAST  Addendum Date: 10/15/2020   ADDENDUM REPORT: 10/15/2020 12:06 ADDENDUM: These results were called by telephone at the time of interpretation on 10/15/2020 at 12:06 pm to provider [facility] , who verbally acknowledged these results. Electronically Signed   By: Donzetta Kohut M.D.   On: 10/15/2020 12:06   Result Date: 10/15/2020 CLINICAL DATA:  Abscess, infection suspected status post sigmoid colectomy with history of known anastomotic leak EXAM: CT ABDOMEN AND PELVIS WITH CONTRAST TECHNIQUE: Multidetector CT imaging of the abdomen and pelvis was performed using the standard protocol following bolus administration of intravenous contrast. CONTRAST:  OMNIPAQUE IOHEXOL 300 MG/ML  SOLN COMPARISON:  Ten 12 in 10/09/2020 FINDINGS: Lower chest: Incidental imaging of the lung bases is unremarkable. Hepatobiliary: Liver without focal lesion. Homogeneous enhancement. Suggestion of background hepatic steatosis. No pericholecystic stranding. No biliary duct dilation. Pancreas: Pancreatic atrophy mainly in the head of the pancreas. No ductal dilation or signs of  inflammation. Spleen: Spleen normal in size and contour. Adrenals/Urinary Tract: Adrenal glands are normal. Symmetric renal enhancement. No hydronephrosis. Urinary bladder with some perivesical stranding in the setting of diffuse inflammation in the pelvis. Stomach/Bowel: Alternating areas of bowel dilation and collapse. Some signs of enteritis particularly about pelvic bowel loops and bowel in the central abdomen with perienteric stranding. Positive contrast media was administered and passes into mid and distal small bowel. Increasing distension  of mid small bowel loops up to 4.5 cm in the LEFT upper quadrant. No definitive signs of extraluminal contrast however, the contrast does not reach the level of the anastomotic site where there is a leak. A pigtail drainage catheter is in place in the LEFT hemiabdomen at the site of previously identified fluid collection in this location adjacent to bowel loops. Presence of contrast in the colon. This does not reach the site of the anastomosis. There is persistent fluid and gas about the anastomotic site in the upper pelvis compatible with colorectal anastomotic changes. Gas containing tract is seen on images 78 through 75 extending anteriorly as on the previous exam. The fluid and gas containing collection associated with the LEFT lower quadrant has decompressed. There is however fluid layering dependently in gas in the upper abdomen and there is large volume pneumoperitoneum that persists despite drainage. Vascular/Lymphatic: normal caliber abdominal aorta. There is no gastrohepatic or hepatoduodenal ligament lymphadenopathy. No retroperitoneal or mesenteric lymphadenopathy. Patent SMV. Scattered lymph nodes in the pelvis mildly enlarged, presumably reactive in the setting of diffuse inflammation in the pelvis. Reproductive: Prostate unremarkable by CT. Other: Diffuse body wall edema similar to the prior exam. Signs of previous laparoscopic access in the RIGHT lower  quadrant and LEFT upper quadrant. Percutaneous drainage catheter in place. Musculoskeletal: No acute musculoskeletal process. IMPRESSION: 1. Interval placement of a LEFT lower quadrant drain with decompression of some of the areas of fluid and gas in the pelvis but with persistent large volume pneumoperitoneum and layering of fluid within this pneumoperitoneum in the mid abdomen, best seen on image 56 of series 8. Process likely tracks along LEFT flank and through the central abdomen into the upper abdomen. Findings are compatible with diffuse dissemination of leaked bowel contents into the upper abdomen. 2. Worsening small bowel ileus and signs of enteritis likely related to peritoneal and mesenteric inflammation. 3. Diffuse body wall edema. 4. Pancreatic atrophy mainly in the head of the pancreas. 5. A call is out to the referring provider to further discuss findings in the above case. Electronically Signed: By: Donzetta Kohut M.D. On: 10/15/2020 12:02   CT ABDOMEN PELVIS W CONTRAST  Result Date: 10/10/2020 CLINICAL DATA:  Evaluate for abdominal abscess. Status post sigmoid colectomy in found to have an anastomotic leak. EXAM: CT ABDOMEN AND PELVIS WITH CONTRAST TECHNIQUE: Multidetector CT imaging of the abdomen and pelvis was performed using the standard protocol following bolus administration of intravenous contrast. CONTRAST:  OMNIPAQUE IOHEXOL 300 MG/ML  SOLN COMPARISON:  10/07/2020 FINDINGS: Lower chest: No acute abnormality. Hepatobiliary: No focal liver abnormality is seen. No gallstones, gallbladder wall thickening, or biliary dilatation. Pancreas: Unremarkable. No pancreatic ductal dilatation or surrounding inflammatory changes. Spleen: Normal in size without focal abnormality. Adrenals/Urinary Tract: Normal appearance of the adrenal glands. No kidney mass or hydronephrosis identified. Urinary bladder is unremarkable. Stomach/Bowel: Stomach appears nondistended. Multiple dilated loops of small  bowel with air-fluid levels. There is enteric contrast material within the small and large bowel loops. Rectosigmoid anastomosis is noted within the central pelvis, image 80/2. Again seen are signs of anastomotic dehiscence extraluminal gas adjacent to the suture line. Fluid in gas can be seen tracking from the dehiscence site superiorly into the lower abdomen, image 73/2. The volume of extraluminal gas anastomosis appears decreased when compared with 10/07/2020. The previously identified collection of extraluminal gas and fluid within the lower abdomen is again noted and appears decreased in volume from previous exam the largest collection is in the  left lower quadrant measuring 4.3 x 4.9 cm. Vascular/Lymphatic: No significant vascular findings are present. No enlarged abdominal or pelvic lymph nodes. Prominent retroperitoneal and pelvic lymph nodes are favored to reflect reactive adenopathy. Reproductive: Prostate is unremarkable. Other: Small volume of free fluid noted within the pelvis and upper abdomen. Pneumoperitoneum is identified. When compared with the previous exam this is increased in volume. Musculoskeletal: No acute or significant osseous findings. IMPRESSION: 1. Signs of persistent anastomotic dehiscence of status post sigmoid resection. Extraluminal collection of gas and fluid associated with dehiscence appears decreased in volume from previous exam. However, there is been an increase in volume of pneumoperitoneum. 2. Dilated loops of small bowel noted which are favored to represent ileus. Electronically Signed   By: Signa Kell M.D.   On: 10/10/2020 12:21   CT ABDOMEN PELVIS W CONTRAST  Result Date: 10/07/2020 CLINICAL DATA:  Status post sigmoid colectomy EXAM: CT ABDOMEN AND PELVIS WITH CONTRAST TECHNIQUE: Multidetector CT imaging of the abdomen and pelvis was performed using the standard protocol following bolus administration of intravenous contrast. CONTRAST:  OMNIPAQUE IOHEXOL 300  MG/ML  SOLN COMPARISON:  CT abdomen pelvis, 05/04/2020 FINDINGS: Lower chest: No acute abnormality. Hepatobiliary: No solid liver abnormality is seen. No gallstones, gallbladder wall thickening, or biliary dilatation. Pancreas: Unremarkable. No pancreatic ductal dilatation or surrounding inflammatory changes. Spleen: Normal in size without significant abnormality. Adrenals/Urinary Tract: Adrenal glands are unremarkable. Kidneys are normal, without renal calculi, solid lesion, or hydronephrosis. Bladder is unremarkable. Stomach/Bowel: Stomach is within normal limits. Postoperative findings of sigmoid colon resection and reanastomosis. There is a frank defect at the right aspect of the anastomosis with air and fluid tracking superiorly into the central small bowel mesentery (series 2, image 78, series 5, image 80). Although very elongated and irregular in shape, the largest, most central component of air and fluid in the central small bowel mesentery measures at least 13.3 x 4.8 cm (series 2, image 72). The small bowel is diffusely fluid-filled without a distinctly identified transition point and there is gas present throughout the colon to the rectum. There are thickened loops of small bowel adjacent to air and fluid within the mesentery (series 2, image 66). Diverticula of the remaining descending and sigmoid colon. Vascular/Lymphatic: No significant vascular findings are present. No enlarged abdominal or pelvic lymph nodes. Reproductive: No mass or other significant abnormality. Other: No abdominal wall hernia or abnormality. There is trace free fluid in the low abdomen and pelvis in addition to minimal pneumoperitoneum. There is subcutaneous emphysema about the anterior left hemiabdomen and a small volume of emphysema about the lower esophagus. Musculoskeletal: No acute or significant osseous findings. IMPRESSION: 1. Postoperative findings of sigmoid colon resection and reanastomosis. There is a frank defect at  the right aspect of the anastomosis with air and fluid tracking superiorly into the central small bowel mesentery. Although very elongated and irregular in shape, the largest, most central component of air and fluid in the central small bowel mesentery measures at least 13.3 x 4.8 cm. 2. The small bowel is diffusely fluid-filled without a distinctly identified transition point and there is gas present throughout the colon to the rectum. There are thickened loops of small bowel adjacent to air and fluid within the mesentery, likely reactive to adjacent infection and overall findings most consistent with ileus. 3. Trace free fluid in the low abdomen and pelvis in addition to minimal pneumoperitoneum. There is subcutaneous emphysema about the anterior left hemiabdomen and a small volume of emphysema  about the lower esophagus. Findings are generally in keeping with recent laparoscopy. These results will be called to the ordering clinician or representative by the Radiologist Assistant, and communication documented in the PACS or [employer]. Electronically Signed   By: Lauralyn Primes M.D.   On: 10/07/2020 11:50   DG Abd 2 Views  Result Date: 10/05/2020 CLINICAL DATA:  Postoperative ileus EXAM: ABDOMEN - 2 VIEW COMPARISON:  10/03/2020 FINDINGS: There is distention of multiple loops of small bowel measuring up to approximately 5.4 cm in diameter. There is no definite pneumatosis or free air. Gas is seen to the level of the patient's rectum. There is no definite radiopaque kidney stone. IMPRESSION: Findings are consistent with a postoperative ileus or small bowel obstruction. Electronically Signed   By: Katherine Mantle M.D.   On: 10/05/2020 20:51   CT IMAGE GUIDED DRAINAGE BY PERCUTANEOUS CATHETER  Result Date: 10/10/2020 INDICATION: 39 year old with with recent robotic assisted sigmoid colectomy for complicated diverticulitis and a colovesical fistula. Recent positive test for COVID-19. Postoperative  imaging has demonstrated a large amount of free air and gas at the surgical anastomosis. Imaging findings are suggestive for an anastomosis leak. Request for percutaneous drainage of the air-fluid collection in the left lower quadrant of the abdomen. EXAM: CT GUIDED DRAINAGE OF LEFT LOWER ABDOMINAL ABSCESS MEDICATIONS: Moderate sedation ANESTHESIA/SEDATION: 2.0 mg IV Versed 100 mcg IV Fentanyl Moderate Sedation Time:  26 minutes The patient was continuously monitored during the procedure by the interventional radiology nurse under my direct supervision. COMPLICATIONS: None immediate. TECHNIQUE: Informed written consent was obtained from the patient after a thorough discussion of the procedural risks, benefits and alternatives. All questions were addressed. Maximal Sterile Barrier Technique was utilized including caps, mask, sterile gowns, sterile gloves, sterile drape, hand hygiene and skin antiseptic. A timeout was performed prior to the initiation of the procedure. PROCEDURE: Patient was placed supine on the CT scanner. Images through the lower abdomen and pelvis were obtained. Large air-fluid collection in the left lower quadrant abdomen was identified and targeted. The left lower abdomen was prepped with chlorhexidine and sterile field was created. Skin and soft tissues were anesthetized using 1% lidocaine. A small incision was made. Using CT guidance, an 18 gauge trocar needle was directed into the large gas-filled collection. Small amount of cloudy yellow fluid was aspirated. Superstiff Amplatz wire was advanced into the gas-filled collection. Tract was dilated to accommodate a 12 [place] multipurpose drain. Approximately 5 mL of cloudy fluid was removed along with air. Catheter was attached to a gravity bag and sutured in place. FINDINGS: Complex gas-filled collections in the lower abdomen and pelvis. The large collection in the left lower quadrant was targeted. 12 French drain was placed within the collection  and the drain extends towards the midline. Gas and a small amount of cloudy yellow fluid was removed. Again noted is gas and fluid near the anastomosis in the pelvis. Findings are compatible with an anastomotic leak. IMPRESSION: Successful placement of a CT-guided drain in the left lower quadrant abdominal abscess. Small amount of fluid was obtained. Fluid was sent for culture. Electronically Signed   By: Richarda Overlie M.D.   On: 10/10/2020 16:23   [place] EKG SITE RITE  Result Date: 10/09/2020 If Site Rite image not attached, placement could not be confirmed due to current cardiac rhythm.   Labs:  CBC: Recent Labs    10/20/20 0536 10/21/20 0805 10/22/20 0509 10/23/20 0558  WBC 14.0* 12.1* 11.0* 9.4  HGB 9.6* 9.3*  8.6* 8.8*  HCT 30.5* 29.5* 26.9* 28.2*  PLT 386 376 353 365    COAGS: No results for input(s): INR, APTT in the last 8760 hours.  BMP: Recent Labs    05/04/20 1513 05/04/20 1513 09/29/20 0901 10/01/20 0728 10/19/20 0520 10/19/20 0520 10/20/20 0536 10/21/20 0805 10/22/20 0509 10/24/20 0652  NA 137   < > 140   < > 136  --  138 135 137  --   K 3.5   < > 3.3*   < > 4.1  --  4.3 4.2 4.2  --   CL 103   < > 104   < > 100  --  102 99 101  --   CO2 25   < > 25   < > 28  --  27 28 28   --   GLUCOSE 126*   < > 104*   < > 116*  --  124* 125* 106*  --   BUN 5*   < > 8   < > 10  --  10 9 9   --   CALCIUM 8.7*   < > 8.8*   < > 7.9*  --  8.3* 8.1* 8.3*  --   CREATININE 0.90   < > 0.72   < > 0.70   < > 0.71 0.69 0.68 0.82  GFRNONAA >60   < > >60   < > >60   < > >60 >60 >60 >60  GFRAA >60  --  >60  --   --   --   --   --   --   --    < > = values in this interval not displayed.    LIVER FUNCTION TESTS: Recent Labs    10/10/20 0513 10/13/20 0539 10/16/20 0607 10/20/20 0536  BILITOT 0.9 0.6 0.4 0.5  AST 17 22 24 18   ALT 17 19 23 20   ALKPHOS 98 91 78 108  PROT 6.4* 6.4* 5.9* 6.4*  ALBUMIN 2.6* 2.6* 2.4* 2.4*    Assessment and Plan:  Plan for CT aspiration left anterior  abdominal small fluid collection adjacent to the colostomy.  Consent obtained.  Risks and benefits discussed with the patient including bleeding, infection, damage to adjacent structures, bowel perforation/fistula connection, and sepsis.  All of the patient's questions were answered, patient is agreeable to proceed. Consent signed and in chart.    Thank you for this interesting consult.  I greatly enjoyed meeting Dio Giller Proehl [name] and look forward to participating in their care.  A copy of this report was sent to the requesting provider on this date.  Electronically Signed: Berdine Dance, MD 10/24/2020, 9:38 AM   I spent a total of 20 Minutes    in face to face in clinical consultation, greater than 50% of which was counseling/coordinating care for this pt.

## 2020-10-24 NOTE — Procedures (Signed)
Interventional Radiology Procedure Note  Procedure: CT ASP LEFT ANTERIOR ABD SMALL FLD COLLECTION    Complications: None  Estimated Blood Loss:  MIN  Findings: 15CC AMBER COLORED FLD CX SENT    Sharen Counter, MD

## 2020-10-24 NOTE — TOC Transition Note (Signed)
Transition of Care [facility]) - CM/SW Discharge Note   Patient Details  Name: Jordan [name] MRN: [id number] Date of Birth: 1981/06/26  Transition of Care [facility]) CM/SW Contact:  Margarito Liner, LCSW Phone Number: 10/24/2020, 11:47 AM   Clinical Narrative: Patient has orders to discharge today. [employer] representative is aware. CSW notified her that he will discharge with the JP drain. 3-in-1 was delivered to the room yesterday. No further concerns. CSW signing off.    Final next level of care: Home w Home Health Services Barriers to Discharge: Barriers Resolved   Patient Goals and CMS Choice Patient states their goals for this hospitalization and ongoing recovery are:: Patient wants to get well, he wants his stomach to heal and he wants to be able to go home. CMS Medicare.gov Compare Post Acute Care list provided to:: Patient Choice offered to / list presented to : Patient  Discharge Placement                    Patient and family notified of of transfer: 10/24/20  Discharge Plan and Services   Discharge Planning Services: CM Consult Post Acute Care Choice: Home Health          DME Arranged: 3-N-1 DME Agency: [employer] Date DME Agency Contacted: 10/23/20   Representative spoke with at DME Agency: Santina Evans HH Arranged: RN, PT, OT [facility] Agency: [facility] Date [facility] Agency Contacted: 10/24/20   Representative spoke with at [facility] Agency: Christy Gentles  Social Determinants of Health (SDOH) Interventions     Readmission Risk Interventions No flowsheet data found.

## 2020-10-24 NOTE — Consult Note (Signed)
Pharmacy Antibiotic Note  Jordan [name] is a 39 y.o. male admitted on 10/01/2020 with diverticulitis now 23 Days Post-Op s/p robotic assisted sigmoid colectomy, 9 Days Post-Op s/p robotic assisted laparoscopic Hartman's Procedure.  Pharmacy has been consulted for vancomycin and Zosyn dosing. His wound culture has grown out ampicillin-resistant Enterococcus.  Today, 10/24/2020  Patient back to OR 10/20 d/t persistent leak at anastomosis s/p colostomy  SCr stable  WBC trending down  Afebrile  10/24 05:26 vancomycin trough = 16  mcg/mL on vancomycin 1250 mg IV q8h     Plan:   1) Continue Vancomycin 1250 mg IV Q 8 hrs. Goal trough 15-20 mcg/ml, prefer to keep closer to 15 mcg/ml. Pt due for vancomycin trough but currently with d/c home orders. Will follow up if pt still here tomorrow.   2) Continue Zosyn 3.375g IV q8h (4 hour infusion).    Height: 5\' 6"  (167.6 cm) Weight: (!) 139.7 kg (308 lb) IBW/kg (Calculated) : 63.8  Temp (24hrs), Avg:98.5 F (36.9 C), Min:98.2 F (36.8 C), Max:98.8 F (37.1 C)  Recent Labs  Lab 10/19/20 0520 10/20/20 0536 10/21/20 0805 10/22/20 0509 10/23/20 0558 10/24/20 0652  WBC 15.2* 14.0* 12.1* 11.0* 9.4  --   CREATININE 0.70 0.71 0.69 0.68  --  0.82  VANCOTROUGH 16  --   --   --   --   --     Estimated Creatinine Clearance: 159.6 mL/min (by C-G formula based on SCr of 0.82 mg/dL).    Allergies  Allergen Reactions  . Butrans [Buprenorphine Hcl]     Difficulty breathing, n/v  . Nucynta [Tapentadol Hydrochloride] Anaphylaxis  . Tapentadol Hcl Anaphylaxis  . Glucose Polymer Other (See Comments)    poison  . Lactose Intolerance (Gi)   . Sumac     poison  . Bactrim [Sulfamethoxazole-Trimethoprim] Itching and Rash    Antimicrobials this admission: 10/12 Zosyn >>  10/19 vancomycin >>   Microbiology results: 10/09 SARS CoV-2: negative  10/09 influenza A/B: negative 10/29 WCx pending 10/15 WCx: E coli, E raffinosus, B  fragilis beta lactamase (+) 10/12 WCx: E coli, B fragilis  Thank you for allowing pharmacy to be a part of this patient's care.  12/12, PharmD, BCPS Clinical Pharmacist 10/24/2020 2:15 PM

## 2020-10-24 NOTE — Sedation Documentation (Signed)
Pt. Med. With Versed 1 mg IV, Fentanyl 50 mcg IV total for conscious sedation. Pt. Tolerated procedure well. Report called to floor RN Tawana. 15 ml of pinkish clear fluid aspirated by Dr. Miles Costain in CT.

## 2020-10-24 NOTE — Progress Notes (Signed)
PT Cancellation Note  Patient Details Name: Jordan [name] MRN: [id number] DOB: 07/04/1981   Cancelled Treatment:    Reason Eval/Treat Not Completed: Other (comment) Pt currently out of room for procedure, unavailable for therapy at this time. Will re-attempt next date, if appropriate.  Katherine Basset, SPT Baker Pierini 10/24/2020, 9:23 AM

## 2020-10-24 NOTE — Discharge Summary (Signed)
[facility] SURGICAL DISCHARGE SUMMARY  Patient ID: Jordan [name] MRN: [id number] DOB/AGE: [birth year] 39 y.o.  Admit date: 10/01/2020 Discharge date: 10/24/2020  Discharge Diagnoses Patient Active Problem List   Diagnosis Date Noted  . Diverticulitis large intestine 10/01/2020  . Diverticulitis of colon   . History of diverticulitis of colon 09/09/2020  . Colovesical fistula 09/09/2020    Consultants Urology (For initial ICG instillation in ureters) Interventional Radiology   Procedures 10/01/2020 - Dr Apolinar Junes (Urology):  1. Cystoscopy 2. Instillation of ureteral ICG bilateral  10/01/2020 - Dr Claudine Mouton:   1. Robotic sigmoid colectomy with coloproctostomy, intracorporeal anastomosis with natural orifice specimen extraction.  10/15/2020 - Dr Claudine Mouton:   1. Robotic assisted laparoscopic Hartman's procedure with extensive lysis of adhesions.   HPI: Jordan [name] is a 39 y.o. male with a history of complicated diverticulitis and colovesical fistula who presents to [facility] on 10/06 for scheduled robotic assisted laparoscopic sigmoid colectomy with Dr Claudine Mouton.    Hospital Course: Informed consent was obtained and documented, and patient underwent uneventful robotic assisted laparoscopic sigmoid colectomy (Dr Casimer Bilis, 10/01/2020). His post-operative course was complicated and I will outline details below:   10/06: Retested for COVID intra-operatively due possible tangential exposure, and he was found to be POSITIVE. Over the course of the next few days he would be retested and found to be NEGATIVE x2, but would remain in COVID isolation.   10/07 - 10/11: Did well, no major issues, diet advanced  10/12: Leukocytosis worsening. He underwent CT Abdomen/Pelvis which showed evidence of anastomotic leak and superficial wound infection. I personally preformed bedside I&D with copious amounts of purulence. Cx from this grew out E coli.  He has been on Zosyn previous to this. Diet was backed down to NPO. Given his clinical stability, we attempted to manage his leak conservatively.   10/13 - 10/14: No major changes, TPN initiated on 10/14  10/15: Underwent CT Abdomen/Pelvis which again showed anastomotic leak, but with new fluid collection in LLQ. IR consulted and he underwent drain placement  10/16: COVID precaution completed, removed from isolation, transferred to [facility]  10/17 - 10/19: Leukocytosis resolved in this timeframe. Vancomycin added to ABx regimen given Cx results from drain placement. Started diuresis with PO Lasix  10/20: Underwent CT Abdomen/Pelvis this time concerning for worsening of anastomotic leak with almost near >50 % dehisce of wound. He had also begun to develop low grade fevers overnight. At this point, it was clear that he would not tolerate conservative management and a dehiscence of that degree would not close itself. Informed consent was obtained and he underwent Robotic assisted laparoscopic Hartman's procedure with extensive lysis of adhesions that evening with Dr Claudine Mouton.   10/21 - 10/23: Diet initiated POD1 with CLD, tolerated well. Remained on goal rate TPN. PT/OT engaged in this timeframe.   10/24: Diet advanced to full liquids  10/25: Began weaning TPN; diet advanced to regular diet  10/26 - 10/28: TPN stopped. No other major changes. Leukocytosis resolving.  10/29: Underwent CT Abdomen/Pelvis for discharge planning which looked good. He did have a small intra-abdominal fluid collection lateral to his colostomy. IR consulted for aspiration. Leukocytosis resolved  10/30: Underwent CT aspiration. Discharged home.   On the day of discharge, the patient's pain was reasonably controlled, his leukocytosis has resolved and he had remained afebrile, he was mobilizing with PT/OT and home health had been established, and he was tolerating a diet with stable colostomy function. His aspiration on the  day  of discharge did not revealed gross purulence. We will send him home without ABx for now as he has completed course IV. We will follow up on Cx as outpatient and decide on PO Abx accordingly. He will discharge home with surgical drain and teaching/suppllies were given. Staples will be removed before discharge. He will follow up in 1 weeks time with Dr Claudine Mouton.    Discharge Condition: Good   Physical Examination:  Constitutional: alert, cooperative and no distress  Respiratory: breathing non-labored at rest  Cardiovascular: regular rate and sinus rhythm  Gastrointestinal:Soft,diffuse soreness, no appreciable distension, no rebound/guarding. Colostomy in left mid-abdomen which is flush to the skin, small amount of loose brown stool in bag,active flatus during examination. JP in RLQ with serosanguinous output, he is leaking aroundpreviousdrain site in LLQ Integumentary:Laparoscopic incisions now with skin staples and honeycomb, no appreciable erythema Musculoskeletal:1+ pittingedema   Allergies as of 10/24/2020      Reactions   Butrans [buprenorphine Hcl]    Difficulty breathing, n/v   Nucynta [tapentadol Hydrochloride] Anaphylaxis   Tapentadol Hcl Anaphylaxis   Glucose Polymer Other (See Comments)   poison   Lactose Intolerance (gi)    Sumac    poison   Bactrim [sulfamethoxazole-trimethoprim] Itching, Rash      Medication List    STOP taking these medications   ciprofloxacin 500 MG tablet Commonly known as: CIPRO     TAKE these medications   albuterol 108 (90 Base) MCG/ACT inhaler Commonly known as: VENTOLIN HFA Inhale 2 puffs into the lungs every 6 (six) hours as needed for wheezing or shortness of breath.   aspirin 81 MG tablet Take 81 mg by mouth daily.   cetirizine 10 MG tablet Commonly known as: ZYRTEC Take 1 tablet (10 mg total) by mouth daily.   furosemide 20 MG tablet Commonly known as: LASIX Take 1 tablet (20 mg total) by mouth daily for 14 days.    gabapentin 300 MG capsule Commonly known as: NEURONTIN Take 1 capsule (300 mg total) by mouth 3 (three) times daily.   methocarbamol 500 MG tablet Commonly known as: ROBAXIN Take 1 tablet (500 mg total) by mouth every 8 (eight) hours as needed for muscle spasms.   ondansetron 4 MG disintegrating tablet Commonly known as: ZOFRAN-ODT Take 1 tablet (4 mg total) by mouth every 6 (six) hours as needed for nausea.   Oxycodone HCl 10 MG Tabs Take 1 tablet (10 mg total) by mouth every 6 (six) hours as needed for severe pain or breakthrough pain.   phenazopyridine 95 MG tablet Commonly known as: PYRIDIUM Take 95 mg by mouth in the morning and at bedtime.            Durable Medical Equipment  (From admission, onward)         Start     Ordered   10/23/20 0950  For home use only DME 3 n 1  Once       Comments: Needs Bariatric 3 in 1 due to body habitus   10/23/20 0950            Follow-up Information    [facility] Follow up.   Specialty: Home Health Services Why: They will follow up with you for your home health needs. Contact information: [address] [place] [address] [phone]        [name], MD. Schedule an appointment as soon as possible for a visit in 1 week(s).   Specialty: General  Surgery Why: s/p robotic hartmans, has drain Contact information: [address] [place] [address] [phone]                Time spent on discharge management including discussion of hospital course, clinical condition, outpatient instructions, prescriptions, and follow up with the patient and members of the medical team: >30 minutes  -- Lynden Oxford , PA-C [facility]  10/24/2020, 11:14 AM [phone] M-F: 7am - 4pm

## 2020-10-25 DIAGNOSIS — F419 Anxiety disorder, unspecified: Secondary | ICD-10-CM | POA: Diagnosis not present

## 2020-10-25 DIAGNOSIS — T8132XD Disruption of internal operation (surgical) wound, not elsewhere classified, subsequent encounter: Secondary | ICD-10-CM | POA: Diagnosis not present

## 2020-10-25 DIAGNOSIS — G894 Chronic pain syndrome: Secondary | ICD-10-CM | POA: Diagnosis not present

## 2020-10-25 DIAGNOSIS — I10 Essential (primary) hypertension: Secondary | ICD-10-CM | POA: Diagnosis not present

## 2020-10-25 DIAGNOSIS — J45909 Unspecified asthma, uncomplicated: Secondary | ICD-10-CM | POA: Diagnosis not present

## 2020-10-25 DIAGNOSIS — Z433 Encounter for attention to colostomy: Secondary | ICD-10-CM | POA: Diagnosis not present

## 2020-10-25 DIAGNOSIS — F32A Depression, unspecified: Secondary | ICD-10-CM | POA: Diagnosis not present

## 2020-10-25 DIAGNOSIS — M1990 Unspecified osteoarthritis, unspecified site: Secondary | ICD-10-CM | POA: Diagnosis not present

## 2020-10-25 DIAGNOSIS — G4733 Obstructive sleep apnea (adult) (pediatric): Secondary | ICD-10-CM | POA: Diagnosis not present

## 2020-10-26 ENCOUNTER — Other Ambulatory Visit: Payer: Self-pay | Admitting: Surgery

## 2020-10-26 MED ORDER — CIPROFLOXACIN HCL 500 MG PO TABS: 500.0000 mg | ORAL_TABLET | Freq: Two times a day (BID) | ORAL | 0 refills | Status: AC

## 2020-10-27 ENCOUNTER — Other Ambulatory Visit: Payer: Self-pay | Admitting: Surgery

## 2020-10-27 ENCOUNTER — Other Ambulatory Visit: Payer: Self-pay

## 2020-10-27 DIAGNOSIS — G4733 Obstructive sleep apnea (adult) (pediatric): Secondary | ICD-10-CM | POA: Diagnosis not present

## 2020-10-27 DIAGNOSIS — J45909 Unspecified asthma, uncomplicated: Secondary | ICD-10-CM | POA: Diagnosis not present

## 2020-10-27 DIAGNOSIS — M1990 Unspecified osteoarthritis, unspecified site: Secondary | ICD-10-CM | POA: Diagnosis not present

## 2020-10-27 DIAGNOSIS — I10 Essential (primary) hypertension: Secondary | ICD-10-CM | POA: Diagnosis not present

## 2020-10-27 DIAGNOSIS — F419 Anxiety disorder, unspecified: Secondary | ICD-10-CM | POA: Diagnosis not present

## 2020-10-27 DIAGNOSIS — Z433 Encounter for attention to colostomy: Secondary | ICD-10-CM | POA: Diagnosis not present

## 2020-10-27 DIAGNOSIS — G894 Chronic pain syndrome: Secondary | ICD-10-CM | POA: Diagnosis not present

## 2020-10-27 DIAGNOSIS — T8132XD Disruption of internal operation (surgical) wound, not elsewhere classified, subsequent encounter: Secondary | ICD-10-CM | POA: Diagnosis not present

## 2020-10-27 DIAGNOSIS — F32A Depression, unspecified: Secondary | ICD-10-CM | POA: Diagnosis not present

## 2020-10-27 MED ORDER — OXYCODONE HCL 10 MG PO TABS: 10.0000 mg | ORAL_TABLET | Freq: Four times a day (QID) | ORAL | 0 refills | Status: DC | PRN

## 2020-10-27 MED ORDER — FLUCONAZOLE 100 MG PO TABS
100.0000 mg | ORAL_TABLET | Freq: Every day | ORAL | 0 refills | Status: AC
Start: 2020-10-27 — End: 2020-11-10

## 2020-10-27 NOTE — Patient Outreach (Signed)
[facility]) Care Management  10/27/2020  Temple Sporer Zapien III 1981/08/01 [id number]   Referral Date: 10/27/20 Referral Source: [employer] Report Date of Discharge: 10/24/20 Facility:  [facility] Insurance: [facility]   Referral received.  No outreach warranted at this time.  Transition of Care calls being completed via EMMI. RN CM will outreach patient for any red flags received.    Plan: RN CM will close case.    Bary Leriche, RN, MSN [facility] Care Management Care Management Coordinator Direct Line [phone] Toll Free: [phone]  Fax: [phone]

## 2020-10-28 DIAGNOSIS — J45909 Unspecified asthma, uncomplicated: Secondary | ICD-10-CM | POA: Diagnosis not present

## 2020-10-28 DIAGNOSIS — I10 Essential (primary) hypertension: Secondary | ICD-10-CM | POA: Diagnosis not present

## 2020-10-28 DIAGNOSIS — F32A Depression, unspecified: Secondary | ICD-10-CM | POA: Diagnosis not present

## 2020-10-28 DIAGNOSIS — T8132XD Disruption of internal operation (surgical) wound, not elsewhere classified, subsequent encounter: Secondary | ICD-10-CM | POA: Diagnosis not present

## 2020-10-28 DIAGNOSIS — Z433 Encounter for attention to colostomy: Secondary | ICD-10-CM | POA: Diagnosis not present

## 2020-10-28 DIAGNOSIS — F419 Anxiety disorder, unspecified: Secondary | ICD-10-CM | POA: Diagnosis not present

## 2020-10-28 DIAGNOSIS — M1990 Unspecified osteoarthritis, unspecified site: Secondary | ICD-10-CM | POA: Diagnosis not present

## 2020-10-28 DIAGNOSIS — G894 Chronic pain syndrome: Secondary | ICD-10-CM | POA: Diagnosis not present

## 2020-10-28 DIAGNOSIS — G4733 Obstructive sleep apnea (adult) (pediatric): Secondary | ICD-10-CM | POA: Diagnosis not present

## 2020-10-29 LAB — AEROBIC/ANAEROBIC CULTURE W GRAM STAIN (SURGICAL/DEEP WOUND): Special Requests: NORMAL

## 2020-10-30 ENCOUNTER — Telehealth: Payer: Self-pay | Admitting: Surgery

## 2020-10-30 ENCOUNTER — Encounter: Payer: Medicare HMO | Admitting: Surgery

## 2020-10-30 NOTE — Telephone Encounter (Signed)
Per Dr Claudine Mouton no additional labs are needed at this time. The medications that he is taking are covering the infection he has. She is aware to call if he has any changes in status. He will follow up as scheduled.

## 2020-10-30 NOTE — Telephone Encounter (Signed)
Pt's wife, Jordan [name], called asking for orders for lab work to be sent to [facility], who will be coming out to their house tomorrow.  The pt is scheduled to follow up w/Dr. Claudine Mouton next week & she would like the results to be on hand @ that time.  Thank you

## 2020-10-31 ENCOUNTER — Other Ambulatory Visit: Payer: Self-pay | Admitting: Physician Assistant

## 2020-10-31 DIAGNOSIS — F419 Anxiety disorder, unspecified: Secondary | ICD-10-CM | POA: Diagnosis not present

## 2020-10-31 DIAGNOSIS — Z433 Encounter for attention to colostomy: Secondary | ICD-10-CM | POA: Diagnosis not present

## 2020-10-31 DIAGNOSIS — J45909 Unspecified asthma, uncomplicated: Secondary | ICD-10-CM | POA: Diagnosis not present

## 2020-10-31 DIAGNOSIS — G4733 Obstructive sleep apnea (adult) (pediatric): Secondary | ICD-10-CM | POA: Diagnosis not present

## 2020-10-31 DIAGNOSIS — F32A Depression, unspecified: Secondary | ICD-10-CM | POA: Diagnosis not present

## 2020-10-31 DIAGNOSIS — G894 Chronic pain syndrome: Secondary | ICD-10-CM | POA: Diagnosis not present

## 2020-10-31 DIAGNOSIS — M1990 Unspecified osteoarthritis, unspecified site: Secondary | ICD-10-CM | POA: Diagnosis not present

## 2020-10-31 DIAGNOSIS — I10 Essential (primary) hypertension: Secondary | ICD-10-CM | POA: Diagnosis not present

## 2020-10-31 DIAGNOSIS — T8132XD Disruption of internal operation (surgical) wound, not elsewhere classified, subsequent encounter: Secondary | ICD-10-CM | POA: Diagnosis not present

## 2020-10-31 MED ORDER — FUROSEMIDE 20 MG PO TABS: 40.0000 mg | ORAL_TABLET | Freq: Every day | ORAL | 0 refills | Status: DC

## 2020-11-03 DIAGNOSIS — T8132XD Disruption of internal operation (surgical) wound, not elsewhere classified, subsequent encounter: Secondary | ICD-10-CM | POA: Diagnosis not present

## 2020-11-03 DIAGNOSIS — I10 Essential (primary) hypertension: Secondary | ICD-10-CM | POA: Diagnosis not present

## 2020-11-03 DIAGNOSIS — G4733 Obstructive sleep apnea (adult) (pediatric): Secondary | ICD-10-CM | POA: Diagnosis not present

## 2020-11-03 DIAGNOSIS — M1990 Unspecified osteoarthritis, unspecified site: Secondary | ICD-10-CM | POA: Diagnosis not present

## 2020-11-03 DIAGNOSIS — G894 Chronic pain syndrome: Secondary | ICD-10-CM | POA: Diagnosis not present

## 2020-11-03 DIAGNOSIS — F419 Anxiety disorder, unspecified: Secondary | ICD-10-CM | POA: Diagnosis not present

## 2020-11-03 DIAGNOSIS — F32A Depression, unspecified: Secondary | ICD-10-CM | POA: Diagnosis not present

## 2020-11-03 DIAGNOSIS — Z433 Encounter for attention to colostomy: Secondary | ICD-10-CM | POA: Diagnosis not present

## 2020-11-03 DIAGNOSIS — J45909 Unspecified asthma, uncomplicated: Secondary | ICD-10-CM | POA: Diagnosis not present

## 2020-11-04 ENCOUNTER — Other Ambulatory Visit: Payer: Self-pay

## 2020-11-04 ENCOUNTER — Encounter: Payer: Self-pay | Admitting: Surgery

## 2020-11-04 ENCOUNTER — Ambulatory Visit (INDEPENDENT_AMBULATORY_CARE_PROVIDER_SITE_OTHER): Payer: Medicare HMO | Admitting: Surgery

## 2020-11-04 VITALS — BP 135/84 | HR 80 | Temp 98.4°F | Resp 16 | Ht 66.0 in | Wt 289.0 lb

## 2020-11-04 DIAGNOSIS — Z933 Colostomy status: Secondary | ICD-10-CM | POA: Insufficient documentation

## 2020-11-04 MED ORDER — OXYCODONE HCL 10 MG PO TABS: 10.0000 mg | ORAL_TABLET | ORAL | 0 refills | Status: DC | PRN

## 2020-11-04 NOTE — Progress Notes (Signed)
[facility] POST-OP OFFICE VISIT  11/04/2020  HPI: Jordan [name] is a 39 y.o. male 20 days s/p robotic assisted Hartmann procedure for anastomotic leak following sigmoid colectomy for colovesical fistula secondary to diverticulitis.  Biggest issues seem to be with home health care today, not getting any assistance with appliance application, having repetitive leaking issues.  No history of any Mastisol or benzoin use.  Challenging just getting supplies needed.  Otherwise denies fevers chills.  Reports good appetite, good stomal function.  Right lower quadrant drain has diminished and output.  He they have doubled his Lasix doses and is having no further weeping from his left lower quadrant drain site.  Vital signs: BP 135/84   Pulse 80   Temp 98.4 F (36.9 C) (Oral)   Resp 16   Ht 5\' 6"  (1.676 m)   Wt 289 lb (131.1 kg)   SpO2 98%   BMI 46.65 kg/m    Physical Exam: Constitutional: He appears significantly better, far less edema noted in his lower extremities and across his pannus. Abdomen: All incisions are clean and dry and intact.  At present his stomal appliance appears to be intact.  There appears to be no adjacent skin irritation best I can see through the appliance.  Abdomen is soft and nontender. Skin: Incision is intact, pannus is no longer edema to as it was.  Assessment/Plan: This is a 39 y.o. male 20 days s/p Hartman's procedure.  Patient Active Problem List   Diagnosis Date Noted  . Diverticulitis large intestine 10/01/2020  . Diverticulitis of colon   . History of diverticulitis of colon 09/09/2020  . Colovesical fistula 09/09/2020  . Asthma 10/01/2016  . Morbid obesity (HCC) 02/06/2015  . OSA (obstructive sleep apnea) 10/24/2014  . Anxiety and depression 01/21/2010  . CHRONIC PAIN SYNDROME 01/21/2010  . HYPERTENSION, MILD 01/21/2010  . Osteoarthritis 01/21/2010    -We will reach out and attempt to get assist home health with  stomal nursing services. I will refill his pain medications of oxycodone for now. Encouraged him to continue his exercise program. Follow-up 1 month.   01/23/2010 M.D., FACS 11/04/2020, 12:54 PM

## 2020-11-04 NOTE — Patient Instructions (Addendum)
We will get in contact with the ostomy nurse and have them contact you.   We will refill your oxycodone.    Come back here in one month. Call with any questions.

## 2020-11-06 DIAGNOSIS — J45909 Unspecified asthma, uncomplicated: Secondary | ICD-10-CM | POA: Diagnosis not present

## 2020-11-06 DIAGNOSIS — I10 Essential (primary) hypertension: Secondary | ICD-10-CM | POA: Diagnosis not present

## 2020-11-06 DIAGNOSIS — F32A Depression, unspecified: Secondary | ICD-10-CM | POA: Diagnosis not present

## 2020-11-06 DIAGNOSIS — G4733 Obstructive sleep apnea (adult) (pediatric): Secondary | ICD-10-CM | POA: Diagnosis not present

## 2020-11-06 DIAGNOSIS — M1990 Unspecified osteoarthritis, unspecified site: Secondary | ICD-10-CM | POA: Diagnosis not present

## 2020-11-06 DIAGNOSIS — F419 Anxiety disorder, unspecified: Secondary | ICD-10-CM | POA: Diagnosis not present

## 2020-11-06 DIAGNOSIS — T8132XD Disruption of internal operation (surgical) wound, not elsewhere classified, subsequent encounter: Secondary | ICD-10-CM | POA: Diagnosis not present

## 2020-11-06 DIAGNOSIS — G894 Chronic pain syndrome: Secondary | ICD-10-CM | POA: Diagnosis not present

## 2020-11-06 DIAGNOSIS — Z433 Encounter for attention to colostomy: Secondary | ICD-10-CM | POA: Diagnosis not present

## 2020-11-07 DIAGNOSIS — Z433 Encounter for attention to colostomy: Secondary | ICD-10-CM | POA: Diagnosis not present

## 2020-11-07 DIAGNOSIS — F32A Depression, unspecified: Secondary | ICD-10-CM | POA: Diagnosis not present

## 2020-11-07 DIAGNOSIS — T8132XD Disruption of internal operation (surgical) wound, not elsewhere classified, subsequent encounter: Secondary | ICD-10-CM | POA: Diagnosis not present

## 2020-11-07 DIAGNOSIS — F419 Anxiety disorder, unspecified: Secondary | ICD-10-CM | POA: Diagnosis not present

## 2020-11-07 DIAGNOSIS — I10 Essential (primary) hypertension: Secondary | ICD-10-CM | POA: Diagnosis not present

## 2020-11-07 DIAGNOSIS — J45909 Unspecified asthma, uncomplicated: Secondary | ICD-10-CM | POA: Diagnosis not present

## 2020-11-07 DIAGNOSIS — G894 Chronic pain syndrome: Secondary | ICD-10-CM | POA: Diagnosis not present

## 2020-11-07 DIAGNOSIS — M1990 Unspecified osteoarthritis, unspecified site: Secondary | ICD-10-CM | POA: Diagnosis not present

## 2020-11-07 DIAGNOSIS — G4733 Obstructive sleep apnea (adult) (pediatric): Secondary | ICD-10-CM | POA: Diagnosis not present

## 2020-11-10 ENCOUNTER — Telehealth: Payer: Self-pay | Admitting: *Deleted

## 2020-11-10 NOTE — Telephone Encounter (Signed)
Patient called wants to see if he can get another refill for Robaxin, Lasix and Oxycodone. Please call and advise

## 2020-11-11 ENCOUNTER — Other Ambulatory Visit: Payer: Self-pay | Admitting: Surgery

## 2020-11-11 DIAGNOSIS — I10 Essential (primary) hypertension: Secondary | ICD-10-CM

## 2020-11-11 MED ORDER — FUROSEMIDE 20 MG PO TABS: 40.0000 mg | ORAL_TABLET | Freq: Every day | ORAL | 0 refills | Status: DC

## 2020-11-11 MED ORDER — OXYCODONE HCL 10 MG PO TABS: 10.0000 mg | ORAL_TABLET | Freq: Four times a day (QID) | ORAL | 0 refills | Status: DC | PRN

## 2020-11-11 MED ORDER — METHOCARBAMOL 500 MG PO TABS: 500.0000 mg | ORAL_TABLET | Freq: Three times a day (TID) | ORAL | 0 refills | Status: DC | PRN

## 2020-11-11 NOTE — Telephone Encounter (Signed)
Dr Claudine Mouton has refilled all of the patient's medications. He did change his dosage on his Lasix.

## 2020-11-12 ENCOUNTER — Telehealth: Payer: Self-pay | Admitting: *Deleted

## 2020-11-12 DIAGNOSIS — M1990 Unspecified osteoarthritis, unspecified site: Secondary | ICD-10-CM | POA: Diagnosis not present

## 2020-11-12 DIAGNOSIS — Z433 Encounter for attention to colostomy: Secondary | ICD-10-CM | POA: Diagnosis not present

## 2020-11-12 DIAGNOSIS — J45909 Unspecified asthma, uncomplicated: Secondary | ICD-10-CM | POA: Diagnosis not present

## 2020-11-12 DIAGNOSIS — G894 Chronic pain syndrome: Secondary | ICD-10-CM | POA: Diagnosis not present

## 2020-11-12 DIAGNOSIS — F419 Anxiety disorder, unspecified: Secondary | ICD-10-CM | POA: Diagnosis not present

## 2020-11-12 DIAGNOSIS — T8132XD Disruption of internal operation (surgical) wound, not elsewhere classified, subsequent encounter: Secondary | ICD-10-CM | POA: Diagnosis not present

## 2020-11-12 DIAGNOSIS — F32A Depression, unspecified: Secondary | ICD-10-CM | POA: Diagnosis not present

## 2020-11-12 DIAGNOSIS — G4733 Obstructive sleep apnea (adult) (pediatric): Secondary | ICD-10-CM | POA: Diagnosis not present

## 2020-11-12 DIAGNOSIS — I10 Essential (primary) hypertension: Secondary | ICD-10-CM | POA: Diagnosis not present

## 2020-11-12 NOTE — Telephone Encounter (Signed)
Spoke with pharmacy rep to confirm they have received patient prescription refill for oxycodone, pharmacy rep states they had received the prescription and it is delivery only. Prescriptions are delivered Monday, Wednesday and Friday. Patient's prescription was mailed out today. Patient's wife was made aware of this and also notified that patient is home to sign and receive prescription. Patient's wife verbalized understanding and has no further questions.

## 2020-11-12 NOTE — Telephone Encounter (Signed)
Patients wife Jordan [name] called and had spoke with a nurse yesterday about getting some more medications. Everything was refilled but the oxycodone, she was told that the nurse was going to check with the doctor. Patients wife is just following up with that. Please call and advise

## 2020-11-13 DIAGNOSIS — M1990 Unspecified osteoarthritis, unspecified site: Secondary | ICD-10-CM | POA: Diagnosis not present

## 2020-11-13 DIAGNOSIS — F419 Anxiety disorder, unspecified: Secondary | ICD-10-CM | POA: Diagnosis not present

## 2020-11-13 DIAGNOSIS — I10 Essential (primary) hypertension: Secondary | ICD-10-CM | POA: Diagnosis not present

## 2020-11-13 DIAGNOSIS — Z433 Encounter for attention to colostomy: Secondary | ICD-10-CM | POA: Diagnosis not present

## 2020-11-13 DIAGNOSIS — G894 Chronic pain syndrome: Secondary | ICD-10-CM | POA: Diagnosis not present

## 2020-11-13 DIAGNOSIS — T8132XD Disruption of internal operation (surgical) wound, not elsewhere classified, subsequent encounter: Secondary | ICD-10-CM | POA: Diagnosis not present

## 2020-11-13 DIAGNOSIS — J45909 Unspecified asthma, uncomplicated: Secondary | ICD-10-CM | POA: Diagnosis not present

## 2020-11-13 DIAGNOSIS — F32A Depression, unspecified: Secondary | ICD-10-CM | POA: Diagnosis not present

## 2020-11-13 DIAGNOSIS — G4733 Obstructive sleep apnea (adult) (pediatric): Secondary | ICD-10-CM | POA: Diagnosis not present

## 2020-11-17 DIAGNOSIS — Z933 Colostomy status: Secondary | ICD-10-CM | POA: Diagnosis not present

## 2020-11-17 DIAGNOSIS — M1990 Unspecified osteoarthritis, unspecified site: Secondary | ICD-10-CM | POA: Diagnosis not present

## 2020-11-17 DIAGNOSIS — G894 Chronic pain syndrome: Secondary | ICD-10-CM | POA: Diagnosis not present

## 2020-11-17 DIAGNOSIS — F419 Anxiety disorder, unspecified: Secondary | ICD-10-CM | POA: Diagnosis not present

## 2020-11-17 DIAGNOSIS — T8132XD Disruption of internal operation (surgical) wound, not elsewhere classified, subsequent encounter: Secondary | ICD-10-CM | POA: Diagnosis not present

## 2020-11-17 DIAGNOSIS — Z433 Encounter for attention to colostomy: Secondary | ICD-10-CM | POA: Diagnosis not present

## 2020-11-17 DIAGNOSIS — J45909 Unspecified asthma, uncomplicated: Secondary | ICD-10-CM | POA: Diagnosis not present

## 2020-11-17 DIAGNOSIS — K631 Perforation of intestine (nontraumatic): Secondary | ICD-10-CM | POA: Diagnosis not present

## 2020-11-17 DIAGNOSIS — G4733 Obstructive sleep apnea (adult) (pediatric): Secondary | ICD-10-CM | POA: Diagnosis not present

## 2020-11-17 DIAGNOSIS — F32A Depression, unspecified: Secondary | ICD-10-CM | POA: Diagnosis not present

## 2020-11-17 DIAGNOSIS — I10 Essential (primary) hypertension: Secondary | ICD-10-CM | POA: Diagnosis not present

## 2020-11-18 DIAGNOSIS — I10 Essential (primary) hypertension: Secondary | ICD-10-CM | POA: Diagnosis not present

## 2020-11-18 DIAGNOSIS — F32A Depression, unspecified: Secondary | ICD-10-CM | POA: Diagnosis not present

## 2020-11-18 DIAGNOSIS — G894 Chronic pain syndrome: Secondary | ICD-10-CM | POA: Diagnosis not present

## 2020-11-18 DIAGNOSIS — M1990 Unspecified osteoarthritis, unspecified site: Secondary | ICD-10-CM | POA: Diagnosis not present

## 2020-11-18 DIAGNOSIS — J45909 Unspecified asthma, uncomplicated: Secondary | ICD-10-CM | POA: Diagnosis not present

## 2020-11-18 DIAGNOSIS — T8132XD Disruption of internal operation (surgical) wound, not elsewhere classified, subsequent encounter: Secondary | ICD-10-CM | POA: Diagnosis not present

## 2020-11-18 DIAGNOSIS — Z433 Encounter for attention to colostomy: Secondary | ICD-10-CM | POA: Diagnosis not present

## 2020-11-18 DIAGNOSIS — G4733 Obstructive sleep apnea (adult) (pediatric): Secondary | ICD-10-CM | POA: Diagnosis not present

## 2020-11-18 DIAGNOSIS — F419 Anxiety disorder, unspecified: Secondary | ICD-10-CM | POA: Diagnosis not present

## 2020-11-19 DIAGNOSIS — Z433 Encounter for attention to colostomy: Secondary | ICD-10-CM | POA: Diagnosis not present

## 2020-11-19 DIAGNOSIS — J45909 Unspecified asthma, uncomplicated: Secondary | ICD-10-CM | POA: Diagnosis not present

## 2020-11-19 DIAGNOSIS — T8132XD Disruption of internal operation (surgical) wound, not elsewhere classified, subsequent encounter: Secondary | ICD-10-CM | POA: Diagnosis not present

## 2020-11-19 DIAGNOSIS — F419 Anxiety disorder, unspecified: Secondary | ICD-10-CM | POA: Diagnosis not present

## 2020-11-19 DIAGNOSIS — F32A Depression, unspecified: Secondary | ICD-10-CM | POA: Diagnosis not present

## 2020-11-19 DIAGNOSIS — G4733 Obstructive sleep apnea (adult) (pediatric): Secondary | ICD-10-CM | POA: Diagnosis not present

## 2020-11-19 DIAGNOSIS — G894 Chronic pain syndrome: Secondary | ICD-10-CM | POA: Diagnosis not present

## 2020-11-19 DIAGNOSIS — M1990 Unspecified osteoarthritis, unspecified site: Secondary | ICD-10-CM | POA: Diagnosis not present

## 2020-11-19 DIAGNOSIS — I10 Essential (primary) hypertension: Secondary | ICD-10-CM | POA: Diagnosis not present

## 2020-11-24 ENCOUNTER — Telehealth: Payer: Self-pay | Admitting: Physician Assistant

## 2020-11-24 DIAGNOSIS — Z933 Colostomy status: Secondary | ICD-10-CM

## 2020-11-24 DIAGNOSIS — Z433 Encounter for attention to colostomy: Secondary | ICD-10-CM | POA: Diagnosis not present

## 2020-11-24 DIAGNOSIS — F419 Anxiety disorder, unspecified: Secondary | ICD-10-CM | POA: Diagnosis not present

## 2020-11-24 DIAGNOSIS — I10 Essential (primary) hypertension: Secondary | ICD-10-CM | POA: Diagnosis not present

## 2020-11-24 DIAGNOSIS — M1990 Unspecified osteoarthritis, unspecified site: Secondary | ICD-10-CM | POA: Diagnosis not present

## 2020-11-24 DIAGNOSIS — T8132XD Disruption of internal operation (surgical) wound, not elsewhere classified, subsequent encounter: Secondary | ICD-10-CM | POA: Diagnosis not present

## 2020-11-24 DIAGNOSIS — G4733 Obstructive sleep apnea (adult) (pediatric): Secondary | ICD-10-CM | POA: Diagnosis not present

## 2020-11-24 DIAGNOSIS — G894 Chronic pain syndrome: Secondary | ICD-10-CM | POA: Diagnosis not present

## 2020-11-24 DIAGNOSIS — Z9049 Acquired absence of other specified parts of digestive tract: Secondary | ICD-10-CM

## 2020-11-24 DIAGNOSIS — J45909 Unspecified asthma, uncomplicated: Secondary | ICD-10-CM | POA: Diagnosis not present

## 2020-11-24 DIAGNOSIS — F32A Depression, unspecified: Secondary | ICD-10-CM | POA: Diagnosis not present

## 2020-11-24 NOTE — Telephone Encounter (Signed)
New GI referral placed for issues with colostomy bag. AS, CMA

## 2020-11-24 NOTE — Addendum Note (Signed)
Addended by: Sylvester Harder on: 11/24/2020 01:42 PM   Modules accepted: Orders

## 2020-11-24 NOTE — Telephone Encounter (Signed)
Patient needs a referral to a GI doctor. Thanks. His [name] called.

## 2020-11-25 ENCOUNTER — Other Ambulatory Visit: Payer: Self-pay

## 2020-11-25 ENCOUNTER — Ambulatory Visit (INDEPENDENT_AMBULATORY_CARE_PROVIDER_SITE_OTHER): Payer: Medicare HMO | Admitting: Physician Assistant

## 2020-11-25 ENCOUNTER — Encounter: Payer: Self-pay | Admitting: Physician Assistant

## 2020-11-25 VITALS — BP 139/89 | HR 86 | Temp 98.5°F | Ht 66.0 in | Wt 278.2 lb

## 2020-11-25 DIAGNOSIS — Z933 Colostomy status: Secondary | ICD-10-CM

## 2020-11-25 DIAGNOSIS — Z09 Encounter for follow-up examination after completed treatment for conditions other than malignant neoplasm: Secondary | ICD-10-CM

## 2020-11-25 MED ORDER — OXYCODONE HCL 10 MG PO TABS: 10.0000 mg | ORAL_TABLET | Freq: Four times a day (QID) | ORAL | 0 refills | Status: DC | PRN

## 2020-11-25 NOTE — Patient Instructions (Signed)
CT scheduled for 12/01/2020 @ 1pm at [facility]. Please go to Outpatient Imaging today to pick up your prep kit and instructions. Do not eat/.drink 4 hours prior to having the scan. Please see your follow appointment below.

## 2020-11-25 NOTE — Progress Notes (Signed)
[facility] POST-OP OFFICE VISIT  11/25/2020  HPI: Jordan [name] is a 39 y.o. male ~1 month s/p robotic assisted Hartmann procedure for anastomotic leak following sigmoid colectomy for colovesical fistula secondary to diverticulitis  His biggest issues this afternoon are abdominal pain, which is relatively generalized, comes and goes intermittently, but these are very sharp in nature.  Additionally, they are having a harder time maintaining a seal on his appliance given the degree of retraction and narrow opening. He continues to have bowel function and gas through this.  No fever, chills, nausea, emesis He does have a decreased appetite but is supplementing his nutrition Mobilizing well  Vital signs: BP 139/89   Pulse 86   Temp 98.5 F (36.9 C) (Oral)   Ht 5\' 6"  (1.676 m)   Wt 278 lb 3.2 oz (126.2 kg)   SpO2 98%   BMI 44.90 kg/m    Physical Exam: Constitutional: Well appearing male, NAD Abdomen: Soft, he does appear diffusely sore, no appreciable distension. Colostomy in the left abdomen, lateral to umbilicus, this is markedly retracted. I am concerned that the skin is starting to scar over the opening of this. This is extremely tight when attempting to digitize the opening.  Skin: Laparoscopic incisions are all well healed  Colostomy (11/25/2020):     Assessment/Plan: This is a 39 y.o. male ~1 month s/p robotic assisted Hartmann procedure for anastomotic leak following sigmoid colectomy for colovesical fistula secondary to diverticulitis   - I am somewhat concerned by the appearance of his colostomy and that at some point in the near future his skin may completely scar over. His wife at bedside, is doing an amazing job maintaining his appliance and doing appropriate changes. Given the reports of more frequent abdominal pain and changes in colostomy appearance, I will go ahead an get a CT Abdomen/Pelvis to reassess his abdomen. My concern is  that his colostomy may be narrowing. I will give him a very short refill of pain medications as well. He will continue current colostomy care. I will have him move up his follow up with Dr 41 to early next week, so he can reassess the stoma. Again, my concern is, he may need revision vs control dilation in the OR. Patient and his wife were understanding of my thought process and agreeable. We will see them next week.   -- Claudine Mouton, PA-C [facility] 11/25/2020, 3:46 PM [phone] M-F: 7am - 4pm

## 2020-11-26 ENCOUNTER — Telehealth: Payer: Self-pay | Admitting: Physician Assistant

## 2020-11-26 ENCOUNTER — Telehealth: Payer: Self-pay | Admitting: Internal Medicine

## 2020-11-26 DIAGNOSIS — G4733 Obstructive sleep apnea (adult) (pediatric): Secondary | ICD-10-CM | POA: Diagnosis not present

## 2020-11-26 DIAGNOSIS — Z933 Colostomy status: Secondary | ICD-10-CM

## 2020-11-26 NOTE — Telephone Encounter (Signed)
Patient's wife called in wanting to know the status of the referral placed for tim for the GI. His stomach is closing up according to [facility] and she states it is urgent.

## 2020-11-26 NOTE — Addendum Note (Signed)
Addended by: Stan Head on: 11/26/2020 03:59 PM   Modules accepted: Orders

## 2020-11-26 NOTE — Telephone Encounter (Signed)
Hey Dr Leone Payor, this pt is being referred to [place] from [facility] for Colostomy status [facility]) Z90.49 (ICD-10-CM) - H/O colectomy, it looks like pt has been seen by [facility] on 09/09/2020, records are in epic for review, please advise on scheduling.

## 2020-11-26 NOTE — Telephone Encounter (Signed)
Pt's spouse called back requesting that referral be placed to Dr. Durenda Hurt, GI at [facility] so that they can get an earlier appt with this provider.  After reviewing pt's chart, it appears from surgeon's note from 11/25/2020, pt needed to see a surgeon, not GI.  Attempted to contact pt's spouse to discuss and clarify referral.  However, spouse did not answer and VM box was full and could not accept messages.  Will attempt to contact spouse again.  Tiajuana Amass, CMA

## 2020-11-26 NOTE — Telephone Encounter (Signed)
Referral placed to Dr. Durenda Hurt and records faxed to his office at fax number provided by pt's spouse [phone]).  Tiajuana Amass, CMA

## 2020-11-28 DIAGNOSIS — K9409 Other complications of colostomy: Secondary | ICD-10-CM | POA: Diagnosis not present

## 2020-11-28 DIAGNOSIS — Z933 Colostomy status: Secondary | ICD-10-CM | POA: Insufficient documentation

## 2020-11-28 DIAGNOSIS — R1013 Epigastric pain: Secondary | ICD-10-CM | POA: Diagnosis not present

## 2020-11-28 DIAGNOSIS — K5780 Diverticulitis of intestine, part unspecified, with perforation and abscess without bleeding: Secondary | ICD-10-CM | POA: Diagnosis not present

## 2020-11-28 NOTE — Telephone Encounter (Signed)
I have reviewed the chart and see that he subsequently saw surgery and has complications from the surgery that are being handled.  I messaged primary care who made the referral as well through staff messaging.  I do not think we can help this man and that an appointment with [place] is not necessary.  I will await her response Mayer Masker, PA-C)

## 2020-12-01 ENCOUNTER — Ambulatory Visit
Admission: RE | Admit: 2020-12-01 | Discharge: 2020-12-01 | Disposition: A | Discharge status: Home / Self Care | Payer: Medicare HMO | Source: Ambulatory Visit | Attending: Physician Assistant | Admitting: Physician Assistant

## 2020-12-01 ENCOUNTER — Other Ambulatory Visit: Payer: Self-pay

## 2020-12-01 DIAGNOSIS — Z933 Colostomy status: Secondary | ICD-10-CM | POA: Insufficient documentation

## 2020-12-01 DIAGNOSIS — K5732 Diverticulitis of large intestine without perforation or abscess without bleeding: Secondary | ICD-10-CM | POA: Diagnosis not present

## 2020-12-01 LAB — POCT I-STAT CREATININE: Creatinine, Ser: 0.7 mg/dL (ref 0.61–1.24)

## 2020-12-01 MED ORDER — IOHEXOL 300 MG/ML  SOLN
100.0000 mL | Freq: Once | INTRAMUSCULAR | Status: AC | PRN
  Administered 2020-12-01: 100 mL via INTRAVENOUS

## 2020-12-02 ENCOUNTER — Ambulatory Visit: Payer: Medicare HMO | Admitting: Surgery

## 2020-12-02 DIAGNOSIS — K9409 Other complications of colostomy: Secondary | ICD-10-CM | POA: Diagnosis not present

## 2020-12-04 ENCOUNTER — Encounter: Payer: Medicare HMO | Admitting: Surgery

## 2020-12-17 DIAGNOSIS — K631 Perforation of intestine (nontraumatic): Secondary | ICD-10-CM | POA: Diagnosis not present

## 2020-12-17 DIAGNOSIS — Z933 Colostomy status: Secondary | ICD-10-CM | POA: Diagnosis not present

## 2020-12-18 DIAGNOSIS — K631 Perforation of intestine (nontraumatic): Secondary | ICD-10-CM | POA: Diagnosis not present

## 2020-12-18 DIAGNOSIS — Z933 Colostomy status: Secondary | ICD-10-CM | POA: Diagnosis not present

## 2020-12-22 DIAGNOSIS — Z933 Colostomy status: Secondary | ICD-10-CM | POA: Diagnosis not present

## 2020-12-22 DIAGNOSIS — K631 Perforation of intestine (nontraumatic): Secondary | ICD-10-CM | POA: Diagnosis not present

## 2020-12-26 DIAGNOSIS — Z933 Colostomy status: Secondary | ICD-10-CM | POA: Diagnosis not present

## 2020-12-26 DIAGNOSIS — K631 Perforation of intestine (nontraumatic): Secondary | ICD-10-CM | POA: Diagnosis not present

## 2021-01-01 ENCOUNTER — Encounter: Payer: Medicare HMO | Admitting: Physician Assistant

## 2021-01-03 IMAGING — CT CT ABD-PELV W/ CM
2 of 5 series · 16 of 46 positions shown, 18 images · IV contrast (APPLIED)
Comparison: 10/23/2020

CLINICAL DATA: Diverticulitis. Fistula repair between bladder and
colon with surgery x2. Colostomy. Pain at colostomy site.

EXAM:
CT ABDOMEN AND PELVIS WITH CONTRAST
TECHNIQUE: Multidetector CT imaging of the abdomen and pelvis was performed
using the standard protocol following bolus administration of
intravenous contrast.
CONTRAST:  100mL OMNIPAQUE IOHEXOL 300 MG/ML  SOLN

[Series 2: routine abd/pel with · axial · 0.86mm/px · z∈[-565,-115]mm · 13 of 104 slices shown, 15 images]
[im 7/104  soft-tissue]
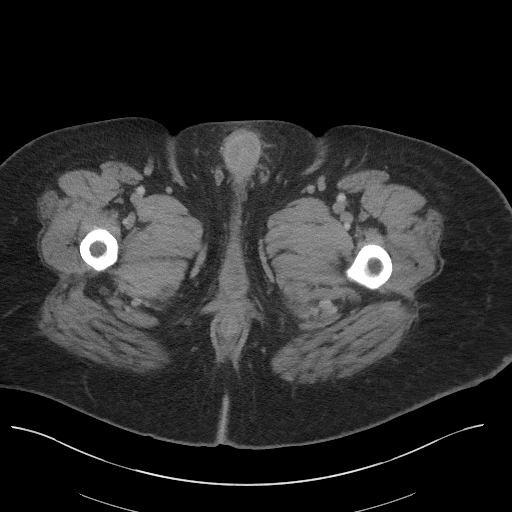
[im 7/104  bone]
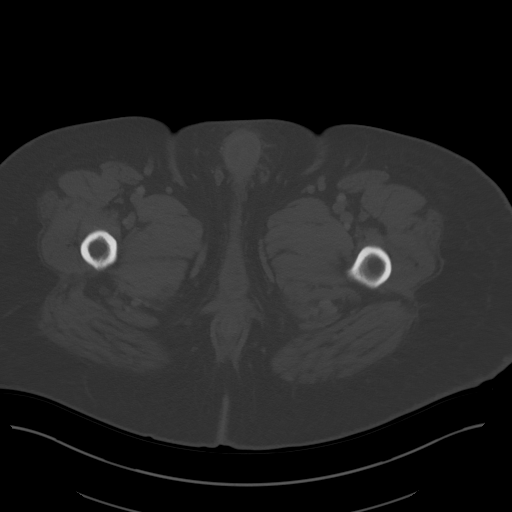
[im 14/104  soft-tissue]
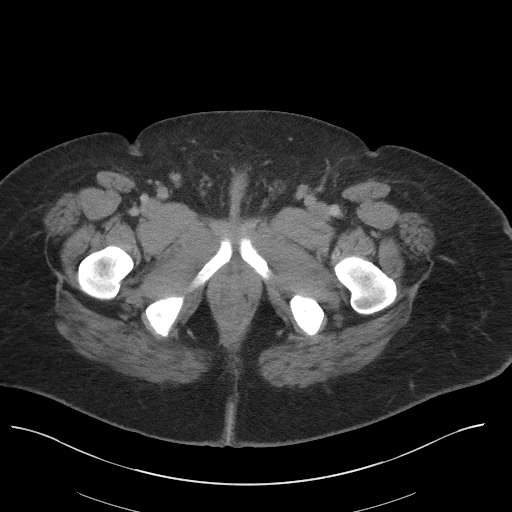
[im 21/104  soft-tissue]
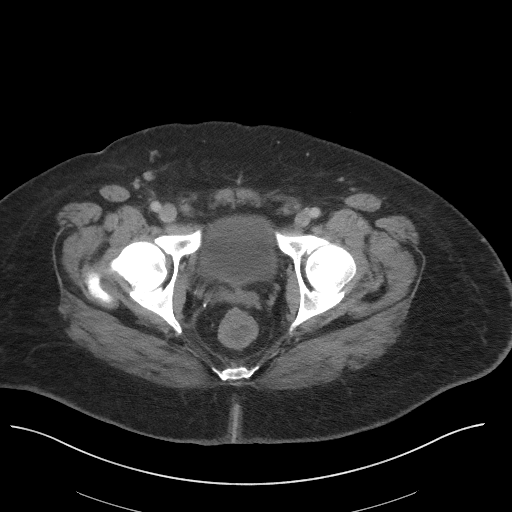
[im 28/104  soft-tissue]
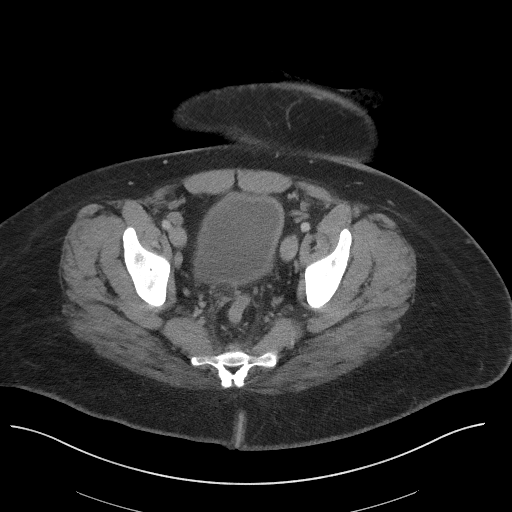
[im 35/104  soft-tissue]
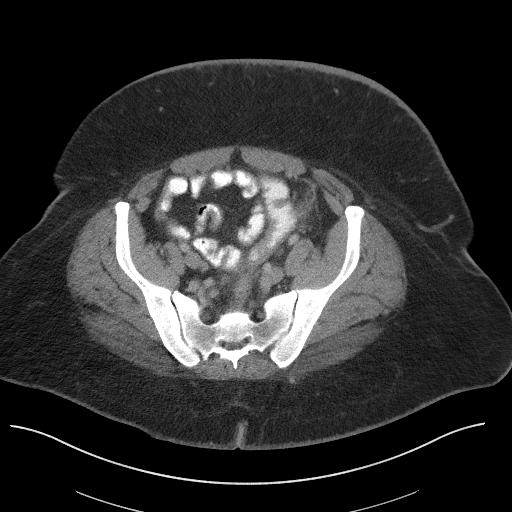
[im 42/104  soft-tissue]
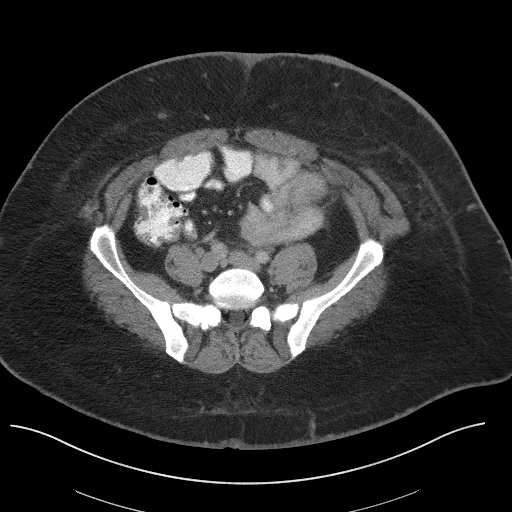
[im 55/104  soft-tissue]
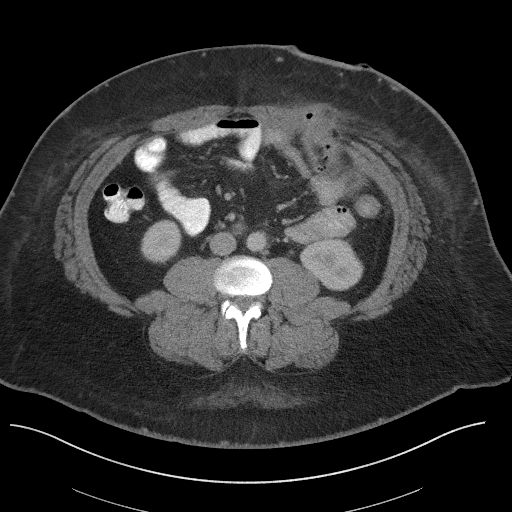
[im 62/104  soft-tissue]
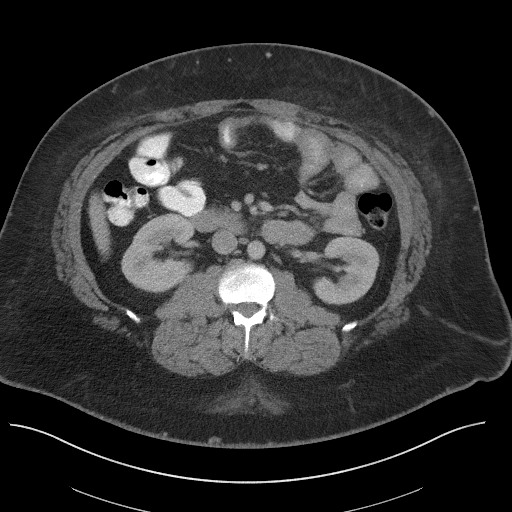
[im 69/104  soft-tissue]
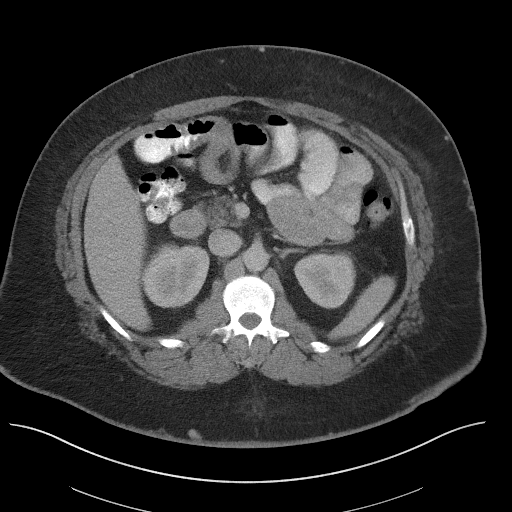
[im 69/104  bone]
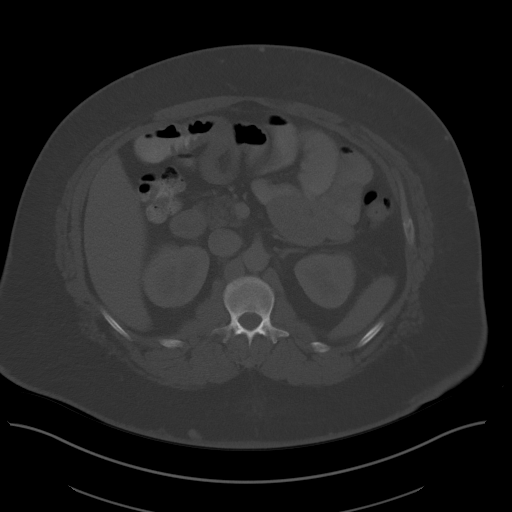
[im 76/104  soft-tissue]
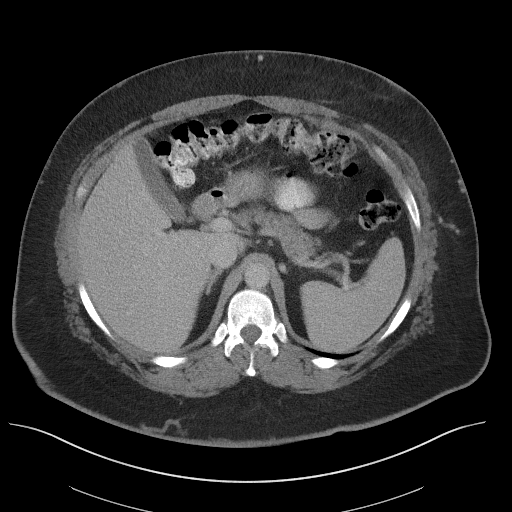
[im 83/104  soft-tissue]
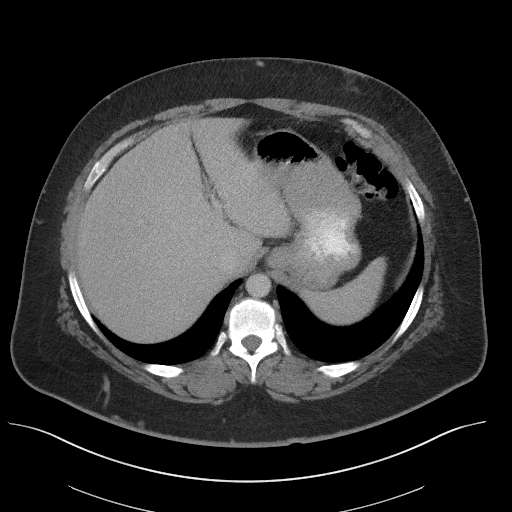
[im 90/104  soft-tissue]
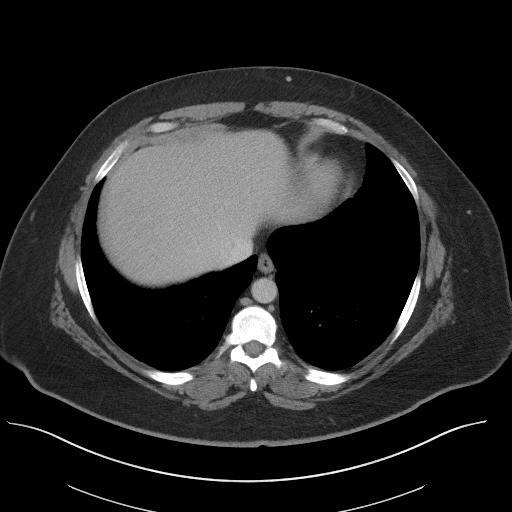
[im 97/104  soft-tissue]
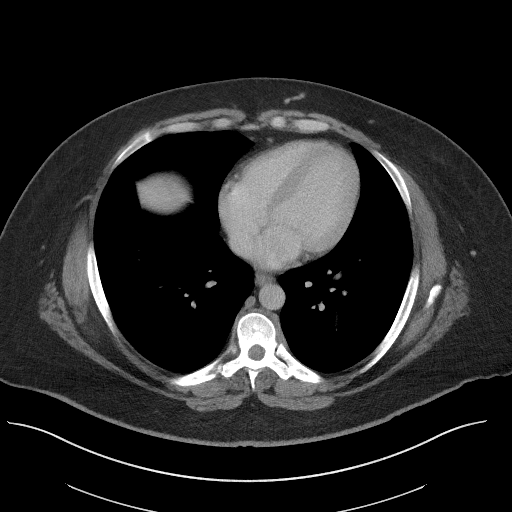

[Series 6: coronal st · coronal · 0.82mm/px · 3 of 118 slices shown]
[im 40/118  soft-tissue]
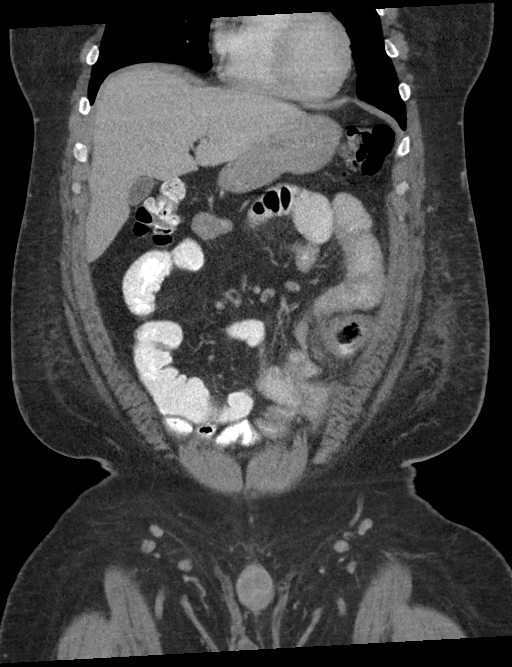
[im 53/118  soft-tissue]
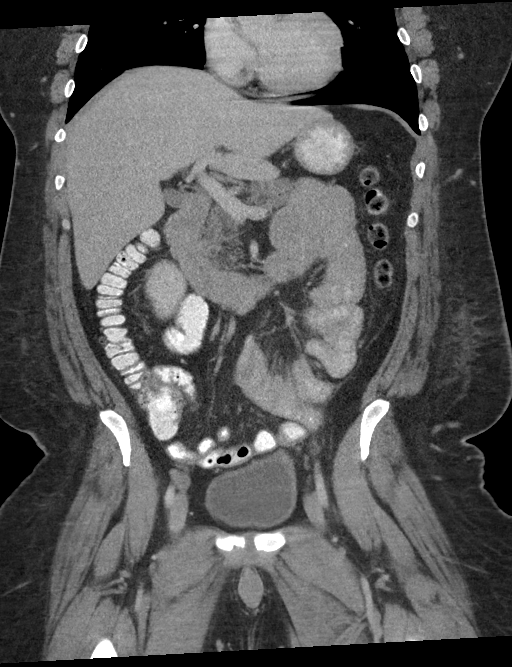
[im 66/118  soft-tissue]
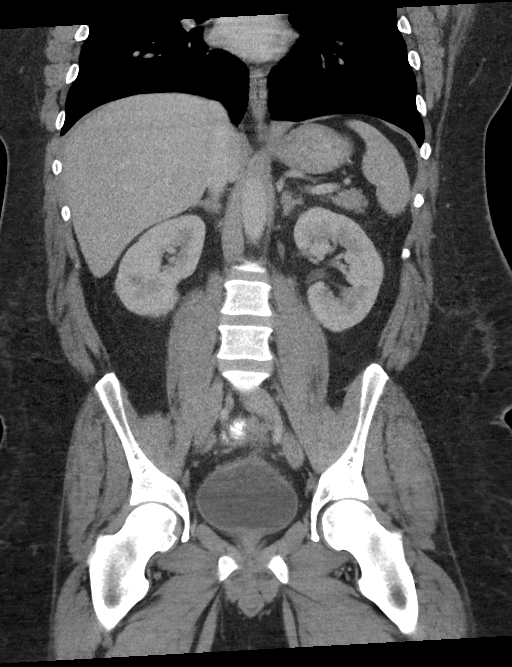

[16 of 46 positions shown; findings below may reference images not displayed]

FINDINGS: Lower chest: Clear lung bases. Normal heart size without pericardial
or pleural effusion.

Hepatobiliary: Nonspecific caudate lobe enlargement. No focal liver
lesion. Normal gallbladder, without biliary ductal dilatation.

Pancreas: Fatty replacement involving the pancreatic head and
uncinate process. No duct dilatation or acute inflammation.

Spleen: Normal in size, without focal abnormality.

Adrenals/Urinary Tract: Normal adrenal glands. Normal kidneys,
without hydronephrosis. Normal urinary bladder.

Stomach/Bowel: Normal stomach, without wall thickening. Hartmann's
pouch. The fluid collection adjacent the tip of the Hartmann's pouch
has resolved.

Descending colostomy. There is edema surrounding and wall thickening
of the colon at the ostomy site, including on 53/2. Single
diverticulum is identified within this region on 49/2. The left
abdominal fluid collection has resolved.

Normal terminal ileum and appendix. Normal small bowel. No free
intraperitoneal air.

Vascular/Lymphatic: Normal caliber of the aorta and branch vessels.
No abdominal adenopathy. Prominent pelvic sidewall and inguinal
nodes are likely reactive.

Reproductive: Normal prostate.

Other: No significant free fluid. No free intraperitoneal air.
Subcutaneous edema is improved and may relate to anasarca about the
anterior abdominal wall.

Musculoskeletal: No acute osseous abnormality.
IMPRESSION: 1. Status post Hartmann's pouch and descending colostomy. Edema
about the distal most colon, at the ostomy site is indicative of non
complicated diverticulitis (given diverticulum in this area) versus
less likely colitis.
2. Resolution of abdominopelvic fluid collections since 10/23/2020.

## 2021-01-07 DIAGNOSIS — G4733 Obstructive sleep apnea (adult) (pediatric): Secondary | ICD-10-CM | POA: Diagnosis not present

## 2021-01-27 DIAGNOSIS — K9409 Other complications of colostomy: Secondary | ICD-10-CM | POA: Diagnosis not present

## 2021-01-29 DIAGNOSIS — Z933 Colostomy status: Secondary | ICD-10-CM | POA: Diagnosis not present

## 2021-01-29 DIAGNOSIS — K631 Perforation of intestine (nontraumatic): Secondary | ICD-10-CM | POA: Diagnosis not present

## 2021-02-03 DIAGNOSIS — Z933 Colostomy status: Secondary | ICD-10-CM | POA: Diagnosis not present

## 2021-02-03 DIAGNOSIS — K631 Perforation of intestine (nontraumatic): Secondary | ICD-10-CM | POA: Diagnosis not present

## 2021-02-04 DIAGNOSIS — K631 Perforation of intestine (nontraumatic): Secondary | ICD-10-CM | POA: Diagnosis not present

## 2021-02-04 DIAGNOSIS — Z933 Colostomy status: Secondary | ICD-10-CM | POA: Diagnosis not present

## 2021-02-07 DIAGNOSIS — G4733 Obstructive sleep apnea (adult) (pediatric): Secondary | ICD-10-CM | POA: Diagnosis not present

## 2021-02-19 DIAGNOSIS — Z933 Colostomy status: Secondary | ICD-10-CM | POA: Diagnosis not present

## 2021-02-19 DIAGNOSIS — K9403 Colostomy malfunction: Secondary | ICD-10-CM | POA: Diagnosis not present

## 2021-02-26 DIAGNOSIS — Z933 Colostomy status: Secondary | ICD-10-CM | POA: Diagnosis not present

## 2021-02-26 DIAGNOSIS — K631 Perforation of intestine (nontraumatic): Secondary | ICD-10-CM | POA: Diagnosis not present

## 2021-03-02 DIAGNOSIS — Z933 Colostomy status: Secondary | ICD-10-CM | POA: Diagnosis not present

## 2021-03-02 DIAGNOSIS — Z20822 Contact with and (suspected) exposure to covid-19: Secondary | ICD-10-CM | POA: Diagnosis not present

## 2021-03-02 DIAGNOSIS — Z7982 Long term (current) use of aspirin: Secondary | ICD-10-CM | POA: Diagnosis not present

## 2021-03-02 DIAGNOSIS — K9409 Other complications of colostomy: Secondary | ICD-10-CM | POA: Diagnosis not present

## 2021-03-02 DIAGNOSIS — K631 Perforation of intestine (nontraumatic): Secondary | ICD-10-CM | POA: Diagnosis not present

## 2021-03-03 DIAGNOSIS — K9409 Other complications of colostomy: Secondary | ICD-10-CM | POA: Diagnosis not present

## 2021-03-03 DIAGNOSIS — Z7982 Long term (current) use of aspirin: Secondary | ICD-10-CM | POA: Diagnosis not present

## 2021-03-03 DIAGNOSIS — Z20822 Contact with and (suspected) exposure to covid-19: Secondary | ICD-10-CM | POA: Diagnosis not present

## 2021-03-04 DIAGNOSIS — K9409 Other complications of colostomy: Secondary | ICD-10-CM | POA: Diagnosis not present

## 2021-03-04 DIAGNOSIS — Z7982 Long term (current) use of aspirin: Secondary | ICD-10-CM | POA: Diagnosis not present

## 2021-03-04 DIAGNOSIS — Z20822 Contact with and (suspected) exposure to covid-19: Secondary | ICD-10-CM | POA: Diagnosis not present

## 2021-03-05 DIAGNOSIS — Z933 Colostomy status: Secondary | ICD-10-CM | POA: Diagnosis not present

## 2021-03-05 DIAGNOSIS — K631 Perforation of intestine (nontraumatic): Secondary | ICD-10-CM | POA: Diagnosis not present

## 2021-03-07 DIAGNOSIS — G4733 Obstructive sleep apnea (adult) (pediatric): Secondary | ICD-10-CM | POA: Diagnosis not present

## 2021-03-28 DIAGNOSIS — K631 Perforation of intestine (nontraumatic): Secondary | ICD-10-CM | POA: Diagnosis not present

## 2021-03-28 DIAGNOSIS — Z933 Colostomy status: Secondary | ICD-10-CM | POA: Diagnosis not present

## 2021-04-07 DIAGNOSIS — K631 Perforation of intestine (nontraumatic): Secondary | ICD-10-CM | POA: Diagnosis not present

## 2021-04-07 DIAGNOSIS — Z933 Colostomy status: Secondary | ICD-10-CM | POA: Diagnosis not present

## 2021-04-07 DIAGNOSIS — G4733 Obstructive sleep apnea (adult) (pediatric): Secondary | ICD-10-CM | POA: Diagnosis not present

## 2021-04-14 DIAGNOSIS — K631 Perforation of intestine (nontraumatic): Secondary | ICD-10-CM | POA: Diagnosis not present

## 2021-04-14 DIAGNOSIS — Z933 Colostomy status: Secondary | ICD-10-CM | POA: Diagnosis not present

## 2021-04-16 DIAGNOSIS — K9400 Colostomy complication, unspecified: Secondary | ICD-10-CM | POA: Diagnosis not present

## 2021-04-27 DIAGNOSIS — K631 Perforation of intestine (nontraumatic): Secondary | ICD-10-CM | POA: Diagnosis not present

## 2021-04-27 DIAGNOSIS — Z933 Colostomy status: Secondary | ICD-10-CM | POA: Diagnosis not present

## 2021-04-30 DIAGNOSIS — K631 Perforation of intestine (nontraumatic): Secondary | ICD-10-CM | POA: Diagnosis not present

## 2021-04-30 DIAGNOSIS — Z933 Colostomy status: Secondary | ICD-10-CM | POA: Diagnosis not present

## 2021-05-04 DIAGNOSIS — Z933 Colostomy status: Secondary | ICD-10-CM | POA: Diagnosis not present

## 2021-05-04 DIAGNOSIS — K631 Perforation of intestine (nontraumatic): Secondary | ICD-10-CM | POA: Diagnosis not present

## 2021-05-07 DIAGNOSIS — G4733 Obstructive sleep apnea (adult) (pediatric): Secondary | ICD-10-CM | POA: Diagnosis not present

## 2021-05-13 DIAGNOSIS — K631 Perforation of intestine (nontraumatic): Secondary | ICD-10-CM | POA: Diagnosis not present

## 2021-05-13 DIAGNOSIS — Z933 Colostomy status: Secondary | ICD-10-CM | POA: Diagnosis not present

## 2021-05-22 DIAGNOSIS — K631 Perforation of intestine (nontraumatic): Secondary | ICD-10-CM | POA: Diagnosis not present

## 2021-05-22 DIAGNOSIS — Z933 Colostomy status: Secondary | ICD-10-CM | POA: Diagnosis not present

## 2021-06-10 DIAGNOSIS — K631 Perforation of intestine (nontraumatic): Secondary | ICD-10-CM | POA: Diagnosis not present

## 2021-06-10 DIAGNOSIS — Z933 Colostomy status: Secondary | ICD-10-CM | POA: Diagnosis not present

## 2021-06-22 DIAGNOSIS — Z933 Colostomy status: Secondary | ICD-10-CM | POA: Diagnosis not present

## 2021-06-22 DIAGNOSIS — K631 Perforation of intestine (nontraumatic): Secondary | ICD-10-CM | POA: Diagnosis not present

## 2021-07-01 DIAGNOSIS — K631 Perforation of intestine (nontraumatic): Secondary | ICD-10-CM | POA: Diagnosis not present

## 2021-07-01 DIAGNOSIS — Z933 Colostomy status: Secondary | ICD-10-CM | POA: Diagnosis not present

## 2021-07-29 DIAGNOSIS — Z933 Colostomy status: Secondary | ICD-10-CM | POA: Diagnosis not present

## 2021-07-29 DIAGNOSIS — K631 Perforation of intestine (nontraumatic): Secondary | ICD-10-CM | POA: Diagnosis not present

## 2021-08-24 ENCOUNTER — Encounter: Payer: Self-pay | Admitting: Physician Assistant

## 2021-08-24 ENCOUNTER — Other Ambulatory Visit: Payer: Self-pay | Admitting: Physician Assistant

## 2021-08-24 ENCOUNTER — Other Ambulatory Visit: Payer: Self-pay

## 2021-08-24 ENCOUNTER — Ambulatory Visit (INDEPENDENT_AMBULATORY_CARE_PROVIDER_SITE_OTHER): Payer: Medicare HMO | Admitting: Physician Assistant

## 2021-08-24 VITALS — BP 126/75 | HR 54 | Temp 98.4°F | Ht 66.0 in | Wt 260.0 lb

## 2021-08-24 DIAGNOSIS — Z833 Family history of diabetes mellitus: Secondary | ICD-10-CM

## 2021-08-24 DIAGNOSIS — I10 Essential (primary) hypertension: Secondary | ICD-10-CM | POA: Diagnosis not present

## 2021-08-24 DIAGNOSIS — J45909 Unspecified asthma, uncomplicated: Secondary | ICD-10-CM

## 2021-08-24 DIAGNOSIS — Z0000 Encounter for general adult medical examination without abnormal findings: Secondary | ICD-10-CM

## 2021-08-24 NOTE — Patient Instructions (Signed)
Preventive Care 21-42 Years Old, Male Preventive care refers to lifestyle choices and visits with your health care provider that can promote health and wellness. This includes: A yearly physical exam. This is also called an annual wellness visit. Regular dental and eye exams. Immunizations. Screening for certain conditions. Healthy lifestyle choices, such as: Eating a healthy diet. Getting regular exercise. Not using drugs or products that contain nicotine and tobacco. Limiting alcohol use. What can I expect for my preventive care visit? Physical exam Your health care provider may check your: Height and weight. These may be used to calculate your BMI (body mass index). BMI is a measurement that tells if you are at a healthy weight. Heart rate and blood pressure. Body temperature. Skin for abnormal spots. Counseling Your health care provider may ask you questions about your: Past medical problems. Family's medical history. Alcohol, tobacco, and drug use. Emotional well-being. Home life and relationship well-being. Sexual activity. Diet, exercise, and sleep habits. Work and work environment. Access to firearms. What immunizations do I need?  Vaccines are usually given at various ages, according to a schedule. Your health care provider will recommend vaccines for you based on your age, medicalhistory, and lifestyle or other factors, such as travel or where you work. What tests do I need? Blood tests Lipid and cholesterol levels. These may be checked every 5 years starting at age 20. Hepatitis C test. Hepatitis B test. Screening  Diabetes screening. This is done by checking your blood sugar (glucose) after you have not eaten for a while (fasting). Genital exam to check for testicular cancer or hernias. STD (sexually transmitted disease) testing, if you are at risk. Talk with your health care provider about your test results, treatment options,and if necessary, the need for more  tests. Follow these instructions at home: Eating and drinking  Eat a healthy diet that includes fresh fruits and vegetables, whole grains, lean protein, and low-fat dairy products. Drink enough fluid to keep your urine pale yellow. Take vitamin and mineral supplements as recommended by your health care provider. Do not drink alcohol if your health care provider tells you not to drink. If you drink alcohol: Limit how much you have to 0-2 drinks a day. Be aware of how much alcohol is in your drink. In the [place] one drink equals one 12 oz bottle of beer (355 mL), one 5 oz glass of wine (148 mL), or one 1 oz glass of hard liquor (44 mL).  Lifestyle Take daily care of your teeth and gums. Brush your teeth every morning and night with fluoride toothpaste. Floss one time each day. Stay active. Exercise for at least 30 minutes 5 or more days each week. Do not use any products that contain nicotine or tobacco, such as cigarettes, e-cigarettes, and chewing tobacco. If you need help quitting, ask your health care provider. Do not use drugs. If you are sexually active, practice safe sex. Use a condom or other form of protection to prevent STIs (sexually transmitted infections). Find healthy ways to cope with stress, such as: Meditation, yoga, or listening to music. Journaling. Talking to a trusted person. Spending time with friends and family. Safety Always wear your seat belt while driving or riding in a vehicle. Do not drive: If you have been drinking alcohol. Do not ride with someone who has been drinking. When you are tired or distracted. While texting. Wear a helmet and other protective equipment during sports activities. If you have firearms in your house, make sure   you follow all gun safety procedures. Seek help if you have been physically or sexually abused. What's next? Go to your health care provider once a year for an annual wellness visit. Ask your health care provider how often  you should have your eyes and teeth checked. Stay up to date on all vaccines. This information is not intended to replace advice given to you by your health care provider. Make sure you discuss any questions you have with your healthcare provider. Document Revised: 08/29/2019 Document Reviewed: 12/07/2018 Elsevier Patient Education  2022 [employer]  

## 2021-08-24 NOTE — Progress Notes (Signed)
Male physical   Impression and Recommendations:    1. Healthcare maintenance   2. Moderate asthma without complication, unspecified whether persistent   3. Essential hypertension, benign   4. Morbid obesity (HCC)   5. Family history of diabetes mellitus      1) Anticipatory Guidance: Skin CA prevention- recommend to use sunscreen when outside along with skin surveillance; eat a balanced and modest diet; physical activity at least 25 minutes per day or minimum of 150 min/ week moderate to intense activity.  2) Immunizations / Screenings / Labs:   All immunizations are up-to-date per recommendations or will be updated today if pt allows.    - Patient understands with dental and vision screens they will schedule independently.  - Will obtain CBC, CMP, HgA1c, Lipid panel, TSH when fasting, if not already done past 12 mo/ recently.   3) Weight:  Continue to improve diet habits to improve overall feelings of well being and objective health data. Improve nutrient density of diet through increasing intake of fruits and vegetables and decreasing saturated fats, white flour products and refined sugars.   4) Healthcare Maintenance:  -Continue to follow up with various specialists. -Continue weight loss efforts with diet changes and walking regimen.  -Continue current medication regimen.  -Follow up in 4 months for asthma, wt   Orders Placed This Encounter  Procedures   CBC with Differential/Platelet    Standing Status:   Future    Standing Expiration Date:   09/24/2021   Hemoglobin A1c    Standing Status:   Future    Standing Expiration Date:   09/24/2021   Comprehensive metabolic panel    Standing Status:   Future    Standing Expiration Date:   09/24/2021    Order Specific Question:   Has the patient fasted?    Answer:   Yes   Lipid panel    Standing Status:   Future    Standing Expiration Date:   09/24/2021    Order Specific Question:   Has the patient fasted?    Answer:    Yes   TSH    Standing Status:   Future    Standing Expiration Date:   09/24/2021    No orders of the defined types were placed in this encounter.    Return in about 4 months (around 12/24/2021) for asthma, Wt.    Gross side effects, risk and benefits, and alternatives of medications discussed with patient.  Patient is aware that all medications have potential side effects and we are unable to predict every side effect or drug-drug interaction that may occur.  Expresses verbal understanding and consents to current therapy plan and treatment regimen.  Please see AVS handed out to patient at the end of our visit for further patient instructions/ counseling done pertaining to today's office visit.       Subjective:        CC: CPE   HPI: Jordan [name] is a 40 y.o. male who presents to [facility] at [facility] today for a yearly health maintenance exam.     Health Maintenance Summary  - Reviewed and updated, unless pt declines services.  Family history of Colon CA:  no Tobacco History Reviewed:   yes, former smoker Alcohol / drug use:   reduced use, <12 drinks/wk and occasional cannabinoid use Exercise Habits:  walking three times per week Dental Home: y  Eye exams: y Male history: STD concerns:  none Additional penile/ urinary concerns: followed by Urology   Additional concerns beyond Health Maintenance issues: none    Immunization History  Administered Date(s) Administered   Influenza Split 09/27/2011   Influenza,inj,Quad PF,6+ Mos 10/24/2014, 09/17/2015, 10/01/2016, 11/06/2018   Influenza-Unspecified 09/05/2017   Td 04/26/2009   Tdap 06/27/2019     Health Maintenance  Topic Date Due   COVID-19 Vaccine (1) Never done   Hepatitis C Screening  Never done   INFLUENZA VACCINE  07/27/2021   TETANUS/TDAP  06/26/2029   HIV Screening  Completed   Pneumococcal Vaccine 750-42 Years old  Aged Out   HPV VACCINES  Aged Out        Wt Readings from Last 3 Encounters:  08/24/21 260 lb (117.9 kg)  11/25/20 278 lb 3.2 oz (126.2 kg)  11/04/20 289 lb (131.1 kg)   BP Readings from Last 3 Encounters:  08/24/21 126/75  11/25/20 139/89  11/04/20 135/84   Pulse Readings from Last 3 Encounters:  08/24/21 (!) 54  11/25/20 86  11/04/20 80    Patient Active Problem List   Diagnosis Date Noted   Status post colostomy (HCC) 11/04/2020   History of diverticulitis of colon 09/09/2020   Asthma 10/01/2016   Morbid obesity (HCC) 02/06/2015   OSA (obstructive sleep apnea) 10/24/2014   Anxiety and depression 01/21/2010   CHRONIC PAIN SYNDROME 01/21/2010   HYPERTENSION, MILD 01/21/2010   Osteoarthritis 01/21/2010    Past Medical History:  Diagnosis Date   Anxiety and depression    Asthma    Chronic pain syndrome    History of kidney stones    HYPERTENSION, MILD    OSTEOARTHRITIS    Recurrent UTI    Sleep apnea     Past Surgical History:  Procedure Laterality Date   COLONOSCOPY WITH PROPOFOL N/A 09/30/2020   Procedure: COLONOSCOPY WITH PROPOFOL;  Surgeon: Midge MiniumWohl, Darren, MD;  Location[facility];  Service: Endoscopy;  Laterality: N/A;   JOINT REPLACEMENT     KNEE SURGERY Right    7 total surgeries   LYSIS OF ADHESION  10/15/2020   Procedure: LYSIS OF ADHESION;  Surgeon: Campbell Lernerodenberg, Denny, MD;  Location: [facility] ORS;  Service: General;;   REPLACEMENT TOTAL KNEE Right    XI ROBOT ASSISTED DIAGNOSTIC LAPAROSCOPY N/A 10/15/2020   Procedure: XI ROBOT ASSISTED DIAGNOSTIC LAPAROSCOPY, HARTMAN'S;  Surgeon: Campbell Lernerodenberg, Denny, MD;  Location: [facility] ORS;  Service: General;  Laterality: N/A;    Family History  Problem Relation Age of Onset   Heart disease Father    Heart attack Father    Diabetes Father    High Cholesterol Father    High blood pressure Father    Heart disease Paternal Grandfather    High Cholesterol Paternal Grandfather    Heart disease Paternal Grandmother    Heart attack Paternal Grandmother     Diabetes Paternal Grandmother    Heart disease Paternal Uncle    Cancer Paternal Uncle    Heart attack Paternal Uncle    Diabetes Paternal Uncle    High Cholesterol Paternal Uncle    Diabetes Paternal Aunt    Hypertension Maternal Uncle    Cancer Maternal Aunt        lung   Cancer Maternal Uncle        lung   Depression Mother    Dementia Mother     Social History   Substance and Sexual Activity  Drug Use Yes   Frequency: 7.0 times per week   Types: Marijuana  Comment: 09/28/20   ,  Social History   Substance and Sexual Activity  Alcohol Use Yes   Alcohol/week: 13.0 standard drinks   Types: 7 Standard drinks or equivalent, 6 Cans of beer per week  ,  Social History   Tobacco Use  Smoking Status Former   Types: Cigars   Quit date: 01/18/2014   Years since quitting: 7.6  Smokeless Tobacco Never  ,  Social History   Substance and Sexual Activity  Sexual Activity Yes    Patient's Medications  New Prescriptions   No medications on file  Previous Medications   ALBUTEROL (VENTOLIN HFA) 108 (90 BASE) MCG/ACT INHALER    Inhale 2 puffs into the lungs every 6 (six) hours as needed for wheezing or shortness of breath.   ASPIRIN 81 MG TABLET    Take 81 mg by mouth daily.    CETIRIZINE (ZYRTEC) 10 MG TABLET    Take 1 tablet (10 mg total) by mouth daily.  Modified Medications   No medications on file  Discontinued Medications   FUROSEMIDE (LASIX) 20 MG TABLET    Take 2 tablets (40 mg total) by mouth daily for 14 days.   GABAPENTIN (NEURONTIN) 300 MG CAPSULE    Take 1 capsule (300 mg total) by mouth 3 (three) times daily.   METHOCARBAMOL (ROBAXIN) 500 MG TABLET    Take 1 tablet (500 mg total) by mouth every 8 (eight) hours as needed for muscle spasms.   ONDANSETRON (ZOFRAN-ODT) 4 MG DISINTEGRATING TABLET    Take 1 tablet (4 mg total) by mouth every 6 (six) hours as needed for nausea.   OXYCODONE HCL 10 MG TABS    Take 1 tablet (10 mg total) by mouth every 6 (six) hours as  needed.   PHENAZOPYRIDINE (PYRIDIUM) 95 MG TABLET    Take 95 mg by mouth in the morning and at bedtime.     Butrans [buprenorphine hcl], Nucynta [tapentadol hydrochloride], Tapentadol hcl, Glucose polymer, Lactose intolerance (gi), Sumac, and Bactrim [sulfamethoxazole-trimethoprim]  Review of Systems: General:   Denies fever, chills, unexplained weight loss.  Optho/Auditory:   Denies visual changes, blurred vision/LOV Respiratory:   Denies SOB, DOE more than baseline levels.   Cardiovascular:   Denies chest pain, palpitations, new onset peripheral edema  Gastrointestinal:   Denies nausea, vomiting, diarrhea.  Genitourinary: Denies dysuria, flank pain, +frequency   Endocrine:     Denies hot or cold intolerance, polyphagia, polydipsia. Musculoskeletal:   Denies unexplained myalgias, joint swelling,  +arthralgias (bilateral knee pain) Skin:  Denies rash, suspicious lesions Neurological:     Denies dizziness, unexplained weakness, numbness  Psychiatric/Behavioral:   Denies mood changes, suicidal or homicidal ideations, hallucinations    Objective:     Blood pressure 126/75, pulse (!) 54, temperature 98.4 F (36.9 C), height 5\' 6"  (1.676 m), weight 260 lb (117.9 kg), SpO2 99 %. Body mass index is 41.97 kg/m. General Appearance:    Alert, cooperative, no distress, appears stated age  Head:    Normocephalic, without obvious abnormality, atraumatic  Eyes:    PERRL, conjunctiva/corneas clear, EOM's intact, fundi    benign, both eyes  Ears:    Normal TM's and external ear canals, both ears  Nose:   Nares normal, septum midline, mucosa normal, no drainage    or sinus tenderness  Throat:   Lips w/o lesion, mucosa moist, and tongue normal; teeth and gums normal  Neck:   Supple, symmetrical, trachea midline, no adenopathy;    thyroid:  no  enlargement/tenderness/nodules; no carotid   bruit or JVD  Back:     Symmetric, no curvature, ROM normal, no CVA tenderness  Lungs:     Clear to  auscultation bilaterally, respirations unlabored, no  Wh/ R/ R  Chest Wall:    No tenderness or gross deformity; normal excursion   Heart:    Regular rate and rhythm, S1 and S2 normal, no murmur, rub   or gallop  Abdomen:     Soft, tenderness at colostomy area, bowel sounds active all four quadrants, No G/R/R, no masses, +colostomy bag intact  Genitalia:   Deferred.   Rectal:   Deferred. Followed by Urology.  Extremities:   Extremities normal, atraumatic, no cyanosis, +edema  Pulses:   2+ and symmetric all extremities  Skin:   Warm, dry, Skin color, texture, turgor normal, no obvious rashes or lesions  M-Sk:   Ambulates * 4 w/o difficulty, no gross deformities, tone WNL  Neurologic:   CNII-XII grossly intact Psych:  No HI/SI, judgement and insight good, Euthymic mood. Full Affect.

## 2021-08-25 LAB — COMPREHENSIVE METABOLIC PANEL WITH GFR
ALT: 13 [IU]/L (ref 0–44)
AST: 16 [IU]/L (ref 0–40)
Albumin/Globulin Ratio: 2 (ref 1.2–2.2)
Albumin: 4.5 g/dL (ref 4.0–5.0)
Alkaline Phosphatase: 74 [IU]/L (ref 44–121)
BUN/Creatinine Ratio: 9 (ref 9–20)
BUN: 8 mg/dL (ref 6–24)
Bilirubin Total: 0.4 mg/dL (ref 0.0–1.2)
CO2: 25 mmol/L (ref 20–29)
Calcium: 9.2 mg/dL (ref 8.7–10.2)
Chloride: 101 mmol/L (ref 96–106)
Creatinine, Ser: 0.86 mg/dL (ref 0.76–1.27)
Globulin, Total: 2.3 g/dL (ref 1.5–4.5)
Glucose: 96 mg/dL (ref 65–99)
Potassium: 4.9 mmol/L (ref 3.5–5.2)
Sodium: 140 mmol/L (ref 134–144)
Total Protein: 6.8 g/dL (ref 6.0–8.5)
eGFR: 112 mL/min/{1.73_m2}

## 2021-08-25 LAB — CBC WITH DIFFERENTIAL/PLATELET
Basophils Absolute: 0.1 10*3/uL (ref 0.0–0.2)
Basos: 1 %
EOS (ABSOLUTE): 0.2 10*3/uL (ref 0.0–0.4)
Eos: 2 %
Hematocrit: 43.9 % (ref 37.5–51.0)
Hemoglobin: 13.8 g/dL (ref 13.0–17.7)
Immature Grans (Abs): 0.1 10*3/uL (ref 0.0–0.1)
Immature Granulocytes: 1 %
Lymphocytes Absolute: 2.4 10*3/uL (ref 0.7–3.1)
Lymphs: 39 %
MCH: 26.1 pg — ABNORMAL LOW (ref 26.6–33.0)
MCHC: 31.4 g/dL — ABNORMAL LOW (ref 31.5–35.7)
MCV: 83 fL (ref 79–97)
Monocytes Absolute: 0.6 10*3/uL (ref 0.1–0.9)
Monocytes: 10 %
Neutrophils Absolute: 2.9 10*3/uL (ref 1.4–7.0)
Neutrophils: 47 %
Platelets: 317 10*3/uL (ref 150–450)
RBC: 5.28 x10E6/uL (ref 4.14–5.80)
RDW: 13.9 % (ref 11.6–15.4)
WBC: 6.2 10*3/uL (ref 3.4–10.8)

## 2021-08-25 LAB — LIPID PANEL
Chol/HDL Ratio: 2.5 ratio (ref 0.0–5.0)
Cholesterol, Total: 189 mg/dL (ref 100–199)
HDL: 75 mg/dL
LDL Chol Calc (NIH): 101 mg/dL — ABNORMAL HIGH (ref 0–99)
Triglycerides: 71 mg/dL (ref 0–149)
VLDL Cholesterol Cal: 13 mg/dL (ref 5–40)

## 2021-08-25 LAB — HEMOGLOBIN A1C
Est. average glucose Bld gHb Est-mCnc: 111 mg/dL
Hgb A1c MFr Bld: 5.5 % (ref 4.8–5.6)

## 2021-08-25 LAB — TSH: TSH: 1.78 u[IU]/mL (ref 0.450–4.500)

## 2021-09-11 DIAGNOSIS — K631 Perforation of intestine (nontraumatic): Secondary | ICD-10-CM | POA: Diagnosis not present

## 2021-09-11 DIAGNOSIS — Z933 Colostomy status: Secondary | ICD-10-CM | POA: Diagnosis not present

## 2021-12-23 ENCOUNTER — Encounter: Payer: Self-pay | Admitting: Physician Assistant

## 2021-12-23 ENCOUNTER — Other Ambulatory Visit: Payer: Self-pay

## 2021-12-23 ENCOUNTER — Ambulatory Visit (INDEPENDENT_AMBULATORY_CARE_PROVIDER_SITE_OTHER): Payer: Medicare HMO | Admitting: Physician Assistant

## 2021-12-23 VITALS — BP 135/83 | HR 64 | Temp 98.4°F | Ht 66.0 in | Wt 256.0 lb

## 2021-12-23 DIAGNOSIS — J45909 Unspecified asthma, uncomplicated: Secondary | ICD-10-CM | POA: Diagnosis not present

## 2021-12-23 DIAGNOSIS — F32A Depression, unspecified: Secondary | ICD-10-CM | POA: Diagnosis not present

## 2021-12-23 DIAGNOSIS — I10 Essential (primary) hypertension: Secondary | ICD-10-CM | POA: Diagnosis not present

## 2021-12-23 DIAGNOSIS — F419 Anxiety disorder, unspecified: Secondary | ICD-10-CM | POA: Diagnosis not present

## 2021-12-23 MED ORDER — ALBUTEROL SULFATE HFA 108 (90 BASE) MCG/ACT IN AERS: 2.0000 | INHALATION_SPRAY | Freq: Four times a day (QID) | RESPIRATORY_TRACT | 2 refills | Status: DC | PRN

## 2021-12-23 NOTE — Progress Notes (Signed)
Established Patient Office Visit  Subjective:  Patient ID: Jordan [name], male    DOB: Nov 17, 1981  Age: 40 y.o. MRN: [id number]  CC:  Chief Complaint  Patient presents with   Follow-up    Weight    Asthma    HPI Jordan [name] presents for follow up on asthma, hypertension and weight. Patient reports has worked on losing weight by reducing his calories and eating more vegetables. Is working on cutting back meats. Patient wants to reach weight goal of 230 pounds.  Asthma: Uses albuterol daily at least once a day in the morning. Denies waking up at night to use inhaler. Denies wheezing.  Reports in the past was on Symbicort which did not work well for him.  Mood: Patient reports meditation helps with anxiety. Patient states did have a therapist but has not seen them for quite some time. Going outside also helps with his mood. Denies SI/HI.  Past Medical History:  Diagnosis Date   Anxiety and depression    Asthma    Chronic pain syndrome    History of kidney stones    HYPERTENSION, MILD    OSTEOARTHRITIS    Recurrent UTI    Sleep apnea     Past Surgical History:  Procedure Laterality Date   COLONOSCOPY WITH PROPOFOL N/A 09/30/2020   Procedure: COLONOSCOPY WITH PROPOFOL;  Surgeon: Lucilla Lame, MD;  Location: [facility];  Service: Endoscopy;  Laterality: N/A;   JOINT REPLACEMENT     KNEE SURGERY Right    7 total surgeries   LYSIS OF ADHESION  10/15/2020   Procedure: LYSIS OF ADHESION;  Surgeon: Ronny Bacon, MD;  Location: [facility] ORS;  Service: General;;   REPLACEMENT TOTAL KNEE Right    XI ROBOT ASSISTED DIAGNOSTIC LAPAROSCOPY N/A 10/15/2020   Procedure: XI ROBOT ASSISTED DIAGNOSTIC LAPAROSCOPY, HARTMAN'S;  Surgeon: Ronny Bacon, MD;  Location: [facility] ORS;  Service: General;  Laterality: N/A;    Family History  Problem Relation Age of Onset   Heart disease Father    Heart attack Father    Diabetes Father    High Cholesterol  Father    High blood pressure Father    Heart disease Paternal Grandfather    High Cholesterol Paternal Grandfather    Heart disease Paternal Grandmother    Heart attack Paternal Grandmother    Diabetes Paternal Grandmother    Heart disease Paternal Uncle    Cancer Paternal Uncle    Heart attack Paternal Uncle    Diabetes Paternal Uncle    High Cholesterol Paternal Uncle    Diabetes Paternal Aunt    Hypertension Maternal Uncle    Cancer Maternal Aunt        lung   Cancer Maternal Uncle        lung   Depression Mother    Dementia Mother     Social History   Socioeconomic History   Marital status: Married    Spouse name: Public affairs consultant   Number of children: 2   Years of education: some college   Highest education level: Not on file  Occupational History    Employer: UNEMPLOYED    Comment: Disabled  Tobacco Use   Smoking status: Former    Types: Cigars    Quit date: 01/18/2014    Years since quitting: 7.9   Smokeless tobacco: Never  Vaping Use   Vaping Use: Never used  Substance and Sexual Activity   Alcohol use: Yes    Alcohol/week: 13.0 standard  drinks    Types: 7 Standard drinks or equivalent, 6 Cans of beer per week   Drug use: Yes    Frequency: 7.0 times per week    Types: Marijuana    Comment: 09/28/20    Sexual activity: Yes  Other Topics Concern   Not on file  Social History Narrative   Caffeine Use:  6 beverages daily   Regular exercise:  2 x weekly         Social Determinants of Health   Financial Resource Strain: Not on file  Food Insecurity: Not on file  Transportation Needs: Not on file  Physical Activity: Not on file  Stress: Not on file  Social Connections: Not on file  Intimate Partner Violence: Not on file    Outpatient Medications Prior to Visit  Medication Sig Dispense Refill   aspirin 81 MG tablet Take 81 mg by mouth daily.      cetirizine (ZYRTEC) 10 MG tablet Take 1 tablet (10 mg total) by mouth daily. 90 tablet 0    albuterol (VENTOLIN HFA) 108 (90 Base) MCG/ACT inhaler Inhale 2 puffs into the lungs every 6 (six) hours as needed for wheezing or shortness of breath. 18 g 2   No facility-administered medications prior to visit.    Allergies  Allergen Reactions   Butrans [Buprenorphine Hcl]     Difficulty breathing, n/v   Nucynta [Tapentadol Hydrochloride] Anaphylaxis   Tapentadol Hcl Anaphylaxis   Glucose Polymer Other (See Comments)    poison poison poison   Lactose Intolerance (Gi)    [place]     poison   Bactrim [Sulfamethoxazole-Trimethoprim] Itching and Rash    ROS Review of Systems A fourteen system review of systems was performed and found to be positive as per HPI.   Objective:    Physical Exam General:  Well Developed, well nourished, appropriate for stated age.  Neuro:  Alert and oriented,  extra-ocular muscles intact  HEENT:  Normocephalic, atraumatic, neck supple Skin:  no gross rash, warm, pink. Cardiac:  RRR, S1 S2 Respiratory: CTA B/L w/o wheezing with slight dec air movement at lung bases Vascular:  Ext warm, no cyanosis apprec.; cap RF less 2 sec. Psych:  No HI/SI, judgement and insight good, Euthymic mood. Full Affect.  BP 135/83    Pulse 64    Temp 98.4 F (36.9 C)    Ht '5\' 6"'  (1.676 m)    Wt 256 lb (116.1 kg)    SpO2 99%    BMI 41.32 kg/m  Wt Readings from Last 3 Encounters:  12/23/21 256 lb (116.1 kg)  08/24/21 260 lb (117.9 kg)  11/25/20 278 lb 3.2 oz (126.2 kg)     Health Maintenance Due  Topic Date Due   COVID-19 Vaccine (1) Never done   Pneumococcal Vaccine 5-40 Years old (1 - PCV) Never done   Hepatitis C Screening  Never done   INFLUENZA VACCINE  07/27/2021    There are no preventive care reminders to display for this patient.  Lab Results  Component Value Date   TSH 1.780 08/24/2021   Lab Results  Component Value Date   WBC 6.2 08/24/2021   HGB 13.8 08/24/2021   HCT 43.9 08/24/2021   MCV 83 08/24/2021   PLT 317 08/24/2021   Lab Results   Component Value Date   NA 140 08/24/2021   K 4.9 08/24/2021   CO2 25 08/24/2021   GLUCOSE 96 08/24/2021   BUN 8 08/24/2021   CREATININE 0.86 08/24/2021  BILITOT 0.4 08/24/2021   ALKPHOS 74 08/24/2021   AST 16 08/24/2021   ALT 13 08/24/2021   PROT 6.8 08/24/2021   ALBUMIN 4.5 08/24/2021   CALCIUM 9.2 08/24/2021   ANIONGAP 8 10/22/2020   EGFR 112 08/24/2021   GFR 110.24 06/04/2015   Lab Results  Component Value Date   CHOL 189 08/24/2021   Lab Results  Component Value Date   HDL 75 08/24/2021   Lab Results  Component Value Date   LDLCALC 101 (H) 08/24/2021   Lab Results  Component Value Date   TRIG 71 08/24/2021   Lab Results  Component Value Date   CHOLHDL 2.5 08/24/2021   Lab Results  Component Value Date   HGBA1C 5.5 08/24/2021   Depression screen [facility] 2/9 12/23/2021 08/24/2021 08/27/2020 07/02/2020  Decreased Interest 1 0 0 1  Down, Depressed, Hopeless 1 0 0 1  PHQ - 2 Score 2 0 0 2  Altered sleeping 0 '2 3 3  ' Tired, decreased energy '1 1 1 1  ' Change in appetite 0 0 0 0  Feeling bad or failure about yourself  0 0 0 0  Trouble concentrating 1 0 0 1  Moving slowly or fidgety/restless 0 0 0 0  Suicidal thoughts 0 0 0 0  PHQ-9 Score '4 3 4 7  ' Difficult doing work/chores Somewhat difficult Somewhat difficult Somewhat difficult Very difficult   GAD 7 : Generalized Anxiety Score 12/23/2021 08/24/2021 07/02/2020  Nervous, Anxious, on Edge '1 1 1  ' Control/stop worrying '1 1 1  ' Worry too much - different things '1 1 1  ' Trouble relaxing '1 1 1  ' Restless 0 0 0  Easily annoyed or irritable '1 1 1  ' Afraid - awful might happen 0 0 2  Total GAD 7 Score '5 5 7  ' Anxiety Difficulty Somewhat difficult Somewhat difficult Very difficult       Assessment & Plan:   Problem List Items Addressed This Visit       Cardiovascular and Mediastinum   HYPERTENSION, MILD    -Stable. -Patient managing without medication.  Encouraged to continue weight loss efforts and follow low-sodium  diet. -Will continue to monitor.        Respiratory   Asthma - Primary    - Discussed with patient treatment adjustments such as starting ICS-LABA to reduce use of SABA and patient declines at this time. States prefers to resume montelukast which he has at home. If symptoms fail to improve or worsen recommend to consider starting ICS-LABA such as Dulera. Recommend to monitor for exacerbation symptoms.   -Will continue to monitor.      Relevant Medications   albuterol (VENTOLIN HFA) 108 (90 Base) MCG/ACT inhaler     Other   Anxiety and depression   Morbid obesity ([place])    -Associated with hypertension and elevated LDL cholesterol level. -Encourage to continue weight loss efforts with dietary and lifestyle changes. Discussed with patient referral to [facility] and/or nutritionist, reports not interested at this time. -Will continue to monitor.       Anxiety and depression: -Stable. -Recommend to continue with mindfulness therapy and follow up with therapist. -Will continue to monitor.  Meds ordered this encounter  Medications   albuterol (VENTOLIN HFA) 108 (90 Base) MCG/ACT inhaler    Sig: Inhale 2 puffs into the lungs every 6 (six) hours as needed for wheezing or shortness of breath.    Dispense:  18 g    Refill:  2    Follow-up: Return  in about 3 months (around 03/23/2022) for Wt, Asthma.   Note:  This note was prepared with assistance of Dragon voice recognition software. Occasional wrong-word or sound-a-like substitutions may have occurred due to the inherent limitations of voice recognition software.   Lorrene Reid, PA-C

## 2021-12-23 NOTE — Assessment & Plan Note (Signed)
-  Stable. -Patient managing without medication.  Encouraged to continue weight loss efforts and follow low-sodium diet. -Will continue to monitor.

## 2021-12-23 NOTE — Assessment & Plan Note (Signed)
-  Associated with hypertension and elevated LDL cholesterol level. -Encourage to continue weight loss efforts with dietary and lifestyle changes. Discussed with patient referral to [facility] and/or nutritionist, reports not interested at this time. -Will continue to monitor.

## 2021-12-23 NOTE — Patient Instructions (Addendum)
Mediterranean Diet °A Mediterranean diet refers to food and lifestyle choices that are based on the traditions of countries located on the [place]. It focuses on eating more fruits, vegetables, whole grains, beans, nuts, seeds, and heart-healthy fats, and eating less dairy, meat, eggs, and processed foods with added sugar, salt, and fat. This way of eating has been shown to help prevent certain conditions and improve outcomes for people who have chronic diseases, like kidney disease and heart disease. °What are tips for following this plan? °Reading food labels °Check the serving size of packaged foods. For foods such as rice and pasta, the serving size refers to the amount of cooked product, not dry. °Check the total fat in packaged foods. Avoid foods that have saturated fat or trans fats. °Check the ingredient list for added sugars, such as corn syrup. °Shopping ° °Buy a variety of foods that offer a balanced diet, including: °Fresh fruits and vegetables (produce). °Grains, beans, nuts, and seeds. Some of these may be available in unpackaged forms or large amounts (in bulk). °Fresh seafood. °Poultry and eggs. °Low-fat dairy products. °Buy whole ingredients instead of prepackaged foods. °Buy fresh fruits and vegetables in-season from local farmers markets. °Buy plain frozen fruits and vegetables. °If you do not have access to quality fresh seafood, buy precooked frozen shrimp or canned fish, such as tuna, salmon, or sardines. °Stock your pantry so you always have certain foods on hand, such as olive oil, canned tuna, canned tomatoes, rice, pasta, and beans. °Cooking °Cook foods with extra-virgin olive oil instead of using butter or other vegetable oils. °Have meat as a side dish, and have vegetables or grains as your main dish. This means having meat in small portions or adding small amounts of meat to foods like pasta or stew. °Use beans or vegetables instead of meat in common dishes like chili or  lasagna. °Experiment with different cooking methods. Try roasting, broiling, steaming, and sautéing vegetables. °Add frozen vegetables to soups, stews, pasta, or rice. °Add nuts or seeds for added healthy fats and plant protein at each meal. You can add these to yogurt, salads, or vegetable dishes. °Marinate fish or vegetables using olive oil, lemon juice, garlic, and fresh herbs. °Meal planning °Plan to eat one vegetarian meal one day each week. Try to work up to two vegetarian meals, if possible. °Eat seafood two or more times a week. °Have healthy snacks readily available, such as: °Vegetable sticks with hummus. °Greek yogurt. °Fruit and nut trail mix. °Eat balanced meals throughout the week. This includes: °Fruit: 2-3 servings a day. °Vegetables: 4-5 servings a day. °Low-fat dairy: 2 servings a day. °Fish, poultry, or lean meat: 1 serving a day. °Beans and legumes: 2 or more servings a week. °Nuts and seeds: 1-2 servings a day. °Whole grains: 6-8 servings a day. °Extra-virgin olive oil: 3-4 servings a day. °Limit red meat and sweets to only a few servings a month. °Lifestyle ° °Cook and eat meals together with your family, when possible. °Drink enough fluid to keep your urine pale yellow. °Be physically active every day. This includes: °Aerobic exercise like running or swimming. °Leisure activities like gardening, walking, or housework. °Get 7-8 hours of sleep each night. °If recommended by your health care provider, drink red wine in moderation. This means 1 glass a day for nonpregnant women and 2 glasses a day for men. A glass of wine equals 5 oz (150 mL). °What foods should I eat? °Fruits °Apples. Apricots. Avocado. Berries. Bananas. Cherries. Dates.   Figs. Grapes. Lemons. Melon. Oranges. Peaches. Plums. Pomegranate. Vegetables Artichokes. Beets. Broccoli. Cabbage. Carrots. Eggplant. Green beans. Chard. Kale. Spinach. Onions. Leeks. Peas. Squash. Tomatoes. Peppers. Radishes. Grains Whole-grain pasta. Brown  rice. Bulgur wheat. Polenta. Couscous. Whole-wheat bread. Orpah Cobb. Meats and other proteins Beans. Almonds. Sunflower seeds. Pine nuts. Peanuts. Cod. Salmon. Scallops. Shrimp. Tuna. Tilapia. Clams. Oysters. Eggs. Poultry without skin. Dairy Low-fat milk. Cheese. Greek yogurt. Fats and oils Extra-virgin olive oil. Avocado oil. Grapeseed oil. Beverages Water. Red wine. Herbal tea. Sweets and desserts Greek yogurt with honey. Baked apples. Poached pears. Trail mix. Seasonings and condiments Basil. Cilantro. Coriander. Cumin. Mint. Parsley. Sage. Rosemary. Tarragon. Garlic. Oregano. Thyme. Pepper. Balsamic vinegar. Tahini. Hummus. Tomato sauce. Olives. Mushrooms. The items listed above may not be a complete list of foods and beverages you can eat. Contact a dietitian for more information. What foods should I limit? This is a list of foods that should be eaten rarely or only on special occasions. Fruits Fruit canned in syrup. Vegetables Deep-fried potatoes (french fries). Grains Prepackaged pasta or rice dishes. Prepackaged cereal with added sugar. Prepackaged snacks with added sugar. Meats and other proteins Beef. Pork. Lamb. Poultry with skin. Hot dogs. Tomasa Blase. Dairy Ice cream. Sour cream. Whole milk. Fats and oils Butter. Canola oil. Vegetable oil. Beef fat (tallow). Lard. Beverages Juice. Sugar-sweetened soft drinks. Beer. Liquor and spirits. Sweets and desserts Cookies. Cakes. Pies. Candy. Seasonings and condiments Mayonnaise. Pre-made sauces and marinades. The items listed above may not be a complete list of foods and beverages you should limit. Contact a dietitian for more information. Summary The Mediterranean diet includes both food and lifestyle choices. Eat a variety of fresh fruits and vegetables, beans, nuts, seeds, and whole grains. Limit the amount of red meat and sweets that you eat. If recommended by your health care provider, drink red wine in moderation.  This means 1 glass a day for nonpregnant women and 2 glasses a day for men. A glass of wine equals 5 oz (150 mL). This information is not intended to replace advice given to you by your health care provider. Make sure you discuss any questions you have with your health care provider. Document Revised: 01/18/2020 Document Reviewed: 11/15/2019 Elsevier Patient Education  2022 [employer] Inc[employer]    Asthma, Adult Asthma is a long-term (chronic) condition in which the airways get tight and narrow. The airways are the breathing passages that lead from the nose and mouth down into the lungs. A person with asthma will have times when symptoms get worse. These are called asthma attacks. They can cause coughing, whistling sounds when you breathe (wheezing), shortness of breath, and chest pain. They can make it hard to breathe. There is no cure for asthma, but medicines and lifestyle changes can help control it. There are many things that can bring on an asthma attack or make asthma symptoms worse (triggers). Common triggers include: Mold. Dust. Cigarette smoke. Cockroaches. Things that can cause allergy symptoms (allergens). These include animal skin flakes (dander) and pollen from trees or grass. Things that pollute the air. These may include household cleaners, wood smoke, smog, or chemical odors. Cold air, weather changes, and wind. Crying or laughing hard. Stress. Certain medicines or drugs. Certain foods such as dried fruit, potato chips, and grape juice. Infections, such as a cold or the flu. Certain medical conditions or diseases. Exercise or tiring activities. Asthma may be treated with medicines and by staying away from the things that cause asthma attacks. Types of medicines  may include: Controller medicines. These help prevent asthma symptoms. They are usually taken every day. Fast-acting reliever or rescue medicines. These quickly relieve asthma symptoms. They are used as needed and provide  short-term relief. Allergy medicines if your attacks are brought on by allergens. Medicines to help control the body's defense (immune) system. Follow these instructions at home: Avoiding triggers in your home Change your heating and air conditioning filter often. Limit your use of fireplaces and wood stoves. Get rid of pests (such as roaches and mice) and their droppings. Throw away plants if you see mold on them. Clean your floors. Dust regularly. Use cleaning products that do not smell. Have someone vacuum when you are not home. Use a vacuum cleaner with a HEPA filter if possible. Replace carpet with wood, tile, or vinyl flooring. Carpet can trap animal skin flakes and dust. Use allergy-proof pillows, mattress covers, and box spring covers. Wash bed sheets and blankets every week in hot water. Dry them in a dryer. Keep your bedroom free of any triggers. Avoid pets and keep windows closed when things that cause allergy symptoms are in the air. Use blankets that are made of polyester or cotton. Clean bathrooms and kitchens with bleach. If possible, have someone repaint the walls in these rooms with mold-resistant paint. Keep out of the rooms that are being cleaned and painted. Wash your hands often with soap and water. If soap and water are not available, use hand sanitizer. Do not allow anyone to smoke in your home. General instructions Take over-the-counter and prescription medicines only as told by your doctor. Talk with your doctor if you have questions about how or when to take your medicines. Make note if you need to use your medicines more often than usual. Do not use any products that contain nicotine or tobacco, such as cigarettes and e-cigarettes. If you need help quitting, ask your doctor. Stay away from secondhand smoke. Avoid doing things outdoors when allergen counts are high and when air quality is low. Wear a ski mask when doing outdoor activities in the winter. The mask  should cover your nose and mouth. Exercise indoors on cold days if you can. Warm up before you exercise. Take time to cool down after exercise. Use a peak flow meter as told by your doctor. A peak flow meter is a tool that measures how well the lungs are working. Keep track of the peak flow meter's readings. Write them down. Follow your asthma action plan. This is a written plan for taking care of your asthma and treating your attacks. Make sure you get all the shots (vaccines) that your doctor recommends. Ask your doctor about a flu shot and a pneumonia shot. Keep all follow-up visits as told by your doctor. This is important. Contact a doctor if: You have wheezing, shortness of breath, or a cough even while taking medicine to prevent attacks. The mucus you cough up (sputum) is thicker than usual. The mucus you cough up changes from clear or white to yellow, green, gray, or bloody. You have problems from the medicine you are taking, such as: A rash. Itching. Swelling. Trouble breathing. You need reliever medicines more than 2-3 times a week. Your peak flow reading is still at 50-79% of your personal best after following the action plan for 1 hour. You have a fever. Get help right away if: You seem to be worse and are not responding to medicine during an asthma attack. You are short of breath even at  rest. You get short of breath when doing very little activity. You have trouble eating, drinking, or talking. You have chest pain or tightness. You have a fast heartbeat. Your lips or fingernails start to turn blue. You are light-headed or dizzy, or you faint. Your peak flow is less than 50% of your personal best. You feel too tired to breathe normally. Summary Asthma is a long-term (chronic) condition in which the airways get tight and narrow. An asthma attack can make it hard to breathe. Asthma cannot be cured, but medicines and lifestyle changes can help control it. Make sure you  understand how to avoid triggers and how and when to use your medicines. This information is not intended to replace advice given to you by your health care provider. Make sure you discuss any questions you have with your health care provider. Document Revised: 04/06/2020 Document Reviewed: 04/16/2020 Elsevier Patient Education  2022 [employer].

## 2021-12-23 NOTE — Assessment & Plan Note (Signed)
-   Discussed with patient treatment adjustments such as starting ICS-LABA to reduce use of SABA and patient declines at this time. States prefers to resume montelukast which he has at home. If symptoms fail to improve or worsen recommend to consider starting ICS-LABA such as Dulera. Recommend to monitor for exacerbation symptoms.   -Will continue to monitor.

## 2022-03-22 NOTE — Progress Notes (Signed)
? ?  ? ? ?Telehealth office visit note for Jordan Masker, PA-C- at Primary Care at [facility] ?  ?I connected with current patient today by telephone and verified that I am speaking with the correct person   ? Location of the patient: Home ? Location of the provider: Office ?- This visit type was conducted due to national recommendations for restrictions regarding the COVID-19 Pandemic (e.g. social distancing) in an effort to limit this patient's exposure and mitigate transmission in our community.    ?- No physical exam could be performed with this format, beyond that communicated to [place] by the patient/ family members as noted.   ?- Additionally my office staff/ schedulers were to discuss with the patient that there may be a monetary charge related to this service, depending on their medical insurance.  My understanding is that patient understood and consented to proceed.   ?  ?_________________________________________________________________________________ ? ? ?History of Present Illness: ?Patient calls in for follow up on asthma and weight. Patient has been working on weight loss by changing his diet. States is following a Mediterranean diet and has reduced sugar and bread intake. Tries to walk 1.5 mile daily but his knees will limit his activity if they are hurting.  ? ?Asthma: Using albuterol inhaler few days per week. States he last used inhaler 3 days ago. Has reduced foods that produce mucus which has helped with his asthma. No severe wheezing or shortness of breath. Has been taking his daily Zyrtec but did not restart montelukast.  ? ? ? ? ? ?  12/23/2021  ?  9:44 AM 08/24/2021  ?  9:25 AM 07/02/2020  ?  2:39 PM  ?GAD 7 : Generalized Anxiety Score  ?Nervous, Anxious, on Edge 1 1 1   ?Control/stop worrying 1 1 1   ?Worry too much - different things 1 1 1   ?Trouble relaxing 1 1 1   ?Restless 0 0 0  ?Easily annoyed or irritable 1 1 1   ?Afraid - awful might happen 0 0 2  ?Total GAD 7 Score 5 5 7   ?Anxiety  Difficulty Somewhat difficult Somewhat difficult Very difficult  ? ? ? ?  12/23/2021  ?  9:43 AM 08/24/2021  ?  9:25 AM 08/27/2020  ?  2:33 PM 07/02/2020  ?  2:38 PM  ?Depression screen PHQ 2/9  ?Decreased Interest 1 0 0 1  ?Down, Depressed, Hopeless 1 0 0 1  ?PHQ - 2 Score 2 0 0 2  ?Altered sleeping 0 2 3 3   ?Tired, decreased energy 1 1 1 1   ?Change in appetite 0 0 0 0  ?Feeling bad or failure about yourself  0 0 0 0  ?Trouble concentrating 1 0 0 1  ?Moving slowly or fidgety/restless 0 0 0 0  ?Suicidal thoughts 0 0 0 0  ?PHQ-9 Score 4 3 4 7   ?Difficult doing work/chores Somewhat difficult Somewhat difficult Somewhat difficult Very difficult  ? ? ? ? ?Impression and Recommendations:   ? ? ?1. Moderate asthma without complication, unspecified whether persistent   ?2. Morbid obesity (HCC)   ?3. Seasonal allergies   ?  ?Moderate asthma without complication, unspecified, whether persistent: ?-Improved. Continue albuterol as needed. Continue oral antihistamine. If starts experiencing increased wheezing or shortness of breath recommend to resume montelukast. Recommend to continue to with diet changes and weight loss efforts.  ? ?Morbid obesity: ?-Associated with hyperlipidemia and prediabetes. ?-Encourage to continue diet and lifestyle changes. Patient not interested on referral to nutritionist or MWM.  Discussed Weight Watcher's, My Fitness Pal or Lose It app. ? ?Seasonal allergies: ?-Stable. ?-Continue Zyrtec. ?-Will continue to monitor. ? ?- As part of my medical decision making, I reviewed the following data within the electronic MEDICAL RECORD NUMBER History obtained from pt /family, CMA notes reviewed and incorporated if applicable, Labs reviewed, Radiograph/ tests reviewed if applicable and OV notes from prior OV's with me, as well as any other specialists she/he has seen since seeing me last, were all reviewed and used in my medical decision making process today.   ? ?- Additionally, when appropriate, discussion had with  patient regarding our treatment plan, and their biases/concerns about that plan were used in my medical decision making today.   ? ?- The patient agreed with the plan and demonstrated an understanding of the instructions.   No barriers to understanding were identified.  ?   ?- The patient was advised to call back or seek an in-person evaluation if the symptoms worsen or if the condition fails to improve as anticipated. ? ? ?Return in about 5 months (around 08/23/2022) for CPE and FBW.  ? ? ?No orders of the defined types were placed in this encounter. ? ? ?Meds ordered this encounter  ?Medications  ? albuterol (VENTOLIN HFA) 108 (90 Base) MCG/ACT inhaler  ?  Sig: Inhale 2 puffs into the lungs every 6 (six) hours as needed for wheezing or shortness of breath.  ?  Dispense:  18 g  ?  Refill:  2  ? cetirizine (ZYRTEC) 10 MG tablet  ?  Sig: Take 1 tablet (10 mg total) by mouth daily.  ?  Dispense:  90 tablet  ?  Refill:  1  ? ? ?Medications Discontinued During This Encounter  ?Medication Reason  ? cetirizine (ZYRTEC) 10 MG tablet Reorder  ? albuterol (VENTOLIN HFA) 108 (90 Base) MCG/ACT inhaler Reorder  ?  ? ? ? ?Time spent on telephone encounter was 7 minutes. ? ? ? ? ? ?The 21st Century Cures Act was signed into law in 2016 which includes the topic of electronic health records.  This provides immediate access to information in MyChart.  This includes consultation notes, operative notes, office notes, lab results and pathology reports.  If you have any questions about what you read please let us know at your next visit or call us at the office.  We are right here with you. ? ? ?__________________________________________________________________________________ ? ? ? ? ?Patient Care Team  ?  Relationship Specialty Notifications Start End  ?Jordan Masker, PA-C PCP - General Physician Assistant  07/02/20   ?Currie Paris, MD Referring Physician Orthopedic Surgery  09/16/15   ?Oretha Milch, MD Consulting Physician  Pulmonary Disease  09/16/15   ? ? ? ?-Vitals obtained; medications/ allergies reconciled;  personal medical, social, Sx etc.histories were updated by CMA, reviewed by me and are reflected in chart ? ? ?Patient Active Problem List  ? Diagnosis Date Noted  ? Colostomy present (HCC) 11/28/2020  ? Other complications of colostomy (HCC) 11/28/2020  ? Status post colostomy (HCC) 11/04/2020  ? History of diverticulitis of colon 09/09/2020  ? Asthma 10/01/2016  ? Morbid obesity (HCC) 02/06/2015  ? OSA (obstructive sleep apnea) 10/24/2014  ? Anxiety and depression 01/21/2010  ? CHRONIC PAIN SYNDROME 01/21/2010  ? HYPERTENSION, MILD 01/21/2010  ? Osteoarthritis 01/21/2010  ? ? ? ?Current Meds  ?Medication Sig  ? aspirin 81 MG tablet Take 81 mg by mouth daily.   ? [DISCONTINUED] albuterol (VENTOLIN HFA)  108 (90 Base) MCG/ACT inhaler Inhale 2 puffs into the lungs every 6 (six) hours as needed for wheezing or shortness of breath.  ? [DISCONTINUED] cetirizine (ZYRTEC) 10 MG tablet Take 1 tablet (10 mg total) by mouth daily.  ? ? ? ?Allergies:  ?Allergies  ?Allergen Reactions  ? Butrans [Buprenorphine Hcl]   ?  Difficulty breathing, n/v  ? Nucynta [Tapentadol Hydrochloride] Anaphylaxis  ? Tapentadol Hcl Anaphylaxis  ? Glucose Polymer Other (See Comments)  ?  poison ?poison ?poison  ? Lactose Intolerance (Gi)   ? Sumac   ?  poison  ? Bactrim [Sulfamethoxazole-Trimethoprim] Itching and Rash  ? ? ? ?ROS:  ?See above HPI for pertinent positives and negatives ? ? ?Objective:   ?Height  (1.676 m), weight 261 lb (118.4 kg).  ?(if some vitals are omitted, this means that patient was UNABLE to obtain them. ) ?General: A & O * 3; sounds in no acute distress; in usual state of health.  ?Respiratory: speaking in full sentences, no conversational dyspnea ?Psych: insight appears good, mood- appears full ? ?  ? ?

## 2022-03-23 ENCOUNTER — Encounter: Payer: Self-pay | Admitting: Physician Assistant

## 2022-03-23 ENCOUNTER — Telehealth (INDEPENDENT_AMBULATORY_CARE_PROVIDER_SITE_OTHER): Payer: Medicare HMO | Admitting: Physician Assistant

## 2022-03-23 ENCOUNTER — Other Ambulatory Visit: Payer: Self-pay

## 2022-03-23 VITALS — Ht 66.0 in | Wt 261.0 lb

## 2022-03-23 DIAGNOSIS — Z6841 Body Mass Index (BMI) 40.0 and over, adult: Secondary | ICD-10-CM | POA: Diagnosis not present

## 2022-03-23 DIAGNOSIS — J45909 Unspecified asthma, uncomplicated: Secondary | ICD-10-CM

## 2022-03-23 DIAGNOSIS — I10 Essential (primary) hypertension: Secondary | ICD-10-CM

## 2022-03-23 DIAGNOSIS — J302 Other seasonal allergic rhinitis: Secondary | ICD-10-CM

## 2022-03-23 MED ORDER — CETIRIZINE HCL 10 MG PO TABS: 10.0000 mg | ORAL_TABLET | Freq: Every day | ORAL | 1 refills | Status: DC

## 2022-03-23 MED ORDER — ALBUTEROL SULFATE HFA 108 (90 BASE) MCG/ACT IN AERS: 2.0000 | INHALATION_SPRAY | Freq: Four times a day (QID) | RESPIRATORY_TRACT | 2 refills | Status: DC | PRN

## 2022-03-23 NOTE — Patient Instructions (Signed)
Asthma, Adult ?Asthma is a long-term (chronic) condition in which the airways get tight and narrow. The airways are the breathing passages that lead from the nose and mouth down into the lungs. A person with asthma will have times when symptoms get worse. These are called asthma attacks. They can cause coughing, whistling sounds when you breathe (wheezing), shortness of breath, and chest pain. They can make it hard to breathe. There is no cure for asthma, but medicines and lifestyle changes can help control it. ?There are many things that can bring on an asthma attack or make asthma symptoms worse (triggers). Common triggers include: ?Mold. ?Dust. ?Cigarette smoke. ?Cockroaches. ?Things that can cause allergy symptoms (allergens). These include animal skin flakes (dander) and pollen from trees or grass. ?Things that pollute the air. These may include household cleaners, wood smoke, smog, or chemical odors. ?Cold air, weather changes, and wind. ?Crying or laughing hard. ?Stress. ?Certain medicines or drugs. ?Certain foods such as dried fruit, potato chips, and grape juice. ?Infections, such as a cold or the flu. ?Certain medical conditions or diseases. ?Exercise or tiring activities. ?Asthma may be treated with medicines and by staying away from the things that cause asthma attacks. Types of medicines may include: ?Controller medicines. These help prevent asthma symptoms. They are usually taken every day. ?Fast-acting reliever or rescue medicines. These quickly relieve asthma symptoms. They are used as needed and provide short-term relief. ?Allergy medicines if your attacks are brought on by allergens. ?Medicines to help control the body's defense (immune) system. ?Follow these instructions at home: ?Avoiding triggers in your home ?Change your heating and air conditioning filter often. ?Limit your use of fireplaces and wood stoves. ?Get rid of pests (such as roaches and mice) and their droppings. ?Throw away plants  if you see mold on them. ?Clean your floors. Dust regularly. Use cleaning products that do not smell. ?Have someone vacuum when you are not home. Use a vacuum cleaner with a HEPA filter if possible. ?Replace carpet with wood, tile, or vinyl flooring. Carpet can trap animal skin flakes and dust. ?Use allergy-proof pillows, mattress covers, and box spring covers. ?Wash bed sheets and blankets every week in hot water. Dry them in a dryer. ?Keep your bedroom free of any triggers. ?Avoid pets and keep windows closed when things that cause allergy symptoms are in the air. ?Use blankets that are made of polyester or cotton. ?Clean bathrooms and kitchens with bleach. If possible, have someone repaint the walls in these rooms with mold-resistant paint. Keep out of the rooms that are being cleaned and painted. ?Wash your hands often with soap and water. If soap and water are not available, use hand sanitizer. ?Do not allow anyone to smoke in your home. ?General instructions ?Take over-the-counter and prescription medicines only as told by your doctor. ?Talk with your doctor if you have questions about how or when to take your medicines. ?Make note if you need to use your medicines more often than usual. ?Do not use any products that contain nicotine or tobacco, such as cigarettes and e-cigarettes. If you need help quitting, ask your doctor. ?Stay away from secondhand smoke. ?Avoid doing things outdoors when allergen counts are high and when air quality is low. ?Wear a ski mask when doing outdoor activities in the winter. The mask should cover your nose and mouth. Exercise indoors on cold days if you can. ?Warm up before you exercise. Take time to cool down after exercise. ?Use a peak flow meter as   told by your doctor. A peak flow meter is a tool that measures how well the lungs are working. ?Keep track of the peak flow meter's readings. Write them down. ?Follow your asthma action plan. This is a written plan for taking care  of your asthma and treating your attacks. ?Make sure you get all the shots (vaccines) that your doctor recommends. Ask your doctor about a flu shot and a pneumonia shot. ?Keep all follow-up visits as told by your doctor. This is important. ?Contact a doctor if: ?You have wheezing, shortness of breath, or a cough even while taking medicine to prevent attacks. ?The mucus you cough up (sputum) is thicker than usual. ?The mucus you cough up changes from clear or white to yellow, green, gray, or bloody. ?You have problems from the medicine you are taking, such as: ?A rash. ?Itching. ?Swelling. ?Trouble breathing. ?You need reliever medicines more than 2-3 times a week. ?Your peak flow reading is still at 50-79% of your personal best after following the action plan for 1 hour. ?You have a fever. ?Get help right away if: ?You seem to be worse and are not responding to medicine during an asthma attack. ?You are short of breath even at rest. ?You get short of breath when doing very little activity. ?You have trouble eating, drinking, or talking. ?You have chest pain or tightness. ?You have a fast heartbeat. ?Your lips or fingernails start to turn blue. ?You are light-headed or dizzy, or you faint. ?Your peak flow is less than 50% of your personal best. ?You feel too tired to breathe normally. ?Summary ?Asthma is a long-term (chronic) condition in which the airways get tight and narrow. An asthma attack can make it hard to breathe. ?Asthma cannot be cured, but medicines and lifestyle changes can help control it. ?Make sure you understand how to avoid triggers and how and when to use your medicines. ?This information is not intended to replace advice given to you by your health care provider. Make sure you discuss any questions you have with your health care provider. ?Document Revised: 04/06/2020 Document Reviewed: 04/16/2020 ?Elsevier Patient Education ? 2022 Elsevier Inc. ? ?

## 2022-03-29 ENCOUNTER — Other Ambulatory Visit: Payer: Self-pay | Admitting: Physician Assistant

## 2022-03-29 DIAGNOSIS — J019 Acute sinusitis, unspecified: Secondary | ICD-10-CM

## 2022-03-29 MED ORDER — AZITHROMYCIN 250 MG PO TABS: ORAL_TABLET | ORAL | 0 refills | Status: AC

## 2022-04-23 DIAGNOSIS — R69 Illness, unspecified: Secondary | ICD-10-CM | POA: Diagnosis not present

## 2022-06-29 DIAGNOSIS — K631 Perforation of intestine (nontraumatic): Secondary | ICD-10-CM | POA: Diagnosis not present

## 2022-06-29 DIAGNOSIS — Z933 Colostomy status: Secondary | ICD-10-CM | POA: Diagnosis not present

## 2022-07-12 DIAGNOSIS — Z933 Colostomy status: Secondary | ICD-10-CM | POA: Diagnosis not present

## 2022-07-12 DIAGNOSIS — K631 Perforation of intestine (nontraumatic): Secondary | ICD-10-CM | POA: Diagnosis not present

## 2022-09-06 DIAGNOSIS — K631 Perforation of intestine (nontraumatic): Secondary | ICD-10-CM | POA: Diagnosis not present

## 2022-09-06 DIAGNOSIS — Z933 Colostomy status: Secondary | ICD-10-CM | POA: Diagnosis not present

## 2022-09-27 ENCOUNTER — Telehealth: Payer: Self-pay | Admitting: *Deleted

## 2022-09-27 NOTE — Chronic Care Management (AMB) (Signed)
  Care Coordination  Outreach Note  09/27/2022 Name: Jordan [name] MRN: [id number] DOB: 07-Nov-1981   Care Coordination Outreach Attempts: An unsuccessful telephone outreach was attempted today to offer the patient information about available care coordination services as a benefit of their health plan.   Follow Up Plan:  Additional outreach attempts will be made to offer the patient care coordination information and services.   Encounter Outcome:  No Answer  [place]  Direct Dial: [phone]

## 2022-10-04 NOTE — Chronic Care Management (AMB) (Signed)
  Care Coordination   Note   10/04/2022 Name: Jordan [name] MRN: [id number] DOB: 1981-06-02  Jordan [name] is a 41 y.o. year old male who sees [place], [place], [place] for primary care. I reached out to [employer] [name] by phone today to offer care coordination services.  [name] was given information about Care Coordination services today including:   The Care Coordination services include support from the care team which includes your Nurse Coordinator, Clinical Social Worker, or Pharmacist.  The Care Coordination team is here to help remove barriers to the health concerns and goals most important to you. Care Coordination services are voluntary, and the patient may decline or stop services at any time by request to their care team member.   Care Coordination Consent Status: Patient agreed to services and verbal consent obtained.   Follow up plan:  Telephone appointment with care coordination team member scheduled for:  10/14/22  Encounter Outcome:  Pt. Scheduled  [place]  Direct Dial: [phone]

## 2022-10-14 ENCOUNTER — Telehealth: Payer: Self-pay

## 2022-10-14 NOTE — Patient Outreach (Signed)
  Care Coordination   10/14/2022 Name: Jordan [name] MRN: [id number] DOB: February 26, 1981   Care Coordination Outreach Attempts:  An unsuccessful telephone outreach was attempted today to offer the patient information about available care coordination services as a benefit of their health plan.   Follow Up Plan:  Additional outreach attempts will be made to offer the patient care coordination information and services.   Encounter Outcome:  No Answer  Care Coordination Interventions Activated:  No   Care Coordination Interventions:  No, not indicated    Lazaro Arms RN, BSN, [facility]   Phone: [phone]

## 2022-11-03 NOTE — Telephone Encounter (Signed)
Rescheduled 11/11/22  Jordan [name]  Care Coordination Care Guide  Direct Dial: [phone]

## 2022-12-02 DIAGNOSIS — Z933 Colostomy status: Secondary | ICD-10-CM | POA: Diagnosis not present

## 2022-12-02 DIAGNOSIS — K631 Perforation of intestine (nontraumatic): Secondary | ICD-10-CM | POA: Diagnosis not present

## 2022-12-03 ENCOUNTER — Telehealth: Payer: Self-pay

## 2022-12-03 NOTE — Patient Outreach (Signed)
  Care Coordination   12/03/2022 Name: Jordan [name] MRN: [id number] DOB: [birth year]   Care Coordination Outreach Attempts:  An unsuccessful telephone outreach was attempted today to offer the patient information about available care coordination services as a benefit of their health plan.   Follow Up Plan:  Additional outreach attempts will be made to offer the patient care coordination information and services.   Encounter Outcome:  No Answer   Care Coordination Interventions:  No, not indicated    Juanell Fairly RN, BSN, [facility] Care Coordinator [facility]   Phone: [phone]

## 2022-12-06 ENCOUNTER — Telehealth: Payer: Self-pay | Admitting: *Deleted

## 2022-12-06 NOTE — Telephone Encounter (Signed)
Tried to contact patient to give him the below information and unable to LVM due to it being full.  Due to the need she recommends pt go to UC.  Unfortunately we do not have any available appointments to be seen quickly due to schedule filling up. Makayla Lanter Zimmerman Rumple, CMA        [place], [place], [place]  You12 minutes ago (3:43 PM)    Per Herbert Seta pt needs to be seen at [facility] if the pt would like to be seen after that visit they can schedule a appt

## 2022-12-06 NOTE — Telephone Encounter (Addendum)
Symptoms that wife mentioned, I forgot to put in note.  She said he is having fever, cough,which is badly effecting his stoma and he is very stuffy. When he lays back fills like lungs filling up, has had something like this before. Son was sick last week.      Patients wife called and was told that he should go to urgent care/ED due to our office not having appointments available.  She called back and wanted to check and see if provider would send in an antibiotic for patient.  She said that previous provider would do that sometimes when needed.  I told her that we could send a message but that normally the patient would need an appointment.  Patients wife also said that she was told that Herbert Seta was going to be the provider of patient and rest of family and she is wanting to see if that is going to be the case and that if not she may need to transfer care somewhere else.  I told her that I would send a message and see what she says. Dayami Taitt Zimmerman Rumple, CMA

## 2022-12-07 NOTE — Telephone Encounter (Signed)
Contacted patient and gave him the below information.  Informed him that we could definitely get him a future appointment however with everything he currently has going on we could not get him in to be evaluated soon recommended him to go to UC to be evaluated. Ciria Bernardini Zimmerman Rumple, CMA

## 2022-12-09 DIAGNOSIS — K9409 Other complications of colostomy: Secondary | ICD-10-CM | POA: Diagnosis not present

## 2022-12-09 DIAGNOSIS — K469 Unspecified abdominal hernia without obstruction or gangrene: Secondary | ICD-10-CM | POA: Diagnosis not present

## 2022-12-23 DIAGNOSIS — H5213 Myopia, bilateral: Secondary | ICD-10-CM | POA: Diagnosis not present

## 2023-04-19 DIAGNOSIS — K631 Perforation of intestine (nontraumatic): Secondary | ICD-10-CM | POA: Diagnosis not present

## 2023-04-19 DIAGNOSIS — Z933 Colostomy status: Secondary | ICD-10-CM | POA: Diagnosis not present

## 2023-04-26 DIAGNOSIS — Z933 Colostomy status: Secondary | ICD-10-CM | POA: Diagnosis not present

## 2023-04-27 NOTE — Progress Notes (Unsigned)
   Established Patient Office Visit  Subjective   Patient ID: Jordan [name], male    DOB: 1981-06-12  Age: 42 y.o. MRN: [id number]  No chief complaint on file.   HPI Jordan [name] is a 42 y.o. male presenting today for follow up of ***.  ROS Negative unless otherwise noted in HPI   Objective:     There were no vitals taken for this visit.  Physical Exam   No results found for any visits on 04/28/23.  {Labs (Optional):23779}  The 10-year ASCVD risk score (Arnett DK, et al., 2019) is: 1.1%    Assessment & Plan:  There are no diagnoses linked to this encounter.  No follow-ups on file.    Melida Quitter, PA

## 2023-04-28 ENCOUNTER — Ambulatory Visit (INDEPENDENT_AMBULATORY_CARE_PROVIDER_SITE_OTHER): Payer: Medicare HMO | Admitting: Family Medicine

## 2023-04-28 ENCOUNTER — Encounter: Payer: Self-pay | Admitting: Family Medicine

## 2023-04-28 VITALS — BP 144/95 | HR 70 | Resp 18 | Ht 66.0 in | Wt 281.0 lb

## 2023-04-28 DIAGNOSIS — Z6841 Body Mass Index (BMI) 40.0 and over, adult: Secondary | ICD-10-CM

## 2023-04-28 DIAGNOSIS — J45909 Unspecified asthma, uncomplicated: Secondary | ICD-10-CM | POA: Diagnosis not present

## 2023-04-28 DIAGNOSIS — F419 Anxiety disorder, unspecified: Secondary | ICD-10-CM | POA: Diagnosis not present

## 2023-04-28 DIAGNOSIS — F32A Depression, unspecified: Secondary | ICD-10-CM

## 2023-04-28 DIAGNOSIS — I10 Essential (primary) hypertension: Secondary | ICD-10-CM | POA: Diagnosis not present

## 2023-04-28 MED ORDER — ALBUTEROL SULFATE HFA 108 (90 BASE) MCG/ACT IN AERS: 2.0000 | INHALATION_SPRAY | Freq: Four times a day (QID) | RESPIRATORY_TRACT | 2 refills | Status: DC | PRN

## 2023-04-28 MED ORDER — VALSARTAN 80 MG PO TABS: 80.0000 mg | ORAL_TABLET | Freq: Every day | ORAL | 3 refills | Status: DC

## 2023-04-28 NOTE — Assessment & Plan Note (Signed)
We discussed obesity as a chronic disease and the importance of establishing habits that will be helpful in the long-term.  Patient's goal in the next 3 months will be to increase fiber and protein intake.  Encouraged him to continue walking and staying active is much as he can.  He is also going to call his insurance to see which weight loss medications they will cover if we do need to use them in the future.  At next appointment, we may also discuss the benefit of seeing a nutritionist given his specific nutritional needs with having a colostomy.

## 2023-04-28 NOTE — Patient Instructions (Addendum)
Over the next few months, try to meet your goals for daily fiber and protein intake: - 30 g fiber each day - 90 g protein each day  Podcast: The Obesity Guide with Matthea Rentea  We will get the process going for you to get a blood pressure cuff covered by insurance.  Call your insurance to see which weight loss medications they will cover and let me know at your next appointment.

## 2023-04-28 NOTE — Assessment & Plan Note (Signed)
Blood pressure elevated on intake and remained elevated on repeat.  Encourage patient to continue with weight loss efforts and low-sodium diet, but we discussed that he likely has a genetic predisposition and it is important to keep his blood pressure below the goal.  Patient's family has a history of cardiovascular related death at young age and he would like to break this trend, so he is open to starting medication.  Starting valsartan 80 mg daily.  Getting blood pressure cuff prescription to have insurance cover.  Will continue to monitor.

## 2023-04-28 NOTE — Assessment & Plan Note (Signed)
PHQ-9 score of 6, GAD-7 score of 2.  Patient is not interested in starting medication at this time.  Encouraged him to continue with doing things like taking walks to care for his mental health.  Will continue to monitor.

## 2023-04-28 NOTE — Assessment & Plan Note (Signed)
Stable.  Continue albuterol inhaler as needed.  Will continue to monitor.

## 2023-05-24 ENCOUNTER — Ambulatory Visit (INDEPENDENT_AMBULATORY_CARE_PROVIDER_SITE_OTHER): Payer: Medicare HMO

## 2023-05-24 VITALS — Ht 66.0 in | Wt 281.0 lb

## 2023-05-24 DIAGNOSIS — Z1159 Encounter for screening for other viral diseases: Secondary | ICD-10-CM

## 2023-05-24 DIAGNOSIS — Z0000 Encounter for general adult medical examination without abnormal findings: Secondary | ICD-10-CM

## 2023-05-24 NOTE — Patient Instructions (Addendum)
Jordan [name] , Thank you for taking time to come for your Medicare Wellness Visit. I appreciate your ongoing commitment to your health goals. Please review the following plan we discussed and let me know if I can assist you in the future.   These are the goals we discussed:  Goals   None     This is a list of the screening recommended for you and due dates:  Health Maintenance  Topic Date Due   COVID-19 Vaccine (1) 06/09/2023*   Hepatitis C Screening  05/23/2024*   Flu Shot  07/28/2023   Medicare Annual Wellness Visit  05/23/2024   DTaP/Tdap/Td vaccine (3 - Td or Tdap) 06/26/2029   HIV Screening  Completed   HPV Vaccine  Aged Out  *Topic was postponed. The date shown is not the original due date.    Advanced directives: Advance directive discussed with you today. Even though you declined this today, please call our office should you change your mind, and we can give you the proper paperwork for you to fill out.   Conditions/risks identified: None  Next appointment: Follow up in one year for your annual wellness visit   Preventive Care 40-64 Years, Male Preventive care refers to lifestyle choices and visits with your health care provider that can promote health and wellness. What does preventive care include? A yearly physical exam. This is also called an annual well check. Dental exams once or twice a year. Routine eye exams. Ask your health care provider how often you should have your eyes checked. Personal lifestyle choices, including: Daily care of your teeth and gums. Regular physical activity. Eating a healthy diet. Avoiding tobacco and drug use. Limiting alcohol use. Practicing safe sex. Taking low-dose aspirin every day starting at age 60. What happens during an annual well check? The services and screenings done by your health care provider during your annual well check will depend on your age, overall health, lifestyle risk factors, and family history of  disease. Counseling  Your health care provider may ask you questions about your: Alcohol use. Tobacco use. Drug use. Emotional well-being. Home and relationship well-being. Sexual activity. Eating habits. Work and work Astronomer. Screening  You may have the following tests or measurements: Height, weight, and BMI. Blood pressure. Lipid and cholesterol levels. These may be checked every 5 years, or more frequently if you are over 50 years old. Skin check. Lung cancer screening. You may have this screening every year starting at age 18 if you have a 30-pack-year history of smoking and currently smoke or have quit within the past 15 years. Fecal occult blood test (FOBT) of the stool. You may have this test every year starting at age 67. Flexible sigmoidoscopy or colonoscopy. You may have a sigmoidoscopy every 5 years or a colonoscopy every 10 years starting at age 6. Prostate cancer screening. Recommendations will vary depending on your family history and other risks. Hepatitis C blood test. Hepatitis B blood test. Sexually transmitted disease (STD) testing. Diabetes screening. This is done by checking your blood sugar (glucose) after you have not eaten for a while (fasting). You may have this done every 1-3 years. Discuss your test results, treatment options, and if necessary, the need for more tests with your health care provider. Vaccines  Your health care provider may recommend certain vaccines, such as: Influenza vaccine. This is recommended every year. Tetanus, diphtheria, and acellular pertussis (Tdap, Td) vaccine. You may need a Td booster every 10 years. Zoster vaccine. You  may need this after age 47. Pneumococcal 13-valent conjugate (PCV13) vaccine. You may need this if you have certain conditions and have not been vaccinated. Pneumococcal polysaccharide (PPSV23) vaccine. You may need one or two doses if you smoke cigarettes or if you have certain conditions. Talk to your  health care provider about which screenings and vaccines you need and how often you need them. This information is not intended to replace advice given to you by your health care provider. Make sure you discuss any questions you have with your health care provider. Document Released: 01/09/2016 Document Revised: 09/01/2016 Document Reviewed: 10/14/2015 Elsevier Interactive Patient Education  2017 [employer].  Fall Prevention in the Home Falls can cause injuries. They can happen to people of all ages. There are many things you can do to make your home safe and to help prevent falls. What can I do on the outside of my home? Regularly fix the edges of walkways and driveways and fix any cracks. Remove anything that might make you trip as you walk through a door, such as a raised step or threshold. Trim any bushes or trees on the path to your home. Use bright outdoor lighting. Clear any walking paths of anything that might make someone trip, such as rocks or tools. Regularly check to see if handrails are loose or broken. Make sure that both sides of any steps have handrails. Any raised decks and porches should have guardrails on the edges. Have any leaves, snow, or ice cleared regularly. Use sand or salt on walking paths during winter. Clean up any spills in your garage right away. This includes oil or grease spills. What can I do in the bathroom? Use night lights. Install grab bars by the toilet and in the tub and shower. Do not use towel bars as grab bars. Use non-skid mats or decals in the tub or shower. If you need to sit down in the shower, use a plastic, non-slip stool. Keep the floor dry. Clean up any water that spills on the floor as soon as it happens. Remove soap buildup in the tub or shower regularly. Attach bath mats securely with double-sided non-slip rug tape. Do not have throw rugs and other things on the floor that can make you trip. What can I do in the bedroom? Use night  lights. Make sure that you have a light by your bed that is easy to reach. Do not use any sheets or blankets that are too big for your bed. They should not hang down onto the floor. Have a firm chair that has side arms. You can use this for support while you get dressed. Do not have throw rugs and other things on the floor that can make you trip. What can I do in the kitchen? Clean up any spills right away. Avoid walking on wet floors. Keep items that you use a lot in easy-to-reach places. If you need to reach something above you, use a strong step stool that has a grab bar. Keep electrical cords out of the way. Do not use floor polish or wax that makes floors slippery. If you must use wax, use non-skid floor wax. Do not have throw rugs and other things on the floor that can make you trip. What can I do with my stairs? Do not leave any items on the stairs. Make sure that there are handrails on both sides of the stairs and use them. Fix handrails that are broken or loose. Make sure that  handrails are as long as the stairways. Check any carpeting to make sure that it is firmly attached to the stairs. Fix any carpet that is loose or worn. Avoid having throw rugs at the top or bottom of the stairs. If you do have throw rugs, attach them to the floor with carpet tape. Make sure that you have a light switch at the top of the stairs and the bottom of the stairs. If you do not have them, ask someone to add them for you. What else can I do to help prevent falls? Wear shoes that: Do not have high heels. Have rubber bottoms. Are comfortable and fit you well. Are closed at the toe. Do not wear sandals. If you use a stepladder: Make sure that it is fully opened. Do not climb a closed stepladder. Make sure that both sides of the stepladder are locked into place. Ask someone to hold it for you, if possible. Clearly mark and make sure that you can see: Any grab bars or handrails. First and last  steps. Where the edge of each step is. Use tools that help you move around (mobility aids) if they are needed. These include: Canes. Walkers. Scooters. Crutches. Turn on the lights when you go into a dark area. Replace any light bulbs as soon as they burn out. Set up your furniture so you have a clear path. Avoid moving your furniture around. If any of your floors are uneven, fix them. If there are any pets around you, be aware of where they are. Review your medicines with your doctor. Some medicines can make you feel dizzy. This can increase your chance of falling. Ask your doctor what other things that you can do to help prevent falls. This information is not intended to replace advice given to you by your health care provider. Make sure you discuss any questions you have with your health care provider. Document Released: 10/09/2009 Document Revised: 05/20/2016 Document Reviewed: 01/17/2015 Elsevier Interactive Patient Education  2017 [employer].

## 2023-05-24 NOTE — Progress Notes (Signed)
Subjective:   Jordan [name] is a 42 y.o. male who presents for Medicare Annual/Subsequent preventive examination.  Review of Systems    Virtual Visit via Telephone Note  I connected with  Jordan [name] on 05/24/23 at  1:00 PM EDT by telephone and verified that I am speaking with the correct person using two identifiers.  Location: Patient: Home Provider: Office Persons participating in the virtual visit: patient/Nurse Health Advisor   I discussed the limitations, risks, security and privacy concerns of performing an evaluation and management service by telephone and the availability of in person appointments. The patient expressed understanding and agreed to proceed.  Interactive audio and video telecommunications were attempted between this nurse and patient, however failed, due to patient having technical difficulties OR patient did not have access to video capability.  We continued and completed visit with audio only.  Some vital signs may be absent or patient reported.   Tillie Rung, LPN  Cardiac Risk Factors include: advanced age (>47men, >95 women);hypertension;male gender     Objective:    Today's Vitals   05/24/23 1339 05/24/23 1341  Weight: 281 lb (127.5 kg)   Height: 5\' 6"  (1.676 m)   PainSc:  0-No pain   Body mass index is 45.35 kg/m.     05/24/2023    1:51 PM 10/15/2020    4:19 PM 10/01/2020    7:24 AM 09/30/2020   10:14 AM 09/22/2020   11:57 AM 05/04/2020    2:59 PM 01/08/2020   12:41 PM  Advanced Directives  Does Patient Have a Medical Advance Directive? No No No No No No No  Would patient like information on creating a medical advance directive? No - Patient declined No - Patient declined No - Patient declined No - Patient declined Yes (MAU/Ambulatory/Procedural Areas - Information given)      Current Medications (verified) Outpatient Encounter Medications as of 05/24/2023  Medication Sig   albuterol (VENTOLIN HFA)  108 (90 Base) MCG/ACT inhaler Inhale 2 puffs into the lungs every 6 (six) hours as needed for wheezing or shortness of breath.   aspirin 81 MG tablet Take 81 mg by mouth daily.    cetirizine (ZYRTEC) 10 MG tablet Take 1 tablet (10 mg total) by mouth daily.   valsartan (DIOVAN) 80 MG tablet Take 1 tablet (80 mg total) by mouth daily.   No facility-administered encounter medications on file as of 05/24/2023.    Allergies (verified) Butrans [buprenorphine hcl], Nucynta [tapentadol hydrochloride], Tapentadol hcl, Glucose polymer, Sumac, and Bactrim [sulfamethoxazole-trimethoprim]   History: Past Medical History:  Diagnosis Date   Anxiety and depression    Asthma    Chronic pain syndrome    History of kidney stones    HYPERTENSION, MILD    OSTEOARTHRITIS    Recurrent UTI    Sleep apnea    Past Surgical History:  Procedure Laterality Date   COLONOSCOPY WITH PROPOFOL N/A 09/30/2020   Procedure: COLONOSCOPY WITH PROPOFOL;  Surgeon: Midge Minium, MD;  Location: [facility] ENDOSCOPY;  Service: Endoscopy;  Laterality: N/A;   JOINT REPLACEMENT     KNEE SURGERY Right    7 total surgeries   LYSIS OF ADHESION  10/15/2020   Procedure: LYSIS OF ADHESION;  Surgeon: [name], MD;  Location: [facility] ORS;  Service: General;;   REPLACEMENT TOTAL KNEE Right    XI ROBOT ASSISTED DIAGNOSTIC LAPAROSCOPY N/A 10/15/2020   Procedure: XI ROBOT ASSISTED DIAGNOSTIC LAPAROSCOPY, HARTMAN'S;  Surgeon: [name], MD;  Location: [facility]  ORS;  Service: General;  Laterality: N/A;   Family History  Problem Relation Age of Onset   Heart disease Father    Heart attack Father    Diabetes Father    High Cholesterol Father    High blood pressure Father    Heart disease Paternal Grandfather    High Cholesterol Paternal Grandfather    Heart disease Paternal Grandmother    Heart attack Paternal Grandmother    Diabetes Paternal Grandmother    Heart disease Paternal Uncle    Cancer Paternal Uncle    Heart attack  Paternal Uncle    Diabetes Paternal Uncle    High Cholesterol Paternal Uncle    Diabetes Paternal Aunt    Hypertension Maternal Uncle    Cancer Maternal Aunt        lung   Cancer Maternal Uncle        lung   Depression Mother    Dementia Mother    Social History   Socioeconomic History   Marital status: Married    Spouse name: Designer, television/film set   Number of children: 2   Years of education: some college   Highest education level: Not on file  Occupational History    Employer: UNEMPLOYED    Comment: Disabled  Tobacco Use   Smoking status: Former    Types: Cigars    Quit date: 01/18/2014    Years since quitting: 9.3    Passive exposure: Never   Smokeless tobacco: Never  Vaping Use   Vaping Use: Never used  Substance and Sexual Activity   Alcohol use: Yes    Alcohol/week: 13.0 standard drinks of alcohol    Types: 7 Standard drinks or equivalent, 6 Cans of beer per week   Drug use: Yes    Frequency: 7.0 times per week    Types: Marijuana    Comment: 09/28/20    Sexual activity: Yes  Other Topics Concern   Not on file  Social History Narrative   Caffeine Use:  6 beverages daily   Regular exercise:  2 x weekly         Social Determinants of Health   Financial Resource Strain: Low Risk  (05/24/2023)   Overall Financial Resource Strain (CARDIA)    Difficulty of Paying Living Expenses: Not hard at all  Food Insecurity: No Food Insecurity (05/24/2023)   Hunger Vital Sign    Worried About Running Out of Food in the Last Year: Never true    Ran Out of Food in the Last Year: Never true  Transportation Needs: No Transportation Needs (05/24/2023)   PRAPARE - Administrator, Civil Service (Medical): No    Lack of Transportation (Non-Medical): No  Physical Activity: Insufficiently Active (05/24/2023)   Exercise Vital Sign    Days of Exercise per Week: 3 days    Minutes of Exercise per Session: 30 min  Stress: No Stress Concern Present (05/24/2023)   [employer] of Occupational Health - Occupational Stress Questionnaire    Feeling of Stress : Not at all  Social Connections: Moderately Isolated (05/24/2023)   Social Connection and Isolation Panel [NHANES]    Frequency of Communication with Friends and Family: More than three times a week    Frequency of Social Gatherings with Friends and Family: More than three times a week    Attends Religious Services: Never    Database administrator or Organizations: No    Attends Banker Meetings: Never    Marital  Status: Married    Tobacco Counseling Counseling given: Not Answered   Clinical Intake:  Pre-visit preparation completed: No  Pain : No/denies pain Pain Score: 0-No pain     BMI - recorded: 45.35 Nutritional Status: BMI > 30  Obese Nutritional Risks: None Diabetes: No  How often do you need to have someone help you when you read instructions, pamphlets, or other written materials from your doctor or pharmacy?: 1 - Never  Diabetic?  No  Interpreter Needed?: No  Information entered by :: Theresa Mulligan LPN   Activities of Daily Living    05/24/2023    1:50 PM  In your present state of health, do you have any difficulty performing the following activities:  Hearing? 0  Vision? 0  Difficulty concentrating or making decisions? 0  Walking or climbing stairs? 0  Dressing or bathing? 0  Doing errands, shopping? 0  Preparing Food and eating ? N  Using the Toilet? N  In the past six months, have you accidently leaked urine? N  Do you have problems with loss of bowel control? N  Managing your Medications? N  Managing your Finances? N  Housekeeping or managing your Housekeeping? N    Patient Care Team: Melida Quitter, PA as PCP - General (Family Medicine) Currie Paris, MD as Referring Physician (Orthopedic Surgery) Oretha Milch, MD as Consulting Physician (Pulmonary Disease) Juanell Fairly, RN as [facility] Care  Management  Indicate any recent Medical Services you may have received from other than [facility] providers in the past year (date may be approximate).     Assessment:   This is a routine wellness examination for [place].  Hearing/Vision screen Hearing Screening - Comments:: Denies hearing difficulties   Vision Screening - Comments:: Wears rx glasses - up to date with routine eye exams with    Dietary issues and exercise activities discussed: Current Exercise Habits: Home exercise routine, Type of exercise: walking, Time (Minutes): 30, Frequency (Times/Week): 3, Weekly Exercise (Minutes/Week): 90, Intensity: Moderate, Exercise limited by: None identified   Goals Addressed   None    Depression Screen    05/24/2023    1:48 PM 04/28/2023   11:14 AM 12/23/2021    9:43 AM 08/24/2021    9:25 AM 08/27/2020    2:33 PM 07/02/2020    2:38 PM  PHQ 2/9 Scores  PHQ - 2 Score 0 1 2 0 0 2  PHQ- 9 Score 0 6 4 3 4 7     Fall Risk    05/24/2023    1:51 PM 04/28/2023   11:14 AM 03/23/2022   10:12 AM 12/23/2021    9:43 AM 08/24/2021    9:24 AM  Fall Risk   Falls in the past year? 0 0 0 0 0  Number falls in past yr: 0 0 0 0 0  Injury with Fall? 0 0 0 0 0  Risk for fall due to : No Fall Risks No Fall Risks No Fall Risks No Fall Risks No Fall Risks  Follow up Falls prevention discussed Falls evaluation completed Falls evaluation completed Falls evaluation completed Falls evaluation completed    FALL RISK PREVENTION PERTAINING TO THE HOME:  Any stairs in or around the home? Yes  If so, are there any without handrails? No  Home free of loose throw rugs in walkways, pet beds, electrical cords, etc? Yes  Adequate lighting in your home to reduce risk of falls? Yes   ASSISTIVE DEVICES UTILIZED TO PREVENT FALLS:  Life alert? No  Use of a cane, walker or w/c? Yes Grab bars in the bathroom? No  Shower chair or bench in shower? No  Elevated toilet seat or a handicapped toilet? Yes  TIMED UP AND GO:  Was  the test performed? No . Audio visit  Cognitive Function:        05/24/2023    1:51 PM  6CIT Screen  What Year? 0 points  What month? 0 points  What time? 0 points  Count back from 20 0 points  Months in reverse 0 points  Repeat phrase 2 points  Total Score 2 points    Immunizations Immunization History  Administered Date(s) Administered   Influenza Split 09/27/2011   Influenza,inj,Quad PF,6+ Mos 10/24/2014, 09/17/2015, 10/01/2016, 11/06/2018   Influenza-Unspecified 09/05/2017   Td 04/26/2009   Tdap 06/27/2019    TDAP status: Up to date  Flu Vaccine status: Up to date    Covid-19 vaccine status: Declined, Education has been provided regarding the importance of this vaccine but patient still declined. Advised may receive this vaccine at local pharmacy or Health Dept.or vaccine clinic. Aware to provide a copy of the vaccination record if obtained from local pharmacy or Health Dept. Verbalized acceptance and understanding.  Qualifies for Shingles Vaccine? Yes   Zostavax completed No   Shingrix Completed?: No.    Education has been provided regarding the importance of this vaccine. Patient has been advised to call insurance company to determine out of pocket expense if they have not yet received this vaccine. Advised may also receive vaccine at local pharmacy or Health Dept. Verbalized acceptance and understanding.  Screening Tests Health Maintenance  Topic Date Due   COVID-19 Vaccine (1) 06/09/2023 (Originally 06/23/1982)   Hepatitis C Screening  05/23/2024 (Originally 12/23/1997)   INFLUENZA VACCINE  07/28/2023   Medicare Annual Wellness (AWV)  05/23/2024   DTaP/Tdap/Td (3 - Td or Tdap) 06/26/2029   HIV Screening  Completed   HPV VACCINES  Aged Out    Health Maintenance  There are no preventive care reminders to display for this patient.     Lung Cancer Screening: (Low Dose CT Chest recommended if Age 38-80 years, 30 pack-year currently smoking OR have quit w/in  15years.) does not qualify.     Additional Screening:  Hepatitis C Screening: does qualify; Deferred  Vision Screening: Recommended annual ophthalmology exams for early detection of glaucoma and other disorders of the eye. Is the patient up to date with their annual eye exam?  Yes  Who is the provider or what is the name of the office in which the patient attends annual eye exams? [facility] If pt is not established with a provider, would they like to be referred to a provider to establish care? No .   Dental Screening: Recommended annual dental exams for proper oral hygiene  Community Resource Referral / Chronic Care Management:  CRR required this visit?  No   CCM required this visit?  No      Plan:     I have personally reviewed and noted the following in the patient's chart:   Medical and social history Use of alcohol, tobacco or illicit drugs  Current medications and supplements including opioid prescriptions. Patient is not currently taking opioid prescriptions. Functional ability and status Nutritional status Physical activity Advanced directives List of other physicians Hospitalizations, surgeries, and ER visits in previous 12 months Vitals Screenings to include cognitive, depression, and falls Referrals and appointments  In addition, I  have reviewed and discussed with patient certain preventive protocols, quality metrics, and best practice recommendations. A written personalized care plan for preventive services as well as general preventive health recommendations were provided to patient.     Tillie Rung, LPN   [id number]/61/[id number]   Nurse Notes:Patient due Hep-C Screening

## 2023-05-25 NOTE — Addendum Note (Signed)
Addended by: Saralyn Pilar on: 05/25/2023 12:17 PM   Modules accepted: Orders

## 2023-05-26 ENCOUNTER — Telehealth: Payer: Self-pay

## 2023-05-26 DIAGNOSIS — Z0000 Encounter for general adult medical examination without abnormal findings: Secondary | ICD-10-CM

## 2023-05-26 NOTE — Progress Notes (Signed)
Subjective:   Jordan [name] is a 42 y.o. male who presents for Medicare Annual/Subsequent preventive examination.  Review of Systems    Virtual Visit via Telephone Note  I connected with  Jordan [name] on 05/26/23 at 11:30 AM EDT by telephone and verified that I am speaking with the correct person using two identifiers.  Location: Patient: Home  Provider:  Persons participating in the virtual visit: patient/Nurse Health Advisor   I discussed the limitations, risks, security and privacy concerns of performing an evaluation and management service by telephone and the availability of in person appointments. The patient expressed understanding and agreed to proceed.  Interactive audio and video telecommunications were attempted between this nurse and patient, however failed, due to patient having technical difficulties OR patient did not have access to video capability.  We continued and completed visit with audio only.  Some vital signs may be absent or patient reported.   Tillie Rung, LPN        Objective:    There were no vitals filed for this visit. There is no height or weight on file to calculate BMI.     05/24/2023    1:51 PM 10/15/2020    4:19 PM 10/01/2020    7:24 AM 09/30/2020   10:14 AM 09/22/2020   11:57 AM 05/04/2020    2:59 PM 01/08/2020   12:41 PM  Advanced Directives  Does Patient Have a Medical Advance Directive? No No No No No No No  Would patient like information on creating a medical advance directive? No - Patient declined No - Patient declined No - Patient declined No - Patient declined Yes (MAU/Ambulatory/Procedural Areas - Information given)      Current Medications (verified) Outpatient Encounter Medications as of 05/26/2023  Medication Sig   albuterol (VENTOLIN HFA) 108 (90 Base) MCG/ACT inhaler Inhale 2 puffs into the lungs every 6 (six) hours as needed for wheezing or shortness of breath.   aspirin 81 MG tablet  Take 81 mg by mouth daily.    cetirizine (ZYRTEC) 10 MG tablet Take 1 tablet (10 mg total) by mouth daily.   valsartan (DIOVAN) 80 MG tablet Take 1 tablet (80 mg total) by mouth daily.   No facility-administered encounter medications on file as of 05/26/2023.    Allergies (verified) Butrans [buprenorphine hcl], Nucynta [tapentadol hydrochloride], Tapentadol hcl, Glucose polymer, Sumac, and Bactrim [sulfamethoxazole-trimethoprim]   History: Past Medical History:  Diagnosis Date   Anxiety and depression    Asthma    Chronic pain syndrome    History of kidney stones    HYPERTENSION, MILD    OSTEOARTHRITIS    Recurrent UTI    Sleep apnea    Past Surgical History:  Procedure Laterality Date   COLONOSCOPY WITH PROPOFOL N/A 09/30/2020   Procedure: COLONOSCOPY WITH PROPOFOL;  Surgeon: Midge Minium, MD;  Location: [facility] ENDOSCOPY;  Service: Endoscopy;  Laterality: N/A;   JOINT REPLACEMENT     KNEE SURGERY Right    7 total surgeries   LYSIS OF ADHESION  10/15/2020   Procedure: LYSIS OF ADHESION;  Surgeon: [name], MD;  Location: [facility] ORS;  Service: General;;   REPLACEMENT TOTAL KNEE Right    XI ROBOT ASSISTED DIAGNOSTIC LAPAROSCOPY N/A 10/15/2020   Procedure: XI ROBOT ASSISTED DIAGNOSTIC LAPAROSCOPY, HARTMAN'S;  Surgeon: [name], MD;  Location: [facility] ORS;  Service: General;  Laterality: N/A;   Family History  Problem Relation Age of Onset   Heart disease Father  Heart attack Father    Diabetes Father    High Cholesterol Father    High blood pressure Father    Heart disease Paternal Grandfather    High Cholesterol Paternal Grandfather    Heart disease Paternal Grandmother    Heart attack Paternal Grandmother    Diabetes Paternal Grandmother    Heart disease Paternal Uncle    Cancer Paternal Uncle    Heart attack Paternal Uncle    Diabetes Paternal Uncle    High Cholesterol Paternal Uncle    Diabetes Paternal Aunt    Hypertension Maternal Uncle    Cancer  Maternal Aunt        lung   Cancer Maternal Uncle        lung   Depression Mother    Dementia Mother    Social History   Socioeconomic History   Marital status: Married    Spouse name: Designer, television/film set   Number of children: 2   Years of education: some college   Highest education level: Not on file  Occupational History    Employer: UNEMPLOYED    Comment: Disabled  Tobacco Use   Smoking status: Former    Types: Cigars    Quit date: 01/18/2014    Years since quitting: 9.3    Passive exposure: Never   Smokeless tobacco: Never  Vaping Use   Vaping Use: Never used  Substance and Sexual Activity   Alcohol use: Yes    Alcohol/week: 13.0 standard drinks of alcohol    Types: 7 Standard drinks or equivalent, 6 Cans of beer per week   Drug use: Yes    Frequency: 7.0 times per week    Types: Marijuana    Comment: 09/28/20    Sexual activity: Yes  Other Topics Concern   Not on file  Social History Narrative   Caffeine Use:  6 beverages daily   Regular exercise:  2 x weekly         Social Determinants of Health   Financial Resource Strain: Low Risk  (05/24/2023)   Overall Financial Resource Strain (CARDIA)    Difficulty of Paying Living Expenses: Not hard at all  Food Insecurity: No Food Insecurity (05/24/2023)   Hunger Vital Sign    Worried About Running Out of Food in the Last Year: Never true    Ran Out of Food in the Last Year: Never true  Transportation Needs: No Transportation Needs (05/24/2023)   PRAPARE - Administrator, Civil Service (Medical): No    Lack of Transportation (Non-Medical): No  Physical Activity: Insufficiently Active (05/24/2023)   Exercise Vital Sign    Days of Exercise per Week: 3 days    Minutes of Exercise per Session: 30 min  Stress: No Stress Concern Present (05/24/2023)   [employer] of Occupational Health - Occupational Stress Questionnaire    Feeling of Stress : Not at all  Social Connections: Moderately Isolated  (05/24/2023)   Social Connection and Isolation Panel [NHANES]    Frequency of Communication with Friends and Family: More than three times a week    Frequency of Social Gatherings with Friends and Family: More than three times a week    Attends Religious Services: Never    Database administrator or Organizations: No    Attends Banker Meetings: Never    Marital Status: Married    Tobacco Counseling Counseling given: Not Answered   Clinical Intake:  Diabetic?         Activities of Daily Living    05/24/2023    1:50 PM  In your present state of health, do you have any difficulty performing the following activities:  Hearing? 0  Vision? 0  Difficulty concentrating or making decisions? 0  Walking or climbing stairs? 0  Dressing or bathing? 0  Doing errands, shopping? 0  Preparing Food and eating ? N  Using the Toilet? N  In the past six months, have you accidently leaked urine? N  Do you have problems with loss of bowel control? N  Managing your Medications? N  Managing your Finances? N  Housekeeping or managing your Housekeeping? N    Patient Care Team: Melida Quitter, PA as PCP - General (Family Medicine) Currie Paris, MD as Referring Physician (Orthopedic Surgery) Oretha Milch, MD as Consulting Physician (Pulmonary Disease) Juanell Fairly, RN as [facility] Care Management  Indicate any recent Medical Services you may have received from other than [facility] providers in the past year (date may be approximate).     Assessment:   This is a routine wellness examination for [place].  Hearing/Vision screen No results found.  Dietary issues and exercise activities discussed:     Goals Addressed   None    Depression Screen    05/24/2023    1:48 PM 04/28/2023   11:14 AM 12/23/2021    9:43 AM 08/24/2021    9:25 AM 08/27/2020    2:33 PM 07/02/2020    2:38 PM  PHQ 2/9 Scores  PHQ - 2 Score 0 1 2 0 0 2   PHQ- 9 Score 0 6 4 3 4 7     Fall Risk    05/24/2023    1:51 PM 04/28/2023   11:14 AM 03/23/2022   10:12 AM 12/23/2021    9:43 AM 08/24/2021    9:24 AM  Fall Risk   Falls in the past year? 0 0 0 0 0  Number falls in past yr: 0 0 0 0 0  Injury with Fall? 0 0 0 0 0  Risk for fall due to : No Fall Risks No Fall Risks No Fall Risks No Fall Risks No Fall Risks  Follow up Falls prevention discussed Falls evaluation completed Falls evaluation completed Falls evaluation completed Falls evaluation completed    FALL RISK PREVENTION PERTAINING TO THE HOME:  Any stairs in or around the home?  If so, are there any without handrails?  Home free of loose throw rugs in walkways, pet beds, electrical cords, etc?  Adequate lighting in your home to reduce risk of falls?   ASSISTIVE DEVICES UTILIZED TO PREVENT FALLS:  Life alert?  Use of a cane, walker or w/c?  Grab bars in the bathroom?  Shower chair or bench in shower?  Elevated toilet seat or a handicapped toilet?   TIMED UP AND GO:  Was the test performed? .  Length of time to ambulate 10 feet:  sec.    Cognitive Function:        05/24/2023    1:51 PM  6CIT Screen  What Year? 0 points  What month? 0 points  What time? 0 points  Count back from 20 0 points  Months in reverse 0 points  Repeat phrase 2 points  Total Score 2 points    Immunizations Immunization History  Administered Date(s) Administered   Influenza Split 09/27/2011   Influenza,inj,Quad PF,6+ Mos 10/24/2014, 09/17/2015, 10/01/2016, 11/06/2018  Influenza-Unspecified 09/05/2017   Td 04/26/2009   Tdap 06/27/2019            Qualifies for Shingles Vaccine?  Zostavax completed     Screening Tests Health Maintenance  Topic Date Due   COVID-19 Vaccine (1) 06/09/2023 (Originally [birth year]1980)   Hepatitis C Screening  05/23/2024 (Originally 12/23/1997)   INFLUENZA VACCINE  07/28/2023   Medicare Annual Wellness (AWV)  05/23/2024   DTaP/Tdap/Td (3 - Td or  Tdap) 06/26/2029   HIV Screening  Completed   HPV VACCINES  Aged Out    Health Maintenance  There are no preventive care reminders to display for this patient.    Lung Cancer Screening: (Low Dose CT Chest recommended if Age 79-80 years, 30 pack-year currently smoking OR have quit w/in 15years.)  qualify.   Lung Cancer Screening Referral:   Additional Screening:  Hepatitis C Screening:  qualify; Completed   Vision Screening: Recommended annual ophthalmology exams for early detection of glaucoma and other disorders of the eye. Is the patient up to date with their annual eye exam?   Who is the provider or what is the name of the office in which the patient attends annual eye exams?  If pt is not established with a provider, would they like to be referred to a provider to establish care? .   Dental Screening: Recommended annual dental exams for proper oral hygiene  Community Resource Referral / Chronic Care Management: CRR required this visit?    CCM required this visit?       Plan:     I have personally reviewed and noted the following in the patient's chart:   Medical and social history Use of alcohol, tobacco or illicit drugs  Current medications and supplements including opioid prescriptions.  Functional ability and status Nutritional status Physical activity Advanced directives List of other physicians Hospitalizations, surgeries, and ER visits in previous 12 months Vitals Screenings to include cognitive, depression, and falls Referrals and appointments  In addition, I have reviewed and discussed with patient certain preventive protocols, quality metrics, and best practice recommendations. A written personalized care plan for preventive services as well as general preventive health recommendations were provided to patient.     Tillie Rung, LPN   1/61/0960   Nurse Notes: This encounter was completed on 05/24/23    This encounter was created in error -  please disregard.

## 2023-05-26 NOTE — Patient Instructions (Signed)
Mr. Jordan [name] , Thank you for taking time to come for your Medicare Wellness Visit. I appreciate your ongoing commitment to your health goals. Please review the following plan we discussed and let me know if I can assist you in the future.   These are the goals we discussed:  Goals   None     This is a list of the screening recommended for you and due dates:  Health Maintenance  Topic Date Due   COVID-19 Vaccine (1) 06/09/2023*   Hepatitis C Screening  05/23/2024*   Flu Shot  07/28/2023   Medicare Annual Wellness Visit  05/25/2024   DTaP/Tdap/Td vaccine (3 - Td or Tdap) 06/26/2029   HIV Screening  Completed   HPV Vaccine  Aged Out  *Topic was postponed. The date shown is not the original due date.    Advanced directives:   Conditions/risks identified: None  Next appointment: Follow up in one year for your annual wellness visit   Preventive Care 40-64 Years, Male Preventive care refers to lifestyle choices and visits with your health care provider that can promote health and wellness. What does preventive care include? A yearly physical exam. This is also called an annual well check. Dental exams once or twice a year. Routine eye exams. Ask your health care provider how often you should have your eyes checked. Personal lifestyle choices, including: Daily care of your teeth and gums. Regular physical activity. Eating a healthy diet. Avoiding tobacco and drug use. Limiting alcohol use. Practicing safe sex. Taking low-dose aspirin every day starting at age 98. What happens during an annual well check? The services and screenings done by your health care provider during your annual well check will depend on your age, overall health, lifestyle risk factors, and family history of disease. Counseling  Your health care provider may ask you questions about your: Alcohol use. Tobacco use. Drug use. Emotional well-being. Home and relationship well-being. Sexual  activity. Eating habits. Work and work Astronomer. Screening  You may have the following tests or measurements: Height, weight, and BMI. Blood pressure. Lipid and cholesterol levels. These may be checked every 5 years, or more frequently if you are over 46 years old. Skin check. Lung cancer screening. You may have this screening every year starting at age 63 if you have a 30-pack-year history of smoking and currently smoke or have quit within the past 15 years. Fecal occult blood test (FOBT) of the stool. You may have this test every year starting at age 63. Flexible sigmoidoscopy or colonoscopy. You may have a sigmoidoscopy every 5 years or a colonoscopy every 10 years starting at age 72. Prostate cancer screening. Recommendations will vary depending on your family history and other risks. Hepatitis C blood test. Hepatitis B blood test. Sexually transmitted disease (STD) testing. Diabetes screening. This is done by checking your blood sugar (glucose) after you have not eaten for a while (fasting). You may have this done every 1-3 years. Discuss your test results, treatment options, and if necessary, the need for more tests with your health care provider. Vaccines  Your health care provider may recommend certain vaccines, such as: Influenza vaccine. This is recommended every year. Tetanus, diphtheria, and acellular pertussis (Tdap, Td) vaccine. You may need a Td booster every 10 years. Zoster vaccine. You may need this after age 26. Pneumococcal 13-valent conjugate (PCV13) vaccine. You may need this if you have certain conditions and have not been vaccinated. Pneumococcal polysaccharide (PPSV23) vaccine. You may need one or  two doses if you smoke cigarettes or if you have certain conditions. Talk to your health care provider about which screenings and vaccines you need and how often you need them. This information is not intended to replace advice given to you by your health care provider.  Make sure you discuss any questions you have with your health care provider. Document Released: 01/09/2016 Document Revised: 09/01/2016 Document Reviewed: 10/14/2015 Elsevier Interactive Patient Education  2017 [employer].  Fall Prevention in the Home Falls can cause injuries. They can happen to people of all ages. There are many things you can do to make your home safe and to help prevent falls. What can I do on the outside of my home? Regularly fix the edges of walkways and driveways and fix any cracks. Remove anything that might make you trip as you walk through a door, such as a raised step or threshold. Trim any bushes or trees on the path to your home. Use bright outdoor lighting. Clear any walking paths of anything that might make someone trip, such as rocks or tools. Regularly check to see if handrails are loose or broken. Make sure that both sides of any steps have handrails. Any raised decks and porches should have guardrails on the edges. Have any leaves, snow, or ice cleared regularly. Use sand or salt on walking paths during winter. Clean up any spills in your garage right away. This includes oil or grease spills. What can I do in the bathroom? Use night lights. Install grab bars by the toilet and in the tub and shower. Do not use towel bars as grab bars. Use non-skid mats or decals in the tub or shower. If you need to sit down in the shower, use a plastic, non-slip stool. Keep the floor dry. Clean up any water that spills on the floor as soon as it happens. Remove soap buildup in the tub or shower regularly. Attach bath mats securely with double-sided non-slip rug tape. Do not have throw rugs and other things on the floor that can make you trip. What can I do in the bedroom? Use night lights. Make sure that you have a light by your bed that is easy to reach. Do not use any sheets or blankets that are too big for your bed. They should not hang down onto the floor. Have a  firm chair that has side arms. You can use this for support while you get dressed. Do not have throw rugs and other things on the floor that can make you trip. What can I do in the kitchen? Clean up any spills right away. Avoid walking on wet floors. Keep items that you use a lot in easy-to-reach places. If you need to reach something above you, use a strong step stool that has a grab bar. Keep electrical cords out of the way. Do not use floor polish or wax that makes floors slippery. If you must use wax, use non-skid floor wax. Do not have throw rugs and other things on the floor that can make you trip. What can I do with my stairs? Do not leave any items on the stairs. Make sure that there are handrails on both sides of the stairs and use them. Fix handrails that are broken or loose. Make sure that handrails are as long as the stairways. Check any carpeting to make sure that it is firmly attached to the stairs. Fix any carpet that is loose or worn. Avoid having throw rugs at  the top or bottom of the stairs. If you do have throw rugs, attach them to the floor with carpet tape. Make sure that you have a light switch at the top of the stairs and the bottom of the stairs. If you do not have them, ask someone to add them for you. What else can I do to help prevent falls? Wear shoes that: Do not have high heels. Have rubber bottoms. Are comfortable and fit you well. Are closed at the toe. Do not wear sandals. If you use a stepladder: Make sure that it is fully opened. Do not climb a closed stepladder. Make sure that both sides of the stepladder are locked into place. Ask someone to hold it for you, if possible. Clearly mark and make sure that you can see: Any grab bars or handrails. First and last steps. Where the edge of each step is. Use tools that help you move around (mobility aids) if they are needed. These include: Canes. Walkers. Scooters. Crutches. Turn on the lights when you  go into a dark area. Replace any light bulbs as soon as they burn out. Set up your furniture so you have a clear path. Avoid moving your furniture around. If any of your floors are uneven, fix them. If there are any pets around you, be aware of where they are. Review your medicines with your doctor. Some medicines can make you feel dizzy. This can increase your chance of falling. Ask your doctor what other things that you can do to help prevent falls. This information is not intended to replace advice given to you by your health care provider. Make sure you discuss any questions you have with your health care provider. Document Released: 10/09/2009 Document Revised: 05/20/2016 Document Reviewed: 01/17/2015 Elsevier Interactive Patient Education  2017 [employer].

## 2023-05-26 NOTE — Telephone Encounter (Signed)
This encounter was created an error. AWV visit was completed on 05/24/23

## 2023-05-30 DIAGNOSIS — Z933 Colostomy status: Secondary | ICD-10-CM | POA: Diagnosis not present

## 2023-07-13 ENCOUNTER — Other Ambulatory Visit: Payer: Self-pay

## 2023-07-13 DIAGNOSIS — Z0000 Encounter for general adult medical examination without abnormal findings: Secondary | ICD-10-CM

## 2023-07-13 DIAGNOSIS — Z130 Encounter for screening for diseases of the blood and blood-forming organs and certain disorders involving the immune mechanism: Secondary | ICD-10-CM

## 2023-07-27 ENCOUNTER — Other Ambulatory Visit: Payer: Medicare HMO

## 2023-08-04 ENCOUNTER — Encounter: Payer: Self-pay | Admitting: Family Medicine

## 2023-08-04 ENCOUNTER — Ambulatory Visit (INDEPENDENT_AMBULATORY_CARE_PROVIDER_SITE_OTHER): Payer: Medicare HMO | Admitting: Family Medicine

## 2023-08-04 ENCOUNTER — Encounter: Payer: Medicare HMO | Admitting: Family Medicine

## 2023-08-04 VITALS — BP 132/88 | HR 85 | Ht 66.0 in | Wt 276.0 lb

## 2023-08-04 DIAGNOSIS — I10 Essential (primary) hypertension: Secondary | ICD-10-CM | POA: Diagnosis not present

## 2023-08-04 DIAGNOSIS — Z6841 Body Mass Index (BMI) 40.0 and over, adult: Secondary | ICD-10-CM

## 2023-08-04 MED ORDER — BLOOD PRESSURE MONITOR DEVI: 0 refills | Status: DC

## 2023-08-04 NOTE — Assessment & Plan Note (Signed)
Has lost ~ 15  lbs from last visit.  Eating diet of fresh vegetables mainly.  Encouraged continued weight loss.  Goal < 200 lbs for surgery.

## 2023-08-04 NOTE — Progress Notes (Signed)
   Established Patient Office Visit  Subjective   Patient ID: Jordan [name] Current III, male    DOB: 1981/10/02  Age: 42 y.o. MRN: [id number]  Chief Complaint  Patient presents with  . Annual Exam    HPI  HTN-pt has not yet started his valsartan.  Trying to improve bp through diet and exercise.   He does not measure his bp at home.    Weight loss-.  Pt is eating mostly what he grows from his garden including cucumbers, squash, potatoes, beans.  Drinks a lot of water.  Drinks 2 beers/night.    Pt asked about cologuard. He has an ostomy from colectomy for diverticulosis in 2020.  Plans to eventually have reversal but will need to be under 200 lbs. Has an uncle with colon cancer diagnosed in his 16s.      The 10-year ASCVD risk score (Arnett DK, et al., 2019) is: 1%  Health Maintenance Due  Topic Date Due  . Hepatitis C Screening  Never done  . COVID-19 Vaccine (1 - 2023-24 season) Never done  . INFLUENZA VACCINE  07/28/2023      Objective:     BP 132/88   Pulse 85   Ht 5\' 6"  (1.676 m)   Wt 276 lb (125.2 kg)   SpO2 98%   BMI 44.55 kg/m    Physical Exam Gen: alert, oriented Cv: rrr Pulm: lctab Gi: normal appearing ostomy Ext: lipedema b/l.    No results found for any visits on 08/04/23.      Assessment & Plan:   HYPERTENSION, MILD Assessment & Plan: Has not started his ARB.  Bp better today.  Sent in rx for bp cuff.  Advised pt to check bp daily.  Discussed goal of 130/80.     Class 3 severe obesity due to excess calories with serious comorbidity and body mass index (BMI) of 45.0 to 49.9 in adult [facility]) Assessment & Plan: Has lost ~ 15  lbs from last visit.  Eating diet of fresh vegetables mainly.  Encouraged continued weight loss.  Goal < 200 lbs for surgery.     Other orders -     Blood Pressure Monitor; Check bp daily in the am  Dispense: 1 each; Refill: 0     Return in about 6 months (around 02/04/2024) for HTN.    Sandre Kitty, MD

## 2023-08-04 NOTE — Assessment & Plan Note (Signed)
Has not started his ARB.  Bp better today.  Sent in rx for bp cuff.  Advised pt to check bp daily.  Discussed goal of 130/80.

## 2023-08-04 NOTE — Patient Instructions (Signed)
It was nice to see you today,  We addressed the following topics today: -I will send in a blood pressure cuff to [place] [facility].they will be able to tell you if it is covered by insurance.  - please continue to eat a healthy diet and get at least 30 minutes of exercise a day.    Have a great day,  Frederic Jericho, MD

## 2023-08-09 ENCOUNTER — Other Ambulatory Visit: Payer: Medicare HMO

## 2023-08-09 DIAGNOSIS — Z130 Encounter for screening for diseases of the blood and blood-forming organs and certain disorders involving the immune mechanism: Secondary | ICD-10-CM | POA: Diagnosis not present

## 2023-08-09 DIAGNOSIS — Z1321 Encounter for screening for nutritional disorder: Secondary | ICD-10-CM | POA: Diagnosis not present

## 2023-08-09 DIAGNOSIS — Z13228 Encounter for screening for other metabolic disorders: Secondary | ICD-10-CM | POA: Diagnosis not present

## 2023-08-09 DIAGNOSIS — Z1159 Encounter for screening for other viral diseases: Secondary | ICD-10-CM

## 2023-08-09 DIAGNOSIS — Z0000 Encounter for general adult medical examination without abnormal findings: Secondary | ICD-10-CM | POA: Diagnosis not present

## 2023-08-09 DIAGNOSIS — Z1329 Encounter for screening for other suspected endocrine disorder: Secondary | ICD-10-CM | POA: Diagnosis not present

## 2023-08-23 DIAGNOSIS — K631 Perforation of intestine (nontraumatic): Secondary | ICD-10-CM | POA: Diagnosis not present

## 2023-08-23 DIAGNOSIS — Z933 Colostomy status: Secondary | ICD-10-CM | POA: Diagnosis not present

## 2023-08-25 DIAGNOSIS — Z933 Colostomy status: Secondary | ICD-10-CM | POA: Diagnosis not present

## 2023-08-25 DIAGNOSIS — K631 Perforation of intestine (nontraumatic): Secondary | ICD-10-CM | POA: Diagnosis not present

## 2023-09-22 DIAGNOSIS — Z933 Colostomy status: Secondary | ICD-10-CM | POA: Diagnosis not present

## 2023-09-22 DIAGNOSIS — K631 Perforation of intestine (nontraumatic): Secondary | ICD-10-CM | POA: Diagnosis not present

## 2023-10-25 DIAGNOSIS — H5203 Hypermetropia, bilateral: Secondary | ICD-10-CM | POA: Diagnosis not present

## 2023-11-05 ENCOUNTER — Other Ambulatory Visit: Payer: Self-pay

## 2023-11-05 ENCOUNTER — Inpatient Hospital Stay (HOSPITAL_COMMUNITY)
Admission: EM | Admit: 2023-11-05 | Discharge: 2023-11-09 | DRG: 394 | Disposition: A | Discharge status: Home / Self Care | Payer: Medicare HMO | Attending: Internal Medicine | Admitting: Internal Medicine

## 2023-11-05 DIAGNOSIS — G894 Chronic pain syndrome: Secondary | ICD-10-CM | POA: Diagnosis present

## 2023-11-05 DIAGNOSIS — Z6841 Body Mass Index (BMI) 40.0 and over, adult: Secondary | ICD-10-CM | POA: Diagnosis not present

## 2023-11-05 DIAGNOSIS — K433 Parastomal hernia with obstruction, without gangrene: Secondary | ICD-10-CM | POA: Diagnosis present

## 2023-11-05 DIAGNOSIS — E66813 Obesity, class 3: Secondary | ICD-10-CM | POA: Diagnosis present

## 2023-11-05 DIAGNOSIS — K219 Gastro-esophageal reflux disease without esophagitis: Secondary | ICD-10-CM | POA: Diagnosis present

## 2023-11-05 DIAGNOSIS — Z933 Colostomy status: Secondary | ICD-10-CM

## 2023-11-05 DIAGNOSIS — K56609 Unspecified intestinal obstruction, unspecified as to partial versus complete obstruction: Secondary | ICD-10-CM | POA: Diagnosis present

## 2023-11-05 DIAGNOSIS — K5669 Other partial intestinal obstruction: Secondary | ICD-10-CM | POA: Diagnosis not present

## 2023-11-05 DIAGNOSIS — Z7982 Long term (current) use of aspirin: Secondary | ICD-10-CM | POA: Diagnosis not present

## 2023-11-05 DIAGNOSIS — K5730 Diverticulosis of large intestine without perforation or abscess without bleeding: Secondary | ICD-10-CM | POA: Diagnosis not present

## 2023-11-05 DIAGNOSIS — Z8249 Family history of ischemic heart disease and other diseases of the circulatory system: Secondary | ICD-10-CM

## 2023-11-05 DIAGNOSIS — Z79899 Other long term (current) drug therapy: Secondary | ICD-10-CM | POA: Diagnosis not present

## 2023-11-05 DIAGNOSIS — Z4682 Encounter for fitting and adjustment of non-vascular catheter: Secondary | ICD-10-CM | POA: Diagnosis not present

## 2023-11-05 DIAGNOSIS — Z96651 Presence of right artificial knee joint: Secondary | ICD-10-CM | POA: Diagnosis present

## 2023-11-05 DIAGNOSIS — Z888 Allergy status to other drugs, medicaments and biological substances status: Secondary | ICD-10-CM | POA: Diagnosis not present

## 2023-11-05 DIAGNOSIS — J45909 Unspecified asthma, uncomplicated: Secondary | ICD-10-CM | POA: Diagnosis present

## 2023-11-05 DIAGNOSIS — X500XXA Overexertion from strenuous movement or load, initial encounter: Secondary | ICD-10-CM

## 2023-11-05 DIAGNOSIS — Z87891 Personal history of nicotine dependence: Secondary | ICD-10-CM | POA: Diagnosis not present

## 2023-11-05 DIAGNOSIS — R1012 Left upper quadrant pain: Secondary | ICD-10-CM | POA: Diagnosis not present

## 2023-11-05 DIAGNOSIS — J8283 Eosinophilic asthma: Secondary | ICD-10-CM | POA: Diagnosis present

## 2023-11-05 DIAGNOSIS — K8689 Other specified diseases of pancreas: Secondary | ICD-10-CM | POA: Diagnosis not present

## 2023-11-05 DIAGNOSIS — I10 Essential (primary) hypertension: Secondary | ICD-10-CM | POA: Diagnosis present

## 2023-11-05 DIAGNOSIS — K828 Other specified diseases of gallbladder: Secondary | ICD-10-CM | POA: Diagnosis not present

## 2023-11-05 DIAGNOSIS — Z8719 Personal history of other diseases of the digestive system: Secondary | ICD-10-CM

## 2023-11-05 DIAGNOSIS — Z881 Allergy status to other antibiotic agents status: Secondary | ICD-10-CM | POA: Diagnosis not present

## 2023-11-05 DIAGNOSIS — Z9049 Acquired absence of other specified parts of digestive tract: Secondary | ICD-10-CM

## 2023-11-05 DIAGNOSIS — R1013 Epigastric pain: Secondary | ICD-10-CM | POA: Diagnosis not present

## 2023-11-05 LAB — COMPREHENSIVE METABOLIC PANEL
ALT: 17 U/L (ref 0–44)
AST: 19 U/L (ref 15–41)
Albumin: 3.8 g/dL (ref 3.5–5.0)
Alkaline Phosphatase: 61 U/L (ref 38–126)
Anion gap: 10 (ref 5–15)
BUN: <5 mg/dL — ABNORMAL LOW (ref 6–20)
CO2: 26 mmol/L (ref 22–32)
Calcium: 9.4 mg/dL (ref 8.9–10.3)
Chloride: 100 mmol/L (ref 98–111)
Creatinine, Ser: 1.09 mg/dL (ref 0.61–1.24)
GFR, Estimated: >60 mL/min (ref >60)
Glucose, Bld: 116 mg/dL — ABNORMAL HIGH (ref 70–99)
Potassium: 4.2 mmol/L (ref 3.5–5.1)
Sodium: 136 mmol/L (ref 135–145)
Total Bilirubin: 1 mg/dL (ref <1.2)
Total Protein: 6.9 g/dL (ref 6.5–8.1)

## 2023-11-05 LAB — CBC
HCT: 45.5 % (ref 39.0–52.0)
Hemoglobin: 14.1 g/dL (ref 13.0–17.0)
MCH: 27 pg (ref 26.0–34.0)
MCHC: 31 g/dL (ref 30.0–36.0)
MCV: 87.2 fL (ref 80.0–100.0)
Platelets: 273 10*3/uL (ref 150–400)
RBC: 5.22 MIL/uL (ref 4.22–5.81)
RDW: 13.5 % (ref 11.5–15.5)
WBC: 10.3 10*3/uL (ref 4.0–10.5)
nRBC: 0 % (ref 0.0–0.2)

## 2023-11-05 LAB — URINALYSIS, ROUTINE W REFLEX MICROSCOPIC
Bilirubin Urine: NEGATIVE
Glucose, UA: NEGATIVE mg/dL
Hgb urine dipstick: NEGATIVE
Ketones, ur: 20 mg/dL — AB
Leukocytes,Ua: NEGATIVE
Nitrite: NEGATIVE
Protein, ur: 100 mg/dL — AB
Specific Gravity, Urine: 1.019 (ref 1.005–1.030)
pH: 6 (ref 5.0–8.0)

## 2023-11-05 LAB — LIPASE, BLOOD: Lipase: 23 U/L (ref 11–51)

## 2023-11-05 MED ORDER — ONDANSETRON 4 MG PO TBDP: 4.0000 mg | ORAL_TABLET | Freq: Once | ORAL | Status: DC | PRN

## 2023-11-05 MED ORDER — ONDANSETRON HCL 4 MG/2ML IJ SOLN
4.0000 mg | Freq: Once | INTRAMUSCULAR | Status: AC
  Administered 2023-11-06: 4 mg via INTRAVENOUS
  Filled 2023-11-05: qty 2

## 2023-11-05 MED ORDER — MORPHINE SULFATE (PF) 4 MG/ML IV SOLN
4.0000 mg | Freq: Once | INTRAVENOUS | Status: AC
  Administered 2023-11-06: 4 mg via INTRAVENOUS
  Filled 2023-11-05: qty 1

## 2023-11-05 NOTE — ED Triage Notes (Signed)
Pt has hernia and lifted a heavy box on Thursday since then pain to hernia has increased. Has not been able to keep anything down. Emesis x4 a day. LBM was Thursday before injury. Pain and hernia is to LUQ. Endorses NV since thurday

## 2023-11-05 NOTE — ED Provider Triage Note (Signed)
Emergency Medicine Provider Triage Evaluation Note  Jordan [name] , a 42 y.o. male  was evaluated in triage.  Pt complains of abdominal pain, emesis.  Patient reports worsening abdominal pain and bilious emesis since Thursday.  No ostomy output.  History of incarcerated hernia in the past.  Review of Systems  Positive: Vomiting, abdominal pain, no ostomy output Negative: Chest pain  Physical Exam  BP (!) 157/105 (BP Location: Right Arm)   Pulse 70   Temp 97.6 F (36.4 C)   Resp 16   Ht 1.676 m (5\' 6" )   Wt 124.7 kg   SpO2 97%   BMI 44.39 kg/m  Gen:   Awake, no distress   Resp:  Normal effort  MSK:   Moves extremities without difficulty  Other:  Empty ostomy bag, tenderness to palpation left upper quadrant  Medical Decision Making  Medically screening exam initiated at 11:58 PM.  Appropriate orders placed.  Randel Pigg Pender [name] was informed that the remainder of the evaluation will be completed by another provider, this initial triage assessment does not replace that evaluation, and the importance of remaining in the ED until their evaluation is complete.     Shon Baton, MD 11/05/23 [phone]

## 2023-11-06 ENCOUNTER — Emergency Department (HOSPITAL_COMMUNITY): Payer: Medicare HMO

## 2023-11-06 ENCOUNTER — Encounter (HOSPITAL_COMMUNITY): Payer: Self-pay | Admitting: Internal Medicine

## 2023-11-06 ENCOUNTER — Inpatient Hospital Stay (HOSPITAL_COMMUNITY): Payer: Medicare HMO

## 2023-11-06 DIAGNOSIS — K219 Gastro-esophageal reflux disease without esophagitis: Secondary | ICD-10-CM | POA: Diagnosis present

## 2023-11-06 DIAGNOSIS — I10 Essential (primary) hypertension: Secondary | ICD-10-CM

## 2023-11-06 DIAGNOSIS — Z79899 Other long term (current) drug therapy: Secondary | ICD-10-CM | POA: Diagnosis not present

## 2023-11-06 DIAGNOSIS — Z881 Allergy status to other antibiotic agents status: Secondary | ICD-10-CM | POA: Diagnosis not present

## 2023-11-06 DIAGNOSIS — J45909 Unspecified asthma, uncomplicated: Secondary | ICD-10-CM

## 2023-11-06 DIAGNOSIS — Z6841 Body Mass Index (BMI) 40.0 and over, adult: Secondary | ICD-10-CM | POA: Diagnosis not present

## 2023-11-06 DIAGNOSIS — Z96651 Presence of right artificial knee joint: Secondary | ICD-10-CM | POA: Diagnosis present

## 2023-11-06 DIAGNOSIS — K56609 Unspecified intestinal obstruction, unspecified as to partial versus complete obstruction: Secondary | ICD-10-CM | POA: Diagnosis present

## 2023-11-06 DIAGNOSIS — Z8249 Family history of ischemic heart disease and other diseases of the circulatory system: Secondary | ICD-10-CM | POA: Diagnosis not present

## 2023-11-06 DIAGNOSIS — Z87891 Personal history of nicotine dependence: Secondary | ICD-10-CM | POA: Diagnosis not present

## 2023-11-06 DIAGNOSIS — X500XXA Overexertion from strenuous movement or load, initial encounter: Secondary | ICD-10-CM | POA: Diagnosis not present

## 2023-11-06 DIAGNOSIS — Z888 Allergy status to other drugs, medicaments and biological substances status: Secondary | ICD-10-CM | POA: Diagnosis not present

## 2023-11-06 DIAGNOSIS — E66813 Obesity, class 3: Secondary | ICD-10-CM | POA: Diagnosis present

## 2023-11-06 DIAGNOSIS — K433 Parastomal hernia with obstruction, without gangrene: Secondary | ICD-10-CM | POA: Diagnosis present

## 2023-11-06 DIAGNOSIS — Z933 Colostomy status: Secondary | ICD-10-CM | POA: Diagnosis not present

## 2023-11-06 DIAGNOSIS — G894 Chronic pain syndrome: Secondary | ICD-10-CM | POA: Diagnosis present

## 2023-11-06 DIAGNOSIS — J8283 Eosinophilic asthma: Secondary | ICD-10-CM | POA: Diagnosis present

## 2023-11-06 DIAGNOSIS — Z9049 Acquired absence of other specified parts of digestive tract: Secondary | ICD-10-CM | POA: Diagnosis not present

## 2023-11-06 DIAGNOSIS — Z7982 Long term (current) use of aspirin: Secondary | ICD-10-CM | POA: Diagnosis not present

## 2023-11-06 LAB — GLUCOSE, CAPILLARY: Glucose-Capillary: 100 mg/dL — ABNORMAL HIGH (ref 70–99)

## 2023-11-06 LAB — COMPREHENSIVE METABOLIC PANEL
ALT: 16 U/L (ref 0–44)
AST: 16 U/L (ref 15–41)
Albumin: 3.5 g/dL (ref 3.5–5.0)
Alkaline Phosphatase: 56 U/L (ref 38–126)
Anion gap: 9 (ref 5–15)
BUN: <5 mg/dL — ABNORMAL LOW (ref 6–20)
CO2: 26 mmol/L (ref 22–32)
Calcium: 8.8 mg/dL — ABNORMAL LOW (ref 8.9–10.3)
Chloride: 100 mmol/L (ref 98–111)
Creatinine, Ser: 0.94 mg/dL (ref 0.61–1.24)
GFR, Estimated: >60 mL/min (ref >60)
Glucose, Bld: 114 mg/dL — ABNORMAL HIGH (ref 70–99)
Potassium: 3.8 mmol/L (ref 3.5–5.1)
Sodium: 135 mmol/L (ref 135–145)
Total Bilirubin: 0.9 mg/dL (ref <1.2)
Total Protein: 6.5 g/dL (ref 6.5–8.1)

## 2023-11-06 LAB — CBC
HCT: 42.3 % (ref 39.0–52.0)
Hemoglobin: 13.3 g/dL (ref 13.0–17.0)
MCH: 27 pg (ref 26.0–34.0)
MCHC: 31.4 g/dL (ref 30.0–36.0)
MCV: 85.8 fL (ref 80.0–100.0)
Platelets: 257 10*3/uL (ref 150–400)
RBC: 4.93 MIL/uL (ref 4.22–5.81)
RDW: 13.5 % (ref 11.5–15.5)
WBC: 10.8 10*3/uL — ABNORMAL HIGH (ref 4.0–10.5)
nRBC: 0 % (ref 0.0–0.2)

## 2023-11-06 LAB — CBG MONITORING, ED
Glucose-Capillary: 99 mg/dL (ref 70–99)
Glucose-Capillary: 132 mg/dL — ABNORMAL HIGH (ref 70–99)

## 2023-11-06 LAB — HIV ANTIBODY (ROUTINE TESTING W REFLEX): HIV Screen 4th Generation wRfx: NONREACTIVE

## 2023-11-06 MED ORDER — DIATRIZOATE MEGLUMINE & SODIUM 66-10 % PO SOLN
90.0000 mL | Freq: Once | ORAL | Status: AC
Start: 2023-11-06 — End: 2023-11-06
  Administered 2023-11-06: 90 mL via NASOGASTRIC
  Filled 2023-11-06: qty 90

## 2023-11-06 MED ORDER — ONDANSETRON HCL 4 MG/2ML IJ SOLN
4.0000 mg | Freq: Four times a day (QID) | INTRAMUSCULAR | Status: DC | PRN
  Administered 2023-11-06 – 2023-11-07 (×8): 4 mg via INTRAVENOUS
  Filled 2023-11-06 (×8): qty 2

## 2023-11-06 MED ORDER — ACETAMINOPHEN 650 MG RE SUPP: 650.0000 mg | Freq: Four times a day (QID) | RECTAL | Status: DC | PRN

## 2023-11-06 MED ORDER — ACETAMINOPHEN 325 MG PO TABS: 650.0000 mg | ORAL_TABLET | Freq: Four times a day (QID) | ORAL | Status: DC | PRN

## 2023-11-06 MED ORDER — HYDROMORPHONE HCL 1 MG/ML IJ SOLN
1.0000 mg | INTRAMUSCULAR | Status: DC | PRN
  Administered 2023-11-06 (×2): 1 mg via INTRAVENOUS
  Administered 2023-11-06 – 2023-11-07 (×8): 2 mg via INTRAVENOUS
  Administered 2023-11-08: 1 mg via INTRAVENOUS
  Administered 2023-11-08 (×2): 2 mg via INTRAVENOUS
  Filled 2023-11-06 (×3): qty 2
  Filled 2023-11-06: qty 1
  Filled 2023-11-06 (×7): qty 2
  Filled 2023-11-06: qty 1

## 2023-11-06 MED ORDER — LACTATED RINGERS IV SOLN: INTRAVENOUS | Status: DC

## 2023-11-06 MED ORDER — IOHEXOL 350 MG/ML SOLN
75.0000 mL | Freq: Once | INTRAVENOUS | Status: AC | PRN
  Administered 2023-11-06: 75 mL via INTRAVENOUS

## 2023-11-06 MED ORDER — SODIUM CHLORIDE 0.9% FLUSH
3.0000 mL | Freq: Two times a day (BID) | INTRAVENOUS | Status: DC
  Administered 2023-11-06 – 2023-11-09 (×5): 3 mL via INTRAVENOUS

## 2023-11-06 MED ORDER — HYDRALAZINE HCL 20 MG/ML IJ SOLN: 10.0000 mg | Freq: Three times a day (TID) | INTRAMUSCULAR | Status: DC | PRN

## 2023-11-06 MED ORDER — SODIUM CHLORIDE 0.9% FLUSH
3.0000 mL | Freq: Two times a day (BID) | INTRAVENOUS | Status: DC
  Administered 2023-11-06 – 2023-11-07 (×3): 3 mL via INTRAVENOUS

## 2023-11-06 MED ORDER — HYDROMORPHONE HCL 1 MG/ML IJ SOLN
1.0000 mg | INTRAMUSCULAR | Status: DC
  Filled 2023-11-06: qty 1

## 2023-11-06 MED ORDER — SODIUM CHLORIDE 0.9% FLUSH: 3.0000 mL | INTRAVENOUS | Status: DC | PRN

## 2023-11-06 MED ORDER — SODIUM CHLORIDE 0.9 % IV SOLN: 250.0000 mL | INTRAVENOUS | Status: DC | PRN

## 2023-11-06 MED ORDER — ALBUTEROL SULFATE (2.5 MG/3ML) 0.083% IN NEBU: 3.0000 mL | INHALATION_SOLUTION | Freq: Four times a day (QID) | RESPIRATORY_TRACT | Status: DC | PRN

## 2023-11-06 MED ORDER — FENTANYL CITRATE PF 50 MCG/ML IJ SOSY
12.5000 ug | PREFILLED_SYRINGE | INTRAMUSCULAR | Status: DC | PRN
  Administered 2023-11-06 (×2): 12.5 ug via INTRAVENOUS
  Filled 2023-11-06 (×2): qty 1

## 2023-11-06 NOTE — Consult Note (Signed)
WOC consulted for ostomy, patient has had ostomy since 2021, has had SBO in the past.  Provided staff with [employer] #s for needed supplies, patient can change based on his home schedule. If patient does not use [employer] pouches, can have family bring in preferred pouches for use while inpatient.   No additional teaching needed.    Re consult if needed, will not follow at this time. Thanks  Jacolyn Joaquin [employer], RN,CWOCN, CNS, CWON-AP [phone])

## 2023-11-06 NOTE — ED Provider Notes (Signed)
[place] EMERGENCY DEPARTMENT AT [facility] Provider Note   CSN: [id number] Arrival date & time: 11/05/23  2242     History  Chief Complaint  Patient presents with   Abdominal Pain    Hernia     Jordan [name] is a 42 y.o. male.  The history is provided by the patient, the spouse and medical records. No language interpreter was used.  Abdominal Pain    42 year old male with prior history of diverticulitis with complication status post colostomy since 2021 who presents with complaint of abdominal pain.  Patient report for the past 3 days he has had persistent nausea, has vomited bilious contents, and noticed no output to his ostomy bag.  He felt his pain is more of a crampy and sharp sensation primarily on the left upper abdomen.  Pain is moderate to severe.  Endorsed chills without fever no chest pain or shortness of breath no productive cough and no urinary symptoms.  Has history of incarcerated hernia in the past.  He worries that he may have had either a GI bug versus "clogged up".  He still has an intact gallbladder.  Home Medications Prior to Admission medications   Medication Sig Start Date End Date Taking? Authorizing Provider  albuterol (VENTOLIN HFA) 108 (90 Base) MCG/ACT inhaler Inhale 2 puffs into the lungs every 6 (six) hours as needed for wheezing or shortness of breath. 04/28/23   Melida Quitter, PA  aspirin 81 MG tablet Take 81 mg by mouth daily.     [provider]  Blood Pressure Monitor DEVI Check bp daily in the am 08/04/23   Sandre Kitty, MD  cetirizine (ZYRTEC) 10 MG tablet Take 1 tablet (10 mg total) by mouth daily. 03/23/22   Mayer Masker, PA-C  valsartan (DIOVAN) 80 MG tablet Take 1 tablet (80 mg total) by mouth daily. 04/28/23   Melida Quitter, PA      Allergies    Butrans [buprenorphine hcl], Nucynta [tapentadol hydrochloride], Tapentadol hcl, Glucose polymer, Sumac, and Bactrim [sulfamethoxazole-trimethoprim]     Review of Systems   Review of Systems  Gastrointestinal:  Positive for abdominal pain.  All other systems reviewed and are negative.   Physical Exam Updated Vital Signs BP (!) 157/105 (BP Location: Right Arm)   Pulse 70   Temp 97.6 F (36.4 C)   Resp 16   Ht 5\' 6"  (1.676 m)   Wt 124.7 kg   SpO2 97%   BMI 44.39 kg/m  Physical Exam Vitals and nursing note reviewed.  Constitutional:      General: He is not in acute distress.    Appearance: He is well-developed. He is obese.     Comments: Appears uncomfortable  HENT:     Head: Atraumatic.  Eyes:     Conjunctiva/sclera: Conjunctivae normal.  Cardiovascular:     Rate and Rhythm: Normal rate and regular rhythm.     Heart sounds: Normal heart sounds.  Pulmonary:     Effort: Pulmonary effort is normal.     Breath sounds: Normal breath sounds.  Abdominal:     General: Bowel sounds are decreased.     Palpations: Abdomen is soft.     Tenderness: There is abdominal tenderness in the epigastric area and left upper quadrant.     Hernia: No hernia is present.  Musculoskeletal:     Cervical back: Neck supple.  Skin:    Findings: No rash.  Neurological:     Mental Status:  He is alert.     ED Results / Procedures / Treatments   Labs (all labs ordered are listed, but only abnormal results are displayed) Labs Reviewed  COMPREHENSIVE METABOLIC PANEL - Abnormal; Notable for the following components:      Result Value   Glucose, Bld 116 (*)    BUN <5 (*)    All other components within normal limits  URINALYSIS, ROUTINE W REFLEX MICROSCOPIC - Abnormal; Notable for the following components:   APPearance HAZY (*)    Ketones, ur 20 (*)    Protein, ur 100 (*)    Bacteria, UA FEW (*)    All other components within normal limits  LIPASE, BLOOD  CBC    EKG None  Radiology CT ABDOMEN PELVIS W CONTRAST  Result Date: 11/06/2023 CLINICAL DATA:  Abdominal pain, emesis. No ostomy output. History of incarcerated hernia. EXAM:  CT ABDOMEN AND PELVIS WITH CONTRAST TECHNIQUE: Multidetector CT imaging of the abdomen and pelvis was performed using the standard protocol following bolus administration of intravenous contrast. RADIATION DOSE REDUCTION: This exam was performed according to the departmental dose-optimization program which includes automated exposure control, adjustment of the mA and/or kV according to patient size and/or use of iterative reconstruction technique. CONTRAST:  75mL OMNIPAQUE IOHEXOL 350 MG/ML SOLN COMPARISON:  CT abdomen and pelvis 12/01/2020 FINDINGS: Lower chest: No acute abnormality. Hepatobiliary: Unremarkable. Pancreas: Mild fatty atrophy without acute abnormality. Spleen: Unremarkable. Adrenals/Urinary Tract: Stable adrenal glands. No urinary calculi or hydronephrosis. Unremarkable bladder. Stomach/Bowel: Stomach is within normal limits. Hartmann's pouch. Colonic diverticulosis without diverticulitis. Normal appendix. Small bowel dilation with air-fluid levels and transition point at the entry into the left parastomal hernia (circa series 3/image 50). The small bowel within the hernia sac is also dilated. Questionable area of focal small bowel wall thickening versus peristalsis in the low abdomen (series 6/image 97 and series 3/image 72). Vascular/Lymphatic: No significant vascular findings are present. No enlarged abdominal or pelvic lymph nodes. Reproductive: Unremarkable. Other: Small volume free fluid in the hernia sac and pelvis. No free intraperitoneal air. Musculoskeletal: No acute fracture. IMPRESSION: 1. Small bowel obstruction with transition point at the entry into the left parastomal hernia. The small bowel within the hernia sac is also dilated. Surgical consult recommended. 2. Question focal enteritis with stricture in the low anterior abdomen versus peristalsis. 3. Small volume free fluid in the hernia sac and pelvis. Electronically Signed   By: Minerva Fester M.D.   On: 11/06/2023 02:41     Procedures Procedures    Medications Ordered in ED Medications  ondansetron (ZOFRAN-ODT) disintegrating tablet 4 mg (has no administration in time range)  morphine (PF) 4 MG/ML injection 4 mg (4 mg Intravenous Given 11/06/23 0113)  ondansetron (ZOFRAN) injection 4 mg (4 mg Intravenous Given 11/06/23 0112)  iohexol (OMNIPAQUE) 350 MG/ML injection 75 mL (75 mLs Intravenous Contrast Given 11/06/23 0151)    ED Course/ Medical Decision Making/ A&P                                 Medical Decision Making Amount and/or Complexity of Data Reviewed Labs: ordered.  Risk Prescription drug management.   BP (!) 167/97   Pulse 75   Temp 98 F (36.7 C) (Oral)   Resp 15   Ht 5\' 6"  (1.676 m)   Wt 124.7 kg   SpO2 100%   BMI 44.39 kg/m   46:64 AM  42 year old male with  prior history of diverticulitis with complication status post colostomy since 2021 who presents with complaint of abdominal pain.  Patient report for the past 3 days he has had persistent nausea, has vomited bilious contents, and noticed no output to his ostomy bag.  He felt his pain is more of a crampy and sharp sensation primarily on the left upper abdomen.  Pain is moderate to severe.  Endorsed chills without fever no chest pain or shortness of breath no productive cough and no urinary symptoms.  Has history of incarcerated hernia in the past.  He worries that he may have had either a GI bug versus "clogged up".  He still has an intact gallbladder.  Exam remarkable for decreased bowel sounds and tenderness to abdomen more significant to left lower quadrant.  Ostomy bag is empty.  Workup initiated will obtain abdominal pelvis CT scan for further assessment.  Patient given Zofran and morphine for symptom control.  -Labs ordered, independently viewed and interpreted by me.  Labs remarkable for reassuring labs -The patient was maintained on a cardiac monitor.  I personally viewed and interpreted the cardiac monitored which  showed an underlying rhythm of: NSR -Imaging independently viewed and interpreted by me and I agree with radiologist's interpretation.  Result remarkable for abd/pelvis CT showing SBO -This patient presents to the ED for concern of abd pain, this involves an extensive number of treatment options, and is a complaint that carries with it a high risk of complications and morbidity.  The differential diagnosis includes SBO, diverticulitis, pancreatitis, colitis, cholecystitis, GERD, gastritis, bowel ischemia -Co morbidities that complicate the patient evaluation includes abdominal surgery, diverticular disease, incarcerated hernia.  -Treatment includes morphine, zofran -Reevaluation of the patient after these medicines showed that the patient improved -PCP office notes or outside notes reviewed -Discussion with specialist General Surgeon Dr. Hillery Hunter who recommend placing NG tube and to have medicine to admit.   He will see pt in the ER. I have also consulted [facility] Dr. Janalyn Shy who will admit pt.  I have order NG tube placement.  -Escalation to admission/observation considered: patient is agreeable with hospital admission.           Final Clinical Impression(s) / ED Diagnoses Final diagnoses:  SBO (small bowel obstruction) [facility])    Rx / DC Orders ED Discharge Orders     None         Fayrene Helper, PA-C 11/06/23 0310    Glynn Octave, MD 11/06/23 [phone]

## 2023-11-06 NOTE — Progress Notes (Signed)
PROGRESS NOTE    Jordan [name]  OZH:[id number] DOB: 03/03/1981 DOA: 11/05/2023 PCP: Sandre Kitty, MD    Brief Narrative:   Jordan [name] is a 42 y.o. male with past medical history significant for HTN, reactive airway disease, diverticulitis complicated by incarcerated hernia s/p colectomy with colostomy 2021 who presented to [facility] ED on 11/10 with progressive abdominal pain, nausea/vomiting since Thursday.  No reported bowel movement since Thursday.  Location mainly on left side of upper abdomen.  Also endorsed chills without fever.  Denies chest pain, no shortness of breath, no palpitations, no cough/headache.  In the ED, temperature 97.6 F, HR 70, RR 16, BP 157 105, SpO2 97% room air.  WBC 10.3, hemoglobin 14.1, platelet count 273.  Sodium 136, potassium 4.2, chloride 100, CO2 26, glucose 116, BUN less than 5, creatinine 1.09.  Lipase 23, AST 19, ALT 17, total bilirubin 1.0.  Urinalysis unrevealing.  CT abdomen/pelvis with small bowel obstruction with transition point at the entry into the left parastomal hernia, small bowel within the hernia sac is also dilated, question focal enteritis with stricture in the low anterior abdomen versus peristalsis, small volume free fluid in the hernia sac and pelvis.  EDP consulted general surgery; recommended placement of NG tube.  TRH consulted for admission for further evaluation management of small bowel obstruction.  Assessment & Plan:   Small bowel obstruction History of diverticulitis/incarcerated hernia s/p colectomy with colostomy The patient presenting to ED with progressive abdominal pain associate with nausea/vomiting.  No stool output since Thursday.  Patient is afebrile without leukocytosis.  CT abdomen/pelvis notable for small bowel obstruction with transition point at the entry into the left parastomal hernia, small bowel within the hernia sac is also dilated. -- General Surgery following, appreciate  assistance -- Continue NGT to LWIS -- NPO -- Dilaudid 1-2 mg IV q3h PRN moderate/severe pain -- Zofran as needed nausea -- LR at 100 mL/h -- continue to monitor NGT output, since admission -- Wound/ostomy consult for management of ostomy -- further per general surgery  Essential Hypertension -- Holding home oral antihypertensives in the setting of n.p.o., small bowel obstruction as above -- Hydralazine 10mg  IV q8h PRN CBP >160 or DBP >110  Class II obesity Body mass index is 44.39 kg/m.  Complicates all facets of care    DVT prophylaxis: SCDs Start: 11/06/23 0325 Place TED hose Start: 11/06/23 0325    Code Status: Full Code Family Communication: No family present at bedside this morning  Disposition Plan:  Level of care: Telemetry Medical Status is: Inpatient Remains inpatient appropriate because: N.p.o., NG tube to low wall suction, IV fluids    Consultants:  General surgery  Procedures:  None  Antimicrobials:  None   Subjective: Patient seen examined bedside, resting calmly.  Lying in bed.  Continues to complain of exquisite abdominal discomfort, IV fentanyl not helping.  Continues with NG tube in place to low wall suction.  Discussed changing fentanyl to IV Dilaudid.  No other specific questions or concerns at this time.  Denies headache, no visual changes, no chest pain, no palpitations, no current nausea/vomiting, no diarrhea, no fever/chills/night sweats, no cough/congestion, no focal weakness, no paresthesia.  No acute events overnight per nurse staff.  Objective: Vitals:   11/06/23 0700 11/06/23 0812 11/06/23 0820 11/06/23 1012  BP: (!) 168/95   (!) 165/88  Pulse: (!) 53 (!) 56  65  Resp: 13 17  15   Temp:   97.6 F (36.4  C)   TempSrc:   Oral   SpO2: 95% 100%  98%  Weight:      Height:        Intake/Output Summary (Last 24 hours) at 11/06/2023 1048 Last data filed at 11/06/2023 0442 Gross per 24 hour  Intake --  Output 300 ml  Net -300 ml    Filed Weights   11/05/23 2312  Weight: 124.7 kg    Examination:  Physical Exam: GEN: NAD, alert and oriented x 3, obese HEENT: NCAT, PERRL, EOMI, sclera clear, MMM PULM: CTAB w/o wheezes/crackles, normal respiratory effort CV: RRR w/o M/G/R GI: abd soft, + distention and TTP, no stool or air in ostomy collection bag, no R/G/M MSK: no peripheral edema, moves all extremities independently NEURO: CN II-XII intact, no focal deficits, sensation to light touch intact PSYCH: normal mood/affect Integumentary: dry/intact, no rashes or wounds    Data Reviewed: I have personally reviewed following labs and imaging studies  CBC: Recent Labs  Lab 11/05/23 2317 11/06/23 0424  WBC 10.3 10.8*  HGB 14.1 13.3  HCT 45.5 42.3  MCV 87.2 85.8  PLT 273 257   Basic Metabolic Panel: Recent Labs  Lab 11/05/23 2317 11/06/23 0424  NA 136 135  K 4.2 3.8  CL 100 100  CO2 26 26  GLUCOSE 116* 114*  BUN <5* <5*  CREATININE 1.09 0.94  CALCIUM 9.4 8.8*   GFR: Estimated Creatinine Clearance: 126.4 mL/min (by C-G formula based on SCr of 0.94 mg/dL). Liver Function Tests: Recent Labs  Lab 11/05/23 2317 11/06/23 0424  AST 19 16  ALT 17 16  ALKPHOS 61 56  BILITOT 1.0 0.9  PROT 6.9 6.5  ALBUMIN 3.8 3.5   Recent Labs  Lab 11/05/23 2317  LIPASE 23   No results for input(s): "AMMONIA" in the last 168 hours. Coagulation Profile: No results for input(s): "INR", "PROTIME" in the last 168 hours. Cardiac Enzymes: No results for input(s): "CKTOTAL", "CKMB", "CKMBINDEX", "TROPONINI" in the last 168 hours. BNP (last 3 results) No results for input(s): "PROBNP" in the last 8760 hours. HbA1C: No results for input(s): "HGBA1C" in the last 72 hours. CBG: Recent Labs  Lab 11/06/23 0615 11/06/23 0828  GLUCAP 99 132*   Lipid Profile: No results for input(s): "CHOL", "HDL", "LDLCALC", "TRIG", "CHOLHDL", "LDLDIRECT" in the last 72 hours. Thyroid Function Tests: No results for input(s):  "TSH", "T4TOTAL", "FREET4", "T3FREE", "THYROIDAB" in the last 72 hours. Anemia Panel: No results for input(s): "VITAMINB12", "FOLATE", "FERRITIN", "TIBC", "IRON", "RETICCTPCT" in the last 72 hours. Sepsis Labs: No results for input(s): "PROCALCITON", "LATICACIDVEN" in the last 168 hours.  No results found for this or any previous visit (from the past 240 hour(s)).       Radiology Studies: DG Abd Portable 1V-Small Bowel Protocol-Position Verification  Result Date: 11/06/2023 CLINICAL DATA:  NG tube placement. EXAM: PORTABLE ABDOMEN - 1 VIEW COMPARISON:  10/05/2020.  CT scan from earlier same day FINDINGS: NG tube tip is positioned in the region of the pylorus. Tube could be pulled back 8-9 cm to lessen the possibility of transpyloric tip placement, as clinically warranted. Gaseous distention of small bowel loops again noted in the upper abdomen measuring up to 3.7 cm diameter. IMPRESSION: NG tube tip is positioned in the region of the pylorus. Tube could be pulled back 8-9 cm to lessen the possibility of transpyloric tip placement, as clinically warranted. Electronically Signed   By: Kennith Center M.D.   On: 11/06/2023 05:22   CT ABDOMEN PELVIS  W CONTRAST  Result Date: 11/06/2023 CLINICAL DATA:  Abdominal pain, emesis. No ostomy output. History of incarcerated hernia. EXAM: CT ABDOMEN AND PELVIS WITH CONTRAST TECHNIQUE: Multidetector CT imaging of the abdomen and pelvis was performed using the standard protocol following bolus administration of intravenous contrast. RADIATION DOSE REDUCTION: This exam was performed according to the departmental dose-optimization program which includes automated exposure control, adjustment of the mA and/or kV according to patient size and/or use of iterative reconstruction technique. CONTRAST:  75mL OMNIPAQUE IOHEXOL 350 MG/ML SOLN COMPARISON:  CT abdomen and pelvis 12/01/2020 FINDINGS: Lower chest: No acute abnormality. Hepatobiliary: Unremarkable. Pancreas:  Mild fatty atrophy without acute abnormality. Spleen: Unremarkable. Adrenals/Urinary Tract: Stable adrenal glands. No urinary calculi or hydronephrosis. Unremarkable bladder. Stomach/Bowel: Stomach is within normal limits. Hartmann's pouch. Colonic diverticulosis without diverticulitis. Normal appendix. Small bowel dilation with air-fluid levels and transition point at the entry into the left parastomal hernia (circa series 3/image 50). The small bowel within the hernia sac is also dilated. Questionable area of focal small bowel wall thickening versus peristalsis in the low abdomen (series 6/image 97 and series 3/image 72). Vascular/Lymphatic: No significant vascular findings are present. No enlarged abdominal or pelvic lymph nodes. Reproductive: Unremarkable. Other: Small volume free fluid in the hernia sac and pelvis. No free intraperitoneal air. Musculoskeletal: No acute fracture. IMPRESSION: 1. Small bowel obstruction with transition point at the entry into the left parastomal hernia. The small bowel within the hernia sac is also dilated. Surgical consult recommended. 2. Question focal enteritis with stricture in the low anterior abdomen versus peristalsis. 3. Small volume free fluid in the hernia sac and pelvis. Electronically Signed   By: Minerva Fester M.D.   On: 11/06/2023 02:41        Scheduled Meds:   HYDROmorphone (DILAUDID) injection  1 mg Intravenous NOW   sodium chloride flush  3 mL Intravenous Q12H   sodium chloride flush  3 mL Intravenous Q12H   Continuous Infusions:  sodium chloride     lactated ringers 100 mL/hr at 11/06/23 0645     LOS: 0 days    Time spent: 58 minutes spent on chart review, discussion with nursing staff, consultants, updating family and interview/physical exam; more than 50% of that time was spent in counseling and/or coordination of care.    Alvira Philips [place], DO [facility] Available via Epic secure chat 7am-7pm After these hours, please refer to  coverage provider listed on [email or web address] 11/06/2023, 10:48 AM

## 2023-11-06 NOTE — ED Notes (Signed)
Pt's O2 dropped into the mid eighties so the nurse and I decided to place him on 2 liters of oxygen.

## 2023-11-06 NOTE — ED Notes (Addendum)
NG tube pulled back 7cm per radiology suggestion; continues to produce gastric content

## 2023-11-06 NOTE — Plan of Care (Signed)

## 2023-11-06 NOTE — ED Notes (Signed)
ED TO INPATIENT HANDOFF REPORT  ED Nurse Name and Phone #: Cammy Copa  S Name/Age/Gender Jordan [name] 42 y.o. male Room/Bed: 040C/040C  Code Status   Code Status: Full Code  Home/SNF/Other Home Patient oriented to: self, place, time, and situation Is this baseline? Yes   Triage Complete: Triage complete  Chief Complaint Small bowel obstruction (HCC) [K56.609] SBO (small bowel obstruction) (HCC) [K56.609]  Triage Note Pt has hernia and lifted a heavy box on Thursday since then pain to hernia has increased. Has not been able to keep anything down. Emesis x4 a day. LBM was Thursday before injury. Pain and hernia is to LUQ. Endorses NV since thurday   Allergies Allergies  Allergen Reactions   Butrans [Buprenorphine Hcl]     Difficulty breathing, n/v   Nucynta [Tapentadol Hydrochloride] Anaphylaxis   Glucose Polymer Other (See Comments)    poison   [place]     poison   Bactrim [Sulfamethoxazole-Trimethoprim] Itching and Rash    Level of Care/Admitting Diagnosis ED Disposition     ED Disposition  Admit   Condition  --   Comment  Hospital Area: [facility] [[id number]]  Level of Care: Telemetry Medical [104]  May admit patient to [facility] or [facility] if equivalent level of care is available:: No  Covid Evaluation: Asymptomatic - no recent exposure (last 10 days) testing not required  Diagnosis: SBO (small bowel obstruction) [facility]) [[id number]]  Admitting Physician: Tereasa Coop [[id number]]  Attending Physician: Tereasa Coop [[id number]]  Certification:: I certify this patient will need inpatient services for at least 2 midnights          B Medical/Surgery History Past Medical History:  Diagnosis Date   Anxiety and depression    Asthma    Chronic pain syndrome    GERD (gastroesophageal reflux disease)    History of kidney stones    HYPERTENSION, MILD    OSTEOARTHRITIS    Recurrent UTI    Sleep apnea    Past Surgical  History:  Procedure Laterality Date   COLON SURGERY     COLONOSCOPY WITH PROPOFOL N/A 09/30/2020   Procedure: COLONOSCOPY WITH PROPOFOL;  Surgeon: Midge Minium, MD;  Location: [facility] ENDOSCOPY;  Service: Endoscopy;  Laterality: N/A;   JOINT REPLACEMENT     KNEE SURGERY Right    7 total surgeries   LYSIS OF ADHESION  10/15/2020   Procedure: LYSIS OF ADHESION;  Surgeon: [name], MD;  Location: [facility] ORS;  Service: General;;   REPLACEMENT TOTAL KNEE Right    XI ROBOT ASSISTED DIAGNOSTIC LAPAROSCOPY N/A 10/15/2020   Procedure: XI ROBOT ASSISTED DIAGNOSTIC LAPAROSCOPY, HARTMAN'S;  Surgeon: [name], MD;  Location: [facility] ORS;  Service: General;  Laterality: N/A;     A IV Location/Drains/Wounds Patient Lines/Drains/Airways Status     Active Line/Drains/Airways     Name Placement date Placement time Site Days   Peripheral IV 11/06/23 20 G Left Antecubital 11/06/23  0112  Antecubital  less than 1   Closed System Drain 1 Right RLQ Bulb (JP) 19 Fr. 10/15/20  2201  RLQ  1117   NG/OG Vented/Dual Lumen Right nare Marking at nare/corner of mouth 80 cm 11/06/23  0441  Right nare  less than 1   Colostomy LLQ 10/15/20  2149  LLQ  1117   Incision (Closed) 10/07/20 Abdomen Left;Anterior 10/07/20  1936  -- 1125   Incision (Closed) 10/15/20 Abdomen Other (Comment) 10/15/20  2154  -- 1117   Incision - 4 Ports  Abdomen Lateral;Left;Upper Mid;Upper Right;Upper;Lateral Right;Lower;Lateral 10/01/20  0930  -- 1131   Incision - 3 Ports Abdomen Upper;Left Mid Upper;Right 10/15/20  1816  -- 1117            Intake/Output Last 24 hours  Intake/Output Summary (Last 24 hours) at 11/06/2023 1521 Last data filed at 11/06/2023 1237 Gross per 24 hour  Intake 491.87 ml  Output 600 ml  Net -108.13 ml    Labs/Imaging Results for orders placed or performed during the hospital encounter of 11/05/23 (from the past 48 hour(s))  Urinalysis, Routine w reflex microscopic -Urine, Clean Catch     Status:  Abnormal   Collection Time: 11/05/23 11:14 PM  Result Value Ref Range   Color, Urine YELLOW YELLOW   APPearance HAZY (A) CLEAR   Specific Gravity, Urine 1.019 1.005 - 1.030   pH 6.0 5.0 - 8.0   Glucose, UA NEGATIVE NEGATIVE mg/dL   Hgb urine dipstick NEGATIVE NEGATIVE   Bilirubin Urine NEGATIVE NEGATIVE   Ketones, ur 20 (A) NEGATIVE mg/dL   Protein, ur 161 (A) NEGATIVE mg/dL   Nitrite NEGATIVE NEGATIVE   Leukocytes,Ua NEGATIVE NEGATIVE   RBC / HPF 0-5 0 - 5 RBC/hpf   WBC, UA 0-5 0 - 5 WBC/hpf   Bacteria, UA FEW (A) NONE SEEN   Squamous Epithelial / HPF 0-5 0 - 5 /HPF   Mucus PRESENT    Budding Yeast PRESENT    Hyaline Casts, UA PRESENT    Granular Casts, UA PRESENT     Comment: Performed at [facility], [address] [place] [address], [place], [place] [address]  Lipase, blood     Status: None   Collection Time: 11/05/23 11:17 PM  Result Value Ref Range   Lipase 23 11 - 51 U/L    Comment: Performed at [facility], [address], [place], [place] [address]  Comprehensive metabolic panel     Status: Abnormal   Collection Time: 11/05/23 11:17 PM  Result Value Ref Range   Sodium 136 135 - 145 mmol/L   Potassium 4.2 3.5 - 5.1 mmol/L   Chloride 100 98 - 111 mmol/L   CO2 26 22 - 32 mmol/L   Glucose, Bld 116 (H) 70 - 99 mg/dL    Comment: Glucose reference range applies only to samples taken after fasting for at least 8 hours.   BUN <5 (L) 6 - 20 mg/dL   Creatinine, Ser 1.19 0.61 - 1.24 mg/dL   Calcium 9.4 8.9 - 14.7 mg/dL   Total Protein 6.9 6.5 - 8.1 g/dL   Albumin 3.8 3.5 - 5.0 g/dL   AST 19 15 - 41 U/L   ALT 17 0 - 44 U/L   Alkaline Phosphatase 61 38 - 126 U/L   Total Bilirubin 1.0 <1.2 mg/dL   GFR, Estimated >82 >95 mL/min    Comment: (NOTE) Calculated using the CKD-EPI Creatinine Equation (2021)    Anion gap 10 5 - 15    Comment: Performed at [facility], [address] [place], [place] [address]  CBC     Status: None   Collection Time: 11/05/23 11:17 PM   Result Value Ref Range   WBC 10.3 4.0 - 10.5 K/uL   RBC 5.22 4.22 - 5.81 MIL/uL   Hemoglobin 14.1 13.0 - 17.0 g/dL   HCT 86.5 78.4 - 69.6 %   MCV 87.2 80.0 - 100.0 fL   MCH 27.0 26.0 - 34.0 pg   MCHC 31.0 30.0 -  36.0 g/dL   RDW 40.9 81.1 - 91.4 %   Platelets 273 150 - 400 K/uL   nRBC 0.0 0.0 - 0.2 %    Comment: Performed at [facility], [address], [place], [place] [address]  CBC     Status: Abnormal   Collection Time: 11/06/23  4:24 AM  Result Value Ref Range   WBC 10.8 (H) 4.0 - 10.5 K/uL   RBC 4.93 4.22 - 5.81 MIL/uL   Hemoglobin 13.3 13.0 - 17.0 g/dL   HCT 62.1 30.8 - 65.7 %   MCV 85.8 80.0 - 100.0 fL   MCH 27.0 26.0 - 34.0 pg   MCHC 31.4 30.0 - 36.0 g/dL   RDW 84.6 96.2 - 95.2 %   Platelets 257 150 - 400 K/uL   nRBC 0.0 0.0 - 0.2 %    Comment: Performed at [facility], [address], [place], [place] [address]  Comprehensive metabolic panel     Status: Abnormal   Collection Time: 11/06/23  4:24 AM  Result Value Ref Range   Sodium 135 135 - 145 mmol/L   Potassium 3.8 3.5 - 5.1 mmol/L   Chloride 100 98 - 111 mmol/L   CO2 26 22 - 32 mmol/L   Glucose, Bld 114 (H) 70 - 99 mg/dL    Comment: Glucose reference range applies only to samples taken after fasting for at least 8 hours.   BUN <5 (L) 6 - 20 mg/dL   Creatinine, Ser 4.40 0.61 - 1.24 mg/dL   Calcium 8.8 (L) 8.9 - 10.3 mg/dL   Total Protein 6.5 6.5 - 8.1 g/dL   Albumin 3.5 3.5 - 5.0 g/dL   AST 16 15 - 41 U/L   ALT 16 0 - 44 U/L   Alkaline Phosphatase 56 38 - 126 U/L   Total Bilirubin 0.9 <1.2 mg/dL   GFR, Estimated >10 >27 mL/min    Comment: (NOTE) Calculated using the CKD-EPI Creatinine Equation (2021)    Anion gap 9 5 - 15    Comment: Performed at [facility], [address], [place], [place] [address]  HIV Antibody (routine testing w rflx)     Status: None   Collection Time: 11/06/23  4:24 AM  Result Value Ref Range   HIV Screen 4th Generation wRfx Non Reactive Non Reactive     Comment: Performed at [facility], [address] [place], [place] [address]  CBG monitoring, ED     Status: None   Collection Time: 11/06/23  6:15 AM  Result Value Ref Range   Glucose-Capillary 99 70 - 99 mg/dL    Comment: Glucose reference range applies only to samples taken after fasting for at least 8 hours.  CBG monitoring, ED     Status: Abnormal   Collection Time: 11/06/23  8:28 AM  Result Value Ref Range   Glucose-Capillary 132 (H) 70 - 99 mg/dL    Comment: Glucose reference range applies only to samples taken after fasting for at least 8 hours.   DG Abd Portable 1V-Small Bowel Obstruction Protocol-initial, 8 hr delay  Result Date: 11/06/2023 CLINICAL DATA:  8 hour delayed image for small bowel obstruction. EXAM: PORTABLE ABDOMEN - 1 VIEW COMPARISON:  Earlier today. FINDINGS: There is a nasogastric tube within the stomach with tip in the expected location of the gastric antrum. Residual in tear contrast material noted within the gastric fundus. Dilated scratch set persistent small bowel dilatation with right upper quadrant  small bowel loops measuring up to 5 cm. Dilute enteric contrast material is noted within right lower quadrant and central abdomen dilated small bowel. IMPRESSION: 1. Persistent small bowel dilatation compatible with small bowel obstruction. There is some dilute enteric contrast material within dilated small bowel loops in the central abdomen and right lower quadrant. No colonic enteric contrast material identified. 2. Nasogastric tube within the stomach. Electronically Signed   By: Signa Kell M.D.   On: 11/06/2023 15:08   DG Abd Portable 1V-Small Bowel Protocol-Position Verification  Result Date: 11/06/2023 CLINICAL DATA:  NG tube placement. EXAM: PORTABLE ABDOMEN - 1 VIEW COMPARISON:  10/05/2020.  CT scan from earlier same day FINDINGS: NG tube tip is positioned in the region of the pylorus. Tube could be pulled back 8-9 cm to lessen the possibility of  transpyloric tip placement, as clinically warranted. Gaseous distention of small bowel loops again noted in the upper abdomen measuring up to 3.7 cm diameter. IMPRESSION: NG tube tip is positioned in the region of the pylorus. Tube could be pulled back 8-9 cm to lessen the possibility of transpyloric tip placement, as clinically warranted. Electronically Signed   By: Kennith Center M.D.   On: 11/06/2023 05:22   CT ABDOMEN PELVIS W CONTRAST  Result Date: 11/06/2023 CLINICAL DATA:  Abdominal pain, emesis. No ostomy output. History of incarcerated hernia. EXAM: CT ABDOMEN AND PELVIS WITH CONTRAST TECHNIQUE: Multidetector CT imaging of the abdomen and pelvis was performed using the standard protocol following bolus administration of intravenous contrast. RADIATION DOSE REDUCTION: This exam was performed according to the departmental dose-optimization program which includes automated exposure control, adjustment of the mA and/or kV according to patient size and/or use of iterative reconstruction technique. CONTRAST:  75mL OMNIPAQUE IOHEXOL 350 MG/ML SOLN COMPARISON:  CT abdomen and pelvis 12/01/2020 FINDINGS: Lower chest: No acute abnormality. Hepatobiliary: Unremarkable. Pancreas: Mild fatty atrophy without acute abnormality. Spleen: Unremarkable. Adrenals/Urinary Tract: Stable adrenal glands. No urinary calculi or hydronephrosis. Unremarkable bladder. Stomach/Bowel: Stomach is within normal limits. Hartmann's pouch. Colonic diverticulosis without diverticulitis. Normal appendix. Small bowel dilation with air-fluid levels and transition point at the entry into the left parastomal hernia (circa series 3/image 50). The small bowel within the hernia sac is also dilated. Questionable area of focal small bowel wall thickening versus peristalsis in the low abdomen (series 6/image 97 and series 3/image 72). Vascular/Lymphatic: No significant vascular findings are present. No enlarged abdominal or pelvic lymph nodes.  Reproductive: Unremarkable. Other: Small volume free fluid in the hernia sac and pelvis. No free intraperitoneal air. Musculoskeletal: No acute fracture. IMPRESSION: 1. Small bowel obstruction with transition point at the entry into the left parastomal hernia. The small bowel within the hernia sac is also dilated. Surgical consult recommended. 2. Question focal enteritis with stricture in the low anterior abdomen versus peristalsis. 3. Small volume free fluid in the hernia sac and pelvis. Electronically Signed   By: Minerva Fester M.D.   On: 11/06/2023 02:41    Pending Labs Unresulted Labs (From admission, onward)     Start     Ordered   11/07/23 0500  Magnesium  Tomorrow morning,   R        11/06/23 1105   11/07/23 0500  Phosphorus  Tomorrow morning,   R        11/06/23 1105   11/07/23 0500  Lactic acid, plasma  (Lactic Acid)  Tomorrow morning,   R        11/06/23 1110   11/06/23 0500  CBC  Daily,   R      11/06/23 0331   11/06/23 0500  Comprehensive metabolic panel  Daily,   R      11/06/23 0331            Vitals/Pain Today's Vitals   11/06/23 1157 11/06/23 1215 11/06/23 1236 11/06/23 1500  BP:  135/75  138/80  Pulse:  (!) 50  (!) 50  Resp:  15  14  Temp:   97.8 F (36.6 C)   TempSrc:   Oral   SpO2:  99%  99%  Weight:      Height:      PainSc: 7        Isolation Precautions No active isolations  Medications Medications  albuterol (PROVENTIL) (2.5 MG/3ML) 0.083% nebulizer solution 3 mL (has no administration in time range)  sodium chloride flush (NS) 0.9 % injection 3 mL (3 mLs Intravenous Given 11/06/23 0955)  sodium chloride flush (NS) 0.9 % injection 3 mL (3 mLs Intravenous Given 11/06/23 0955)  sodium chloride flush (NS) 0.9 % injection 3 mL (has no administration in time range)  0.9 %  sodium chloride infusion (has no administration in time range)  acetaminophen (TYLENOL) tablet 650 mg (has no administration in time range)    Or  acetaminophen (TYLENOL)  suppository 650 mg (has no administration in time range)  hydrALAZINE (APRESOLINE) injection 10 mg (has no administration in time range)  ondansetron (ZOFRAN) injection 4 mg (4 mg Intravenous Given 11/06/23 0646)  HYDROmorphone (DILAUDID) injection 1-2 mg (1 mg Intravenous Given 11/06/23 1201)  HYDROmorphone (DILAUDID) injection 1 mg (1 mg Intravenous Not Given 11/06/23 0805)  lactated ringers infusion ( Intravenous New Bag/Given 11/06/23 1202)  morphine (PF) 4 MG/ML injection 4 mg (4 mg Intravenous Given 11/06/23 0113)  ondansetron (ZOFRAN) injection 4 mg (4 mg Intravenous Given 11/06/23 0112)  iohexol (OMNIPAQUE) 350 MG/ML injection 75 mL (75 mLs Intravenous Contrast Given 11/06/23 0151)  diatrizoate meglumine-sodium (GASTROGRAFIN) 66-10 % solution 90 mL (90 mLs Per NG tube Given 11/06/23 0541)    Mobility walks     Focused Assessments Abd pain-hx of SBO and hernia   R Recommendations: See Admitting Provider Note  Report given to:   Additional Notes: NG tube. output. Ostomy with no output.

## 2023-11-06 NOTE — Consult Note (Signed)
Jordan [name] February 13, 1981  [id number].    Requesting MD: Rancour Chief Complaint/Reason for Consult: Parastomal hernia w/ SBO  HPI:  42 y/o M w/ a hx of HTN and diverticulitis c/b colovesical fistula s/p fistula takedown with anastomosis c/b dehiscence requiring return to the OR for end colostomy creation (Oct 2021 at OSH).  He has known stricturing of his ostomy and performs routine dilations at home in order to facilitate stool output.  He reports that he has had issues with decreased ostomy output in the past but his symptoms usually resolve with an increase in his bowel regimen and a short period of bowel rest.  This time he developed pain 2 days ago along with nausea and frequent emesis.  When the symptoms did not improve he decided to present for evaluation. CT shows a parastomal hernia with concern for an SBO at the level of the fascia.  There is some edema within the hernia sac and pelvis.  On exam, patient resting comfortably. NAD.  WBC 10  ROS: Review of Systems  Constitutional: Negative.   HENT: Negative.    Eyes: Negative.   Respiratory: Negative.    Cardiovascular: Negative.   Gastrointestinal:  Positive for abdominal pain, constipation, nausea and vomiting.  Genitourinary: Negative.   Musculoskeletal: Negative.   Skin: Negative.   Neurological: Negative.   Endo/Heme/Allergies: Negative.   Psychiatric/Behavioral: Negative.      Family History  Problem Relation Age of Onset   Heart disease Father    Heart attack Father    Diabetes Father    High Cholesterol Father    High blood pressure Father    Heart disease Paternal Grandfather    High Cholesterol Paternal Grandfather    Heart disease Paternal Grandmother    Heart attack Paternal Grandmother    Diabetes Paternal Grandmother    Heart disease Paternal Uncle    Cancer Paternal Uncle    Heart attack Paternal Uncle    Diabetes Paternal Uncle    High Cholesterol Paternal Uncle    Diabetes  Paternal Aunt    Hypertension Maternal Uncle    Cancer Maternal Aunt        lung   Cancer Maternal Uncle        lung   Depression Mother    Dementia Mother     Past Medical History:  Diagnosis Date   Anxiety and depression    Asthma    Chronic pain syndrome    GERD (gastroesophageal reflux disease)    History of kidney stones    HYPERTENSION, MILD    OSTEOARTHRITIS    Recurrent UTI    Sleep apnea     Past Surgical History:  Procedure Laterality Date   COLON SURGERY     COLONOSCOPY WITH PROPOFOL N/A 09/30/2020   Procedure: COLONOSCOPY WITH PROPOFOL;  Surgeon: Midge Minium, MD;  Location: [facility];  Service: Endoscopy;  Laterality: N/A;   JOINT REPLACEMENT     KNEE SURGERY Right    7 total surgeries   LYSIS OF ADHESION  10/15/2020   Procedure: LYSIS OF ADHESION;  Surgeon: [name], MD;  Location: [facility] OR[facility];  Service: General;;   REPLACEMENT TOTAL KNEE Right    XI ROBOT ASSISTED DIAGNOSTIC LAPAROSCOPY N/A 10/15/2020   Procedure: XI ROBOT ASSISTED DIAGNOSTIC LAPAROSCOPY, HARTMAN'S;  Surgeon: [name], MD;  Location: [facility] OR[facility];  Service: General;  Laterality: N/A;    Social History:  reports that he quit smoking about 9 years ago. His smoking  use included cigars. He has never been exposed to tobacco smoke. He has never used smokeless tobacco. He reports current alcohol use of about 13.0 standard drinks of alcohol per week. He reports current drug use. Frequency: 7.00 times per week. Drug: Marijuana.  Allergies:  Allergies  Allergen Reactions   Butrans [Buprenorphine Hcl]     Difficulty breathing, n/v   Nucynta [Tapentadol Hydrochloride] Anaphylaxis   Tapentadol Hcl Anaphylaxis   Glucose Polymer Other (See Comments)    poison  poison, poison   [place]     poison   Bactrim [Sulfamethoxazole-Trimethoprim] Itching and Rash    (Not in a hospital admission)   Physical Exam: Blood pressure (!) 167/97, pulse 75, temperature 98 F (36.7 C), temperature  source Oral, resp. rate 15, height 5\' 6"  (1.676 m), weight 124.7 kg, SpO2 100%. Gen: male resting in bed, NAD Abd: soft, non-distended, ostomy appliance in place without stool or gas in the bag, bowel palpable adjacent to his stoma is not easily reducible but is freely mobile and soft, moderately TTP, no skin changes, no rebound/guarding, no peritoneal signs   Results for orders placed or performed during the hospital encounter of 11/05/23 (from the past 48 hour(s))  Urinalysis, Routine w reflex microscopic -Urine, Clean Catch     Status: Abnormal   Collection Time: 11/05/23 11:14 PM  Result Value Ref Range   Color, Urine YELLOW YELLOW   APPearance HAZY (A) CLEAR   Specific Gravity, Urine 1.019 1.005 - 1.030   pH 6.0 5.0 - 8.0   Glucose, UA NEGATIVE NEGATIVE mg/dL   Hgb urine dipstick NEGATIVE NEGATIVE   Bilirubin Urine NEGATIVE NEGATIVE   Ketones, ur 20 (A) NEGATIVE mg/dL   Protein, ur 161 (A) NEGATIVE mg/dL   Nitrite NEGATIVE NEGATIVE   Leukocytes,Ua NEGATIVE NEGATIVE   RBC / HPF 0-5 0 - 5 RBC/hpf   WBC, UA 0-5 0 - 5 WBC/hpf   Bacteria, UA FEW (A) NONE SEEN   Squamous Epithelial / HPF 0-5 0 - 5 /HPF   Mucus PRESENT    Budding Yeast PRESENT    Hyaline Casts, UA PRESENT    Granular Casts, UA PRESENT     Comment: Performed at [facility], [address], [place], [place] [address]  Lipase, blood     Status: None   Collection Time: 11/05/23 11:17 PM  Result Value Ref Range   Lipase 23 11 - 51 U/L    Comment: Performed at [facility], [address], [place], [place] [address]  Comprehensive metabolic panel     Status: Abnormal   Collection Time: 11/05/23 11:17 PM  Result Value Ref Range   Sodium 136 135 - 145 mmol/L   Potassium 4.2 3.5 - 5.1 mmol/L   Chloride 100 98 - 111 mmol/L   CO2 26 22 - 32 mmol/L   Glucose, Bld 116 (H) 70 - 99 mg/dL    Comment: Glucose reference range applies only to samples taken after fasting for at least 8 hours.   BUN <5 (L) 6 - 20  mg/dL   Creatinine, Ser 1.19 0.61 - 1.24 mg/dL   Calcium 9.4 8.9 - 14.7 mg/dL   Total Protein 6.9 6.5 - 8.1 g/dL   Albumin 3.8 3.5 - 5.0 g/dL   AST 19 15 - 41 U/L   ALT 17 0 - 44 U/L   Alkaline Phosphatase 61 38 - 126 U/L   Total Bilirubin 1.0 <1.2 mg/dL   GFR, Estimated >82 >95 mL/min  Comment: (NOTE) Calculated using the CKD-EPI Creatinine Equation (2021)    Anion gap 10 5 - 15    Comment: Performed at [facility], [address], [place], [place] [address]  CBC     Status: None   Collection Time: 11/05/23 11:17 PM  Result Value Ref Range   WBC 10.3 4.0 - 10.5 K/uL   RBC 5.22 4.22 - 5.81 MIL/uL   Hemoglobin 14.1 13.0 - 17.0 g/dL   HCT 96.2 95.2 - 84.1 %   MCV 87.2 80.0 - 100.0 fL   MCH 27.0 26.0 - 34.0 pg   MCHC 31.0 30.0 - 36.0 g/dL   RDW 32.4 40.1 - 02.7 %   Platelets 273 150 - 400 K/uL   nRBC 0.0 0.0 - 0.2 %    Comment: Performed at [facility], [address], [place], [place] [address]   CT ABDOMEN PELVIS W CONTRAST  Result Date: 11/06/2023 CLINICAL DATA:  Abdominal pain, emesis. No ostomy output. History of incarcerated hernia. EXAM: CT ABDOMEN AND PELVIS WITH CONTRAST TECHNIQUE: Multidetector CT imaging of the abdomen and pelvis was performed using the standard protocol following bolus administration of intravenous contrast. RADIATION DOSE REDUCTION: This exam was performed according to the departmental dose-optimization program which includes automated exposure control, adjustment of the mA and/or kV according to patient size and/or use of iterative reconstruction technique. CONTRAST:  75mL OMNIPAQUE IOHEXOL 350 MG/ML SOLN COMPARISON:  CT abdomen and pelvis 12/01/2020 FINDINGS: Lower chest: No acute abnormality. Hepatobiliary: Unremarkable. Pancreas: Mild fatty atrophy without acute abnormality. Spleen: Unremarkable. Adrenals/Urinary Tract: Stable adrenal glands. No urinary calculi or hydronephrosis. Unremarkable bladder. Stomach/Bowel: Stomach is within  normal limits. Hartmann's pouch. Colonic diverticulosis without diverticulitis. Normal appendix. Small bowel dilation with air-fluid levels and transition point at the entry into the left parastomal hernia (circa series 3/image 50). The small bowel within the hernia sac is also dilated. Questionable area of focal small bowel wall thickening versus peristalsis in the low abdomen (series 6/image 97 and series 3/image 72). Vascular/Lymphatic: No significant vascular findings are present. No enlarged abdominal or pelvic lymph nodes. Reproductive: Unremarkable. Other: Small volume free fluid in the hernia sac and pelvis. No free intraperitoneal air. Musculoskeletal: No acute fracture. IMPRESSION: 1. Small bowel obstruction with transition point at the entry into the left parastomal hernia. The small bowel within the hernia sac is also dilated. Surgical consult recommended. 2. Question focal enteritis with stricture in the low anterior abdomen versus peristalsis. 3. Small volume free fluid in the hernia sac and pelvis. Electronically Signed   By: Minerva Fester M.D.   On: 11/06/2023 02:41    Assessment/Plan 42 y/o M w/ a hx of colovesical fistula s/p takedown c/b anastomotic leak requiring end colostomy creation who presents with abdominal pain and decreased stoma output and has a CT showing pSBO.  - No indication for urgent surgical intervention at this time although I explained to him that he is at risk for needing intervention if he truly has an obstruction related to his parastomal hernia - Admit to medicine - NPO, NGT to L[facility] - SBO contrast protocol (ordered) - Surgery will continue to follow   I reviewed last 24 h vitals and pain scores, last 24 h labs and trends, and last 24 h imaging results.  T[facility] 11/06/2023, 2:55 AM Please see Amion for pager number during day hours 7:00am-4:30pm or 7:00am -11:30am on weekends

## 2023-11-06 NOTE — H&P (Signed)
History and Physical    Jordan [name] UJW:[id number] DOB: [birth year] DOA: 11/05/2023  PCP: Sandre Kitty, MD   Patient coming from: Home   Chief Complaint:  Chief Complaint  Patient presents with   Abdominal Pain    Hernia    ED TRIAGE note:Pt has hernia and lifted a heavy box on Thursday since then pain to hernia has increased. Has not been able to keep anything down. Emesis x4 a day. LBM was Thursday before injury. Pain and hernia is to LUQ. Endorses NV since thurday   HPI:  Jordan [name] is a 42 y.o. male with medical history diverticulitis with complicated incarcerated hernia status post colostomy since 2021, hypertension significant of an reactive airway disease presented to emergency department complaining of abdominal pain.  Patient reported that for last 3 days he has been persistent nausea and has vomited bilious content and noticed no output to his ostomy bag.  Patient felt his pain is more crampy and sharp sensation mostly on the left side of the upper abdomen.  Pain is moderate in severity.  Endorsed chills without any fever, denies any chest pain, shortness of breath, palpitation, cough and headache.   No other complaint at this time.  ED Course:  At presentation to ED patient's blood pressure is 157/105 otherwise hemodynamically stable. CBC unremarkable. CMP unremarkable. Lipase. UA hazy appearance, ketone positive protein 100 otherwise unremarkable  IMPRESSION: 1. Small bowel obstruction with transition point at the entry into the left parastomal hernia. The small bowel within the hernia sac is also dilated. Surgical consult recommended. 2. Question focal enteritis with stricture in the low anterior abdomen versus peristalsis. 3. Small volume free fluid in the hernia sac and pelvis.  ED physician consulted general surgery Dr. Hillery Hunter surgery recommended to place NG tube and will evaluate patient soon.  Hospitalist has been  contacted for further evaluation management of small bowel obstruction.  Review of Systems:  Review of Systems  Constitutional:  Negative for chills, fever, malaise/fatigue and weight loss.  Respiratory:  Negative for cough and shortness of breath.   Cardiovascular:  Negative for chest pain and palpitations.  Gastrointestinal:  Positive for abdominal pain, nausea and vomiting. Negative for blood in stool, constipation, diarrhea, heartburn and melena.  Musculoskeletal:  Negative for myalgias.  Endo/Heme/Allergies:  Does not bruise/bleed easily.  Psychiatric/Behavioral:  The patient is not nervous/anxious.     Past Medical History:  Diagnosis Date   Anxiety and depression    Asthma    Chronic pain syndrome    GERD (gastroesophageal reflux disease)    History of kidney stones    HYPERTENSION, MILD    OSTEOARTHRITIS    Recurrent UTI    Sleep apnea     Past Surgical History:  Procedure Laterality Date   COLON SURGERY     COLONOSCOPY WITH PROPOFOL N/A 09/30/2020   Procedure: COLONOSCOPY WITH PROPOFOL;  Surgeon: Midge Minium, MD;  Location: [facility] ENDOSCOPY;  Service: Endoscopy;  Laterality: N/A;   JOINT REPLACEMENT     KNEE SURGERY Right    7 total surgeries   LYSIS OF ADHESION  10/15/2020   Procedure: LYSIS OF ADHESION;  Surgeon: [name], MD;  Location: [facility] ORS;  Service: General;;   REPLACEMENT TOTAL KNEE Right    XI ROBOT ASSISTED DIAGNOSTIC LAPAROSCOPY N/A 10/15/2020   Procedure: XI ROBOT ASSISTED DIAGNOSTIC LAPAROSCOPY, HARTMAN'S;  Surgeon: [name], MD;  Location: [facility] ORS;  Service: General;  Laterality: N/A;     reports  that he quit smoking about 9 years ago. His smoking use included cigars. He has never been exposed to tobacco smoke. He has never used smokeless tobacco. He reports current alcohol use of about 13.0 standard drinks of alcohol per week. He reports current drug use. Frequency: 7.00 times per week. Drug: Marijuana.  Allergies  Allergen  Reactions   Butrans [Buprenorphine Hcl]     Difficulty breathing, n/v   Nucynta [Tapentadol Hydrochloride] Anaphylaxis   Tapentadol Hcl Anaphylaxis   Glucose Polymer Other (See Comments)    poison  poison, poison   [place]     poison   Bactrim [Sulfamethoxazole-Trimethoprim] Itching and Rash    Family History  Problem Relation Age of Onset   Heart disease Father    Heart attack Father    Diabetes Father    High Cholesterol Father    High blood pressure Father    Heart disease Paternal Grandfather    High Cholesterol Paternal Grandfather    Heart disease Paternal Grandmother    Heart attack Paternal Grandmother    Diabetes Paternal Grandmother    Heart disease Paternal Uncle    Cancer Paternal Uncle    Heart attack Paternal Uncle    Diabetes Paternal Uncle    High Cholesterol Paternal Uncle    Diabetes Paternal Aunt    Hypertension Maternal Uncle    Cancer Maternal Aunt        lung   Cancer Maternal Uncle        lung   Depression Mother    Dementia Mother     Prior to Admission medications   Medication Sig Start Date End Date Taking? Authorizing Provider  albuterol (VENTOLIN HFA) 108 (90 Base) MCG/ACT inhaler Inhale 2 puffs into the lungs every 6 (six) hours as needed for wheezing or shortness of breath. 04/28/23   Melida Quitter, PA  aspirin 81 MG tablet Take 81 mg by mouth daily.     [provider]  Blood Pressure Monitor DEVI Check bp daily in the am 08/04/23   Sandre Kitty, MD  cetirizine (ZYRTEC) 10 MG tablet Take 1 tablet (10 mg total) by mouth daily. 03/23/22   Mayer Masker, PA-C  valsartan (DIOVAN) 80 MG tablet Take 1 tablet (80 mg total) by mouth daily. 04/28/23   Melida Quitter, PA     Physical Exam: Vitals:   11/05/23 2248 11/05/23 2312 11/06/23 0118  BP: (!) 157/105  (!) 167/97  Pulse: 70  75  Resp: 16  15  Temp: 97.6 F (36.4 C)  98 F (36.7 C)  TempSrc:   Oral  SpO2: 97%  100%  Weight:  124.7 kg   Height:  5\' 6"  (1.676 m)      Physical Exam Constitutional:      General: He is not in acute distress.    Appearance: He is not ill-appearing.  HENT:     Mouth/Throat:     Mouth: Mucous membranes are moist.  Cardiovascular:     Rate and Rhythm: Normal rate and regular rhythm.     Heart sounds: Normal heart sounds.  Abdominal:     General: Bowel sounds are decreased. There is distension.     Palpations: Abdomen is soft.     Tenderness: There is generalized abdominal tenderness.     Hernia: No hernia is present.  Skin:    General: Skin is dry.     Capillary Refill: Capillary refill takes less than 2 seconds.  Neurological:  Mental Status: He is alert and oriented to person, place, and time.  Psychiatric:        Mood and Affect: Mood normal.      Labs on Admission: I have personally reviewed following labs and imaging studies  CBC: Recent Labs  Lab 11/05/23 2317  WBC 10.3  HGB 14.1  HCT 45.5  MCV 87.2  PLT 273   Basic Metabolic Panel: Recent Labs  Lab 11/05/23 2317  NA 136  K 4.2  CL 100  CO2 26  GLUCOSE 116*  BUN <5*  CREATININE 1.09  CALCIUM 9.4   GFR: Estimated Creatinine Clearance: 109 mL/min (by C-G formula based on SCr of 1.09 mg/dL). Liver Function Tests: Recent Labs  Lab 11/05/23 2317  AST 19  ALT 17  ALKPHOS 61  BILITOT 1.0  PROT 6.9  ALBUMIN 3.8   Recent Labs  Lab 11/05/23 2317  LIPASE 23   No results for input(s): "AMMONIA" in the last 168 hours. Coagulation Profile: No results for input(s): "INR", "PROTIME" in the last 168 hours. Cardiac Enzymes: No results for input(s): "CKTOTAL", "CKMB", "CKMBINDEX", "TROPONINI", "TROPONINIHS" in the last 168 hours. BNP (last 3 results) No results for input(s): "BNP" in the last 8760 hours. HbA1C: No results for input(s): "HGBA1C" in the last 72 hours. CBG: No results for input(s): "GLUCAP" in the last 168 hours. Lipid Profile: No results for input(s): "CHOL", "HDL", "LDLCALC", "TRIG", "CHOLHDL", "LDLDIRECT" in  the last 72 hours. Thyroid Function Tests: No results for input(s): "TSH", "T4TOTAL", "FREET4", "T3FREE", "THYROIDAB" in the last 72 hours. Anemia Panel: No results for input(s): "VITAMINB12", "FOLATE", "FERRITIN", "TIBC", "IRON", "RETICCTPCT" in the last 72 hours. Urine analysis:    Component Value Date/Time   COLORURINE YELLOW 11/05/2023 2314   APPEARANCEUR HAZY (A) 11/05/2023 2314   APPEARANCEUR Hazy (A) 09/12/2020 1058   LABSPEC 1.019 11/05/2023 2314   PHURINE 6.0 11/05/2023 2314   GLUCOSEU NEGATIVE 11/05/2023 2314   HGBUR NEGATIVE 11/05/2023 2314   BILIRUBINUR NEGATIVE 11/05/2023 2314   BILIRUBINUR Negative 08/27/2020 1332   KETONESUR 20 (A) 11/05/2023 2314   PROTEINUR 100 (A) 11/05/2023 2314   UROBILINOGEN 0.2 05/04/2020 1400   NITRITE NEGATIVE 11/05/2023 2314   LEUKOCYTESUR NEGATIVE 11/05/2023 2314    Radiological Exams on Admission: I have personally reviewed images CT ABDOMEN PELVIS W CONTRAST  Result Date: 11/06/2023 CLINICAL DATA:  Abdominal pain, emesis. No ostomy output. History of incarcerated hernia. EXAM: CT ABDOMEN AND PELVIS WITH CONTRAST TECHNIQUE: Multidetector CT imaging of the abdomen and pelvis was performed using the standard protocol following bolus administration of intravenous contrast. RADIATION DOSE REDUCTION: This exam was performed according to the departmental dose-optimization program which includes automated exposure control, adjustment of the mA and/or kV according to patient size and/or use of iterative reconstruction technique. CONTRAST:  75mL OMNIPAQUE IOHEXOL 350 MG/ML SOLN COMPARISON:  CT abdomen and pelvis 12/01/2020 FINDINGS: Lower chest: No acute abnormality. Hepatobiliary: Unremarkable. Pancreas: Mild fatty atrophy without acute abnormality. Spleen: Unremarkable. Adrenals/Urinary Tract: Stable adrenal glands. No urinary calculi or hydronephrosis. Unremarkable bladder. Stomach/Bowel: Stomach is within normal limits. Hartmann's pouch. Colonic  diverticulosis without diverticulitis. Normal appendix. Small bowel dilation with air-fluid levels and transition point at the entry into the left parastomal hernia (circa series 3/image 50). The small bowel within the hernia sac is also dilated. Questionable area of focal small bowel wall thickening versus peristalsis in the low abdomen (series 6/image 97 and series 3/image 72). Vascular/Lymphatic: No significant vascular findings are present. No enlarged abdominal or pelvic  lymph nodes. Reproductive: Unremarkable. Other: Small volume free fluid in the hernia sac and pelvis. No free intraperitoneal air. Musculoskeletal: No acute fracture. IMPRESSION: 1. Small bowel obstruction with transition point at the entry into the left parastomal hernia. The small bowel within the hernia sac is also dilated. Surgical consult recommended. 2. Question focal enteritis with stricture in the low anterior abdomen versus peristalsis. 3. Small volume free fluid in the hernia sac and pelvis. Electronically Signed   By: Minerva Fester M.D.   On: 11/06/2023 02:41    EKG: My personal interpretation of EKG shows: Sinus rhythm right bundle branch block.  Heart rate 60.    Assessment/Plan: Principal Problem:   Small bowel obstruction (HCC) Active Problems:   Essential hypertension   Reactive airway disease   History of diverticulitis of colon   Status post colostomy (HCC)   Colostomy present (HCC)   SBO (small bowel obstruction) (HCC)    Assessment and Plan: Small bowel obstruction History of diverticulitis and incarcerated hernia status post colectomy with colostomy bag since 2021 -Patient presenting with abdominal pain, nausea, vomiting for last 24 hours and did not have any stool output in the ostomy bag for last since Thursday.  Reported history of diverticulitis with incarcerated hernia underwent colectomy with colostomy bag placement in 2021. - Physical exam showing generalized abdominal tenderness 6 out of 10  intensity and decreased bowel sound.  Hemodynamically stable.  CBC and CMP grossly unremarkable. - CT abdomen pelvis showed: 1. Small bowel obstruction with transition point at the entry into the left parastomal hernia. The small bowel within the hernia sac is also dilated. Surgical consult recommended. 2. Question focal enteritis with stricture in the low anterior abdomen versus peristalsis. 3. Small volume free fluid in the hernia sac and pelvis. -ED physician consulted general surgery Dr. Hillery Hunter recommended placing NG tube, admit patient under hospitalist service and will see patient soon. - Plan to keep patient n.p.o., continue IV fluid LR 100 cc/h and maintenance NG tube with continuous intermittent suction. -Continue fentanyl 12.5 mg every 2 hour as needed for moderate and severe pain. -Continue Zofran as needed. - Will follow-up with general surgery for further recommendation.  Essential hypertension -Elevated blood pressure 167/97 in the context of abdominal pain due to SBO. - Holding oral blood pressure regimen - Continue hydralazine as needed.   Reactive airway disease -Stable.  O2 sat 100% room air.  Continue to check pulse ox and continue albuterol as needed.  DVT prophylaxis:  SCDs.  Deferring pharmacological prophylaxis in case patient will undergo any surgical intervention later today management of SBO Code Status:  Full Code Diet:  Family Communication:   Family was present at bedside, at the time of interview.  Opportunity was given to ask question and all questions were answered satisfactorily.  Disposition Plan: Pending improvement of small bowel obstruction.  Pending clinical disposition Consults: General Surgery Admission status:   Inpatient, Telemetry bed  Severity of Illness: The appropriate patient status for this patient is INPATIENT. Inpatient status is judged to be reasonable and necessary in order to provide the required intensity of service to ensure the  patient's safety. The patient's presenting symptoms, physical exam findings, and initial radiographic and laboratory data in the context of their chronic comorbidities is felt to place them at high risk for further clinical deterioration. Furthermore, it is not anticipated that the patient will be medically stable for discharge from the hospital within 2 midnights of admission.   * I certify that  at the point of admission it is my clinical judgment that the patient will require inpatient hospital care spanning beyond 2 midnights from the point of admission due to high intensity of service, high risk for further deterioration and high frequency of surveillance required.Marland Kitchen    Tereasa Coop, MD [facility]  How to contact the [facility] Attending or Consulting provider 7A - 7P or covering provider during after hours 7P -7A, for this patient.  Check the care team in [facility] and look for a) attending/consulting [facility] provider listed and b) the [facility] team listed Log into [email or web address] and use [place]'s universal password to access. If you do not have the password, please contact the hospital operator. Locate the [facility] provider you are looking for under [facility] and page to a number that you can be directly reached. If you still have difficulty reaching the provider, please page the [facility] (Director on Call) for the Hospitalists listed on amion for assistance.  11/06/2023, 3:37 AM

## 2023-11-07 DIAGNOSIS — K56609 Unspecified intestinal obstruction, unspecified as to partial versus complete obstruction: Secondary | ICD-10-CM | POA: Diagnosis not present

## 2023-11-07 LAB — COMPREHENSIVE METABOLIC PANEL
ALT: 14 U/L (ref 0–44)
AST: 15 U/L (ref 15–41)
Albumin: 3.4 g/dL — ABNORMAL LOW (ref 3.5–5.0)
Alkaline Phosphatase: 52 U/L (ref 38–126)
Anion gap: 7 (ref 5–15)
BUN: 7 mg/dL (ref 6–20)
CO2: 29 mmol/L (ref 22–32)
Calcium: 8.9 mg/dL (ref 8.9–10.3)
Chloride: 102 mmol/L (ref 98–111)
Creatinine, Ser: 1 mg/dL (ref 0.61–1.24)
GFR, Estimated: >60 mL/min (ref >60)
Glucose, Bld: 113 mg/dL — ABNORMAL HIGH (ref 70–99)
Potassium: 3.6 mmol/L (ref 3.5–5.1)
Sodium: 138 mmol/L (ref 135–145)
Total Bilirubin: 0.9 mg/dL (ref <1.2)
Total Protein: 6.4 g/dL — ABNORMAL LOW (ref 6.5–8.1)

## 2023-11-07 LAB — GLUCOSE, CAPILLARY
Glucose-Capillary: 103 mg/dL — ABNORMAL HIGH (ref 70–99)
Glucose-Capillary: 103 mg/dL — ABNORMAL HIGH (ref 70–99)

## 2023-11-07 LAB — LACTIC ACID, PLASMA: Lactic Acid, Venous: 0.9 mmol/L (ref 0.5–1.9)

## 2023-11-07 LAB — HEMOGLOBIN AND HEMATOCRIT, BLOOD
HCT: 44.9 % (ref 39.0–52.0)
Hemoglobin: 14.4 g/dL (ref 13.0–17.0)

## 2023-11-07 LAB — CBC
HCT: 43.1 % (ref 39.0–52.0)
Hemoglobin: 13.9 g/dL (ref 13.0–17.0)
MCH: 27.7 pg (ref 26.0–34.0)
MCHC: 32.3 g/dL (ref 30.0–36.0)
MCV: 86 fL (ref 80.0–100.0)
Platelets: 245 10*3/uL (ref 150–400)
RBC: 5.01 MIL/uL (ref 4.22–5.81)
RDW: 13.4 % (ref 11.5–15.5)
WBC: 8.6 10*3/uL (ref 4.0–10.5)
nRBC: 0 % (ref 0.0–0.2)

## 2023-11-07 LAB — PHOSPHORUS: Phosphorus: 4.7 mg/dL — ABNORMAL HIGH (ref 2.5–4.6)

## 2023-11-07 LAB — MAGNESIUM: Magnesium: 2.2 mg/dL (ref 1.7–2.4)

## 2023-11-07 MED ORDER — LACTATED RINGERS IV SOLN
INTRAVENOUS | Status: AC
Start: 2023-11-07 — End: 2023-11-08

## 2023-11-07 MED ORDER — PANTOPRAZOLE SODIUM 40 MG IV SOLR
40.0000 mg | Freq: Two times a day (BID) | INTRAVENOUS | Status: DC
  Administered 2023-11-07 – 2023-11-09 (×5): 40 mg via INTRAVENOUS
  Filled 2023-11-07 (×5): qty 10

## 2023-11-07 NOTE — Progress Notes (Signed)
   11/07/23 1348  TOC Brief Assessment  Insurance and Status Reviewed  Patient has primary care physician Yes  Home environment has been reviewed home/ wife  Prior level of function: independent  Prior/Current Home Services No current home services  Social Determinants of Health Reivew SDOH reviewed no interventions necessary  Readmission risk has been reviewed Yes  Transition of care needs no transition of care needs at this time     Continue NG tube for 24 hours, repeat KUB tomorrow   TOC will continue to follow for discharge needs

## 2023-11-07 NOTE — Progress Notes (Signed)
PROGRESS NOTE    Jordan [name] [name]  WUJ:[id number] DOB: [birth year] DOA: 11/05/2023 PCP: Sandre Kitty, MD    Brief Narrative:   Jordan [name] is a 42 y.o. male with past medical history significant for HTN, reactive airway disease, diverticulitis complicated by incarcerated hernia s/p colectomy with colostomy 2021 who presented to [facility] ED on 11/10 with progressive abdominal pain, nausea/vomiting since Thursday.  No reported bowel movement since Thursday.  Location mainly on left side of upper abdomen.  Also endorsed chills without fever.  Denies chest pain, no shortness of breath, no palpitations, no cough/headache.  In the ED, temperature 97.6 F, HR 70, RR 16, BP 157 105, SpO2 97% room air.  WBC 10.3, hemoglobin 14.1, platelet count 273.  Sodium 136, potassium 4.2, chloride 100, CO2 26, glucose 116, BUN less than 5, creatinine 1.09.  Lipase 23, AST 19, ALT 17, total bilirubin 1.0.  Urinalysis unrevealing.  CT abdomen/pelvis with small bowel obstruction with transition point at the entry into the left parastomal hernia, small bowel within the hernia sac is also dilated, question focal enteritis with stricture in the low anterior abdomen versus peristalsis, small volume free fluid in the hernia sac and pelvis.  EDP consulted general surgery; recommended placement of NG tube.  TRH consulted for admission for further evaluation management of small bowel obstruction.  Assessment & Plan:   Small bowel obstruction History of diverticulitis/incarcerated hernia s/p colectomy with colostomy The patient presenting to ED with progressive abdominal pain associate with nausea/vomiting.  No stool output since Thursday.  Patient is afebrile without leukocytosis.  CT abdomen/pelvis notable for small bowel obstruction with transition point at the entry into the left parastomal hernia, small bowel within the hernia sac is also dilated. -- General Surgery following, appreciate  assistance -- Continue NGT to LWIS -- NPO -- Dilaudid 1-2 mg IV q3h PRN moderate/severe pain -- Zofran as needed nausea -- LR at 100 mL/h -- continue to monitor NGT output, 1450 mL past 24h -- Ostomy RN following for management of ostomy -- further per general surgery  Essential Hypertension -- Holding home oral antihypertensives in the setting of n.p.o., small bowel obstruction as above -- Hydralazine 10mg  IV q8h PRN CBP >160 or DBP >110  Class II obesity Body mass index is 44.39 kg/m.  Complicates all facets of care    DVT prophylaxis: SCDs Start: 11/06/23 0325 Place TED hose Start: 11/06/23 0325    Code Status: Full Code Family Communication: Updated family present at bedside this morning  Disposition Plan:  Level of care: Telemetry Medical Status is: Inpatient Remains inpatient appropriate because: N.p.o., NG tube to low wall suction, IV fluids    Consultants:  General surgery  Procedures:  None  Antimicrobials:  None   Subjective: Patient seen examined bedside, resting calmly.  Lying in bed.  Abdominal pain much improved following decompression with NG tube that was placed yesterday.  Continues with NG tube in place to low wall suction; RN reports this morning some blood and suction.  Reports some air in his ostomy but no stool or liquid.  Hemoglobin was stable this morning, will repeat now and hold further suction until ensure stability. No other specific questions or concerns at this time.  Denies headache, no visual changes, no chest pain, no palpitations, no current nausea/vomiting, no diarrhea, no fever/chills/night sweats, no cough/congestion, no focal weakness, no paresthesia.  No acute events overnight per nurse staff.  Objective: Vitals:   11/06/23 1602 11/06/23 1619 11/06/23  2030 11/07/23 0753  BP:  (!) 151/93 (!) 139/95 128/75  Pulse:  (!) 57 73 (!) 55  Resp:  17 20 16   Temp: 98.3 F (36.8 C) 98.1 F (36.7 C) 98.2 F (36.8 C) 98.1 F (36.7 C)   TempSrc: Oral Oral Oral Oral  SpO2:  95% 100% 92%  Weight:      Height:        Intake/Output Summary (Last 24 hours) at 11/07/2023 1154 Last data filed at 11/07/2023 0735 Gross per 24 hour  Intake 223 ml  Output 3450 ml  Net -3227 ml   Filed Weights   11/05/23 2312  Weight: 124.7 kg    Examination:  Physical Exam: GEN: NAD, alert and oriented x 3, obese HEENT: NCAT, PERRL, EOMI, sclera clear, MMM PULM: CTAB w/o wheezes/crackles, normal respiratory effort CV: RRR w/o M/G/R GI: abd soft, + distention and TTP, no stool in ostomy collection bag, no R/G/M; NG tube noted with bilious drainage in suction canister MSK: no peripheral edema, moves all extremities independently NEURO: CN II-XII intact, no focal deficits, sensation to light touch intact PSYCH: normal mood/affect Integumentary: dry/intact, no rashes or wounds    Data Reviewed: I have personally reviewed following labs and imaging studies  CBC: Recent Labs  Lab 11/05/23 2317 11/06/23 0424 11/07/23 0723  WBC 10.3 10.8* 8.6  HGB 14.1 13.3 13.9  HCT 45.5 42.3 43.1  MCV 87.2 85.8 86.0  PLT 273 257 245   Basic Metabolic Panel: Recent Labs  Lab 11/05/23 2317 11/06/23 0424 11/07/23 0723  NA 136 135 138  K 4.2 3.8 3.6  CL 100 100 102  CO2 26 26 29   GLUCOSE 116* 114* 113*  BUN <5* <5* 7  CREATININE 1.09 0.94 1.00  CALCIUM 9.4 8.8* 8.9  MG  --   --  2.2  PHOS  --   --  4.7*   GFR: Estimated Creatinine Clearance: 118.8 mL/min (by C-G formula based on SCr of 1 mg/dL). Liver Function Tests: Recent Labs  Lab 11/05/23 2317 11/06/23 0424 11/07/23 0723  AST 19 16 15   ALT 17 16 14   ALKPHOS 61 56 52  BILITOT 1.0 0.9 0.9  PROT 6.9 6.5 6.4*  ALBUMIN 3.8 3.5 3.4*   Recent Labs  Lab 11/05/23 2317  LIPASE 23   No results for input(s): "AMMONIA" in the last 168 hours. Coagulation Profile: No results for input(s): "INR", "PROTIME" in the last 168 hours. Cardiac Enzymes: No results for input(s):  "CKTOTAL", "CKMB", "CKMBINDEX", "TROPONINI" in the last 168 hours. BNP (last 3 results) No results for input(s): "PROBNP" in the last 8760 hours. HbA1C: No results for input(s): "HGBA1C" in the last 72 hours. CBG: Recent Labs  Lab 11/06/23 0615 11/06/23 0828 11/06/23 1830 11/07/23 0326  GLUCAP 99 132* 100* 103*   Lipid Profile: No results for input(s): "CHOL", "HDL", "LDLCALC", "TRIG", "CHOLHDL", "LDLDIRECT" in the last 72 hours. Thyroid Function Tests: No results for input(s): "TSH", "T4TOTAL", "FREET4", "T3FREE", "THYROIDAB" in the last 72 hours. Anemia Panel: No results for input(s): "VITAMINB12", "FOLATE", "FERRITIN", "TIBC", "IRON", "RETICCTPCT" in the last 72 hours. Sepsis Labs: Recent Labs  Lab 11/07/23 0723  LATICACIDVEN 0.9    No results found for this or any previous visit (from the past 240 hour(s)).       Radiology Studies: DG Abd 1 View  Result Date: 11/06/2023 CLINICAL DATA:  [id number] Encounter for imaging study to confirm nasogastric (NG) tube placement [id number] EXAM: ABDOMEN - 1 VIEW COMPARISON:  CP x-ray  abdomen 11/06/2023 FINDINGS: Enteric tube courses below the hemidiaphragm with tip and side port overlying the expected region of the distal stomach and possible pylorus/first portion of the duodenum. Gaseous distension of several loops of small bowel. No radio-opaque calculi or other significant radiographic abnormality are seen. IMPRESSION: 1. Enteric tube courses below the hemidiaphragm with tip and side port overlying the expected region of the distal stomach and possible pylorus/first portion of the duodenum. Recommend retraction by 8 cm. 2. Small-bowel obstruction. Electronically Signed   By: Tish Frederickson M.D.   On: 11/06/2023 22:34   DG Abd Portable 1V-Small Bowel Obstruction Protocol-initial, 8 hr delay  Result Date: 11/06/2023 CLINICAL DATA:  8 hour delayed image for small bowel obstruction. EXAM: PORTABLE ABDOMEN - 1 VIEW COMPARISON:  Earlier today.  FINDINGS: There is a nasogastric tube within the stomach with tip in the expected location of the gastric antrum. Residual in tear contrast material noted within the gastric fundus. Dilated scratch set persistent small bowel dilatation with right upper quadrant small bowel loops measuring up to 5 cm. Dilute enteric contrast material is noted within right lower quadrant and central abdomen dilated small bowel. IMPRESSION: 1. Persistent small bowel dilatation compatible with small bowel obstruction. There is some dilute enteric contrast material within dilated small bowel loops in the central abdomen and right lower quadrant. No colonic enteric contrast material identified. 2. Nasogastric tube within the stomach. Electronically Signed   By: Signa Kell M.D.   On: 11/06/2023 15:08   DG Abd Portable 1V-Small Bowel Protocol-Position Verification  Result Date: 11/06/2023 CLINICAL DATA:  NG tube placement. EXAM: PORTABLE ABDOMEN - 1 VIEW COMPARISON:  10/05/2020.  CT scan from earlier same day FINDINGS: NG tube tip is positioned in the region of the pylorus. Tube could be pulled back 8-9 cm to lessen the possibility of transpyloric tip placement, as clinically warranted. Gaseous distention of small bowel loops again noted in the upper abdomen measuring up to 3.7 cm diameter. IMPRESSION: NG tube tip is positioned in the region of the pylorus. Tube could be pulled back 8-9 cm to lessen the possibility of transpyloric tip placement, as clinically warranted. Electronically Signed   By: Kennith Center M.D.   On: 11/06/2023 05:22   CT ABDOMEN PELVIS W CONTRAST  Result Date: 11/06/2023 CLINICAL DATA:  Abdominal pain, emesis. No ostomy output. History of incarcerated hernia. EXAM: CT ABDOMEN AND PELVIS WITH CONTRAST TECHNIQUE: Multidetector CT imaging of the abdomen and pelvis was performed using the standard protocol following bolus administration of intravenous contrast. RADIATION DOSE REDUCTION: This exam was  performed according to the departmental dose-optimization program which includes automated exposure control, adjustment of the mA and/or kV according to patient size and/or use of iterative reconstruction technique. CONTRAST:  75mL OMNIPAQUE IOHEXOL 350 MG/ML SOLN COMPARISON:  CT abdomen and pelvis 12/01/2020 FINDINGS: Lower chest: No acute abnormality. Hepatobiliary: Unremarkable. Pancreas: Mild fatty atrophy without acute abnormality. Spleen: Unremarkable. Adrenals/Urinary Tract: Stable adrenal glands. No urinary calculi or hydronephrosis. Unremarkable bladder. Stomach/Bowel: Stomach is within normal limits. Hartmann's pouch. Colonic diverticulosis without diverticulitis. Normal appendix. Small bowel dilation with air-fluid levels and transition point at the entry into the left parastomal hernia (circa series 3/image 50). The small bowel within the hernia sac is also dilated. Questionable area of focal small bowel wall thickening versus peristalsis in the low abdomen (series 6/image 97 and series 3/image 72). Vascular/Lymphatic: No significant vascular findings are present. No enlarged abdominal or pelvic lymph nodes. Reproductive: Unremarkable. Other: Small volume free  fluid in the hernia sac and pelvis. No free intraperitoneal air. Musculoskeletal: No acute fracture. IMPRESSION: 1. Small bowel obstruction with transition point at the entry into the left parastomal hernia. The small bowel within the hernia sac is also dilated. Surgical consult recommended. 2. Question focal enteritis with stricture in the low anterior abdomen versus peristalsis. 3. Small volume free fluid in the hernia sac and pelvis. Electronically Signed   By: Minerva Fester M.D.   On: 11/06/2023 02:41        Scheduled Meds:  pantoprazole (PROTONIX) IV  40 mg Intravenous Q12H   sodium chloride flush  3 mL Intravenous Q12H   sodium chloride flush  3 mL Intravenous Q12H   Continuous Infusions:  lactated ringers 75 mL/hr at 11/07/23  1001     LOS: 1 day    Time spent: 56 minutes spent on chart review, discussion with nursing staff, consultants, updating family and interview/physical exam; more than 50% of that time was spent in counseling and/or coordination of care.    Alvira Philips [place], DO [facility] Available via Epic secure chat 7am-7pm After these hours, please refer to coverage provider listed on [email or web address] 11/07/2023, 11:54 AM

## 2023-11-07 NOTE — Plan of Care (Signed)
  Problem: Education: Goal: Knowledge of General Education information will improve Description: Including pain rating scale, medication(s)/side effects and non-pharmacologic comfort measures Outcome: Progressing   Problem: Clinical Measurements: Goal: Respiratory complications will improve Outcome: Progressing Goal: Cardiovascular complication will be avoided Outcome: Progressing   Problem: Coping: Goal: Level of anxiety will decrease Outcome: Progressing   

## 2023-11-07 NOTE — Progress Notes (Signed)
HGB 14.4 patient NGT hooked back to [facility]

## 2023-11-07 NOTE — Plan of Care (Signed)

## 2023-11-07 NOTE — Progress Notes (Signed)
Subjective/Chief Complaint: Patient denies abdominal pain.  Only flatus in colostomy bag.   Objective: Vital signs in last 24 hours: Temp:  [97.8 F (36.6 C)-98.3 F (36.8 C)] 98.1 F (36.7 C) (11/11 0753) Pulse Rate:  [50-73] 55 (11/11 0753) Resp:  [14-20] 16 (11/11 0753) BP: (128-165)/(75-95) 128/75 (11/11 0753) SpO2:  [92 %-100 %] 92 % (11/11 0753) Last BM Date : 11/03/23  Intake/Output from previous day: 11/10 0701 - 11/11 0700 In: 634.9 [I.V.:444.9; NG/GT:90] Out: 2950 [Urine:400; Emesis/NG output:1650] Intake/Output this shift: Total I/O In: 30 [NG/GT:30] Out: 500 [Emesis/NG output:500]  Abdomen: Left lower quadrant colostomy soft nontender.  No obvious hernia defect noted.  Mild distention.  No peritonitis.  Lab Results:  Recent Labs    11/06/23 0424 11/07/23 0723  WBC 10.8* 8.6  HGB 13.3 13.9  HCT 42.3 43.1  PLT 257 245   BMET Recent Labs    11/06/23 0424 11/07/23 0723  NA 135 138  K 3.8 3.6  CL 100 102  CO2 26 29  GLUCOSE 114* 113*  BUN <5* 7  CREATININE 0.94 1.00  CALCIUM 8.8* 8.9   PT/INR No results for input(s): "LABPROT", "INR" in the last 72 hours. ABG No results for input(s): "PHART", "HCO3" in the last 72 hours.  Invalid input(s): "PCO2", "PO2"  Studies/Results: DG Abd 1 View  Result Date: 11/06/2023 CLINICAL DATA:  [id number] Encounter for imaging study to confirm nasogastric (NG) tube placement [id number] EXAM: ABDOMEN - 1 VIEW COMPARISON:  CP x-ray abdomen 11/06/2023 FINDINGS: Enteric tube courses below the hemidiaphragm with tip and side port overlying the expected region of the distal stomach and possible pylorus/first portion of the duodenum. Gaseous distension of several loops of small bowel. No radio-opaque calculi or other significant radiographic abnormality are seen. IMPRESSION: 1. Enteric tube courses below the hemidiaphragm with tip and side port overlying the expected region of the distal stomach and possible pylorus/first  portion of the duodenum. Recommend retraction by 8 cm. 2. Small-bowel obstruction. Electronically Signed   By: Tish Frederickson M.D.   On: 11/06/2023 22:34   DG Abd Portable 1V-Small Bowel Obstruction Protocol-initial, 8 hr delay  Result Date: 11/06/2023 CLINICAL DATA:  8 hour delayed image for small bowel obstruction. EXAM: PORTABLE ABDOMEN - 1 VIEW COMPARISON:  Earlier today. FINDINGS: There is a nasogastric tube within the stomach with tip in the expected location of the gastric antrum. Residual in tear contrast material noted within the gastric fundus. Dilated scratch set persistent small bowel dilatation with right upper quadrant small bowel loops measuring up to 5 cm. Dilute enteric contrast material is noted within right lower quadrant and central abdomen dilated small bowel. IMPRESSION: 1. Persistent small bowel dilatation compatible with small bowel obstruction. There is some dilute enteric contrast material within dilated small bowel loops in the central abdomen and right lower quadrant. No colonic enteric contrast material identified. 2. Nasogastric tube within the stomach. Electronically Signed   By: Signa Kell M.D.   On: 11/06/2023 15:08   DG Abd Portable 1V-Small Bowel Protocol-Position Verification  Result Date: 11/06/2023 CLINICAL DATA:  NG tube placement. EXAM: PORTABLE ABDOMEN - 1 VIEW COMPARISON:  10/05/2020.  CT scan from earlier same day FINDINGS: NG tube tip is positioned in the region of the pylorus. Tube could be pulled back 8-9 cm to lessen the possibility of transpyloric tip placement, as clinically warranted. Gaseous distention of small bowel loops again noted in the upper abdomen measuring up to 3.7 cm diameter. IMPRESSION: NG tube  tip is positioned in the region of the pylorus. Tube could be pulled back 8-9 cm to lessen the possibility of transpyloric tip placement, as clinically warranted. Electronically Signed   By: Kennith Center M.D.   On: 11/06/2023 05:22   CT ABDOMEN  PELVIS W CONTRAST  Result Date: 11/06/2023 CLINICAL DATA:  Abdominal pain, emesis. No ostomy output. History of incarcerated hernia. EXAM: CT ABDOMEN AND PELVIS WITH CONTRAST TECHNIQUE: Multidetector CT imaging of the abdomen and pelvis was performed using the standard protocol following bolus administration of intravenous contrast. RADIATION DOSE REDUCTION: This exam was performed according to the departmental dose-optimization program which includes automated exposure control, adjustment of the mA and/or kV according to patient size and/or use of iterative reconstruction technique. CONTRAST:  75mL OMNIPAQUE IOHEXOL 350 MG/ML SOLN COMPARISON:  CT abdomen and pelvis 12/01/2020 FINDINGS: Lower chest: No acute abnormality. Hepatobiliary: Unremarkable. Pancreas: Mild fatty atrophy without acute abnormality. Spleen: Unremarkable. Adrenals/Urinary Tract: Stable adrenal glands. No urinary calculi or hydronephrosis. Unremarkable bladder. Stomach/Bowel: Stomach is within normal limits. Hartmann's pouch. Colonic diverticulosis without diverticulitis. Normal appendix. Small bowel dilation with air-fluid levels and transition point at the entry into the left parastomal hernia (circa series 3/image 50). The small bowel within the hernia sac is also dilated. Questionable area of focal small bowel wall thickening versus peristalsis in the low abdomen (series 6/image 97 and series 3/image 72). Vascular/Lymphatic: No significant vascular findings are present. No enlarged abdominal or pelvic lymph nodes. Reproductive: Unremarkable. Other: Small volume free fluid in the hernia sac and pelvis. No free intraperitoneal air. Musculoskeletal: No acute fracture. IMPRESSION: 1. Small bowel obstruction with transition point at the entry into the left parastomal hernia. The small bowel within the hernia sac is also dilated. Surgical consult recommended. 2. Question focal enteritis with stricture in the low anterior abdomen versus  peristalsis. 3. Small volume free fluid in the hernia sac and pelvis. Electronically Signed   By: Minerva Fester M.D.   On: 11/06/2023 02:41    Anti-infectives: Anti-infectives (From admission, onward)    None       Assessment/Plan: 42 y/o M w/ a hx of colovesical fistula s/p takedown c/b anastomotic leak requiring end colostomy creation who presents with abdominal pain and decreased stoma output and has a CT showing pSBO   Films show persistent small bowel dilation.  Continue NG tube for 24 hours, repeat KUB tomorrow morning keep n.p.o. for now  Patient does self dilation to his colostomy which she can do while in hospital.  Discussed possible surgical options given parastomal hernia and retracted colostomy.  He has been seen at [facility] in follow-up for the last 3 years but colostomy reversal's has been put off due to his BMI.  He now has obstruction and this may be more urgent.  Questions answered at bedside with patient and wife.   LOS: 1 day    Dortha Schwalbe MD  11/07/2023 MODERATE COMPLEXITY

## 2023-11-07 NOTE — Progress Notes (Signed)
Pt started to have new blood coming from NGT. Dr [place] made aware. NGT clamped. HGB will be rechecked and if stable will restart NGT to wall suction. Also will start Protonix. Likely irritation from the NG tube causing the blood per MD. Pt made aware.

## 2023-11-08 ENCOUNTER — Inpatient Hospital Stay (HOSPITAL_COMMUNITY): Payer: Medicare HMO

## 2023-11-08 DIAGNOSIS — K56609 Unspecified intestinal obstruction, unspecified as to partial versus complete obstruction: Secondary | ICD-10-CM | POA: Diagnosis not present

## 2023-11-08 LAB — CBC
HCT: 39.8 % (ref 39.0–52.0)
Hemoglobin: 12.8 g/dL — ABNORMAL LOW (ref 13.0–17.0)
MCH: 27.8 pg (ref 26.0–34.0)
MCHC: 32.2 g/dL (ref 30.0–36.0)
MCV: 86.5 fL (ref 80.0–100.0)
Platelets: 220 10*3/uL (ref 150–400)
RBC: 4.6 MIL/uL (ref 4.22–5.81)
RDW: 13.2 % (ref 11.5–15.5)
WBC: 5.5 10*3/uL (ref 4.0–10.5)
nRBC: 0 % (ref 0.0–0.2)

## 2023-11-08 LAB — GLUCOSE, CAPILLARY
Glucose-Capillary: 99 mg/dL (ref 70–99)
Glucose-Capillary: 131 mg/dL — ABNORMAL HIGH (ref 70–99)
Glucose-Capillary: 111 mg/dL — ABNORMAL HIGH (ref 70–99)
Glucose-Capillary: 92 mg/dL (ref 70–99)

## 2023-11-08 LAB — BASIC METABOLIC PANEL
Anion gap: 10 (ref 5–15)
BUN: 7 mg/dL (ref 6–20)
CO2: 29 mmol/L (ref 22–32)
Calcium: 8.6 mg/dL — ABNORMAL LOW (ref 8.9–10.3)
Chloride: 99 mmol/L (ref 98–111)
Creatinine, Ser: 1.07 mg/dL (ref 0.61–1.24)
GFR, Estimated: >60 mL/min (ref >60)
Glucose, Bld: 92 mg/dL (ref 70–99)
Potassium: 4.2 mmol/L (ref 3.5–5.1)
Sodium: 138 mmol/L (ref 135–145)

## 2023-11-08 MED ORDER — POLYETHYLENE GLYCOL 3350 17 G PO PACK
17.0000 g | PACK | Freq: Every day | ORAL | Status: DC
  Administered 2023-11-08 – 2023-11-09 (×2): 17 g via ORAL
  Filled 2023-11-08 (×2): qty 1

## 2023-11-08 MED ORDER — OXYCODONE HCL 5 MG PO TABS
5.0000 mg | ORAL_TABLET | ORAL | Status: DC | PRN
  Administered 2023-11-08 – 2023-11-09 (×2): 5 mg via ORAL
  Filled 2023-11-08 (×2): qty 1

## 2023-11-08 NOTE — Progress Notes (Signed)
ostomy belt delivered to pt room

## 2023-11-08 NOTE — Progress Notes (Signed)
PROGRESS NOTE    Jordan [name]  ONG:[id number] DOB: 11-07-1981 DOA: 11/05/2023 PCP: Sandre Kitty, MD    Brief Narrative:   Jordan [name] is a 42 y.o. male with past medical history significant for HTN, reactive airway disease, diverticulitis complicated by incarcerated hernia s/p colectomy with colostomy 2021 who presented to [facility] ED on 11/10 with progressive abdominal pain, nausea/vomiting since Thursday.  No reported bowel movement since Thursday.  Location mainly on left side of upper abdomen.  Also endorsed chills without fever.  Denies chest pain, no shortness of breath, no palpitations, no cough/headache.  In the ED, temperature 97.6 F, HR 70, RR 16, BP 157 105, SpO2 97% room air.  WBC 10.3, hemoglobin 14.1, platelet count 273.  Sodium 136, potassium 4.2, chloride 100, CO2 26, glucose 116, BUN less than 5, creatinine 1.09.  Lipase 23, AST 19, ALT 17, total bilirubin 1.0.  Urinalysis unrevealing.  CT abdomen/pelvis with small bowel obstruction with transition point at the entry into the left parastomal hernia, small bowel within the hernia sac is also dilated, question focal enteritis with stricture in the low anterior abdomen versus peristalsis, small volume free fluid in the hernia sac and pelvis.  EDP consulted general surgery; recommended placement of NG tube.  TRH consulted for admission for further evaluation management of small bowel obstruction.  Assessment & Plan:   Small bowel obstruction History of diverticulitis/incarcerated hernia s/p colectomy with colostomy The patient presenting to ED with progressive abdominal pain associate with nausea/vomiting.  No stool output since Thursday.  Patient is afebrile without leukocytosis.  CT abdomen/pelvis notable for small bowel obstruction with transition point at the entry into the left parastomal hernia, small bowel within the hernia sac is also dilated. -- General Surgery following, appreciate  assistance -- NG tube discontinued today, started on clear liquid diet -- Dilaudid 1-2 mg IV q3h PRN moderate/severe pain -- Zofran as needed nausea -- Ostomy RN following for management of ostomy -- further per general surgery  Essential Hypertension -- Holding home oral antihypertensives for now -- Hydralazine 10mg  IV q8h PRN CBP >160 or DBP >110  Class II obesity Body mass index is 44.39 kg/m.  Complicates all facets of care    DVT prophylaxis: SCDs Start: 11/06/23 0325 Place TED hose Start: 11/06/23 0325    Code Status: Full Code Family Communication: No family present at bedside this morning  Disposition Plan:  Level of care: Telemetry Medical Status is: Inpatient Remains inpatient appropriate because: Positioning to clear liquid diet, NG tube discontinued this morning    Consultants:  General surgery  Procedures:  None  Antimicrobials:  None   Subjective: Patient seen examined bedside, resting calmly.  Lying in bed.  Abdominal pain much improved.  Abdominal x-ray shows contrast throughout colon.  Now with stool in his ostomy.  Seen by general surgery and NG tube now discontinued and started on clear liquid diet. No other specific questions or concerns at this time.  Denies headache, no visual changes, no chest pain, no palpitations, no current nausea/vomiting, no diarrhea, no fever/chills/night sweats, no cough/congestion, no focal weakness, no paresthesia.  No acute events overnight per nursing staff.  Objective: Vitals:   11/07/23 1520 11/07/23 2035 11/08/23 0533 11/08/23 0832  BP: 117/67 (!) 140/80 135/79 131/69  Pulse: (!) 54 68 (!) 54 (!) 57  Resp: 16  18 18   Temp: 98.1 F (36.7 C) 98.4 F (36.9 C) 97.9 F (36.6 C) 98.2 F (36.8 C)  TempSrc:  Oral Oral Oral   SpO2: 97% 100% 98% 97%  Weight:      Height:        Intake/Output Summary (Last 24 hours) at 11/08/2023 1222 Last data filed at 11/08/2023 1019 Gross per 24 hour  Intake 776.73 ml  Output  800 ml  Net -23.27 ml   Filed Weights   11/05/23 2312  Weight: 124.7 kg    Examination:  Physical Exam: GEN: NAD, alert and oriented x 3, obese HEENT: NCAT, PERRL, EOMI, sclera clear, MMM PULM: CTAB w/o wheezes/crackles, normal respiratory effort CV: RRR w/o M/G/R GI: abd soft, + mild distention, NTTP, + stool noted in ostomy collection bag, no R/G/M MSK: no peripheral edema, moves all extremities independently NEURO: CN II-XII intact, no focal deficits, sensation to light touch intact PSYCH: normal mood/affect Integumentary: dry/intact, no rashes or wounds    Data Reviewed: I have personally reviewed following labs and imaging studies  CBC: Recent Labs  Lab 11/05/23 2317 11/06/23 0424 11/07/23 0723 11/07/23 1219 11/08/23 0624  WBC 10.3 10.8* 8.6  --  5.5  HGB 14.1 13.3 13.9 14.4 12.8*  HCT 45.5 42.3 43.1 44.9 39.8  MCV 87.2 85.8 86.0  --  86.5  PLT 273 257 245  --  220   Basic Metabolic Panel: Recent Labs  Lab 11/05/23 2317 11/06/23 0424 11/07/23 0723 11/08/23 0624  NA 136 135 138 138  K 4.2 3.8 3.6 4.2  CL 100 100 102 99  CO2 26 26 29 29   GLUCOSE 116* 114* 113* 92  BUN <5* <5* 7 7  CREATININE 1.09 0.94 1.00 1.07  CALCIUM 9.4 8.8* 8.9 8.6*  MG  --   --  2.2  --   PHOS  --   --  4.7*  --    GFR: Estimated Creatinine Clearance: 111.1 mL/min (by C-G formula based on SCr of 1.07 mg/dL). Liver Function Tests: Recent Labs  Lab 11/05/23 2317 11/06/23 0424 11/07/23 0723  AST 19 16 15   ALT 17 16 14   ALKPHOS 61 56 52  BILITOT 1.0 0.9 0.9  PROT 6.9 6.5 6.4*  ALBUMIN 3.8 3.5 3.4*   Recent Labs  Lab 11/05/23 2317  LIPASE 23   No results for input(s): "AMMONIA" in the last 168 hours. Coagulation Profile: No results for input(s): "INR", "PROTIME" in the last 168 hours. Cardiac Enzymes: No results for input(s): "CKTOTAL", "CKMB", "CKMBINDEX", "TROPONINI" in the last 168 hours. BNP (last 3 results) No results for input(s): "PROBNP" in the last 8760  hours. HbA1C: No results for input(s): "HGBA1C" in the last 72 hours. CBG: Recent Labs  Lab 11/06/23 1830 11/07/23 0326 11/07/23 1622 11/08/23 0830 11/08/23 1155  GLUCAP 100* 103* 103* 99 131*   Lipid Profile: No results for input(s): "CHOL", "HDL", "LDLCALC", "TRIG", "CHOLHDL", "LDLDIRECT" in the last 72 hours. Thyroid Function Tests: No results for input(s): "TSH", "T4TOTAL", "FREET4", "T3FREE", "THYROIDAB" in the last 72 hours. Anemia Panel: No results for input(s): "VITAMINB12", "FOLATE", "FERRITIN", "TIBC", "IRON", "RETICCTPCT" in the last 72 hours. Sepsis Labs: Recent Labs  Lab 11/07/23 0723  LATICACIDVEN 0.9    No results found for this or any previous visit (from the past 240 hour(s)).       Radiology Studies: DG Abd 1 View  Result Date: 11/08/2023 CLINICAL DATA:  Small-bowel obstruction EXAM: ABDOMEN - 1 VIEW COMPARISON:  11/06/2023 x-ray and older FINDINGS: There is contrast along the nondilated colon diffusely. Several dilated loops of bowel in the midabdomen. Enteric tube with tip along  the right side of the midabdomen. This could be in the duodenal. No obvious free air on this supine radiograph. IMPRESSION: Persistent dilated loops of bowel in the midabdomen, likely small bowel. The colon is decompressed but has luminal contrast from previous exam. Enteric tube in place.  This could be in the duodenal. Electronically Signed   By: Karen Kays M.D.   On: 11/08/2023 10:54   DG Abd 1 View  Result Date: 11/06/2023 CLINICAL DATA:  [id number] Encounter for imaging study to confirm nasogastric (NG) tube placement [id number] EXAM: ABDOMEN - 1 VIEW COMPARISON:  CP x-ray abdomen 11/06/2023 FINDINGS: Enteric tube courses below the hemidiaphragm with tip and side port overlying the expected region of the distal stomach and possible pylorus/first portion of the duodenum. Gaseous distension of several loops of small bowel. No radio-opaque calculi or other significant radiographic  abnormality are seen. IMPRESSION: 1. Enteric tube courses below the hemidiaphragm with tip and side port overlying the expected region of the distal stomach and possible pylorus/first portion of the duodenum. Recommend retraction by 8 cm. 2. Small-bowel obstruction. Electronically Signed   By: Tish Frederickson M.D.   On: 11/06/2023 22:34   DG Abd Portable 1V-Small Bowel Obstruction Protocol-initial, 8 hr delay  Result Date: 11/06/2023 CLINICAL DATA:  8 hour delayed image for small bowel obstruction. EXAM: PORTABLE ABDOMEN - 1 VIEW COMPARISON:  Earlier today. FINDINGS: There is a nasogastric tube within the stomach with tip in the expected location of the gastric antrum. Residual in tear contrast material noted within the gastric fundus. Dilated scratch set persistent small bowel dilatation with right upper quadrant small bowel loops measuring up to 5 cm. Dilute enteric contrast material is noted within right lower quadrant and central abdomen dilated small bowel. IMPRESSION: 1. Persistent small bowel dilatation compatible with small bowel obstruction. There is some dilute enteric contrast material within dilated small bowel loops in the central abdomen and right lower quadrant. No colonic enteric contrast material identified. 2. Nasogastric tube within the stomach. Electronically Signed   By: Signa Kell M.D.   On: 11/06/2023 15:08        Scheduled Meds:  pantoprazole (PROTONIX) IV  40 mg Intravenous Q12H   polyethylene glycol  17 g Oral Daily   sodium chloride flush  3 mL Intravenous Q12H   Continuous Infusions:     LOS: 2 days    Time spent: 56 minutes spent on chart review, discussion with nursing staff, consultants, updating family and interview/physical exam; more than 50% of that time was spent in counseling and/or coordination of care.    Alvira Philips [place], DO [facility] Available via Epic secure chat 7am-7pm After these hours, please refer to coverage provider listed on  [email or web address] 11/08/2023, 12:22 PM

## 2023-11-08 NOTE — Plan of Care (Signed)
  Problem: Education: Goal: Knowledge of General Education information will improve Description: Including pain rating scale, medication(s)/side effects and non-pharmacologic comfort measures Outcome: Progressing   Problem: Health Behavior/Discharge Planning: Goal: Ability to manage health-related needs will improve Outcome: Progressing   Problem: Nutrition: Goal: Adequate nutrition will be maintained Outcome: Progressing   Problem: Coping: Goal: Level of anxiety will decrease Outcome: Progressing   Problem: Elimination: Goal: Will not experience complications related to urinary retention Outcome: Progressing

## 2023-11-08 NOTE — Progress Notes (Signed)
   Subjective/Chief Complaint: Patient having bowel function with stool in his bag.  Films show contrast throughout his colon.  Denies nausea or vomiting.  No abdominal pain.   Objective: Vital signs in last 24 hours: Temp:  [97.9 F (36.6 C)-98.4 F (36.9 C)] 97.9 F (36.6 C) (11/12 0533) Pulse Rate:  [54-68] 54 (11/12 0533) Resp:  [16-18] 18 (11/12 0533) BP: (117-140)/(67-80) 135/79 (11/12 0533) SpO2:  [97 %-100 %] 98 % (11/12 0533) Last BM Date : 11/03/23  Intake/Output from previous day: 11/11 0701 - 11/12 0700 In: 446.7 [I.V.:416.7; NG/GT:30] Out: 800 [Emesis/NG output:800] Intake/Output this shift: No intake/output data recorded.  Abdomen: Less distended soft without rebound guarding.  No evidence of parastomal hernia on exam.  Stool in bag.  Lab Results:  Recent Labs    11/07/23 0723 11/07/23 1219 11/08/23 0624  WBC 8.6  --  5.5  HGB 13.9 14.4 12.8*  HCT 43.1 44.9 39.8  PLT 245  --  220   BMET Recent Labs    11/07/23 0723 11/08/23 0624  NA 138 138  K 3.6 4.2  CL 102 99  CO2 29 29  GLUCOSE 113* 92  BUN 7 7  CREATININE 1.00 1.07  CALCIUM 8.9 8.6*   PT/INR No results for input(s): "LABPROT", "INR" in the last 72 hours. ABG No results for input(s): "PHART", "HCO3" in the last 72 hours.  Invalid input(s): "PCO2", "PO2"  Studies/Results: DG Abd 1 View  Result Date: 11/06/2023 CLINICAL DATA:  [id number] Encounter for imaging study to confirm nasogastric (NG) tube placement [id number] EXAM: ABDOMEN - 1 VIEW COMPARISON:  CP x-ray abdomen 11/06/2023 FINDINGS: Enteric tube courses below the hemidiaphragm with tip and side port overlying the expected region of the distal stomach and possible pylorus/first portion of the duodenum. Gaseous distension of several loops of small bowel. No radio-opaque calculi or other significant radiographic abnormality are seen. IMPRESSION: 1. Enteric tube courses below the hemidiaphragm with tip and side port overlying the expected  region of the distal stomach and possible pylorus/first portion of the duodenum. Recommend retraction by 8 cm. 2. Small-bowel obstruction. Electronically Signed   By: Tish Frederickson M.D.   On: 11/06/2023 22:34   DG Abd Portable 1V-Small Bowel Obstruction Protocol-initial, 8 hr delay  Result Date: 11/06/2023 CLINICAL DATA:  8 hour delayed image for small bowel obstruction. EXAM: PORTABLE ABDOMEN - 1 VIEW COMPARISON:  Earlier today. FINDINGS: There is a nasogastric tube within the stomach with tip in the expected location of the gastric antrum. Residual in tear contrast material noted within the gastric fundus. Dilated scratch set persistent small bowel dilatation with right upper quadrant small bowel loops measuring up to 5 cm. Dilute enteric contrast material is noted within right lower quadrant and central abdomen dilated small bowel. IMPRESSION: 1. Persistent small bowel dilatation compatible with small bowel obstruction. There is some dilute enteric contrast material within dilated small bowel loops in the central abdomen and right lower quadrant. No colonic enteric contrast material identified. 2. Nasogastric tube within the stomach. Electronically Signed   By: Signa Kell M.D.   On: 11/06/2023 15:08    Anti-infectives: Anti-infectives (From admission, onward)    None       Assessment/Plan: Partial small bowel obstruction-this appears to be resolving.  Parastomal hernia reduced.  Discontinue NG tube and start clear liquid diet  Continue ambulation   LOS: 2 days    Dortha Schwalbe MD 11/08/2023 Moderate complexity decision making

## 2023-11-08 NOTE — Progress Notes (Signed)
Secretary ordered pt abdominal binder per order

## 2023-11-09 DIAGNOSIS — K56609 Unspecified intestinal obstruction, unspecified as to partial versus complete obstruction: Secondary | ICD-10-CM | POA: Diagnosis not present

## 2023-11-09 LAB — CBC
HCT: 40.2 % (ref 39.0–52.0)
Hemoglobin: 12.9 g/dL — ABNORMAL LOW (ref 13.0–17.0)
MCH: 27.6 pg (ref 26.0–34.0)
MCHC: 32.1 g/dL (ref 30.0–36.0)
MCV: 85.9 fL (ref 80.0–100.0)
Platelets: 244 10*3/uL (ref 150–400)
RBC: 4.68 MIL/uL (ref 4.22–5.81)
RDW: 13 % (ref 11.5–15.5)
WBC: 6.6 10*3/uL (ref 4.0–10.5)
nRBC: 0 % (ref 0.0–0.2)

## 2023-11-09 LAB — MAGNESIUM: Magnesium: 2 mg/dL (ref 1.7–2.4)

## 2023-11-09 LAB — GLUCOSE, CAPILLARY
Glucose-Capillary: 128 mg/dL — ABNORMAL HIGH (ref 70–99)
Glucose-Capillary: 96 mg/dL (ref 70–99)

## 2023-11-09 LAB — BASIC METABOLIC PANEL
Anion gap: 8 (ref 5–15)
BUN: 5 mg/dL — ABNORMAL LOW (ref 6–20)
CO2: 29 mmol/L (ref 22–32)
Calcium: 8.7 mg/dL — ABNORMAL LOW (ref 8.9–10.3)
Chloride: 99 mmol/L (ref 98–111)
Creatinine, Ser: 0.94 mg/dL (ref 0.61–1.24)
GFR, Estimated: >60 mL/min (ref >60)
Glucose, Bld: 104 mg/dL — ABNORMAL HIGH (ref 70–99)
Potassium: 3.7 mmol/L (ref 3.5–5.1)
Sodium: 136 mmol/L (ref 135–145)

## 2023-11-09 MED ORDER — POLYETHYLENE GLYCOL 3350 17 G PO PACK: 17.0000 g | PACK | Freq: Every day | ORAL | 0 refills | Status: DC

## 2023-11-09 MED ORDER — ONDANSETRON HCL 4 MG PO TABS: 4.0000 mg | ORAL_TABLET | Freq: Three times a day (TID) | ORAL | 0 refills | Status: DC | PRN

## 2023-11-09 MED ORDER — OXYCODONE HCL 5 MG PO TABS: 5.0000 mg | ORAL_TABLET | Freq: Four times a day (QID) | ORAL | 0 refills | Status: DC | PRN

## 2023-11-09 NOTE — Progress Notes (Signed)
Pt does not want BS checks

## 2023-11-09 NOTE — Discharge Summary (Signed)
Physician Discharge Summary  Dalten Dembek Seville III DGU:[id number] DOB: 03-Apr-1981 DOA: 11/05/2023  PCP: Sandre Kitty, MD  Admit date: 11/05/2023 Discharge date: 11/09/2023  Admitted From: Home Disposition: Home  Recommendations for Outpatient Follow-up:  Follow up with PCP in 1 week with repeat CBC/BMP Outpatient follow-up with general surgery Follow up in ED if symptoms worsen or new appear   Home Health: No Equipment/Devices: None  Discharge Condition: Stable CODE STATUS: Full Diet recommendation: Regular  Brief/Interim Summary:  42 y.o. male with past medical history significant for HTN, reactive airway disease, diverticulitis complicated by incarcerated hernia s/p colectomy with colostomy in 2021 presented with increasing abdominal pain, nausea and vomiting.  He was found to have small bowel obstruction.  General surgery was consulted and he was managed conservatively.  Needed NG tube initially with IV fluids and IV analgesics.  Subsequently, his condition has improved: NG tube has been removed and diet has been advanced to regular diet.  Bowel function has returned.  General surgery cleared him for discharge.  Discharge home today with outpatient follow-up with general surgery.    Discharge Diagnoses:   Partial small bowel obstruction possibly due to parastomal hernia History of diverticulitis/incarcerated hernia status post colectomy with colostomy -General surgery was consulted and he was managed conservatively.  Needed NG tube initially with IV fluids and IV analgesics.  Subsequently, his condition has improved: NG tube has been removed and diet has been advanced to regular diet.  Bowel function has returned.  General surgery cleared him for discharge.  Discharge home today with outpatient follow-up with general surgery.    Essential hypertension--blood pressure intermittently on the higher side.  Not taking any antihypertensives as an outpatient.  Outpatient  follow-up with PCP  Morbid obesity -Outpatient follow-up  Discharge Instructions  Discharge Instructions     Diet general   Complete by: As directed    Increase activity slowly   Complete by: As directed       Allergies as of 11/09/2023       Reactions   Butrans [buprenorphine Hcl]    Difficulty breathing, n/v   Nucynta [tapentadol Hydrochloride] Anaphylaxis   Glucose Polymer Other (See Comments)   poison   Sumac    poison   Bactrim [sulfamethoxazole-trimethoprim] Itching, Rash        Medication List     STOP taking these medications    valsartan 80 MG tablet Commonly known as: DIOVAN       TAKE these medications    albuterol 108 (90 Base) MCG/ACT inhaler Commonly known as: VENTOLIN HFA Inhale 2 puffs into the lungs every 6 (six) hours as needed for wheezing or shortness of breath.   aspirin 81 MG tablet Take 81 mg by mouth daily.   Blood Pressure Monitor Devi Check bp daily in the am   cetirizine 10 MG tablet Commonly known as: ZYRTEC Take 1 tablet (10 mg total) by mouth daily.   ondansetron 4 MG tablet Commonly known as: Zofran Take 1 tablet (4 mg total) by mouth every 8 (eight) hours as needed for nausea or vomiting.   oxyCODONE 5 MG immediate release tablet Commonly known as: Oxy IR/ROXICODONE Take 1 tablet (5 mg total) by mouth every 6 (six) hours as needed for moderate pain (pain score 4-6).   polyethylene glycol 17 g packet Commonly known as: MIRALAX / GLYCOLAX Take 17 g by mouth daily. Start taking on: November 10, 2023        Follow-up Information  Sandre Kitty, MD. Schedule an appointment as soon as possible for a visit in 1 week(s).   Specialty: Family Medicine Contact information: [address] [place] [address] [phone]         Surgery, [place]. Schedule an appointment as soon as possible for a visit in 1 week(s).   Specialty: General Surgery Contact information: [address] [place] [address] [phone]                Allergies  Allergen Reactions   Butrans [Buprenorphine Hcl]     Difficulty breathing, n/v   Nucynta [Tapentadol Hydrochloride] Anaphylaxis   Glucose Polymer Other (See Comments)    poison   Sumac     poison   Bactrim [Sulfamethoxazole-Trimethoprim] Itching and Rash    Consultations: General surgery   Procedures/Studies: DG Abd 1 View  Result Date: 11/08/2023 CLINICAL DATA:  Small-bowel obstruction EXAM: ABDOMEN - 1 VIEW COMPARISON:  11/06/2023 x-ray and older FINDINGS: There is contrast along the nondilated colon diffusely. Several dilated loops of bowel in the midabdomen. Enteric tube with tip along the right side of the midabdomen. This could be in the duodenal. No obvious free air on this supine radiograph. IMPRESSION: Persistent dilated loops of bowel in the midabdomen, likely small bowel. The colon is decompressed but has luminal contrast from previous exam. Enteric tube in place.  This could be in the duodenal. Electronically Signed   By: Karen Kays M.D.   On: 11/08/2023 10:54   DG Abd 1 View  Result Date: 11/06/2023 CLINICAL DATA:  [id number] Encounter for imaging study to confirm nasogastric (NG) tube placement [id number] EXAM: ABDOMEN - 1 VIEW COMPARISON:  CP x-ray abdomen 11/06/2023 FINDINGS: Enteric tube courses below the hemidiaphragm with tip and side port overlying the expected region of the distal stomach and possible pylorus/first portion of the duodenum. Gaseous distension of several loops of small bowel. No radio-opaque calculi or other significant radiographic abnormality are seen. IMPRESSION: 1. Enteric tube courses below the hemidiaphragm with tip and side port overlying the expected region of the distal stomach and possible pylorus/first portion of the duodenum. Recommend retraction by 8 cm. 2. Small-bowel obstruction. Electronically Signed   By: Tish Frederickson M.D.   On: 11/06/2023 22:34   DG Abd Portable  1V-Small Bowel Obstruction Protocol-initial, 8 hr delay  Result Date: 11/06/2023 CLINICAL DATA:  8 hour delayed image for small bowel obstruction. EXAM: PORTABLE ABDOMEN - 1 VIEW COMPARISON:  Earlier today. FINDINGS: There is a nasogastric tube within the stomach with tip in the expected location of the gastric antrum. Residual in tear contrast material noted within the gastric fundus. Dilated scratch set persistent small bowel dilatation with right upper quadrant small bowel loops measuring up to 5 cm. Dilute enteric contrast material is noted within right lower quadrant and central abdomen dilated small bowel. IMPRESSION: 1. Persistent small bowel dilatation compatible with small bowel obstruction. There is some dilute enteric contrast material within dilated small bowel loops in the central abdomen and right lower quadrant. No colonic enteric contrast material identified. 2. Nasogastric tube within the stomach. Electronically Signed   By: Signa Kell M.D.   On: 11/06/2023 15:08   DG Abd Portable 1V-Small Bowel Protocol-Position Verification  Result Date: 11/06/2023 CLINICAL DATA:  NG tube placement. EXAM: PORTABLE ABDOMEN - 1 VIEW COMPARISON:  10/05/2020.  CT scan from earlier same day FINDINGS: NG tube tip is positioned in the region of the pylorus. Tube could be  pulled back 8-9 cm to lessen the possibility of transpyloric tip placement, as clinically warranted. Gaseous distention of small bowel loops again noted in the upper abdomen measuring up to 3.7 cm diameter. IMPRESSION: NG tube tip is positioned in the region of the pylorus. Tube could be pulled back 8-9 cm to lessen the possibility of transpyloric tip placement, as clinically warranted. Electronically Signed   By: Kennith Center M.D.   On: 11/06/2023 05:22   CT ABDOMEN PELVIS W CONTRAST  Result Date: 11/06/2023 CLINICAL DATA:  Abdominal pain, emesis. No ostomy output. History of incarcerated hernia. EXAM: CT ABDOMEN AND PELVIS WITH  CONTRAST TECHNIQUE: Multidetector CT imaging of the abdomen and pelvis was performed using the standard protocol following bolus administration of intravenous contrast. RADIATION DOSE REDUCTION: This exam was performed according to the departmental dose-optimization program which includes automated exposure control, adjustment of the mA and/or kV according to patient size and/or use of iterative reconstruction technique. CONTRAST:  75mL OMNIPAQUE IOHEXOL 350 MG/ML SOLN COMPARISON:  CT abdomen and pelvis 12/01/2020 FINDINGS: Lower chest: No acute abnormality. Hepatobiliary: Unremarkable. Pancreas: Mild fatty atrophy without acute abnormality. Spleen: Unremarkable. Adrenals/Urinary Tract: Stable adrenal glands. No urinary calculi or hydronephrosis. Unremarkable bladder. Stomach/Bowel: Stomach is within normal limits. Hartmann's pouch. Colonic diverticulosis without diverticulitis. Normal appendix. Small bowel dilation with air-fluid levels and transition point at the entry into the left parastomal hernia (circa series 3/image 50). The small bowel within the hernia sac is also dilated. Questionable area of focal small bowel wall thickening versus peristalsis in the low abdomen (series 6/image 97 and series 3/image 72). Vascular/Lymphatic: No significant vascular findings are present. No enlarged abdominal or pelvic lymph nodes. Reproductive: Unremarkable. Other: Small volume free fluid in the hernia sac and pelvis. No free intraperitoneal air. Musculoskeletal: No acute fracture. IMPRESSION: 1. Small bowel obstruction with transition point at the entry into the left parastomal hernia. The small bowel within the hernia sac is also dilated. Surgical consult recommended. 2. Question focal enteritis with stricture in the low anterior abdomen versus peristalsis. 3. Small volume free fluid in the hernia sac and pelvis. Electronically Signed   By: Minerva Fester M.D.   On: 11/06/2023 02:41      Subjective: Patient seen  and examined at bedside.  Please likely go home today.  Tolerating diet.  Had bowel movement yesterday.  No fever or vomiting reported.  Discharge Exam: Vitals:   11/09/23 0424 11/09/23 0736  BP: 120/61 (!) 142/94  Pulse: 68 63  Resp: 18 18  Temp: 98.4 F (36.9 C) 98.5 F (36.9 C)  SpO2: 99% 96%    General: Pt is alert, awake, not in acute distress.  On room air. Cardiovascular: rate controlled, S1/S2 + Respiratory: bilateral decreased breath sounds at bases Abdominal: Soft, morbidly obese, mildly distended and tender, bowel sounds +, ostomy in place. Extremities: no edema, no cyanosis    The results of significant diagnostics from this hospitalization (including imaging, microbiology, ancillary and laboratory) are listed below for reference.     Microbiology: No results found for this or any previous visit (from the past 240 hour(s)).   Labs: BNP (last 3 results) No results for input(s): "BNP" in the last 8760 hours. Basic Metabolic Panel: Recent Labs  Lab 11/05/23 2317 11/06/23 0424 11/07/23 0723 11/08/23 0624 11/09/23 0533  NA 136 135 138 138 136  K 4.2 3.8 3.6 4.2 3.7  CL 100 100 102 99 99  CO2 26 26 29 29 29   GLUCOSE 116* 114*  113* 92 104*  BUN <5* <5* 7 7 5*  CREATININE 1.09 0.94 1.00 1.07 0.94  CALCIUM 9.4 8.8* 8.9 8.6* 8.7*  MG  --   --  2.2  --  2.0  PHOS  --   --  4.7*  --   --    Liver Function Tests: Recent Labs  Lab 11/05/23 2317 11/06/23 0424 11/07/23 0723  AST 19 16 15   ALT 17 16 14   ALKPHOS 61 56 52  BILITOT 1.0 0.9 0.9  PROT 6.9 6.5 6.4*  ALBUMIN 3.8 3.5 3.4*   Recent Labs  Lab 11/05/23 2317  LIPASE 23   No results for input(s): "AMMONIA" in the last 168 hours. CBC: Recent Labs  Lab 11/05/23 2317 11/06/23 0424 11/07/23 0723 11/07/23 1219 11/08/23 0624 11/09/23 0533  WBC 10.3 10.8* 8.6  --  5.5 6.6  HGB 14.1 13.3 13.9 14.4 12.8* 12.9*  HCT 45.5 42.3 43.1 44.9 39.8 40.2  MCV 87.2 85.8 86.0  --  86.5 85.9  PLT 273 257 245   --  220 244   Cardiac Enzymes: No results for input(s): "CKTOTAL", "CKMB", "CKMBINDEX", "TROPONINI" in the last 168 hours. BNP: Invalid input(s): "POCBNP" CBG: Recent Labs  Lab 11/08/23 1155 11/08/23 1653 11/08/23 2139 11/09/23 0004 11/09/23 0733  GLUCAP 131* 111* 92 128* 96   D-Dimer No results for input(s): "DDIMER" in the last 72 hours. Hgb A1c No results for input(s): "HGBA1C" in the last 72 hours. Lipid Profile No results for input(s): "CHOL", "HDL", "LDLCALC", "TRIG", "CHOLHDL", "LDLDIRECT" in the last 72 hours. Thyroid function studies No results for input(s): "TSH", "T4TOTAL", "T3FREE", "THYROIDAB" in the last 72 hours.  Invalid input(s): "FREET3" Anemia work up No results for input(s): "VITAMINB12", "FOLATE", "FERRITIN", "TIBC", "IRON", "RETICCTPCT" in the last 72 hours. Urinalysis    Component Value Date/Time   COLORURINE YELLOW 11/05/2023 2314   APPEARANCEUR HAZY (A) 11/05/2023 2314   APPEARANCEUR Hazy (A) 09/12/2020 1058   LABSPEC 1.019 11/05/2023 2314   PHURINE 6.0 11/05/2023 2314   GLUCOSEU NEGATIVE 11/05/2023 2314   HGBUR NEGATIVE 11/05/2023 2314   BILIRUBINUR NEGATIVE 11/05/2023 2314   BILIRUBINUR Negative 08/27/2020 1332   KETONESUR 20 (A) 11/05/2023 2314   PROTEINUR 100 (A) 11/05/2023 2314   UROBILINOGEN 0.2 05/04/2020 1400   NITRITE NEGATIVE 11/05/2023 2314   LEUKOCYTESUR NEGATIVE 11/05/2023 2314   Sepsis Labs Recent Labs  Lab 11/06/23 0424 11/07/23 0723 11/08/23 0624 11/09/23 0533  WBC 10.8* 8.6 5.5 6.6   Microbiology No results found for this or any previous visit (from the past 240 hour(s)).   Time coordinating discharge: 35 minutes  SIGNED:   Glade Lloyd, MD  [facility] 11/09/2023, 9:21 AM

## 2023-11-09 NOTE — Care Management Important Message (Signed)
Important Message  Patient Details  Name: [name] MRN: [id number] Date of Birth: 06-Oct-1981   Important Message Given:  Yes - Medicare IM     Sherilyn Banker 11/09/2023, 2:08 PM

## 2023-11-09 NOTE — Progress Notes (Signed)
   Subjective/Chief Complaint: Patient walking halls and tolerating diet.  Had bowel function yesterday.  No nausea or vomiting overnight.  Complains of mild discomfort from urostomy   Objective: Vital signs in last 24 hours: Temp:  [97.9 F (36.6 C)-98.6 F (37 C)] 98.5 F (36.9 C) (11/13 0736) Pulse Rate:  [57-78] 63 (11/13 0736) Resp:  [18] 18 (11/13 0736) BP: (120-144)/(61-94) 142/94 (11/13 0736) SpO2:  [96 %-99 %] 96 % (11/13 0736) Last BM Date : 11/03/23  Intake/Output from previous day: 11/12 0701 - 11/13 0700 In: 1920 [P.O.:1920] Out: 500 [Emesis/NG output:500] Intake/Output this shift: No intake/output data recorded.  Abdomen: Parastomal hernia noted and soft.  No rebound or guarding.  Ostomy present functioning  Lab Results:  Recent Labs    11/08/23 0624 11/09/23 0533  WBC 5.5 6.6  HGB 12.8* 12.9*  HCT 39.8 40.2  PLT 220 244   BMET Recent Labs    11/08/23 0624 11/09/23 0533  NA 138 136  K 4.2 3.7  CL 99 99  CO2 29 29  GLUCOSE 92 104*  BUN 7 5*  CREATININE 1.07 0.94  CALCIUM 8.6* 8.7*   PT/INR No results for input(s): "LABPROT", "INR" in the last 72 hours. ABG No results for input(s): "PHART", "HCO3" in the last 72 hours.  Invalid input(s): "PCO2", "PO2"  Studies/Results: DG Abd 1 View  Result Date: 11/08/2023 CLINICAL DATA:  Small-bowel obstruction EXAM: ABDOMEN - 1 VIEW COMPARISON:  11/06/2023 x-ray and older FINDINGS: There is contrast along the nondilated colon diffusely. Several dilated loops of bowel in the midabdomen. Enteric tube with tip along the right side of the midabdomen. This could be in the duodenal. No obvious free air on this supine radiograph. IMPRESSION: Persistent dilated loops of bowel in the midabdomen, likely small bowel. The colon is decompressed but has luminal contrast from previous exam. Enteric tube in place.  This could be in the duodenal. Electronically Signed   By: Karen Kays M.D.   On: 11/08/2023 10:54     Anti-infectives: Anti-infectives (From admission, onward)    None       Assessment/Plan: Partial small bowel obstruction secondary to parastomal hernia overall improved  Advance diet  Continue ambulation  Can be discharged he tolerates his diet.     LOS: 3 days    Jordan Fus A Rola [name] 11/09/2023 Moderate COMPLEXITY

## 2023-11-09 NOTE — Progress Notes (Signed)
Discharge instructions given to pt. Pt verbalized understanding of teaching and had no further questions. Abdominal binder and ostomy pouch strap given to pt prior to DC.

## 2024-01-24 ENCOUNTER — Telehealth: Payer: Self-pay

## 2024-01-24 NOTE — Telephone Encounter (Signed)
Copied from CRM [id number]. Topic: General - Other >> Jan 24, 2024  3:50 PM Marlow Baars wrote: Reason for CRM: The spouse of the patient, Efraim Kaufmann is calling back stating she spoke with [employer] and they told her they received Verbal Orders from the provider to continue with Ostomy supplies but there is no documentation stating that and she states he needs the supplies ASAP!  She just wants to make sure the patient is taken care of and gets what he needs.

## 2024-01-25 NOTE — Telephone Encounter (Signed)
I have filled out the order form that was sent to me and it is ready to be faxed back.  I do not know if they require a progress note as well but I have only seen him 1 time so there is only 1 note to use regarding his ostomy.

## 2024-01-25 NOTE — Telephone Encounter (Signed)
Faxed 01/25/2024

## 2024-01-28 DIAGNOSIS — K631 Perforation of intestine (nontraumatic): Secondary | ICD-10-CM | POA: Diagnosis not present

## 2024-01-28 DIAGNOSIS — Z933 Colostomy status: Secondary | ICD-10-CM | POA: Diagnosis not present

## 2024-02-02 ENCOUNTER — Encounter: Payer: Self-pay | Admitting: Family Medicine

## 2024-02-06 ENCOUNTER — Ambulatory Visit (INDEPENDENT_AMBULATORY_CARE_PROVIDER_SITE_OTHER): Payer: Medicare HMO | Admitting: Family Medicine

## 2024-02-06 ENCOUNTER — Encounter: Payer: Self-pay | Admitting: Family Medicine

## 2024-02-06 VITALS — BP 142/91 | HR 69 | Ht 66.0 in | Wt 288.0 lb

## 2024-02-06 DIAGNOSIS — Z933 Colostomy status: Secondary | ICD-10-CM | POA: Diagnosis not present

## 2024-02-06 DIAGNOSIS — Z8719 Personal history of other diseases of the digestive system: Secondary | ICD-10-CM

## 2024-02-06 DIAGNOSIS — M12811 Other specific arthropathies, not elsewhere classified, right shoulder: Secondary | ICD-10-CM | POA: Diagnosis not present

## 2024-02-06 NOTE — Progress Notes (Signed)
   Established Patient Office Visit  Subjective   Patient ID: Jordan [name], male    DOB: 1981-07-26  Age: 43 y.o. MRN: [id number]  Chief Complaint  Patient presents with   Medical Management of Chronic Issues    HPI Patient was able to get his colostomy supplies.  Patient never seen a gastroenterologist about his ostomy.  He has only followed up with the surgeon who performed surgery in 2021.  Patient still trying to lose weight so that he can get his ostomy reversed.  States that his recent hospitalization for SBO set him back in terms of weight loss.  Patient states that his SBO was triggered by leafy greens so he is hesitant to eat anything similar to that.  Patient complaining of right shoulder pain.  Occurred right after our last visit.  He was raking and felt a "pulling sensation or tearing" in his right shoulder.  Now has pain that waxes and wanes and is worsened by use.  Has been doing some home exercises along with topical conservative management.    The 10-year ASCVD risk score (Arnett DK, et al., 2019) is: 1.6%  Health Maintenance Due  Topic Date Due   Pneumococcal Vaccine 10-52 Years old (1 of 2 - PCV) Never done   INFLUENZA VACCINE  07/28/2023   COVID-19 Vaccine (1 - 2024-25 season) Never done      Objective:     BP (!) 142/91   Pulse 69   Ht 5\' 6"  (1.676 m)   Wt 288 lb (130.6 kg)   SpO2 98%   BMI 46.48 kg/m    Physical Exam General: Alert, oriented CV: Regular rhythm Pulmonary: Lungs clear bilaterally GI: Colostomy present.    Assessment & Plan:   Colostomy present [facility]) Assessment & Plan: Surgery for complicated diverticulitis with colovesicular fistula in 2021.  Has had a colostomy ever since that time.  Patient desires eventual reversal.  Has been told he needs to decrease BMI before he can safely have surgery to reverse it.  Patient asked about referral to GI.  He has never seen a gastroenterologist for his diverticulosis or  colostomy.  Will send in referral.  Will get CMP and CBC today.  Orders: -     Comprehensive metabolic panel -     CBC -     Ambulatory referral to Gastroenterology  Rotator cuff arthropathy of right shoulder Assessment & Plan: A few months of right shoulder pain that worsens with use, acutely started after injuring it while raking leaves.  On exam he had a positive Hawkins and empty can test.  Tenderness over the deltoid.  Would favor tendinopathy over tear.  Still has good active range of motion.  Will refer to physical therapy.  If patient cannot do physical therapy, advised him we can refer him to sports medicine clinic where they may be able to do further evaluation with an ultrasound.  Encouraged continued conservative management and showed patient some basic exercises he can do at home while he awaits physical therapy.  Orders: -     Ambulatory referral to Physical Therapy  History of diverticulitis of colon -     Ambulatory referral to Gastroenterology     Return in about 6 weeks (around 03/19/2024).    Sandre Kitty, MD

## 2024-02-06 NOTE — Patient Instructions (Signed)
 It was nice to see you today,  We addressed the following topics today: -I am sending a referral to the gastroenterologist and also to the physical therapist - For your shoulder the best thing to do would be to go to physical therapy.  When they call you to schedule you should ask them what the cost for you would be in terms of co-pays.  If this is not doable let me know and I can send you to a sports medicine doctor.  They can also go over exercises with you and perhaps do an ultrasound of your shoulder - Continue conservative treatment that you are already doing for your shoulder - I am ordering some labs and will follow-up with you on the results when I get them   Have a great day,  Etha Henle, MD

## 2024-02-07 ENCOUNTER — Encounter: Payer: Self-pay | Admitting: Family Medicine

## 2024-02-07 DIAGNOSIS — M12811 Other specific arthropathies, not elsewhere classified, right shoulder: Secondary | ICD-10-CM | POA: Insufficient documentation

## 2024-02-07 LAB — COMPREHENSIVE METABOLIC PANEL
ALT: 13 [IU]/L (ref 0–44)
AST: 17 [IU]/L (ref 0–40)
Albumin: 4.1 g/dL (ref 4.1–5.1)
Alkaline Phosphatase: 76 [IU]/L (ref 44–121)
BUN/Creatinine Ratio: 10 (ref 9–20)
BUN: 9 mg/dL (ref 6–24)
Bilirubin Total: 0.3 mg/dL (ref 0.0–1.2)
CO2: 24 mmol/L (ref 20–29)
Calcium: 9.1 mg/dL (ref 8.7–10.2)
Chloride: 103 mmol/L (ref 96–106)
Creatinine, Ser: 0.9 mg/dL (ref 0.76–1.27)
Globulin, Total: 2.4 g/dL (ref 1.5–4.5)
Glucose: 88 mg/dL (ref 70–99)
Potassium: 4.5 mmol/L (ref 3.5–5.2)
Sodium: 140 mmol/L (ref 134–144)
Total Protein: 6.5 g/dL (ref 6.0–8.5)
eGFR: 108 mL/min/{1.73_m2} (ref >59)

## 2024-02-07 LAB — CBC
Hematocrit: 44 % (ref 37.5–51.0)
Hemoglobin: 14 g/dL (ref 13.0–17.7)
MCH: 26.7 pg (ref 26.6–33.0)
MCHC: 31.8 g/dL (ref 31.5–35.7)
MCV: 84 fL (ref 79–97)
Platelets: 273 10*3/uL (ref 150–450)
RBC: 5.24 x10E6/uL (ref 4.14–5.80)
RDW: 13.1 % (ref 11.6–15.4)
WBC: 5.8 10*3/uL (ref 3.4–10.8)

## 2024-02-07 NOTE — Assessment & Plan Note (Signed)
Surgery for complicated diverticulitis with colovesicular fistula in 2021.  Has had a colostomy ever since that time.  Patient desires eventual reversal.  Has been told he needs to decrease BMI before he can safely have surgery to reverse it.  Patient asked about referral to GI.  He has never seen a gastroenterologist for his diverticulosis or colostomy.  Will send in referral.  Will get CMP and CBC today.

## 2024-02-07 NOTE — Assessment & Plan Note (Signed)
A few months of right shoulder pain that worsens with use, acutely started after injuring it while raking leaves.  On exam he had a positive Hawkins and empty can test.  Tenderness over the deltoid.  Would favor tendinopathy over tear.  Still has good active range of motion.  Will refer to physical therapy.  If patient cannot do physical therapy, advised him we can refer him to sports medicine clinic where they may be able to do further evaluation with an ultrasound.  Encouraged continued conservative management and showed patient some basic exercises he can do at home while he awaits physical therapy.

## 2024-02-15 ENCOUNTER — Telehealth: Payer: Medicare HMO | Admitting: Physician Assistant

## 2024-02-15 DIAGNOSIS — R6889 Other general symptoms and signs: Secondary | ICD-10-CM | POA: Diagnosis not present

## 2024-02-15 MED ORDER — PROMETHAZINE-DM 6.25-15 MG/5ML PO SYRP
5.0000 mL | ORAL_SOLUTION | Freq: Four times a day (QID) | ORAL | 0 refills | Status: DC | PRN
Start: 2024-02-15 — End: 2024-06-26

## 2024-02-15 MED ORDER — OSELTAMIVIR PHOSPHATE 75 MG PO CAPS: 75.0000 mg | ORAL_CAPSULE | Freq: Two times a day (BID) | ORAL | 0 refills | Status: DC

## 2024-02-15 NOTE — Patient Instructions (Signed)
Jordan [name], thank you for joining Margaretann Loveless, PA-C for today's virtual visit.  While this provider is not your primary care provider (PCP), if your PCP is located in our provider database this encounter information will be shared with them immediately following your visit.   A [facility] MyChart account gives you access to today's visit and all your visits, tests, and labs performed at [facility] " click here if you don't have a [facility] MyChart account or go to [email or web address]  Consent: (Patient) Jordan [name] provided verbal consent for this virtual visit at the beginning of the encounter.  Current Medications:  Current Outpatient Medications:    oseltamivir (TAMIFLU) 75 MG capsule, Take 1 capsule (75 mg total) by mouth 2 (two) times daily., Disp: 10 capsule, Rfl: 0   promethazine-dextromethorphan (PROMETHAZINE-DM) 6.25-15 MG/5ML syrup, Take 5 mLs by mouth 4 (four) times daily as needed., Disp: 118 mL, Rfl: 0   albuterol (VENTOLIN HFA) 108 (90 Base) MCG/ACT inhaler, Inhale 2 puffs into the lungs every 6 (six) hours as needed for wheezing or shortness of breath., Disp: 18 g, Rfl: 2   aspirin 81 MG tablet, Take 81 mg by mouth daily. , Disp: , Rfl:    Blood Pressure Monitor DEVI, Check bp daily in the am, Disp: 1 each, Rfl: 0   cetirizine (ZYRTEC) 10 MG tablet, Take 1 tablet (10 mg total) by mouth daily., Disp: 90 tablet, Rfl: 1   polyethylene glycol (MIRALAX / GLYCOLAX) 17 g packet, Take 17 g by mouth daily., Disp: 14 each, Rfl: 0   Medications ordered in this encounter:  Meds ordered this encounter  Medications   oseltamivir (TAMIFLU) 75 MG capsule    Sig: Take 1 capsule (75 mg total) by mouth 2 (two) times daily.    Dispense:  10 capsule    Refill:  0    Supervising Provider:   Merrilee Jansky [[id number]]   promethazine-dextromethorphan (PROMETHAZINE-DM) 6.25-15 MG/5ML syrup    Sig: Take 5 mLs by mouth 4 (four)  times daily as needed.    Dispense:  118 mL    Refill:  0    Supervising Provider:   Merrilee Jansky [[id number]]     *If you need refills on other medications prior to your next appointment, please contact your pharmacy*  Follow-Up: Call back or seek an in-person evaluation if the symptoms worsen or if the condition fails to improve as anticipated.  [facility] [phone]  Other Instructions  Influenza, Adult Influenza is also called the flu. It's an infection that affects your respiratory tract. This includes your nose, throat, windpipe, and lungs. The flu is contagious. This means it spreads easily from person to person. It causes symptoms that are like a cold. It can also cause a high fever and body aches. What are the causes? The flu is caused by the influenza virus. You can get it by: Breathing in droplets that are in the air after an infected person coughs or sneezes. Touching something that has the virus on it and then touching your mouth, nose, or eyes. What increases the risk? You may be more likely to get the flu if: You don't wash your hands often. You're near a lot of people during cold and flu season. You touch your mouth, eyes, or nose without washing your hands first. You don't get a flu shot each year. You may also be more at risk for the flu and serious problems,  such as a lung infection called pneumonia, if: You're older than 65. You're pregnant. Your immune system is weak. Your immune system is your body's defense system. You have a long-term, or chronic, condition, such as: Heart, kidney, or lung disease. Diabetes. A liver disorder. Asthma. You're very overweight. You have anemia. This is when you don't have enough red blood cells in your body. What are the signs or symptoms? Flu symptoms often start all of a sudden. They may last 4-14 days and include: Fever and chills. Headaches, body aches, or muscle aches. Sore throat. Cough. Runny  or stuffy nose. Discomfort in your chest. Not wanting to eat as much as normal. Feeling weak or tired. Feeling dizzy. Nausea or vomiting. How is this diagnosed? The flu may be diagnosed based on your symptoms and medical history. You may also have a physical exam. A swab may be taken from your nose or throat and tested for the virus. How is this treated? If the flu is found early, you can be treated with antiviral medicine. This may be given to you by mouth or through an IV. It can help you feel less sick and get better faster. Taking care of yourself at home can also help your symptoms get better. Your health care provider may tell you to: Take over-the-counter medicines. Drink lots of fluids. The flu often goes away on its own. If you have very bad symptoms or problems caused by the flu, you may need to be treated in a hospital. Follow these instructions at home: Activity Rest as needed. Get lots of sleep. Stay home from work or school as told by your provider. Leave home only to go see your provider. Do not leave home for other reasons until you don't have a fever for 24 hours without taking medicine. Eating and drinking Take an oral rehydration solution (ORS). This is a drink that is sold at pharmacies and stores. Drink enough fluid to keep your pee pale yellow. Try to drink small amounts of clear fluids. These include water, ice chips, fruit juice mixed with water, and low-calorie sports drinks. Try to eat bland foods that are easy to digest. These include bananas, applesauce, rice, lean meats, toast, and crackers. Avoid drinks that have a lot of sugar or caffeine in them. These include energy drinks, regular sports drinks, and soda. Do not drink alcohol. Do not eat spicy or fatty foods. General instructions     Take your medicines only as told by your provider. Use a cool mist humidifier to add moisture to the air in your home. This can make it easier for you to breathe. You  should also clean the humidifier every day. To do so: Empty the water. Pour clean water in. Cover your mouth and nose when you cough or sneeze. Wash your hands with soap and water often and for at least 20 seconds. It's extra important to do so after you cough or sneeze. If you can't use soap and water, use hand sanitizer. How is this prevented?  Get a flu shot every year. Ask your provider when you should get your flu shot. Stay away from people who are sick during fall and winter. Fall and winter are cold and flu season. Contact a health care provider if: You get new symptoms. You have chest pain. You have watery poop, also called diarrhea. You have a fever. Your cough gets worse. You start to have more mucus. You feel like you may vomit, or you vomit. Get  help right away if: You become short of breath or have trouble breathing. Your skin or nails turn blue. You have very bad pain or stiffness in your neck. You get a sudden headache or pain in your face or ear. You vomit each time you eat or drink. These symptoms may be an emergency. Call 911 right away. Do not wait to see if the symptoms will go away. Do not drive yourself to the hospital. This information is not intended to replace advice given to you by your health care provider. Make sure you discuss any questions you have with your health care provider. Document Revised: 09/15/2023 Document Reviewed: 01/20/2023 Elsevier Patient Education  2024 Elsevier Inc.   If you have been instructed to have an in-person evaluation today at a local Urgent Care facility, please use the link below. It will take you to a list of all of our available [facility], including address, phone number and hours of operation. Please do not delay care.  [facility]  If you or a family member do not have a primary care provider, use the link below to schedule a visit and establish care. When you choose a [facility] primary  care physician or advanced practice provider, you gain a long-term partner in health. Find a Primary Care Provider  Learn more about [facility]'s in-office and virtual care options: [facility] [facility] - Get Care Now

## 2024-02-15 NOTE — Progress Notes (Signed)
Virtual Visit Consent   Jordan [name], you are scheduled for a virtual visit with a [facility] provider today. Just as with appointments in the office, your consent must be obtained to participate. Your consent will be active for this visit and any virtual visit you may have with one of our providers in the next 365 days. If you have a MyChart account, a copy of this consent can be sent to you electronically.  As this is a virtual visit, video technology does not allow for your provider to perform a traditional examination. This may limit your provider's ability to fully assess your condition. If your provider identifies any concerns that need to be evaluated in person or the need to arrange testing (such as labs, EKG, etc.), we will make arrangements to do so. Although advances in technology are sophisticated, we cannot ensure that it will always work on either your end or our end. If the connection with a video visit is poor, the visit may have to be switched to a telephone visit. With either a video or telephone visit, we are not always able to ensure that we have a secure connection.  By engaging in this virtual visit, you consent to the provision of healthcare and authorize for your insurance to be billed (if applicable) for the services provided during this visit. Depending on your insurance coverage, you may receive a charge related to this service.  I need to obtain your verbal consent now. Are you willing to proceed with your visit today? Jordan [name] has provided verbal consent on 02/15/2024 for a virtual visit (video or telephone). Margaretann Loveless, PA-C  Date: 02/15/2024 3:19 PM   Virtual Visit via Video Note   I, Margaretann Loveless, connected with  Jordan [name]  ([id number], 05/29/1981) on 02/15/24 at  3:15 PM EST by a video-enabled telemedicine application and verified that I am speaking with the correct person using two  identifiers.  Location: Patient: Virtual Visit Location Patient: Home Provider: Virtual Visit Location Provider: Home Office   I discussed the limitations of evaluation and management by telemedicine and the availability of in person appointments. The patient expressed understanding and agreed to proceed.    History of Present Illness: Jordan [name] is a 43 y.o. who identifies as a male who was assigned male at birth, and is being seen today for flu-like symptoms.  HPI: URI  This is a new problem. The current episode started yesterday. The problem has been gradually worsening. The maximum temperature recorded prior to his arrival was 103 - 104 F (103-101). The fever has been present for 1 to 2 days. Associated symptoms include chest pain (fatigue), congestion, coughing (barking cough), headaches, nausea, a plugged ear sensation, rhinorrhea (and post nasal drainage), sinus pain, a sore throat (mild) and wheezing. Pertinent negatives include no diarrhea, ear pain or vomiting. Associated symptoms comments: Myalgias, body aches. Treatments tried: Dayquil, Nyquil. The treatment provided no relief.    Son was sick over the weekend with similar symptoms.   Problems:  Patient Active Problem List   Diagnosis Date Noted   Rotator cuff arthropathy of right shoulder 02/07/2024   Small bowel obstruction (HCC) 11/06/2023   SBO (small bowel obstruction) (HCC) 11/06/2023   Colostomy present (HCC) 11/28/2020   Status post colostomy (HCC) 11/04/2020   History of diverticulitis of colon 09/09/2020   Reactive airway disease 10/01/2016   Class 3 severe obesity with serious comorbidity and body mass  index (BMI) of 45.0 to 49.9 in adult [facility]) 02/06/2015   OSA (obstructive sleep apnea) 10/24/2014   Anxiety and depression 01/21/2010   CHRONIC PAIN SYNDROME 01/21/2010   Essential hypertension 01/21/2010   Osteoarthritis 01/21/2010    Allergies:  Allergies  Allergen Reactions   Butrans  [Buprenorphine Hcl]     Difficulty breathing, n/v   Nucynta [Tapentadol Hydrochloride] Anaphylaxis   Glucose Polymer Other (See Comments)    poison   [place]     poison   Bactrim [Sulfamethoxazole-Trimethoprim] Itching and Rash   Medications:  Current Outpatient Medications:    oseltamivir (TAMIFLU) 75 MG capsule, Take 1 capsule (75 mg total) by mouth 2 (two) times daily., Disp: 10 capsule, Rfl: 0   promethazine-dextromethorphan (PROMETHAZINE-DM) 6.25-15 MG/5ML syrup, Take 5 mLs by mouth 4 (four) times daily as needed., Disp: 118 mL, Rfl: 0   albuterol (VENTOLIN HFA) 108 (90 Base) MCG/ACT inhaler, Inhale 2 puffs into the lungs every 6 (six) hours as needed for wheezing or shortness of breath., Disp: 18 g, Rfl: 2   aspirin 81 MG tablet, Take 81 mg by mouth daily. , Disp: , Rfl:    Blood Pressure Monitor DEVI, Check bp daily in the am, Disp: 1 each, Rfl: 0   cetirizine (ZYRTEC) 10 MG tablet, Take 1 tablet (10 mg total) by mouth daily., Disp: 90 tablet, Rfl: 1   polyethylene glycol (MIRALAX / GLYCOLAX) 17 g packet, Take 17 g by mouth daily., Disp: 14 each, Rfl: 0  Observations/Objective: Patient is well-developed, well-nourished in no acute distress.  Resting comfortably at home.  Head is normocephalic, atraumatic.  No labored breathing.  Speech is clear and coherent with logical content.  Patient is alert and oriented at baseline.    Assessment and Plan: 1. Flu-like symptoms (Primary) - oseltamivir (TAMIFLU) 75 MG capsule; Take 1 capsule (75 mg total) by mouth 2 (two) times daily.  Dispense: 10 capsule; Refill: 0 - promethazine-dextromethorphan (PROMETHAZINE-DM) 6.25-15 MG/5ML syrup; Take 5 mLs by mouth 4 (four) times daily as needed.  Dispense: 118 mL; Refill: 0  - Suspect influenza due to symptoms and positive exposure - Tamiflu prescribed - Promethazine DM for cough - Continue OTC medication of choice for symptomatic management - Continue Albuterol inhaler already on hand - Start  Flonase nasal spray already on hand - Push fluids - Rest - Seek in person evaluation if symptoms worsen or fail to improve   Follow Up Instructions: I discussed the assessment and treatment plan with the patient. The patient was provided an opportunity to ask questions and all were answered. The patient agreed with the plan and demonstrated an understanding of the instructions.  A copy of instructions were sent to the patient via MyChart unless otherwise noted below.    The patient was advised to call back or seek an in-person evaluation if the symptoms worsen or if the condition fails to improve as anticipated.    Margaretann Loveless, PA-C

## 2024-03-20 NOTE — Progress Notes (Deleted)
   Established Patient Office Visit  Subjective   Patient ID: Jordan [name], male    DOB: August 05, 1981  Age: 43 y.o. MRN: [id number]  No chief complaint on file.   HPI  Shoulder pain, did he get therapy?   Did he get GI?    The 10-year ASCVD risk score (Arnett DK, et al., 2019) is: 1.6%  Health Maintenance Due  Topic Date Due   Pneumococcal Vaccine 62-69 Years old (1 of 2 - PCV) Never done   INFLUENZA VACCINE  07/28/2023   COVID-19 Vaccine (1 - 2024-25 season) Never done      Objective:     There were no vitals taken for this visit. {Vitals History (Optional):23777}  Physical Exam   No results found for any visits on 03/21/24.      Assessment & Plan:   There are no diagnoses linked to this encounter.   No follow-ups on file.    Sandre Kitty, MD

## 2024-03-21 ENCOUNTER — Ambulatory Visit: Payer: Medicare HMO | Admitting: Family Medicine

## 2024-05-25 ENCOUNTER — Ambulatory Visit: Attending: Physician Assistant

## 2024-05-25 ENCOUNTER — Ambulatory Visit
Admission: EM | Admit: 2024-05-25 | Discharge: 2024-05-25 | Disposition: A | Discharge status: Home / Self Care | Payer: Worker's Compensation | Attending: Physician Assistant | Admitting: Physician Assistant

## 2024-05-25 ENCOUNTER — Encounter: Payer: Self-pay | Admitting: Emergency Medicine

## 2024-05-25 DIAGNOSIS — S8991XA Unspecified injury of right lower leg, initial encounter: Secondary | ICD-10-CM | POA: Diagnosis not present

## 2024-05-25 DIAGNOSIS — Z96651 Presence of right artificial knee joint: Secondary | ICD-10-CM | POA: Diagnosis not present

## 2024-05-25 DIAGNOSIS — M25561 Pain in right knee: Secondary | ICD-10-CM

## 2024-05-25 MED ORDER — PREDNISONE 10 MG PO TABS: 40.0000 mg | ORAL_TABLET | Freq: Every day | ORAL | 0 refills | Status: AC

## 2024-05-25 MED ORDER — CYCLOBENZAPRINE HCL 10 MG PO TABS: 10.0000 mg | ORAL_TABLET | Freq: Two times a day (BID) | ORAL | 0 refills | Status: DC | PRN

## 2024-05-25 NOTE — Discharge Instructions (Addendum)
 Take prednisone  as prescribed Can take Tylenol  as needed.  While taking prednisone  do not NSAIDS like Ibuprofen .  Reach out to case manager on Monday

## 2024-05-25 NOTE — ED Provider Notes (Signed)
 [facility]    CSN: [id number] Arrival date & time: 05/25/24  1718      History   Chief Complaint Chief Complaint  Patient presents with   Knee Pain    HPI Jordan [name] is a 43 y.o. male.   Patient presents with worsening right knee pain.  He has a history of several right knee surgeries with most recent total knee replacement in 2010.  He reports about 2 weeks ago he was walking on the stairs without his knee brace and felt a pop and had immediate onset of pain.  He reports since then he has been having continued pain.  Pain is worse with going up and down stairs and prolonged standing and driving.  He reports he has an upcoming visit with his orthopedist but it is not until October.  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 injury.  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  He is taking ibuprofen  with minimal improvement and applying Voltaren  gel with minimal improvement.    Past Medical History:  Diagnosis Date   Anxiety and depression    Asthma    Chronic pain syndrome    GERD (gastroesophageal reflux disease)    History of kidney stones    HYPERTENSION, MILD    OSTEOARTHRITIS    Recurrent UTI    Sleep apnea     Patient Active Problem List   Diagnosis Date Noted   Rotator cuff arthropathy of right shoulder 02/07/2024   Small bowel obstruction (HCC) 11/06/2023   SBO (small bowel obstruction) (HCC) 11/06/2023   Colostomy present (HCC) 11/28/2020   Status post colostomy (HCC) 11/04/2020   History of diverticulitis of colon 09/09/2020   Reactive airway disease 10/01/2016   Class 3 severe obesity with serious comorbidity and body mass index (BMI) of 45.0 to 49.9 in adult 02/06/2015   OSA (obstructive sleep apnea) 10/24/2014   Anxiety and depression 01/21/2010   CHRONIC PAIN SYNDROME 01/21/2010   Essential hypertension 01/21/2010   Osteoarthritis 01/21/2010    Past Surgical History:  Procedure Laterality Date   COLON SURGERY     COLONOSCOPY WITH  PROPOFOL  N/A 09/30/2020   Procedure: COLONOSCOPY WITH PROPOFOL ;  Surgeon: Marnee Sink, MD;  Location: [facility];  Service: Endoscopy;  Laterality: N/A;   JOINT REPLACEMENT     KNEE SURGERY Right    7 total surgeries   LYSIS OF ADHESION  10/15/2020   Procedure: LYSIS OF ADHESION;  Surgeon: Flynn Hylan, MD;  Location: [facility] OR[facility];  Service: General;;   REPLACEMENT TOTAL KNEE Right    XI ROBOT ASSISTED DIAGNOSTIC LAPAROSCOPY N/A 10/15/2020   Procedure: XI ROBOT ASSISTED DIAGNOSTIC LAPAROSCOPY, HARTMAN'S;  Surgeon: Flynn Hylan, MD;  Location: [facility] ORS;  Service: General;  Laterality: N/A;       Home Medications    Prior to Admission medications   Medication Sig Start Date End Date Taking? Authorizing Provider  albuterol  (VENTOLIN  HFA) 108 (90 Base) MCG/ACT inhaler Inhale 2 puffs into the lungs every 6 (six) hours as needed for wheezing or shortness of breath. 04/28/23  Yes Maryclare Smoke A, PA  aspirin 81 MG tablet Take 81 mg by mouth daily.    Yes [provider]  Blood Pressure Monitor DEVI Check bp daily in the am 08/04/23  Yes Laneta Pintos, MD  cetirizine  (ZYRTEC ) 10 MG tablet Take 1 tablet (10 mg total) by mouth daily. 03/23/22  Yes Abonza, Maritza, PA-C  cyclobenzaprine  (FLEXERIL ) 10 MG tablet Take 1 tablet (10 mg  total) by mouth 2 (two) times daily as needed for muscle spasms. 05/25/24  Yes Ward, Char Common, PA-C  diclofenac  Sodium (VOLTAREN ) 1 % GEL Apply 2 g topically as needed.   Yes [provider]  polyethylene glycol (MIRALAX  / GLYCOLAX ) 17 g packet Take 17 g by mouth daily. 11/10/23  Yes Audria Leather, MD  predniSONE  (DELTASONE ) 10 MG tablet Take 4 tablets (40 mg total) by mouth daily for 5 days. 05/25/24 05/30/24 Yes Ward, Char Common, PA-C  oseltamivir  (TAMIFLU ) 75 MG capsule Take 1 capsule (75 mg total) by mouth 2 (two) times daily. Patient not taking: Reported on 05/25/2024 02/15/24   Burnette, Jennifer M, PA-C  promethazine -dextromethorphan   (PROMETHAZINE -DM) 6.25-15 MG/5ML syrup Take 5 mLs by mouth 4 (four) times daily as needed. Patient not taking: Reported on 05/25/2024 02/15/24   Angelia Kelp, PA-C    Family History Family History  Problem Relation Age of Onset   Depression Mother    Dementia Mother    Heart disease Father    Heart attack Father    Diabetes Father    High Cholesterol Father    High blood pressure Father    Heart disease Paternal Grandmother    Heart attack Paternal Grandmother    Diabetes Paternal Grandmother    Heart disease Paternal Grandfather    High Cholesterol Paternal Grandfather    Cancer Maternal Aunt        lung   Hypertension Maternal Uncle    Cancer Maternal Uncle        lung   Diabetes Paternal Aunt    Heart disease Paternal Uncle    Cancer Paternal Uncle    Heart attack Paternal Uncle    Diabetes Paternal Uncle    High Cholesterol Paternal Uncle     Social History Social History   Tobacco Use   Smoking status: Former    Types: Cigars    Quit date: 01/18/2014    Years since quitting: 10.3    Passive exposure: Never   Smokeless tobacco: Never  Vaping Use   Vaping status: Never Used  Substance Use Topics   Alcohol use: Yes    Alcohol/week: 13.0 standard drinks of alcohol    Types: 7 Standard drinks or equivalent, 6 Cans of beer per week   Drug use: Yes    Frequency: 7.0 times per week    Types: Marijuana    Comment: 09/28/20      Allergies   Butrans [buprenorphine hcl], Nucynta [tapentadol hydrochloride], Glucose polymer, Sumac, and Bactrim  [sulfamethoxazole -trimethoprim ]   Review of Systems Review of Systems  Constitutional:  Negative for chills and fever.  HENT:  Negative for ear pain and sore throat.   Eyes:  Negative for pain and visual disturbance.  Respiratory:  Negative for cough and shortness of breath.   Cardiovascular:  Negative for chest pain and palpitations.  Gastrointestinal:  Negative for abdominal pain and vomiting.  Genitourinary:   Negative for dysuria and hematuria.  Musculoskeletal:  Positive for arthralgias. Negative for back pain.  Skin:  Negative for color change and rash.  Neurological:  Negative for seizures and syncope.  All other systems reviewed and are negative.    Physical Exam Triage Vital Signs ED Triage Vitals  Encounter Vitals Group     BP 05/25/24 1749 137/87     Systolic BP Percentile --      Diastolic BP Percentile --      Pulse Rate 05/25/24 1749 91     Resp 05/25/24 1749  18     Temp 05/25/24 1749 98.6 F (37 C)     Temp Source 05/25/24 1749 Oral     SpO2 05/25/24 1749 98 %     Weight --      Height --      Head Circumference --      Peak Flow --      Pain Score 05/25/24 1750 6     Pain Loc --      Pain Education --      Exclude from Growth Chart --    No data found.  Updated Vital Signs BP 137/87 (BP Location: Left Arm)   Pulse 91   Temp 98.6 F (37 C) (Oral)   Resp 18   SpO2 98%   Visual Acuity Right Eye Distance:   Left Eye Distance:   Bilateral Distance:    Right Eye Near:   Left Eye Near:    Bilateral Near:     Physical Exam Vitals and nursing note reviewed.  Constitutional:      General: He is not in acute distress.    Appearance: He is well-developed.  HENT:     Head: Normocephalic and atraumatic.  Eyes:     Conjunctiva/sclera: Conjunctivae normal.  Cardiovascular:     Rate and Rhythm: Normal rate and regular rhythm.     Heart sounds: No murmur heard. Pulmonary:     Effort: Pulmonary effort is normal. No respiratory distress.     Breath sounds: Normal breath sounds.  Abdominal:     Palpations: Abdomen is soft.     Tenderness: There is no abdominal tenderness.  Musculoskeletal:        General: No swelling.     Cervical back: Neck supple.     Left knee: Bony tenderness present.  Skin:    General: Skin is warm and dry.     Capillary Refill: Capillary refill takes less than 2 seconds.  Neurological:     Mental Status: He is alert.  Psychiatric:         Mood and Affect: Mood normal.      UC Treatments / Results  Labs (all labs ordered are listed, but only abnormal results are displayed) Labs Reviewed - No data to display  EKG   Radiology No results found.  Procedures Procedures (including critical care time)  Medications Ordered in UC Medications - No data to display  Initial Impression / Assessment and Plan / UC Course  I have reviewed the triage vital signs and the nursing notes.  Pertinent labs & imaging results that were available during my care of the patient were reviewed by me and considered in my medical decision making (see chart for details).     Advised patient to continue wearing brace.  Will provide short burst of prednisone  and Flexeril .  Advised patient to contact her Worker's Comp. Sports coach on Monday for further recommendations given orthopedics cannot see him until October.  Discussed [facility] urgent care as well. Final Clinical Impressions(s) / UC Diagnoses   Final diagnoses:  Acute pain of right knee     Discharge Instructions      Take prednisone  as prescribed Can take Tylenol  as needed.  While taking prednisone  do not NSAIDS like Ibuprofen .  Reach out to case manager on Monday   ED Prescriptions     Medication Sig Dispense Auth. Provider   predniSONE  (DELTASONE ) 10 MG tablet Take 4 tablets (40 mg total) by mouth daily for 5 days. 20 tablet Ward,  Eldor Conaway Z, PA-C   cyclobenzaprine  (FLEXERIL ) 10 MG tablet Take 1 tablet (10 mg total) by mouth 2 (two) times daily as needed for muscle spasms. 20 tablet Ward, Eastin Swing Z, PA-C      PDMP not reviewed this encounter.   Ward, Char Common, PA-C 05/25/24 1904

## 2024-05-25 NOTE — ED Triage Notes (Signed)
 Pt reports worsening R knee pain. Pt reports total knee replacement in 2010. 6 other R knee surgeries prior to that. Had some level of pain ever since then, but states ~2 weeks ago pain has gotten significantly worse and knee is giving out on him more often. Ice, voltaren  gel, and ibuprofen  give some relief temporarily. Reports intermittent sharp, stabbing pain with constant dull pain. Aggravated with driving, going up/down stairs, and prolonged standing. Pt is scheduled to see his knee surgeon in 10/19/25 (soonest date available)

## 2024-05-31 ENCOUNTER — Ambulatory Visit: Payer: Medicare HMO

## 2024-05-31 DIAGNOSIS — Z0000 Encounter for general adult medical examination without abnormal findings: Secondary | ICD-10-CM | POA: Diagnosis not present

## 2024-05-31 NOTE — Patient Instructions (Signed)
 Mr. Jordan [name] , Thank you for taking time out of your busy schedule to complete your Annual Wellness Visit with me. I enjoyed our conversation and look forward to speaking with you again next year. I, as well as your care team,  appreciate your ongoing commitment to your health goals. Please review the following plan we discussed and let me know if I can assist you in the future. Your Game plan/ To Do List    Referrals: If you haven't heard from the office you've been referred to, please reach out to them at the phone provided.  N/a Follow up Visits: Next Medicare AWV with our clinical staff: 07/04/2025 at 10:10   Have you seen your provider in the last 6 months (3 months if uncontrolled diabetes)? Yes Next Office Visit with your provider: did not want to schedule at this time  Clinician Recommendations:  Aim for 30 minutes of exercise or brisk walking, 6-8 glasses of water, and 5 servings of fruits and vegetables each day.       This is a list of the screening recommended for you and due dates:  Health Maintenance  Topic Date Due   Pneumococcal Vaccination (1 of 2 - PCV) Never done   COVID-19 Vaccine (1 - 2024-25 season) Never done   Flu Shot  07/27/2024   Medicare Annual Wellness Visit  05/31/2025   DTaP/Tdap/Td vaccine (3 - Td or Tdap) 06/26/2029   Hepatitis C Screening  Completed   HIV Screening  Completed   HPV Vaccine  Aged Out   Meningitis B Vaccine  Aged Out    Advanced directives: (ACP Link)Information on Advanced Care Planning can be found at [place]  Secretary of [facility] Advance Health Care Directives Advance Health Care Directives. [email or web address]  Advance Care Planning is important because it:  [x]  Makes sure you receive the medical care that is consistent with your values, goals, and preferences  [x]  It provides guidance to your family and loved ones and reduces their decisional burden about whether or not they are making the right decisions based on your wishes.  Follow  the link provided in your after visit summary or read over the paperwork we have mailed to you to help you started getting your Advance Directives in place. If you need assistance in completing these, please reach out to us  so that we can help you!  See attachments for Preventive Care and Fall Prevention Tips.

## 2024-05-31 NOTE — Progress Notes (Signed)
 Subjective:   Jordan [name] is a 43 y.o. who presents for a Medicare Wellness preventive visit.  As a reminder, Annual Wellness Visits don't include a physical exam, and some assessments may be limited, especially if this visit is performed virtually. We may recommend an in-person follow-up visit with your provider if needed.  Visit Complete: Virtual I connected with  Jordan [name] on 05/31/24 by a audio enabled telemedicine application and verified that I am speaking with the correct person using two identifiers.  Patient Location: Home  Provider Location: Home Office  I discussed the limitations of evaluation and management by telemedicine. The patient expressed understanding and agreed to proceed.  Vital Signs: Because this visit was a virtual/telehealth visit, some criteria may be missing or patient reported. Any vitals not documented were not able to be obtained and vitals that have been documented are patient reported.  VideoError- Librarian, academic were attempted between this provider and patient, however failed, due to patient having technical difficulties OR patient did not have access to video capability.  We continued and completed visit with audio only.   Persons Participating in Visit: Patient.  AWV Questionnaire: No: Patient Medicare AWV questionnaire was not completed prior to this visit.  Cardiac Risk Factors include: advanced age (>69men, >60 women);hypertension;male gender     Objective:     Today's Vitals   There is no height or weight on file to calculate BMI.     05/31/2024   10:16 AM 11/06/2023    6:00 PM 11/05/2023   11:14 PM 05/24/2023    1:51 PM 10/15/2020    4:19 PM 10/01/2020    7:24 AM 09/30/2020   10:14 AM  Advanced Directives  Does Patient Have a Medical Advance Directive? No  No No No No No  Would patient like information on creating a medical advance directive? No - Patient declined  No - Patient declined  No - Patient declined No - Patient declined No - Patient declined No - Patient declined    Current Medications (verified) Outpatient Encounter Medications as of 05/31/2024  Medication Sig   albuterol  (VENTOLIN  HFA) 108 (90 Base) MCG/ACT inhaler Inhale 2 puffs into the lungs every 6 (six) hours as needed for wheezing or shortness of breath.   aspirin 81 MG tablet Take 81 mg by mouth daily.    Blood Pressure Monitor DEVI Check bp daily in the am   cetirizine  (ZYRTEC ) 10 MG tablet Take 1 tablet (10 mg total) by mouth daily.   cyclobenzaprine  (FLEXERIL ) 10 MG tablet Take 1 tablet (10 mg total) by mouth 2 (two) times daily as needed for muscle spasms.   diclofenac  Sodium (VOLTAREN ) 1 % GEL Apply 2 g topically as needed.   polyethylene glycol (MIRALAX  / GLYCOLAX ) 17 g packet Take 17 g by mouth daily.   oseltamivir  (TAMIFLU ) 75 MG capsule Take 1 capsule (75 mg total) by mouth 2 (two) times daily. (Patient not taking: Reported on 05/31/2024)   promethazine -dextromethorphan  (PROMETHAZINE -DM) 6.25-15 MG/5ML syrup Take 5 mLs by mouth 4 (four) times daily as needed. (Patient not taking: Reported on 05/31/2024)   No facility-administered encounter medications on file as of 05/31/2024.    Allergies (verified) Butrans [buprenorphine hcl], Nucynta [tapentadol hydrochloride], Glucose polymer, Sumac, and Bactrim  [sulfamethoxazole -trimethoprim ]   History: Past Medical History:  Diagnosis Date   Anxiety and depression    Asthma    Chronic pain syndrome    GERD (gastroesophageal reflux disease)    History  of kidney stones    HYPERTENSION, MILD    OSTEOARTHRITIS    Recurrent UTI    Sleep apnea    Past Surgical History:  Procedure Laterality Date   COLON SURGERY     COLONOSCOPY WITH PROPOFOL  N/A 09/30/2020   Procedure: COLONOSCOPY WITH PROPOFOL ;  Surgeon: Marnee Sink, MD;  Location: [facility];  Service: Endoscopy;  Laterality: N/A;   JOINT REPLACEMENT     KNEE SURGERY Right     7 total surgeries   LYSIS OF ADHESION  10/15/2020   Procedure: LYSIS OF ADHESION;  Surgeon: Flynn Hylan, MD;  Location: [facility] ORS;  Service: General;;   REPLACEMENT TOTAL KNEE Right    XI ROBOT ASSISTED DIAGNOSTIC LAPAROSCOPY N/A 10/15/2020   Procedure: XI ROBOT ASSISTED DIAGNOSTIC LAPAROSCOPY, HARTMAN'S;  Surgeon: Flynn Hylan, MD;  Location: [facility] ORS;  Service: General;  Laterality: N/A;   Family History  Problem Relation Age of Onset   Depression Mother    Dementia Mother    Heart disease Father    Heart attack Father    Diabetes Father    High Cholesterol Father    High blood pressure Father    Heart disease Paternal Grandmother    Heart attack Paternal Grandmother    Diabetes Paternal Grandmother    Heart disease Paternal Grandfather    High Cholesterol Paternal Grandfather    Cancer Maternal Aunt        lung   Hypertension Maternal Uncle    Cancer Maternal Uncle        lung   Diabetes Paternal Aunt    Heart disease Paternal Uncle    Cancer Paternal Uncle    Heart attack Paternal Uncle    Diabetes Paternal Uncle    High Cholesterol Paternal Uncle    Social History   Socioeconomic History   Marital status: Married    Spouse name: Designer, television/film set   Number of children: 2   Years of education: some college   Highest education level: Not on file  Occupational History    Employer: UNEMPLOYED    Comment: Disabled  Tobacco Use   Smoking status: Former    Types: Cigars    Quit date: 01/18/2014    Years since quitting: 10.3    Passive exposure: Never   Smokeless tobacco: Never  Vaping Use   Vaping status: Never Used  Substance and Sexual Activity   Alcohol use: Yes    Alcohol/week: 13.0 standard drinks of alcohol    Types: 7 Standard drinks or equivalent, 6 Cans of beer per week   Drug use: Yes    Frequency: 7.0 times per week    Types: Marijuana    Comment: 09/28/20    Sexual activity: Yes  Other Topics Concern   Not on file  Social History  Narrative   Caffeine Use:  6 beverages daily   Regular exercise:  2 x weekly         Social Drivers of Health   Financial Resource Strain: Low Risk  (05/31/2024)   Overall Financial Resource Strain (CARDIA)    Difficulty of Paying Living Expenses: Not hard at all  Food Insecurity: No Food Insecurity (05/31/2024)   Hunger Vital Sign    Worried About Running Out of Food in the Last Year: Never true    Ran Out of Food in the Last Year: Never true  Transportation Needs: No Transportation Needs (05/31/2024)   PRAPARE - Administrator, Civil Service (Medical): No  Lack of Transportation (Non-Medical): No  Physical Activity: Insufficiently Active (05/31/2024)   Exercise Vital Sign    Days of Exercise per Week: 7 days    Minutes of Exercise per Session: 20 min  Stress: Stress Concern Present (05/31/2024)   [employer] of Occupational Health - Occupational Stress Questionnaire    Feeling of Stress : To some extent  Social Connections: Moderately Isolated (05/31/2024)   Social Connection and Isolation Panel [NHANES]    Frequency of Communication with Friends and Family: Three times a week    Frequency of Social Gatherings with Friends and Family: Not on file    Attends Religious Services: Never    Database administrator or Organizations: No    Attends Engineer, structural: Never    Marital Status: Married    Tobacco Counseling Counseling given: Not Answered    Clinical Intake:  Pre-visit preparation completed: Yes  Pain : 0-10 Pain Type: Chronic pain Pain Location: Knee Pain Orientation: Right Pain Descriptors / Indicators: Aching Pain Onset: More than a month ago Pain Frequency: Constant     Nutritional Risks: Nausea/ vomitting/ diarrhea (has ostomy) Diabetes: No  Lab Results  Component Value Date   HGBA1C 5.7 (H) 08/09/2023   HGBA1C 5.5 08/24/2021   HGBA1C 5.9 (H) 10/10/2020     How often do you need to have someone help you when you read  instructions, pamphlets, or other written materials from your doctor or pharmacy?: 1 - Never  Interpreter Needed?: No  Information entered by :: NAllen LPN   Activities of Daily Living     05/31/2024   10:10 AM 11/06/2023    5:00 PM  In your present state of health, do you have any difficulty performing the following activities:  Hearing? 0 0  Vision? 0 0  Difficulty concentrating or making decisions? 0 0  Walking or climbing stairs? 1   Dressing or bathing? 0   Doing errands, shopping? 0 0  Preparing Food and eating ? N   Using the Toilet? N   In the past six months, have you accidently leaked urine? N   Do you have problems with loss of bowel control? N   Managing your Medications? N   Managing your Finances? N   Housekeeping or managing your Housekeeping? N     Patient Care Team: Laneta Pintos, MD as PCP - General (Family Medicine) Jeneane Miracle Dorathy Gals, MD as Referring Physician (Orthopedic Surgery) Lind Repine, MD as Consulting Physician (Pulmonary Disease) Augustin Leber, RN as [facility] Care Management  I have updated your Care Teams any recent Medical Services you may have received from other providers in the past year.     Assessment:    This is a routine wellness examination for Brandon.  Hearing/Vision screen Hearing Screening - Comments:: Denies hearing issues Vision Screening - Comments:: Regular eye exams, [facility]   Goals Addressed             This Visit's Progress    Patient Stated       05/31/2024, wants to lose weight       Depression Screen     05/31/2024   10:19 AM 02/06/2024    8:12 AM 08/04/2023    1:19 PM 05/24/2023    1:48 PM 04/28/2023   11:14 AM 12/23/2021    9:43 AM 08/24/2021    9:25 AM  PHQ 2/9 Scores  PHQ - 2 Score 1 0 1 0 1 2 0  PHQ- 9 Score 8 2 4  0 6 4 3     Fall Risk     05/31/2024   10:17 AM 05/24/2023    1:51 PM 04/28/2023   11:14 AM 03/23/2022   10:12 AM 12/23/2021    9:43 AM  Fall Risk   Falls  in the past year? 1 0 0 0 0  Comment fell on steps      Number falls in past yr: 0 0 0 0 0  Injury with Fall? 1 0 0 0 0  Comment hurt knee      Risk for fall due to : Impaired mobility;Medication side effect No Fall Risks No Fall Risks No Fall Risks No Fall Risks  Follow up Falls prevention discussed;Falls evaluation completed Falls prevention discussed Falls evaluation completed Falls evaluation completed Falls evaluation completed    MEDICARE RISK AT HOME:  Medicare Risk at Home Any stairs in or around the home?: Yes If so, are there any without handrails?: No Home free of loose throw rugs in walkways, pet beds, electrical cords, etc?: Yes Adequate lighting in your home to reduce risk of falls?: Yes Life alert?: No Use of a cane, walker or w/c?: Yes Grab bars in the bathroom?: No Shower chair or bench in shower?: No Elevated toilet seat or a handicapped toilet?: Yes  TIMED UP AND GO:  Was the test performed?  No  Cognitive Function: 6CIT completed        05/31/2024   10:23 AM 05/24/2023    1:51 PM  6CIT Screen  What Year? 0 points 0 points  What month? 0 points 0 points  What time? 0 points 0 points  Count back from 20 0 points 0 points  Months in reverse 0 points 0 points  Repeat phrase 2 points 2 points  Total Score 2 points 2 points    Immunizations Immunization History  Administered Date(s) Administered   Influenza Split 09/27/2011   Influenza,inj,Quad PF,6+ Mos 10/24/2014, 09/17/2015, 10/01/2016, 11/06/2018   Influenza-Unspecified 09/05/2017   Td 04/26/2009   Tdap 06/27/2019    Screening Tests Health Maintenance  Topic Date Due   Pneumococcal Vaccine 17-32 Years old (1 of 2 - PCV) Never done   COVID-19 Vaccine (1 - 2024-25 season) Never done   INFLUENZA VACCINE  07/27/2024   Medicare Annual Wellness (AWV)  05/31/2025   DTaP/Tdap/Td (3 - Td or Tdap) 06/26/2029   Hepatitis C Screening  Completed   HIV Screening  Completed   HPV VACCINES  Aged Out    Meningococcal B Vaccine  Aged Out    Health Maintenance  Health Maintenance Due  Topic Date Due   Pneumococcal Vaccine 67-24 Years old (1 of 2 - PCV) Never done   COVID-19 Vaccine (1 - 2024-25 season) Never done   Health Maintenance Items Addressed: Declines vaccines.  Additional Screening:  Vision Screening: Recommended annual ophthalmology exams for early detection of glaucoma and other disorders of the eye. Would you like a referral to an eye doctor? No    Dental Screening: Recommended annual dental exams for proper oral hygiene  Community Resource Referral / Chronic Care Management: CRR required this visit?  No   CCM required this visit?  No   Plan:    I have personally reviewed and noted the following in the patient's chart:   Medical and social history Use of alcohol, tobacco or illicit drugs  Current medications and supplements including opioid prescriptions. Patient is not currently taking opioid prescriptions. Functional ability and  status Nutritional status Physical activity Advanced directives List of other physicians Hospitalizations, surgeries, and ER visits in previous 12 months Vitals Screenings to include cognitive, depression, and falls Referrals and appointments  In addition, I have reviewed and discussed with patient certain preventive protocols, quality metrics, and best practice recommendations. A written personalized care plan for preventive services as well as general preventive health recommendations were provided to patient.   Areatha Beecham, LPN   04/26/8840   After Visit Summary: (MyChart) Due to this being a telephonic visit, the after visit summary with patients personalized plan was offered to patient via MyChart   Notes: Nothing significant to report at this time.

## 2024-06-14 DIAGNOSIS — Z933 Colostomy status: Secondary | ICD-10-CM | POA: Diagnosis not present

## 2024-06-14 DIAGNOSIS — K631 Perforation of intestine (nontraumatic): Secondary | ICD-10-CM | POA: Diagnosis not present

## 2024-06-14 DIAGNOSIS — K5732 Diverticulitis of large intestine without perforation or abscess without bleeding: Secondary | ICD-10-CM | POA: Diagnosis not present

## 2024-06-26 ENCOUNTER — Emergency Department (HOSPITAL_COMMUNITY)

## 2024-06-26 ENCOUNTER — Encounter (HOSPITAL_COMMUNITY): Payer: Self-pay | Admitting: Emergency Medicine

## 2024-06-26 ENCOUNTER — Other Ambulatory Visit: Payer: Self-pay

## 2024-06-26 ENCOUNTER — Inpatient Hospital Stay (HOSPITAL_COMMUNITY)
Admission: EM | Admit: 2024-06-26 | Discharge: 2024-07-08 | DRG: 329 | Disposition: A | Discharge status: Home Health | Attending: Internal Medicine | Admitting: Internal Medicine

## 2024-06-26 ENCOUNTER — Encounter (HOSPITAL_COMMUNITY)
Admission: EM | Disposition: A | Discharge status: Home Health | Payer: Self-pay | Source: Home / Self Care | Attending: Internal Medicine

## 2024-06-26 ENCOUNTER — Inpatient Hospital Stay (HOSPITAL_COMMUNITY): Admitting: Anesthesiology

## 2024-06-26 DIAGNOSIS — K922 Gastrointestinal hemorrhage, unspecified: Secondary | ICD-10-CM | POA: Diagnosis not present

## 2024-06-26 DIAGNOSIS — F419 Anxiety disorder, unspecified: Secondary | ICD-10-CM | POA: Diagnosis present

## 2024-06-26 DIAGNOSIS — I160 Hypertensive urgency: Secondary | ICD-10-CM | POA: Diagnosis present

## 2024-06-26 DIAGNOSIS — R739 Hyperglycemia, unspecified: Secondary | ICD-10-CM | POA: Diagnosis not present

## 2024-06-26 DIAGNOSIS — K219 Gastro-esophageal reflux disease without esophagitis: Secondary | ICD-10-CM | POA: Diagnosis present

## 2024-06-26 DIAGNOSIS — Z96651 Presence of right artificial knee joint: Secondary | ICD-10-CM | POA: Diagnosis present

## 2024-06-26 DIAGNOSIS — K56609 Unspecified intestinal obstruction, unspecified as to partial versus complete obstruction: Secondary | ICD-10-CM | POA: Diagnosis not present

## 2024-06-26 DIAGNOSIS — G894 Chronic pain syndrome: Secondary | ICD-10-CM | POA: Diagnosis present

## 2024-06-26 DIAGNOSIS — J45909 Unspecified asthma, uncomplicated: Secondary | ICD-10-CM | POA: Diagnosis not present

## 2024-06-26 DIAGNOSIS — E66813 Obesity, class 3: Secondary | ICD-10-CM | POA: Diagnosis present

## 2024-06-26 DIAGNOSIS — X500XXA Overexertion from strenuous movement or load, initial encounter: Secondary | ICD-10-CM

## 2024-06-26 DIAGNOSIS — Z6841 Body Mass Index (BMI) 40.0 and over, adult: Secondary | ICD-10-CM

## 2024-06-26 DIAGNOSIS — R188 Other ascites: Secondary | ICD-10-CM | POA: Diagnosis not present

## 2024-06-26 DIAGNOSIS — Z8249 Family history of ischemic heart disease and other diseases of the circulatory system: Secondary | ICD-10-CM

## 2024-06-26 DIAGNOSIS — R1110 Vomiting, unspecified: Secondary | ICD-10-CM | POA: Diagnosis not present

## 2024-06-26 DIAGNOSIS — Z8719 Personal history of other diseases of the digestive system: Secondary | ICD-10-CM

## 2024-06-26 DIAGNOSIS — Z9049 Acquired absence of other specified parts of digestive tract: Secondary | ICD-10-CM

## 2024-06-26 DIAGNOSIS — G4733 Obstructive sleep apnea (adult) (pediatric): Secondary | ICD-10-CM | POA: Diagnosis present

## 2024-06-26 DIAGNOSIS — E876 Hypokalemia: Secondary | ICD-10-CM | POA: Diagnosis not present

## 2024-06-26 DIAGNOSIS — K660 Peritoneal adhesions (postprocedural) (postinfection): Secondary | ICD-10-CM | POA: Diagnosis present

## 2024-06-26 DIAGNOSIS — D72829 Elevated white blood cell count, unspecified: Secondary | ICD-10-CM | POA: Diagnosis present

## 2024-06-26 DIAGNOSIS — E872 Acidosis, unspecified: Secondary | ICD-10-CM | POA: Diagnosis not present

## 2024-06-26 DIAGNOSIS — K828 Other specified diseases of gallbladder: Secondary | ICD-10-CM | POA: Diagnosis not present

## 2024-06-26 DIAGNOSIS — R7303 Prediabetes: Secondary | ICD-10-CM | POA: Diagnosis not present

## 2024-06-26 DIAGNOSIS — T8189XA Other complications of procedures, not elsewhere classified, initial encounter: Secondary | ICD-10-CM | POA: Diagnosis not present

## 2024-06-26 DIAGNOSIS — E8729 Other acidosis: Secondary | ICD-10-CM | POA: Diagnosis not present

## 2024-06-26 DIAGNOSIS — Z882 Allergy status to sulfonamides status: Secondary | ICD-10-CM

## 2024-06-26 DIAGNOSIS — Z87440 Personal history of urinary (tract) infections: Secondary | ICD-10-CM

## 2024-06-26 DIAGNOSIS — Z87442 Personal history of urinary calculi: Secondary | ICD-10-CM

## 2024-06-26 DIAGNOSIS — K433 Parastomal hernia with obstruction, without gangrene: Principal | ICD-10-CM | POA: Diagnosis present

## 2024-06-26 DIAGNOSIS — N179 Acute kidney failure, unspecified: Secondary | ICD-10-CM | POA: Diagnosis not present

## 2024-06-26 DIAGNOSIS — K651 Peritoneal abscess: Secondary | ICD-10-CM | POA: Diagnosis present

## 2024-06-26 DIAGNOSIS — Z833 Family history of diabetes mellitus: Secondary | ICD-10-CM

## 2024-06-26 DIAGNOSIS — Z818 Family history of other mental and behavioral disorders: Secondary | ICD-10-CM

## 2024-06-26 DIAGNOSIS — Z888 Allergy status to other drugs, medicaments and biological substances status: Secondary | ICD-10-CM

## 2024-06-26 DIAGNOSIS — K55069 Acute infarction of intestine, part and extent unspecified: Secondary | ICD-10-CM | POA: Diagnosis not present

## 2024-06-26 DIAGNOSIS — Z801 Family history of malignant neoplasm of trachea, bronchus and lung: Secondary | ICD-10-CM

## 2024-06-26 DIAGNOSIS — K9403 Colostomy malfunction: Secondary | ICD-10-CM | POA: Diagnosis not present

## 2024-06-26 DIAGNOSIS — F32A Depression, unspecified: Secondary | ICD-10-CM | POA: Diagnosis present

## 2024-06-26 DIAGNOSIS — K55011 Focal (segmental) acute (reversible) ischemia of small intestine: Secondary | ICD-10-CM | POA: Diagnosis not present

## 2024-06-26 DIAGNOSIS — K565 Intestinal adhesions [bands], unspecified as to partial versus complete obstruction: Secondary | ICD-10-CM | POA: Diagnosis not present

## 2024-06-26 DIAGNOSIS — I10 Essential (primary) hypertension: Secondary | ICD-10-CM | POA: Diagnosis present

## 2024-06-26 DIAGNOSIS — Z7982 Long term (current) use of aspirin: Secondary | ICD-10-CM

## 2024-06-26 DIAGNOSIS — Z881 Allergy status to other antibiotic agents status: Secondary | ICD-10-CM

## 2024-06-26 DIAGNOSIS — K6389 Other specified diseases of intestine: Secondary | ICD-10-CM | POA: Diagnosis not present

## 2024-06-26 DIAGNOSIS — K435 Parastomal hernia without obstruction or  gangrene: Secondary | ICD-10-CM | POA: Diagnosis not present

## 2024-06-26 DIAGNOSIS — Z87891 Personal history of nicotine dependence: Secondary | ICD-10-CM

## 2024-06-26 DIAGNOSIS — Z933 Colostomy status: Secondary | ICD-10-CM | POA: Diagnosis not present

## 2024-06-26 DIAGNOSIS — R109 Unspecified abdominal pain: Secondary | ICD-10-CM | POA: Diagnosis not present

## 2024-06-26 DIAGNOSIS — K469 Unspecified abdominal hernia without obstruction or gangrene: Secondary | ICD-10-CM

## 2024-06-26 DIAGNOSIS — Z83438 Family history of other disorder of lipoprotein metabolism and other lipidemia: Secondary | ICD-10-CM

## 2024-06-26 HISTORY — PX: LAPAROTOMY: SHX154

## 2024-06-26 LAB — POCT I-STAT EG7
Acid-Base Excess: 1 mmol/L (ref 0.0–2.0)
Bicarbonate: 30.3 mmol/L — ABNORMAL HIGH (ref 20.0–28.0)
Calcium, Ion: 1.14 mmol/L — ABNORMAL LOW (ref 1.15–1.40)
HCT: 51 % (ref 39.0–52.0)
Hemoglobin: 17.3 g/dL — ABNORMAL HIGH (ref 13.0–17.0)
O2 Saturation: 20 %
Potassium: 6.1 mmol/L — ABNORMAL HIGH (ref 3.5–5.1)
Sodium: 142 mmol/L (ref 135–145)
TCO2: 32 mmol/L (ref 22–32)
pCO2, Ven: 65.6 mmHg — ABNORMAL HIGH (ref 44–60)
pH, Ven: 7.273 (ref 7.25–7.43)
pO2, Ven: 18 mmHg — CL (ref 32–45)

## 2024-06-26 LAB — CBC WITH DIFFERENTIAL/PLATELET
Abs Immature Granulocytes: 0.07 10*3/uL (ref 0.00–0.07)
Basophils Absolute: 0.1 10*3/uL (ref 0.0–0.1)
Basophils Relative: 1 %
Eosinophils Absolute: 0.2 10*3/uL (ref 0.0–0.5)
Eosinophils Relative: 2 %
HCT: 51.6 % (ref 39.0–52.0)
Hemoglobin: 16.2 g/dL (ref 13.0–17.0)
Immature Granulocytes: 1 %
Lymphocytes Relative: 44 %
Lymphs Abs: 4.4 10*3/uL — ABNORMAL HIGH (ref 0.7–4.0)
MCH: 27.1 pg (ref 26.0–34.0)
MCHC: 31.4 g/dL (ref 30.0–36.0)
MCV: 86.3 fL (ref 80.0–100.0)
Monocytes Absolute: 0.6 10*3/uL (ref 0.1–1.0)
Monocytes Relative: 7 %
Neutro Abs: 4.4 10*3/uL (ref 1.7–7.7)
Neutrophils Relative %: 45 %
Platelets: 333 10*3/uL (ref 150–400)
RBC: 5.98 MIL/uL — ABNORMAL HIGH (ref 4.22–5.81)
RDW: 14.4 % (ref 11.5–15.5)
WBC: 9.8 10*3/uL (ref 4.0–10.5)
nRBC: 0 % (ref 0.0–0.2)

## 2024-06-26 LAB — POCT I-STAT 7, (LYTES, BLD GAS, ICA,H+H)
Acid-base deficit: 2 mmol/L (ref 0.0–2.0)
Bicarbonate: 23.8 mmol/L (ref 20.0–28.0)
Calcium, Ion: 1.16 mmol/L (ref 1.15–1.40)
HCT: 47 % (ref 39.0–52.0)
Hemoglobin: 16 g/dL (ref 13.0–17.0)
O2 Saturation: 100 %
Patient temperature: 34.8
Potassium: 4.5 mmol/L (ref 3.5–5.1)
Sodium: 141 mmol/L (ref 135–145)
TCO2: 25 mmol/L (ref 22–32)
pCO2 arterial: 37.4 mmHg (ref 32–48)
pH, Arterial: 7.402 (ref 7.35–7.45)
pO2, Arterial: 181 mmHg — ABNORMAL HIGH (ref 83–108)

## 2024-06-26 LAB — COMPREHENSIVE METABOLIC PANEL WITH GFR
ALT: 17 U/L (ref 0–44)
AST: 29 U/L (ref 15–41)
Albumin: 4.2 g/dL (ref 3.5–5.0)
Alkaline Phosphatase: 60 U/L (ref 38–126)
Anion gap: 19 — ABNORMAL HIGH (ref 5–15)
BUN: 7 mg/dL (ref 6–20)
CO2: 19 mmol/L — ABNORMAL LOW (ref 22–32)
Calcium: 9.4 mg/dL (ref 8.9–10.3)
Chloride: 98 mmol/L (ref 98–111)
Creatinine, Ser: 1.03 mg/dL (ref 0.61–1.24)
GFR, Estimated: >60 mL/min (ref >60)
Glucose, Bld: 157 mg/dL — ABNORMAL HIGH (ref 70–99)
Potassium: 3.9 mmol/L (ref 3.5–5.1)
Sodium: 136 mmol/L (ref 135–145)
Total Bilirubin: 0.8 mg/dL (ref 0.0–1.2)
Total Protein: 7.8 g/dL (ref 6.5–8.1)

## 2024-06-26 LAB — TYPE AND SCREEN
ABO/RH(D): A POS
Antibody Screen: NEGATIVE

## 2024-06-26 LAB — LACTIC ACID, PLASMA: Lactic Acid, Venous: 3.4 mmol/L (ref 0.5–1.9)

## 2024-06-26 LAB — LIPASE, BLOOD: Lipase: 33 U/L (ref 11–51)

## 2024-06-26 LAB — HEMOGLOBIN A1C
Hgb A1c MFr Bld: 5.6 % (ref 4.8–5.6)
Mean Plasma Glucose: 114.02 mg/dL

## 2024-06-26 LAB — MAGNESIUM: Magnesium: 2.1 mg/dL (ref 1.7–2.4)

## 2024-06-26 SURGERY — LAPAROTOMY, EXPLORATORY
Anesthesia: General | Site: Abdomen

## 2024-06-26 MED ORDER — OXYCODONE HCL 5 MG/5ML PO SOLN: 5.0000 mg | Freq: Once | ORAL | Status: DC | PRN

## 2024-06-26 MED ORDER — ORAL CARE MOUTH RINSE: 15.0000 mL | Freq: Once | OROMUCOSAL | Status: AC

## 2024-06-26 MED ORDER — LIDOCAINE 2% (20 MG/ML) 5 ML SYRINGE
INTRAMUSCULAR | Status: DC | PRN
  Administered 2024-06-26: 60 mg via INTRAVENOUS

## 2024-06-26 MED ORDER — PIPERACILLIN-TAZOBACTAM 3.375 G IVPB: 3.3750 g | Freq: Three times a day (TID) | INTRAVENOUS | Status: DC

## 2024-06-26 MED ORDER — SODIUM CHLORIDE 0.9 % IV SOLN
12.5000 mg | Freq: Once | INTRAVENOUS | Status: AC
  Administered 2024-06-26: 12.5 mg via INTRAVENOUS
  Filled 2024-06-26: qty 12.5

## 2024-06-26 MED ORDER — ONDANSETRON HCL 4 MG/2ML IJ SOLN: 4.0000 mg | Freq: Four times a day (QID) | INTRAMUSCULAR | Status: DC | PRN

## 2024-06-26 MED ORDER — LIDOCAINE 2% (20 MG/ML) 5 ML SYRINGE
INTRAMUSCULAR | Status: AC
  Filled 2024-06-26: qty 5

## 2024-06-26 MED ORDER — PROCHLORPERAZINE EDISYLATE 10 MG/2ML IJ SOLN: 5.0000 mg | Freq: Four times a day (QID) | INTRAMUSCULAR | Status: DC | PRN

## 2024-06-26 MED ORDER — HYDROMORPHONE HCL 1 MG/ML IJ SOLN
INTRAMUSCULAR | Status: AC
  Filled 2024-06-26: qty 0.5

## 2024-06-26 MED ORDER — ONDANSETRON HCL 4 MG/2ML IJ SOLN
INTRAMUSCULAR | Status: AC
Start: 2024-06-26 — End: 2024-06-26
  Filled 2024-06-26: qty 2

## 2024-06-26 MED ORDER — ESMOLOL HCL 100 MG/10ML IV SOLN
INTRAVENOUS | Status: AC
  Filled 2024-06-26: qty 10

## 2024-06-26 MED ORDER — FENTANYL CITRATE (PF) 100 MCG/2ML IJ SOLN
50.0000 ug | INTRAMUSCULAR | Status: DC | PRN
  Administered 2024-06-26: 50 ug via INTRAVENOUS
  Filled 2024-06-26: qty 2

## 2024-06-26 MED ORDER — SODIUM CHLORIDE 0.9 % IV SOLN: INTRAVENOUS | Status: AC

## 2024-06-26 MED ORDER — PANTOPRAZOLE SODIUM 40 MG IV SOLR
INTRAVENOUS | Status: AC
  Filled 2024-06-26: qty 10

## 2024-06-26 MED ORDER — ALBUMIN HUMAN 5 % IV SOLN
INTRAVENOUS | Status: AC
  Filled 2024-06-26: qty 250

## 2024-06-26 MED ORDER — METHOCARBAMOL 1000 MG/10ML IJ SOLN
500.0000 mg | Freq: Four times a day (QID) | INTRAMUSCULAR | Status: DC | PRN
  Administered 2024-06-29 – 2024-07-04 (×5): 500 mg via INTRAVENOUS
  Filled 2024-06-26 (×5): qty 10

## 2024-06-26 MED ORDER — FENTANYL CITRATE (PF) 100 MCG/2ML IJ SOLN
INTRAMUSCULAR | Status: AC
Start: 2024-06-26 — End: 2024-06-26
  Filled 2024-06-26: qty 2

## 2024-06-26 MED ORDER — IOHEXOL 350 MG/ML SOLN
100.0000 mL | Freq: Once | INTRAVENOUS | Status: AC | PRN
  Administered 2024-06-26: 100 mL via INTRAVENOUS

## 2024-06-26 MED ORDER — ALBUMIN HUMAN 5 % IV SOLN
12.5000 g | Freq: Once | INTRAVENOUS | Status: AC
  Administered 2024-06-26: 12.5 g via INTRAVENOUS

## 2024-06-26 MED ORDER — ROCURONIUM BROMIDE 10 MG/ML (PF) SYRINGE
PREFILLED_SYRINGE | INTRAVENOUS | Status: AC
Start: 2024-06-26 — End: 2024-06-26
  Filled 2024-06-26: qty 10

## 2024-06-26 MED ORDER — DEXAMETHASONE SODIUM PHOSPHATE 10 MG/ML IJ SOLN
INTRAMUSCULAR | Status: DC | PRN
  Administered 2024-06-26: 10 mg via INTRAVENOUS

## 2024-06-26 MED ORDER — PROPOFOL 10 MG/ML IV BOLUS
INTRAVENOUS | Status: AC
  Filled 2024-06-26: qty 20

## 2024-06-26 MED ORDER — MORPHINE SULFATE (PF) 4 MG/ML IV SOLN
4.0000 mg | Freq: Once | INTRAVENOUS | Status: AC
Start: 2024-06-26 — End: 2024-06-26
  Administered 2024-06-26: 4 mg via INTRAVENOUS
  Filled 2024-06-26: qty 1

## 2024-06-26 MED ORDER — HYDROMORPHONE HCL 1 MG/ML IJ SOLN
0.5000 mg | INTRAMUSCULAR | Status: DC | PRN
  Administered 2024-06-26 (×2): 1 mg via INTRAVENOUS
  Filled 2024-06-26 (×2): qty 1

## 2024-06-26 MED ORDER — HYDROMORPHONE HCL 1 MG/ML IJ SOLN
0.2500 mg | INTRAMUSCULAR | Status: DC | PRN
  Administered 2024-06-26 (×4): 0.5 mg via INTRAVENOUS

## 2024-06-26 MED ORDER — ENOXAPARIN SODIUM 30 MG/0.3ML IJ SOSY: 30.0000 mg | PREFILLED_SYRINGE | INTRAMUSCULAR | Status: DC

## 2024-06-26 MED ORDER — DIPHENHYDRAMINE HCL 50 MG/ML IJ SOLN: 12.5000 mg | Freq: Four times a day (QID) | INTRAMUSCULAR | Status: DC | PRN

## 2024-06-26 MED ORDER — HYDROMORPHONE HCL 1 MG/ML IJ SOLN: 1.0000 mg | INTRAMUSCULAR | Status: DC | PRN

## 2024-06-26 MED ORDER — PANTOPRAZOLE SODIUM 40 MG IV SOLR
40.0000 mg | Freq: Every day | INTRAVENOUS | Status: DC
  Administered 2024-06-26 – 2024-07-07 (×12): 40 mg via INTRAVENOUS
  Filled 2024-06-26 (×11): qty 10

## 2024-06-26 MED ORDER — HYDROMORPHONE HCL 1 MG/ML IJ SOLN
2.0000 mg | Freq: Once | INTRAMUSCULAR | Status: AC
  Administered 2024-06-26: 2 mg via INTRAVENOUS
  Filled 2024-06-26: qty 2

## 2024-06-26 MED ORDER — HYDROMORPHONE HCL 1 MG/ML IJ SOLN: 0.5000 mg | INTRAMUSCULAR | Status: DC | PRN

## 2024-06-26 MED ORDER — CHLORHEXIDINE GLUCONATE 0.12 % MT SOLN
OROMUCOSAL | Status: AC
  Administered 2024-06-26: 15 mL via OROMUCOSAL
  Filled 2024-06-26: qty 15

## 2024-06-26 MED ORDER — KETAMINE HCL 50 MG/5ML IJ SOSY
PREFILLED_SYRINGE | INTRAMUSCULAR | Status: AC
  Filled 2024-06-26: qty 5

## 2024-06-26 MED ORDER — ONDANSETRON HCL 4 MG/2ML IJ SOLN
4.0000 mg | Freq: Once | INTRAMUSCULAR | Status: AC
  Administered 2024-06-26: 4 mg via INTRAVENOUS
  Filled 2024-06-26: qty 2

## 2024-06-26 MED ORDER — ACETAMINOPHEN 10 MG/ML IV SOLN
INTRAVENOUS | Status: DC | PRN
  Administered 2024-06-26: 1000 mg via INTRAVENOUS

## 2024-06-26 MED ORDER — PANTOPRAZOLE SODIUM 40 MG IV SOLR
40.0000 mg | INTRAVENOUS | Status: DC
  Administered 2024-06-26: 40 mg via INTRAVENOUS
  Filled 2024-06-26: qty 10

## 2024-06-26 MED ORDER — ALBUMIN HUMAN 5 % IV SOLN: INTRAVENOUS | Status: DC | PRN

## 2024-06-26 MED ORDER — MORPHINE SULFATE 1 MG/ML IV SOLN PCA
INTRAVENOUS | Status: DC
  Administered 2024-06-27: 17.11 mg via INTRAVENOUS
  Administered 2024-06-27: 13.5 mg via INTRAVENOUS
  Administered 2024-06-27: 19.5 mg via INTRAVENOUS
  Administered 2024-06-30: 1 mg via INTRAVENOUS
  Filled 2024-06-26 (×18): qty 30

## 2024-06-26 MED ORDER — HYDRALAZINE HCL 20 MG/ML IJ SOLN
5.0000 mg | Freq: Four times a day (QID) | INTRAMUSCULAR | Status: DC | PRN
  Administered 2024-06-26: 5 mg via INTRAVENOUS
  Filled 2024-06-26: qty 1

## 2024-06-26 MED ORDER — ENOXAPARIN SODIUM 40 MG/0.4ML IJ SOSY
40.0000 mg | PREFILLED_SYRINGE | INTRAMUSCULAR | Status: DC
  Administered 2024-06-27 – 2024-06-30 (×4): 40 mg via SUBCUTANEOUS
  Filled 2024-06-26 (×4): qty 0.4

## 2024-06-26 MED ORDER — KETAMINE HCL 10 MG/ML IJ SOLN
INTRAMUSCULAR | Status: DC | PRN
Start: 2024-06-26 — End: 2024-06-26
  Administered 2024-06-26: 20 mg via INTRAVENOUS
  Administered 2024-06-26: 30 mg via INTRAVENOUS

## 2024-06-26 MED ORDER — ONDANSETRON HCL 4 MG/2ML IJ SOLN
INTRAMUSCULAR | Status: DC | PRN
  Administered 2024-06-26: 4 mg via INTRAVENOUS

## 2024-06-26 MED ORDER — ENOXAPARIN SODIUM 40 MG/0.4ML IJ SOSY: 40.0000 mg | PREFILLED_SYRINGE | INTRAMUSCULAR | Status: DC

## 2024-06-26 MED ORDER — METOPROLOL TARTRATE 5 MG/5ML IV SOLN
INTRAVENOUS | Status: DC | PRN
Start: 2024-06-26 — End: 2024-06-26
  Administered 2024-06-26 (×5): 1 mg via INTRAVENOUS

## 2024-06-26 MED ORDER — ALBUTEROL SULFATE (2.5 MG/3ML) 0.083% IN NEBU: 2.5000 mg | INHALATION_SOLUTION | Freq: Four times a day (QID) | RESPIRATORY_TRACT | Status: DC | PRN

## 2024-06-26 MED ORDER — FENTANYL CITRATE (PF) 100 MCG/2ML IJ SOLN
INTRAMUSCULAR | Status: AC
  Filled 2024-06-26: qty 2

## 2024-06-26 MED ORDER — LACTATED RINGERS IV SOLN: INTRAVENOUS | Status: DC

## 2024-06-26 MED ORDER — PROPOFOL 10 MG/ML IV BOLUS
INTRAVENOUS | Status: DC | PRN
  Administered 2024-06-26: 250 mg via INTRAVENOUS
  Administered 2024-06-26: 50 mg via INTRAVENOUS

## 2024-06-26 MED ORDER — ONDANSETRON 4 MG PO TBDP
4.0000 mg | ORAL_TABLET | Freq: Four times a day (QID) | ORAL | Status: DC | PRN
  Filled 2024-06-26: qty 1

## 2024-06-26 MED ORDER — BUPIVACAINE-EPINEPHRINE (PF) 0.25% -1:200000 IJ SOLN
INTRAMUSCULAR | Status: AC
  Filled 2024-06-26: qty 30

## 2024-06-26 MED ORDER — DEXMEDETOMIDINE HCL IN NACL 80 MCG/20ML IV SOLN
INTRAVENOUS | Status: DC | PRN
  Administered 2024-06-26 (×3): 8 ug via INTRAVENOUS
  Administered 2024-06-26: 4 ug via INTRAVENOUS

## 2024-06-26 MED ORDER — 0.9 % SODIUM CHLORIDE (POUR BTL) OPTIME
TOPICAL | Status: DC | PRN
Start: 2024-06-26 — End: 2024-06-26
  Administered 2024-06-26: 1000 mL
  Administered 2024-06-26: 2000 mL

## 2024-06-26 MED ORDER — MIDAZOLAM HCL 2 MG/2ML IJ SOLN
INTRAMUSCULAR | Status: DC | PRN
  Administered 2024-06-26: 2 mg via INTRAVENOUS

## 2024-06-26 MED ORDER — ONDANSETRON HCL 4 MG/2ML IJ SOLN
4.0000 mg | INTRAMUSCULAR | Status: DC | PRN
  Administered 2024-06-26: 4 mg via INTRAVENOUS
  Filled 2024-06-26: qty 2

## 2024-06-26 MED ORDER — FENTANYL CITRATE (PF) 250 MCG/5ML IJ SOLN
INTRAMUSCULAR | Status: DC | PRN
  Administered 2024-06-26 (×3): 50 ug via INTRAVENOUS
  Administered 2024-06-26: 100 ug via INTRAVENOUS
  Administered 2024-06-26 (×2): 50 ug via INTRAVENOUS

## 2024-06-26 MED ORDER — HYDROMORPHONE HCL 1 MG/ML IJ SOLN
INTRAMUSCULAR | Status: AC
  Filled 2024-06-26: qty 1

## 2024-06-26 MED ORDER — SODIUM CHLORIDE 0.9 % IV BOLUS
1000.0000 mL | Freq: Once | INTRAVENOUS | Status: AC
  Administered 2024-06-26: 1000 mL via INTRAVENOUS

## 2024-06-26 MED ORDER — CHLORHEXIDINE GLUCONATE 0.12 % MT SOLN: 15.0000 mL | Freq: Once | OROMUCOSAL | Status: AC

## 2024-06-26 MED ORDER — HYDROMORPHONE HCL 1 MG/ML IJ SOLN
1.0000 mg | Freq: Once | INTRAMUSCULAR | Status: AC
  Administered 2024-06-26: 1 mg via INTRAVENOUS
  Filled 2024-06-26: qty 1

## 2024-06-26 MED ORDER — ROCURONIUM BROMIDE 10 MG/ML (PF) SYRINGE
PREFILLED_SYRINGE | INTRAVENOUS | Status: DC | PRN
  Administered 2024-06-26: 20 mg via INTRAVENOUS
  Administered 2024-06-26: 10 mg via INTRAVENOUS
  Administered 2024-06-26: 30 mg via INTRAVENOUS
  Administered 2024-06-26: 50 mg via INTRAVENOUS
  Administered 2024-06-26: 20 mg via INTRAVENOUS

## 2024-06-26 MED ORDER — DIPHENHYDRAMINE HCL 12.5 MG/5ML PO ELIX
12.5000 mg | ORAL_SOLUTION | Freq: Four times a day (QID) | ORAL | Status: DC | PRN
  Administered 2024-07-07: 12.5 mg via ORAL
  Filled 2024-06-26: qty 10

## 2024-06-26 MED ORDER — BUPIVACAINE-EPINEPHRINE 0.25% -1:200000 IJ SOLN: INTRAMUSCULAR | Status: DC | PRN

## 2024-06-26 MED ORDER — MIDAZOLAM HCL 2 MG/2ML IJ SOLN
INTRAMUSCULAR | Status: AC
  Filled 2024-06-26: qty 2

## 2024-06-26 MED ORDER — SUGAMMADEX SODIUM 200 MG/2ML IV SOLN
INTRAVENOUS | Status: DC | PRN
  Administered 2024-06-26: 200 mg via INTRAVENOUS

## 2024-06-26 MED ORDER — OXYCODONE HCL 5 MG PO TABS: 5.0000 mg | ORAL_TABLET | Freq: Once | ORAL | Status: DC | PRN

## 2024-06-26 MED ORDER — ESMOLOL HCL 100 MG/10ML IV SOLN
INTRAVENOUS | Status: DC | PRN
  Administered 2024-06-26: 30 mg via INTRAVENOUS

## 2024-06-26 MED ORDER — FENTANYL CITRATE (PF) 250 MCG/5ML IJ SOLN
INTRAMUSCULAR | Status: AC
  Filled 2024-06-26: qty 5

## 2024-06-26 MED ORDER — SODIUM CHLORIDE 0.9 % IV SOLN
2.0000 g | INTRAVENOUS | Status: AC
  Administered 2024-06-26: 2 g via INTRAVENOUS
  Filled 2024-06-26 (×2): qty 2

## 2024-06-26 MED ORDER — SODIUM CHLORIDE 0.9% FLUSH: 9.0000 mL | INTRAVENOUS | Status: DC | PRN

## 2024-06-26 MED ORDER — FENTANYL CITRATE (PF) 100 MCG/2ML IJ SOLN: 25.0000 ug | INTRAMUSCULAR | Status: DC | PRN

## 2024-06-26 MED ORDER — ROCURONIUM BROMIDE 10 MG/ML (PF) SYRINGE
PREFILLED_SYRINGE | INTRAVENOUS | Status: AC
  Filled 2024-06-26: qty 10

## 2024-06-26 MED ORDER — NALOXONE HCL 0.4 MG/ML IJ SOLN: 0.4000 mg | INTRAMUSCULAR | Status: DC | PRN

## 2024-06-26 MED ORDER — SUCCINYLCHOLINE CHLORIDE 200 MG/10ML IV SOSY
PREFILLED_SYRINGE | INTRAVENOUS | Status: DC | PRN
  Administered 2024-06-26: 140 mg via INTRAVENOUS

## 2024-06-26 MED ORDER — ACETAMINOPHEN 10 MG/ML IV SOLN
INTRAVENOUS | Status: AC
  Filled 2024-06-26: qty 100

## 2024-06-26 MED ORDER — SODIUM BICARBONATE 8.4 % IV SOLN
25.0000 meq | Freq: Once | INTRAVENOUS | Status: AC
  Administered 2024-06-26: 25 meq via INTRAVENOUS
  Filled 2024-06-26: qty 50

## 2024-06-26 MED ORDER — HYDROMORPHONE HCL 1 MG/ML IJ SOLN
INTRAMUSCULAR | Status: DC | PRN
  Administered 2024-06-26 (×5): .5 mg via INTRAVENOUS

## 2024-06-26 SURGICAL SUPPLY — 58 items
BAG COUNTER SPONGE SURGICOUNT (BAG) ×2 IMPLANT
BIOPATCH RED 1 DISK 7.0 (GAUZE/BANDAGES/DRESSINGS) ×1 IMPLANT
BLADE CLIPPER SURG (BLADE) IMPLANT
BNDG GAUZE DERMACEA FLUFF 4 (GAUZE/BANDAGES/DRESSINGS) ×1 IMPLANT
CHLORAPREP W/TINT 26 (MISCELLANEOUS) ×2 IMPLANT
COVER SURGICAL LIGHT HANDLE (MISCELLANEOUS) ×2 IMPLANT
DERMABOND ADVANCED .7 DNX12 (GAUZE/BANDAGES/DRESSINGS) ×1 IMPLANT
DRAIN CHANNEL 19F RND (DRAIN) ×1 IMPLANT
DRAIN PENROSE 0.5X18 (DRAIN) ×2 IMPLANT
DRAPE LAPAROSCOPIC ABDOMINAL (DRAPES) ×2 IMPLANT
DRAPE WARM FLUID 44X44 (DRAPES) ×1 IMPLANT
DRSG OPSITE POSTOP 4X10 (GAUZE/BANDAGES/DRESSINGS) ×1 IMPLANT
DRSG OPSITE POSTOP 4X12 (GAUZE/BANDAGES/DRESSINGS) ×1 IMPLANT
DRSG TEGADERM 4X4.75 (GAUZE/BANDAGES/DRESSINGS) ×1 IMPLANT
ELECT CAUTERY BLADE 6.4 (BLADE) ×2 IMPLANT
ELECTRODE REM PT RTRN 9FT ADLT (ELECTROSURGICAL) ×2 IMPLANT
EVACUATOR SILICONE 100CC (DRAIN) ×1 IMPLANT
GLOVE BIO SURGEON STRL SZ7.5 (GLOVE) ×2 IMPLANT
GOWN STRL REUS W/ TWL LRG LVL3 (GOWN DISPOSABLE) ×4 IMPLANT
HANDLE SUCTION POOLE (INSTRUMENTS) ×1 IMPLANT
KIT BASIN OR (CUSTOM PROCEDURE TRAY) ×2 IMPLANT
KIT OSTOMY DRAINABLE 2.75 STR (WOUND CARE) ×1 IMPLANT
KIT TURNOVER KIT B (KITS) ×2 IMPLANT
LIGASURE IMPACT 36 18CM CVD LR (INSTRUMENTS) ×1 IMPLANT
NDL HYPO 25GX1X1/2 BEV (NEEDLE) ×1 IMPLANT
NEEDLE HYPO 25GX1X1/2 BEV (NEEDLE) ×2 IMPLANT
NS IRRIG 1000ML POUR BTL (IV SOLUTION) ×2 IMPLANT
PACK GENERAL/GYN (CUSTOM PROCEDURE TRAY) ×2 IMPLANT
PAD ARMBOARD POSITIONER FOAM (MISCELLANEOUS) ×2 IMPLANT
PENCIL BUTTON HOLSTER BLD 10FT (ELECTRODE) ×1 IMPLANT
PENCIL SMOKE EVACUATOR (MISCELLANEOUS) ×1 IMPLANT
RELOAD PROXIMATE 75MM BLUE (ENDOMECHANICALS) ×6 IMPLANT
RELOAD STAPLE 75 3.8 BLU REG (ENDOMECHANICALS) ×3 IMPLANT
RELOAD STAPLER LINE PROX 60 GR (STAPLE) ×2 IMPLANT
RETAINER VISCERAL (MISCELLANEOUS) ×1 IMPLANT
SPONGE T-LAP 18X18 ~~LOC~~+RFID (SPONGE) ×5 IMPLANT
STAPLER PROXIMATE 75MM BLUE (STAPLE) ×1 IMPLANT
STAPLER RELOADABLE 60 GRN THCK (STAPLE) ×1 IMPLANT
SUT ETHILON 2 0 FS 18 (SUTURE) ×1 IMPLANT
SUT MNCRL AB 4-0 PS2 18 (SUTURE) ×2 IMPLANT
SUT NOVA 1 T20/GS 25DT (SUTURE) ×1 IMPLANT
SUT PDS AB 1 TP1 96 (SUTURE) ×2 IMPLANT
SUT PROLENE 2 0 SH DA (SUTURE) ×3 IMPLANT
SUT SILK 2 0 SH (SUTURE) IMPLANT
SUT SILK 2 0 SH CR/8 (SUTURE) ×2 IMPLANT
SUT SILK 3 0 SH CR/8 (SUTURE) ×1 IMPLANT
SUT SILK 3-0 18XBRD TIE 12 (SUTURE) ×2 IMPLANT
SUT VIC AB 0 CT1 27XBRD ANBCTR (SUTURE) ×2 IMPLANT
SUT VIC AB 2-0 SH 27X BRD (SUTURE) ×2 IMPLANT
SUT VIC AB 3-0 SH 27XBRD (SUTURE) ×2 IMPLANT
SUT VIC AB 3-0 SH 8-18 (SUTURE) ×1 IMPLANT
SYR BULB EAR ULCER 3OZ GRN STR (SYRINGE) ×1 IMPLANT
SYR CONTROL 10ML LL (SYRINGE) ×2 IMPLANT
TOWEL GREEN STERILE (TOWEL DISPOSABLE) ×1 IMPLANT
TOWEL GREEN STERILE FF (TOWEL DISPOSABLE) ×2 IMPLANT
TRAY FOLEY MTR SLVR 14FR STAT (SET/KITS/TRAYS/PACK) ×1 IMPLANT
TUBE CONNECTING 20X1/4 (TUBING) ×1 IMPLANT
YANKAUER SUCT BULB TIP NO VENT (SUCTIONS) ×1 IMPLANT

## 2024-06-26 NOTE — Op Note (Signed)
 06/26/2024  5:22 PM  PATIENT:  Jordan [name]  43 y.o. male  PRE-OPERATIVE DIAGNOSIS:  bowel obstruction  POST-OPERATIVE DIAGNOSIS:  bowel obstruction  PROCEDURE:  Procedure(s) with comments: LAPAROTOMY, EXPLORATORY (N/A) - Lysis of adhesions, small bowel resection, repair of paristomal hernia, revision of decending colostomy  SURGEON:  Surgeons and Role:    * Curvin Deward MOULD, MD - Primary    * Dasie Leonor CROME, MD - Assisting  PHYSICIAN ASSISTANT:   ASSISTANTS: Dr. Dasie   ANESTHESIA:   general  EBL:  100 mL   BLOOD ADMINISTERED:none  DRAINS: (1) Blake drain(s) in the subcutaneous space of the left abdominal wall   LOCAL MEDICATIONS USED:  NONE  SPECIMEN:  Source of Specimen:  Necrotic small bowel and previous ostomy  DISPOSITION OF SPECIMEN:  PATHOLOGY  COUNTS:  YES  TOURNIQUET:  * No tourniquets in log *  DICTATION: .Dragon Dictation  After informed consent was obtained the patient was brought to the operating room and placed in the supine position on the operating room table.  After adequate induction of general anesthesia the patient's abdomen was prepped with ChloraPrep, allowed to dry, and draped in usual sterile manner.  An appropriate timeout was performed.  A midline incision was then made with a 10 blade knife.  The incision was carried through the skin and subcutaneous tissue sharply with the electrocautery until the linea alba was identified.  The linea alba was also incised with the electrocautery.  The preperitoneal space was probed bluntly with a hemostat until the peritoneum was opened and access was gained to the abdominal cavity.  The rest of the incision was then opened under direct vision.  A small serosal injury to the small bowel was identified and repaired with 2-0 silk Lembert stitches.  The small bowel was then run and the patient had a large incarcerated hernia around the stoma on the left abdominal wall.  There was a large area of  necrotic small bowel associated with this.  I open the fascial defect at the ostomy slightly to allow for reduction of the small bowel back into the abdominal cavity.  There was about 2 feet of dead small bowel.  A site above and below the area of dead bowel was chosen where the small bowel appeared healthy.  The mesentery to each site was opened sharply with the electrocautery.  The small bowel was divided at each site with a single firing of a GIA 75 stapler.  The mesentery to the dead small bowel was taken down sharply with the LigaSure.  The proximal and distal segments approximated each other readily.  A small opening was made on the antimesenteric side of each limb of small bowel near the staple line.  Each limb of a GIA 75 stapler was then placed down the appropriate limb of small bowel, clamped, and fired thereby creating a nice widely patent enteroenterostomy.  The common opening was closed with a TA 60 stapler.  The staple line was then imbricated with multiple 2-0 silk Lembert stitches and a 2-0 silk crotch stitch.  Once this was accomplished the anastomosis appeared healthy and widely patent.  The ostomy was noted to be severely retracted and stenotic.  We then dissected the ostomy free from both the inside by some blunt finger dissection and some sharp dissection with the electrocautery.  We also dissected the ostomy free from the outside by excising the skin around the ostomy and dissecting into the subcutaneous tissue until we  could free the ostomy completely.  Once this was accomplished then we brought the ostomy back into the abdominal cavity.  The distal few inches of the ostomy were ischemic and were resected by a single firing of a GIA 75 stapler.  The remaining colonic edge appeared pink and viable.  Next we closed the fascial defect with multiple interrupted #1 Novafil stitches so that the opening was just large enough to allow the colon back through.  I also removed most of the hernia sac from  the subcutaneous tissue of the abdominal wall.  I then placed a drain in the space and brought it out through the left lower quadrant.  The drain was anchored to the skin with a 3-0 nylon stitch.  The descending colonic staple line was then brought through the abdominal wall.  There did not appear to be any tension in the staple line appeared viable.  The abdomen was then irrigated with copious amounts of saline.  The NG tube was confirmed in the stomach.  The fascial incision was then closed with 2 running #1 double-stranded looped PDS sutures.  The subcutaneous tissue was packed with moistened Kerlix gauze.  The ostomy was then matured with interrupted 3-0 Vicryl stitches and an ostomy appliance was applied.  Sterile dressings and drain dressings were applied.  The patient tolerated the procedure well.  At the end of the case all needle sponge and instrument counts were correct.  The patient was then awakened and taken to recovery in stable condition.  The assistant was instrumental for visualization and completion of the case.  PLAN OF CARE: Admit to inpatient   PATIENT DISPOSITION:  PACU - hemodynamically stable.   Delay start of Pharmacological VTE agent (>24hrs) due to surgical blood loss or risk of bleeding: no

## 2024-06-26 NOTE — ED Provider Notes (Addendum)
 [facility] AT [facility] Provider Note   CSN: [id number] Arrival date & time: 06/26/24  0203     Patient presents with: Emesis   Jordan [name] is a 43 y.o. male.  Patient with past medical history significant hypertension, diverticulitis complicated by colovesical fistula status post fistula takedown with anastomosis complicated by dehiscence requiring return to the OR for end ostomy creation in October 2021 presents to the emergency department complaining of severe abdominal pain with nausea and vomiting.  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  He had a small irregular bowel movement first thing this morning and has had no bowel movement since that time.  He has had multiple episodes of emesis with severe nausea and severe abdominal pain.  The patient states the abdominal pain is all over his abdomen.  He complains of abdominal distention.  In November of last year the patient had a parastomal hernia with small bowel obstruction after a similar episode of lifting something heavy.  At that time surgery felt there was no need for urgent surgical intervention but explained that he was at risk for possible intervention if he ends up with an obstruction related to parastomal hernia.  Patient is denying shortness of breath, chest pain, fever, urinary symptoms at this time.    Emesis      Prior to Admission medications   Medication Sig Start Date End Date Taking? Authorizing Provider  albuterol  (VENTOLIN  HFA) 108 (90 Base) MCG/ACT inhaler Inhale 2 puffs into the lungs every 6 (six) hours as needed for wheezing or shortness of breath. 04/28/23   Wallace Joesph LABOR, PA  aspirin 81 MG tablet Take 81 mg by mouth daily.     [provider]  Blood Pressure Monitor DEVI Check bp daily in the am 08/04/23   [name], MD  cetirizine  (ZYRTEC ) 10 MG tablet Take 1 tablet (10  mg total) by mouth daily. 03/23/22   Abonza, Maritza, PA-C  cyclobenzaprine  (FLEXERIL ) 10 MG tablet Take 1 tablet (10 mg total) by mouth 2 (two) times daily as needed for muscle spasms. 05/25/24   Ward, Harlene PEDLAR, PA-C  diclofenac  Sodium (VOLTAREN ) 1 % GEL Apply 2 g topically as needed.    [provider]  oseltamivir  (TAMIFLU ) 75 MG capsule Take 1 capsule (75 mg total) by mouth 2 (two) times daily. Patient not taking: Reported on 05/31/2024 02/15/24   Burnette, Jennifer M, PA-C  polyethylene glycol (MIRALAX  / GLYCOLAX ) 17 g packet Take 17 g by mouth daily. 11/10/23   Cheryle Page, MD  promethazine -dextromethorphan  (PROMETHAZINE -DM) 6.25-15 MG/5ML syrup Take 5 mLs by mouth 4 (four) times daily as needed. Patient not taking: Reported on 05/31/2024 02/15/24   Vivienne Delon HERO, PA-C    Allergies: Butrans [buprenorphine hcl], Nucynta [tapentadol hydrochloride], Glucose polymer, Sumac, and Bactrim  [sulfamethoxazole -trimethoprim ]    Review of Systems  Gastrointestinal:  Positive for vomiting.    Updated Vital Signs BP (!) 196/172   Pulse (!) 48   Temp 98.1 F (36.7 C) (Oral)   Resp 13   Ht 5' 6 (1.676 m)   Wt 126.1 kg   SpO2 100%   BMI 44.87 kg/m   Physical Exam Vitals and nursing note reviewed.  Constitutional:      General: He is not in acute distress.    Appearance: He is well-developed. He is obese.     Comments:  Appears uncomfortable  HENT:     Head: Atraumatic.   Eyes:     Conjunctiva/sclera: Conjunctivae normal.    Cardiovascular:     Rate and Rhythm: Normal rate and regular rhythm.     Heart sounds: Normal heart sounds.  Pulmonary:     Effort: Pulmonary effort is normal.     Breath sounds: Normal breath sounds.  Abdominal:     General: Bowel sounds are decreased.     Palpations: Abdomen is soft.     Tenderness: There is generalized abdominal tenderness.   Musculoskeletal:     Cervical back: Neck supple.   Skin:    Findings: No rash.   Neurological:      Mental Status: He is alert.     (all labs ordered are listed, but only abnormal results are displayed) Labs Reviewed  CBC WITH DIFFERENTIAL/PLATELET - Abnormal; Notable for the following components:      Result Value   RBC 5.98 (*)    Lymphs Abs 4.4 (*)    All other components within normal limits  COMPREHENSIVE METABOLIC PANEL WITH GFR - Abnormal; Notable for the following components:   CO2 19 (*)    Glucose, Bld 157 (*)    Anion gap 19 (*)    All other components within normal limits  LIPASE, BLOOD  URINALYSIS, ROUTINE W REFLEX MICROSCOPIC    EKG: EKG Interpretation Date/Time:  Tuesday June 26 2024 02:58:02 EDT Ventricular Rate:  64 PR Interval:  160 QRS Duration:  122 QT Interval:  469 QTC Calculation: 484 R Axis:   6  Text Interpretation: Sinus arrhythmia IVCD, consider atypical RBBB Confirmed by Midge Golas ([id number]) on 06/26/2024 3:04:11 AM  Radiology: CT ABDOMEN PELVIS W CONTRAST Result Date: 06/26/2024 CLINICAL DATA:  Severe abdominal pain nausea and vomiting. History of small-bowel obstruction. EXAM: CT ABDOMEN AND PELVIS WITH CONTRAST TECHNIQUE: Multidetector CT imaging of the abdomen and pelvis was performed using the standard protocol following bolus administration of intravenous contrast. RADIATION DOSE REDUCTION: This exam was performed according to the departmental dose-optimization program which includes automated exposure control, adjustment of the mA and/or kV according to patient size and/or use of iterative reconstruction technique. CONTRAST:  OMNIPAQUE  IOHEXOL  350 MG/ML SOLN COMPARISON:  11/06/2023 FINDINGS: Lower chest: Unremarkable Hepatobiliary: No suspicious focal abnormality within the liver parenchyma. There is no evidence for gallstones, gallbladder wall thickening, or pericholecystic fluid. No intrahepatic or extrahepatic biliary dilation. Pancreas: No focal mass lesion. No dilatation of the main duct. No intraparenchymal cyst. No  peripancreatic edema. Spleen: No splenomegaly. No suspicious focal mass lesion. Adrenals/Urinary Tract: No adrenal nodule or mass. Kidneys unremarkable. No evidence for hydroureter. The urinary bladder appears normal for the degree of distention. Stomach/Bowel: Stomach is unremarkable. No gastric wall thickening. No evidence of outlet obstruction. Duodenum is normally positioned as is the ligament of Treitz. No small bowel wall thickening. No small bowel dilatation. Cecum is low in the left pelvis. The terminal ileum is normal. The appendix is normal. Right and transverse colon unremarkable. 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 Patient has a relatively large parastomal hernia in the left abdomen containing sigmoid colon and small bowel. Small bowel loops in the hernia sac are dilated in fluid-filled up to 4 cm diameter. Gas between the enteric contents in the mucosal surface is evident (see coronal 105/6) which may be intraluminal gas trapped along the mucosal surface but a degree of pneumatosis is not excluded. There are gas collections in the hernia sac  that cannot be definitely confirmed to be intraluminal although they may reflect markedly decompressed small bowel loops or diverticuli associated with the herniated portion of the sigmoid colon. As an example of possible extraluminal gas, see axial image 46/3 and corresponding sagittal image 57/7. There is edema in fluid in the hernia sac. Vascular/Lymphatic: There is mild atherosclerotic calcification of the abdominal aorta without aneurysm. There is no gastrohepatic or hepatoduodenal ligament lymphadenopathy. No retroperitoneal or mesenteric lymphadenopathy. No pelvic sidewall lymphadenopathy. Reproductive: The prostate gland and seminal vesicles are unremarkable. Other: No intraperitoneal free fluid. Musculoskeletal: No worrisome lytic or sclerotic osseous abnormality. IMPRESSION: 1. Left abdominal sigmoid end colostomy with a  relatively large parastomal hernia in the left abdomen containing sigmoid colon and small bowel. Small bowel loops in the hernia sac are dilated and fluid-filled up to 4 cm diameter. Gas between the enteric contents in the mucosal surface is evident which may be intraluminal gas trapped along the mucosal surface but a degree of pneumatosis is not excluded. There are gas collections in the hernia sac that cannot be definitely confirmed to be intraluminal and while they may reflect markedly decompressed small bowel loops or diverticuli associated with the herniated portion of the sigmoid colon, small volume extraluminal gas is a concern. Imaging features are concerning for small bowel obstruction with possible incarceration and possible strangulation. Descending colon leading into the parastomal hernia is nondilated suggesting no associated colonic obstruction. Surgical consultation recommended. 2.  Aortic Atherosclerosis (ICD10-I70.0). Electronically Signed   By: Camellia Candle M.D.   On: 06/26/2024 05:40     .Critical Care  Performed by: Logan Ubaldo NOVAK, PA-C Authorized by: Logan Ubaldo NOVAK, PA-C   Critical care provider statement:    Critical care time (minutes):  30   Critical care time was exclusive of:  Separately billable procedures and treating other patients   Critical care was time spent personally by me on the following activities:  Development of treatment plan with patient or surrogate, discussions with consultants, evaluation of patient's response to treatment, examination of patient, ordering and review of laboratory studies, ordering and review of radiographic studies, ordering and performing treatments and interventions, pulse oximetry, re-evaluation of patient's condition and review of old charts   Care discussed with: admitting provider      Medications Ordered in the ED  hydrALAZINE  (APRESOLINE ) injection 5 mg (has no administration in time range)  HYDROmorphone  (DILAUDID )  injection 0.5-1 mg (has no administration in time range)  prochlorperazine (COMPAZINE) injection 5 mg (has no administration in time range)  ondansetron  (ZOFRAN ) injection 4 mg (4 mg Intravenous Given 06/26/24 0228)  sodium chloride  0.9 % bolus 1,000 mL (0 mLs Intravenous Stopped 06/26/24 0359)  morphine  (PF) 4 MG/ML injection 4 mg (4 mg Intravenous Given 06/26/24 0302)  HYDROmorphone  (DILAUDID ) injection 1 mg (1 mg Intravenous Given 06/26/24 0408)  promethazine  (PHENERGAN ) 12.5 mg in sodium chloride  0.9 % 50 mL IVPB (0 mg Intravenous Stopped 06/26/24 0614)  iohexol  (OMNIPAQUE ) 350 MG/ML injection 100 mL (100 mLs Intravenous Contrast Given 06/26/24 0457)  HYDROmorphone  (DILAUDID ) injection 2 mg (2 mg Intravenous Given 06/26/24 0523)                                    Medical Decision Making Amount and/or Complexity of Data Reviewed Labs: ordered. Radiology: ordered.  Risk Prescription drug management. Decision regarding hospitalization.   This patient presents to the ED for concern of  abdominal pain, this involves an extensive number of treatment options, and is a complaint that carries with it a high risk of complications and morbidity.  The differential diagnosis includes strangulated hernia, bowel obstruction, colitis, diverticulitis, gastritis, others   Co morbidities / Chronic conditions that complicate the patient evaluation  History of ostomy as noted in HPI, SBO in November of last year   Additional history obtained:  Additional history obtained from EMR External records from outside source obtained and reviewed including previous surgery notes   Lab Tests:  I Ordered, and personally interpreted labs.  The pertinent results include: Bicarb of 19 with an anion gap of 19   Imaging Studies ordered:  I ordered imaging studies including CT abdomen pelvis with contrast I independently visualized and interpreted imaging which showed  1. Left abdominal sigmoid end colostomy with a  relatively large  parastomal hernia in the left abdomen containing sigmoid colon and  small bowel. Small bowel loops in the hernia sac are dilated and  fluid-filled up to 4 cm diameter. Gas between the enteric contents  in the mucosal surface is evident which may be intraluminal gas  trapped along the mucosal surface but a degree of pneumatosis is not  excluded. There are gas collections in the hernia sac that cannot be  definitely confirmed to be intraluminal and while they may reflect  markedly decompressed small bowel loops or diverticuli associated  with the herniated portion of the sigmoid colon, small volume  extraluminal gas is a concern. Imaging features are concerning for  small bowel obstruction with possible incarceration and possible  strangulation. Descending colon leading into the parastomal hernia  is nondilated suggesting no associated colonic obstruction. Surgical  consultation recommended.  2.  Aortic Atherosclerosis   I agree with the radiologist interpretation   Cardiac Monitoring: / EKG:  The patient was maintained on a cardiac monitor.  I personally viewed and interpreted the cardiac monitored which showed an underlying rhythm of: Sinus arrhythmia   Problem List / ED Course / Critical interventions / Medication management   I ordered medication including morphine , Dilaudid , saline bolus, Zofran , Phenergan  Reevaluation of the patient after these medicines showed that the patient improved I have reviewed the patients home medicines and have made adjustments as needed   Consultations Obtained:  I requested consultation with the surgeon, Dr. Stevie,  and discussed lab and imaging findings as well as pertinent plan - they recommend: Admission to medicine, will formal consult this morning  I requested consultation with the hospitalist, Dr. Charlton, who agrees to see the patient for admission   Social Determinants of Health:  Patient is a former  smoker   Test / Admission - Considered:  Patient with severe abdominal pain, nausea, vomiting secondary to small bowel obstruction secondary to peristomal hernia which may be incarcerated/strangulated.  Surgery will consult.  Medicine to admit.  I have ordered an NG tube.  The patient is required multiple doses of pain medication but currently has pain moderately well-controlled.       Final diagnoses:  SBO (small bowel obstruction) [facility])  Peristomal hernia    ED Discharge Orders     None          Logan Ubaldo KATHEE DEVONNA 06/26/24 [id number]    Logan Ubaldo KATHEE, PA-C 06/26/24 9357    Midge Golas, MD 06/26/24 [phone]

## 2024-06-26 NOTE — H&P (View-Only) (Signed)
 Dr. Curvin and I saw this patient this morning.  Unable to reduce parastomal hernia or place NGT currently at the bedside.  Will plan on OR intervention today for reduction and repair of this hernia.  This plan was discussed with the patient and his family member at the bedside.  He understands and is agreeable.    Jordan [name] 8:42 AM 06/26/2024

## 2024-06-26 NOTE — Anesthesia Preprocedure Evaluation (Signed)
 Anesthesia Evaluation  Patient identified by MRN, date of birth, ID band Patient awake    Reviewed: Allergy & Precautions, H&P , NPO status , Patient's Chart, lab work & pertinent test results  Airway Mallampati: II   Neck ROM: full    Dental   Pulmonary asthma , sleep apnea , former smoker   breath sounds clear to auscultation       Cardiovascular hypertension,  Rhythm:regular Rate:Normal     Neuro/Psych  PSYCHIATRIC DISORDERS Anxiety Depression       GI/Hepatic ,GERD  ,,H/o colon surgery   Endo/Other    Class 3 obesity  Renal/GU      Musculoskeletal  (+) Arthritis ,    Abdominal   Peds  Hematology   Anesthesia Other Findings   Reproductive/Obstetrics                             Anesthesia Physical Anesthesia Plan  ASA: 2  Anesthesia Plan: General   Post-op Pain Management:    Induction: Intravenous  PONV Risk Score and Plan: 2 and Ondansetron , Dexamethasone , Midazolam  and Treatment may vary due to age or medical condition  Airway Management Planned: Oral ETT  Additional Equipment:   Intra-op Plan:   Post-operative Plan: Extubation in OR  Informed Consent: I have reviewed the patients History and Physical, chart, labs and discussed the procedure including the risks, benefits and alternatives for the proposed anesthesia with the patient or authorized representative who has indicated his/her understanding and acceptance.     Dental advisory given  Plan Discussed with: CRNA, Anesthesiologist and Surgeon  Anesthesia Plan Comments:        Anesthesia Quick Evaluation

## 2024-06-26 NOTE — Transfer of Care (Signed)
 Immediate Anesthesia Transfer of Care Note  Patient: Jordan [name]  Procedure(s) Performed: LAPAROTOMY, EXPLORATORY (Abdomen)  Patient Location: PACU  Anesthesia Type:General  Level of Consciousness: awake and alert   Airway & Oxygen Therapy: Patient Spontanous Breathing and Patient connected to face mask oxygen  Post-op Assessment: Report given to RN and Post -op Vital signs reviewed and stable  Post vital signs: Reviewed and stable  Last Vitals:  Vitals Value Taken Time  BP 129/102   Temp 98.7   Pulse 124 06/26/24 17:42  Resp 14 06/26/24 17:42  SpO2 96 % 06/26/24 17:42  Vitals shown include unfiled device data.  Last Pain:  Vitals:   06/26/24 1228  TempSrc:   PainSc: 9          Complications: No notable events documented.

## 2024-06-26 NOTE — Consult Note (Signed)
 Reason for Consult:vomiting Referring Provider: Ubaldo Logan Jordan [name] is an 43 y.o. male.  HPI: 43 yo male with history of robotic sigmoid colectomy with anastomosis complicated by leak and underwent robotic takedown of anastomosis and end colostomy creation in 2021 [place]). He has had abdominal pain for the last 2 days. He was vomiting all yesterday and came into the ED. Pain is around his ostomy. He has a known parastomal hernia but is usually able to self reduce.   Past Medical History:  Diagnosis Date   Anxiety and depression    Asthma    Chronic pain syndrome    GERD (gastroesophageal reflux disease)    History of kidney stones    HYPERTENSION, MILD    OSTEOARTHRITIS    Recurrent UTI    Sleep apnea     Past Surgical History:  Procedure Laterality Date   COLON SURGERY     COLONOSCOPY WITH PROPOFOL  N/A 09/30/2020   Procedure: COLONOSCOPY WITH PROPOFOL ;  Surgeon: Jinny Carmine, MD;  Location: [facility] ENDOSCOPY;  Service: Endoscopy;  Laterality: N/A;   JOINT REPLACEMENT     KNEE SURGERY Right    7 total surgeries   LYSIS OF ADHESION  10/15/2020   Procedure: LYSIS OF ADHESION;  Surgeon: Lane Shope, MD;  Location: [facility] ORS;  Service: General;;   REPLACEMENT TOTAL KNEE Right    XI ROBOT ASSISTED DIAGNOSTIC LAPAROSCOPY N/A 10/15/2020   Procedure: XI ROBOT ASSISTED DIAGNOSTIC LAPAROSCOPY, HARTMAN'S;  Surgeon: Lane Shope, MD;  Location: [facility] ORS;  Service: General;  Laterality: N/A;    Family History  Problem Relation Age of Onset   Depression Mother    Dementia Mother    Heart disease Father    Heart attack Father    Diabetes Father    High Cholesterol Father    High blood pressure Father    Heart disease Paternal Grandmother    Heart attack Paternal Grandmother    Diabetes Paternal Grandmother    Heart disease Paternal Grandfather    High Cholesterol Paternal Grandfather    Cancer Maternal Aunt        lung   Hypertension Maternal  Uncle    Cancer Maternal Uncle        lung   Diabetes Paternal Aunt    Heart disease Paternal Uncle    Cancer Paternal Uncle    Heart attack Paternal Uncle    Diabetes Paternal Uncle    High Cholesterol Paternal Uncle     Social History:  reports that he quit smoking about 10 years ago. His smoking use included cigars. He has never been exposed to tobacco smoke. He has never used smokeless tobacco. He reports current alcohol use of about 13.0 standard drinks of alcohol per week. He reports current drug use. Frequency: 7.00 times per week. Drug: Marijuana.  Allergies:  Allergies  Allergen Reactions   Butrans [Buprenorphine Hcl]     Difficulty breathing, n/v   Nucynta [Tapentadol Hydrochloride] Anaphylaxis   Glucose Polymer Other (See Comments)    poison   Sumac     poison   Bactrim  [Sulfamethoxazole -Trimethoprim ] Itching and Rash    Medications: I have reviewed the patient's current medications.  Results for orders placed or performed during the hospital encounter of 06/26/24 (from the past 48 hours)  CBC with Differential     Status: Abnormal   Collection Time: 06/26/24  2:26 AM  Result Value Ref Range   WBC 9.8 4.0 - 10.5 K/uL   RBC 5.98 (  H) 4.22 - 5.81 MIL/uL   Hemoglobin 16.2 13.0 - 17.0 g/dL   HCT 48.3 60.9 - 47.9 %   MCV 86.3 80.0 - 100.0 fL   MCH 27.1 26.0 - 34.0 pg   MCHC 31.4 30.0 - 36.0 g/dL   RDW 85.5 88.4 - 84.4 %   Platelets 333 150 - 400 K/uL   nRBC 0.0 0.0 - 0.2 %   Neutrophils Relative % 45 %   Neutro Abs 4.4 1.7 - 7.7 K/uL   Lymphocytes Relative 44 %   Lymphs Abs 4.4 (H) 0.7 - 4.0 K/uL   Monocytes Relative 7 %   Monocytes Absolute 0.6 0.1 - 1.0 K/uL   Eosinophils Relative 2 %   Eosinophils Absolute 0.2 0.0 - 0.5 K/uL   Basophils Relative 1 %   Basophils Absolute 0.1 0.0 - 0.1 K/uL   Immature Granulocytes 1 %   Abs Immature Granulocytes 0.07 0.00 - 0.07 K/uL    Comment: Performed at [facility], [address] [place], [place] [address]   Comprehensive metabolic panel     Status: Abnormal   Collection Time: 06/26/24  2:26 AM  Result Value Ref Range   Sodium 136 135 - 145 mmol/L   Potassium 3.9 3.5 - 5.1 mmol/L   Chloride 98 98 - 111 mmol/L   CO2 19 (L) 22 - 32 mmol/L   Glucose, Bld 157 (H) 70 - 99 mg/dL    Comment: Glucose reference range applies only to samples taken after fasting for at least 8 hours.   BUN 7 6 - 20 mg/dL   Creatinine, Ser 8.96 0.61 - 1.24 mg/dL   Calcium 9.4 8.9 - 89.6 mg/dL   Total Protein 7.8 6.5 - 8.1 g/dL   Albumin 4.2 3.5 - 5.0 g/dL   AST 29 15 - 41 U/L   ALT 17 0 - 44 U/L   Alkaline Phosphatase 60 38 - 126 U/L   Total Bilirubin 0.8 0.0 - 1.2 mg/dL   GFR, Estimated >39 >39 mL/min    Comment: (NOTE) Calculated using the CKD-EPI Creatinine Equation (2021)    Anion gap 19 (H) 5 - 15    Comment: Performed at [facility], [address], [place], [place] [address]  Lipase, blood     Status: None   Collection Time: 06/26/24  2:26 AM  Result Value Ref Range   Lipase 33 11 - 51 U/L    Comment: Performed at [facility], [address], [place], [place] [address]    PE Blood pressure (!) 196/172, pulse (!) 48, temperature 98.1 F (36.7 C), temperature source Oral, resp. rate 13, height 5' 6 (1.676 m), weight 126.1 kg, SpO2 100%. Constitutional: NAD; conversant; no deformities Eyes: Moist conjunctiva; no lid lag; anicteric; PERRL Neck: Trachea midline; no thyromegaly Lungs: Normal respiratory effort; no tactile fremitus CV: RRR; no palpable thrills; no pitting edema GI: Abd distended, firmness around left sided hernia, tender, unable to reduce; no palpable hepatosplenomegaly MSK: Normal gait; no clubbing/cyanosis Psychiatric: Appropriate affect; alert and oriented x3 Lymphatic: No palpable cervical or axillary lymphadenopathy Skin: No major subcutaneous nodules. Warm and dry   Assessment/Plan: 43 yo male with incarcerated parastomal hernia. I reviewed CT scan showing large  amount of small intestine around the colostomy, having pain issues and unable to easily reduce. -place NG tube -admit to med/surg under hospitalist -if unable to reduce after NG tube decompression will go to surgery  I reviewed last 24 h vitals  and pain scores, last 48 h intake and output, last 24 h labs and trends, and last 24 h imaging results.  This care required high  level of medical decision making.   Jordan [name] Jordan [name] 06/26/2024, 6:40 AM

## 2024-06-26 NOTE — Anesthesia Procedure Notes (Addendum)
 Procedure Name: Intubation Date/Time: 06/26/2024 1:32 PM  Performed by: Atanacio Arland HERO, CRNAPre-anesthesia Checklist: Patient identified, Emergency Drugs available, Suction available and Patient being monitored Patient Re-evaluated:Patient Re-evaluated prior to induction Oxygen Delivery Method: Circle system utilized Preoxygenation: Pre-oxygenation with 100% oxygen Induction Type: IV induction Ventilation: Mask ventilation without difficulty Laryngoscope Size: Miller and 2 Grade View: Grade II Tube type: Oral Tube size: 7.0 mm Number of attempts: 1 Airway Equipment and Method: Stylet and Oral airway Placement Confirmation: ETT inserted through vocal cords under direct vision, positive ETCO2 and breath sounds checked- equal and bilateral Secured at: 23 cm Tube secured with: Tape Dental Injury: Teeth and Oropharynx as per pre-operative assessment  Comments: Placed by Powell Fridge, SRNA

## 2024-06-26 NOTE — Progress Notes (Signed)
 Called for report several times. No answer. Transport sent for patient.

## 2024-06-26 NOTE — H&P (Addendum)
 History and Physical    Jordan [name] FMW:[id number] DOB: 11/29/1981 DOA: 06/26/2024  DOS: the patient was seen and examined on 06/26/2024  PCP: [name], MD   Patient coming from: Home  I have personally briefly reviewed patient's old medical records in [facility] and CareEverywhere  HPI:  Jordan [name] is Neeka Urista 43 y.o. year old male with past medical history of diverticulitis with complicated incarcerated hernia status post colostomy in 2021, hypertension, prediabetes, and reactive airway disease, and obstructive sleep apnea not on home CPAP.  Jordan [name] presents to the [facility], ED with left sided abdominal pain that began yesterday after lifting Jermiah Howton heavy case of water.  Since that time the pain has intensified and he has had episodes of nausea with emesis.  He reports he had Naftula Donahue small bowel movement this morning and has not had any stool since that time.  Also has not had flatus since that time either per his report. He denies palpitations, dyspnea, chest pain, fever, malaise, or chills.  ED Course: On arrival to [facility] ED patient was noted to be hypertensive with blood pressure 186/102, HR 122, RR 24, SpO2 100% on room air.  There is no temperature documented until 0627 this morning and at that time he was afebrile with Soniyah Mcglory temp of 36.7 C.  Labs notable for K3.9, CO2 19, glucose 157, anion gap 19.  CT abdomen pelvis obtained and shows sigmoid end colostomy with large parastomal hernia containing sigmoid colon and small bowel.  Small bowel loops in the hernia are dilated and fluid-filled.  Gas is noted and might be intraluminal however Clever Geraldo degree of pneumatosis cannot be excluded.  Study concerning for small bowel obstruction with possible incarceration and possible strangulation.  He was given morphine , Dilaudid , Zofran , Phenergan , and Hilliard Borges 1 L normal saline bolus.  General surgery consulted by EDP and [facility] contacted for admission.  Review of Systems:  As mentioned in the history of present illness. All other systems reviewed and are negative.   Past Medical History:  Diagnosis Date   Anxiety and depression    Asthma    Chronic pain syndrome    GERD (gastroesophageal reflux disease)    History of kidney stones    HYPERTENSION, MILD    OSTEOARTHRITIS    Recurrent UTI    Sleep apnea     Past Surgical History:  Procedure Laterality Date   COLON SURGERY     COLONOSCOPY WITH PROPOFOL  N/Roxann Vierra 09/30/2020   Procedure: COLONOSCOPY WITH PROPOFOL ;  Surgeon: Jinny Carmine, MD;  Location: [facility] ENDOSCOPY;  Service: Endoscopy;  Laterality: N/Glennis Montenegro;   JOINT REPLACEMENT     KNEE SURGERY Right    7 total surgeries   LYSIS OF ADHESION  10/15/2020   Procedure: LYSIS OF ADHESION;  Surgeon: Lane Shope, MD;  Location: [facility] ORS;  Service: General;;   REPLACEMENT TOTAL KNEE Right    XI ROBOT ASSISTED DIAGNOSTIC LAPAROSCOPY N/Saveah Bahar 10/15/2020   Procedure: XI ROBOT ASSISTED DIAGNOSTIC LAPAROSCOPY, HARTMAN'S;  Surgeon: Lane Shope, MD;  Location: [facility] ORS;  Service: General;  Laterality: N/Gerritt Galentine;     reports that he quit smoking about 10 years ago. His smoking use included cigars. He has never been exposed to tobacco smoke. He has never used smokeless tobacco. He reports current alcohol use of about 13.0 standard drinks of alcohol per week. He reports current drug use. Frequency: 7.00 times per week. Drug: Marijuana.  Allergies  Allergen Reactions   Butrans [Buprenorphine  Hcl]     Difficulty breathing, n/v   Nucynta [Tapentadol Hydrochloride] Anaphylaxis   Glucose Polymer Other (See Comments)    poison   [place]     poison   Bactrim  [Sulfamethoxazole -Trimethoprim ] Itching and Rash    Family History  Problem Relation Age of Onset   Depression Mother    Dementia Mother    Heart disease Father    Heart attack Father    Diabetes Father    High Cholesterol Father    High blood pressure Father    Heart disease Paternal Grandmother    Heart attack  Paternal Grandmother    Diabetes Paternal Grandmother    Heart disease Paternal Grandfather    High Cholesterol Paternal Grandfather    Cancer Maternal Aunt        lung   Hypertension Maternal Uncle    Cancer Maternal Uncle        lung   Diabetes Paternal Aunt    Heart disease Paternal Uncle    Cancer Paternal Uncle    Heart attack Paternal Uncle    Diabetes Paternal Uncle    High Cholesterol Paternal Uncle     Prior to Admission medications   Medication Sig Start Date End Date Taking? Authorizing Provider  albuterol  (VENTOLIN  HFA) 108 (90 Base) MCG/ACT inhaler Inhale 2 puffs into the lungs every 6 (six) hours as needed for wheezing or shortness of breath. 04/28/23   Wallace Joesph LABOR, PA  aspirin 81 MG tablet Take 81 mg by mouth daily.     [provider]  Blood Pressure Monitor DEVI Check bp daily in the am 08/04/23   [name], MD  cetirizine  (ZYRTEC ) 10 MG tablet Take 1 tablet (10 mg total) by mouth daily. 03/23/22   Abonza, Maritza, PA-C  cyclobenzaprine  (FLEXERIL ) 10 MG tablet Take 1 tablet (10 mg total) by mouth 2 (two) times daily as needed for muscle spasms. 05/25/24   Ward, Harlene PEDLAR, PA-C  diclofenac  Sodium (VOLTAREN ) 1 % GEL Apply 2 g topically as needed.    [provider]  oseltamivir  (TAMIFLU ) 75 MG capsule Take 1 capsule (75 mg total) by mouth 2 (two) times daily. Patient not taking: Reported on 05/31/2024 02/15/24   Burnette, Jennifer M, PA-C  polyethylene glycol (MIRALAX  / GLYCOLAX ) 17 g packet Take 17 g by mouth daily. 11/10/23   Cheryle Page, MD  promethazine -dextromethorphan  (PROMETHAZINE -DM) 6.25-15 MG/5ML syrup Take 5 mLs by mouth 4 (four) times daily as needed. Patient not taking: Reported on 05/31/2024 02/15/24   Vivienne Delon HERO, [place]    Physical Exam: Vitals:   06/26/24 0515 06/26/24 0545 06/26/24 0600 06/26/24 0627  BP: (!) 167/101 (!) 206/96 (!) 196/172   Pulse: (!) 54 (!) 48 (!) 48   Resp: 19 13 13    Temp:    98.1 F (36.7 C)   TempSrc:    Oral  SpO2: 100% 99% 100%   Weight:      Height:        Physical Exam    Constitutional: Uncomfortable appearing obese middle-aged African-American gentleman  Eyes: PERRL, lids and conjunctivae normal Respiratory: clear to auscultation bilaterally, no wheezing, no crackles. Normal respiratory effort. No accessory muscle use.  Cardiovascular: Regular rate and rhythm, no murmurs / rubs / gallops. No extremity edema. 2+ radial and pedal pulses.   Abdomen: Distended, firm on left side around the known hernia with associated tenderness. No hepatosplenomegaly. Bowel sounds hypoactive.  Musculoskeletal: No joint deformity upper and lower extremities. Good ROM,  no contractures. Normal muscle tone.  Skin: Warm, dry.  No rashes, lesions, ulcers.  Neurologic: CN 2-12 grossly intact.  Alert and oriented x 3.   Labs on Admission: I have personally reviewed following labs and imaging studies  CBC: Recent Labs  Lab 06/26/24 0226  WBC 9.8  NEUTROABS 4.4  HGB 16.2  HCT 51.6  MCV 86.3  PLT 333   Basic Metabolic Panel: Recent Labs  Lab 06/26/24 0226  NA 136  K 3.9  CL 98  CO2 19*  GLUCOSE 157*  BUN 7  CREATININE 1.03  CALCIUM 9.4   GFR: Estimated Creatinine Clearance: 114.8 mL/min (by C-G formula based on SCr of 1.03 mg/dL). Liver Function Tests: Recent Labs  Lab 06/26/24 0226  AST 29  ALT 17  ALKPHOS 60  BILITOT 0.8  PROT 7.8  ALBUMIN 4.2   Recent Labs  Lab 06/26/24 0226  LIPASE 33   No results for input(s): AMMONIA in the last 168 hours. Coagulation Profile: No results for input(s): INR, PROTIME in the last 168 hours. Cardiac Enzymes: No results for input(s): CKTOTAL, CKMB, CKMBINDEX, TROPONINI, TROPONINIHS in the last 168 hours. BNP (last 3 results) No results for input(s): BNP in the last 8760 hours. HbA1C: No results for input(s): HGBA1C in the last 72 hours. CBG: No results for input(s): GLUCAP in the last 168  hours. Lipid Profile: No results for input(s): CHOL, HDL, LDLCALC, TRIG, CHOLHDL, LDLDIRECT in the last 72 hours. Thyroid  Function Tests: No results for input(s): TSH, T4TOTAL, FREET4, T3FREE, THYROIDAB in the last 72 hours. Anemia Panel: No results for input(s): VITAMINB12, FOLATE, FERRITIN, TIBC, IRON, RETICCTPCT in the last 72 hours. Urine analysis:    Component Value Date/Time   COLORURINE YELLOW 11/05/2023 2314   APPEARANCEUR HAZY (Eddy Termine) 11/05/2023 2314   APPEARANCEUR Hazy (Lesle Faron) 09/12/2020 1058   LABSPEC 1.019 11/05/2023 2314   PHURINE 6.0 11/05/2023 2314   GLUCOSEU NEGATIVE 11/05/2023 2314   HGBUR NEGATIVE 11/05/2023 2314   BILIRUBINUR NEGATIVE 11/05/2023 2314   BILIRUBINUR Negative 08/27/2020 1332   KETONESUR 20 (Fatisha Rabalais) 11/05/2023 2314   PROTEINUR 100 (Nicie Milan) 11/05/2023 2314   UROBILINOGEN 0.2 05/04/2020 1400   NITRITE NEGATIVE 11/05/2023 2314   LEUKOCYTESUR NEGATIVE 11/05/2023 2314    Radiological Exams on Admission: I have personally reviewed images CT ABDOMEN PELVIS W CONTRAST Result Date: 06/26/2024 CLINICAL DATA:  Severe abdominal pain nausea and vomiting. History of small-bowel obstruction. EXAM: CT ABDOMEN AND PELVIS WITH CONTRAST TECHNIQUE: Multidetector CT imaging of the abdomen and pelvis was performed using the standard protocol following bolus administration of intravenous contrast. RADIATION DOSE REDUCTION: This exam was performed according to the departmental dose-optimization program which includes automated exposure control, adjustment of the mA and/or kV according to patient size and/or use of iterative reconstruction technique. CONTRAST:  OMNIPAQUE  IOHEXOL  350 MG/ML SOLN COMPARISON:  11/06/2023 FINDINGS: Lower chest: Unremarkable Hepatobiliary: No suspicious focal abnormality within the liver parenchyma. There is no evidence for gallstones, gallbladder wall thickening, or pericholecystic fluid. No intrahepatic or extrahepatic biliary  dilation. Pancreas: No focal mass lesion. No dilatation of the main duct. No intraparenchymal cyst. No peripancreatic edema. Spleen: No splenomegaly. No suspicious focal mass lesion. Adrenals/Urinary Tract: No adrenal nodule or mass. Kidneys unremarkable. No evidence for hydroureter. The urinary bladder appears normal for the degree of distention. Stomach/Bowel: Stomach is unremarkable. No gastric wall thickening. No evidence of outlet obstruction. Duodenum is normally positioned as is the ligament of Treitz. No small bowel wall thickening. No small bowel dilatation. Cecum  is low in the left pelvis. The terminal ileum is normal. The appendix is normal. Right and transverse colon unremarkable. 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 Patient has Hershell Brandl relatively large parastomal hernia in the left abdomen containing sigmoid colon and small bowel. Small bowel loops in the hernia sac are dilated in fluid-filled up to 4 cm diameter. Gas between the enteric contents in the mucosal surface is evident (see coronal 105/6) which may be intraluminal gas trapped along the mucosal surface but Teila Skalsky degree of pneumatosis is not excluded. There are gas collections in the hernia sac that cannot be definitely confirmed to be intraluminal although they may reflect markedly decompressed small bowel loops or diverticuli associated with the herniated portion of the sigmoid colon. As an example of possible extraluminal gas, see axial image 46/3 and corresponding sagittal image 57/7. There is edema in fluid in the hernia sac. Vascular/Lymphatic: There is mild atherosclerotic calcification of the abdominal aorta without aneurysm. There is no gastrohepatic or hepatoduodenal ligament lymphadenopathy. No retroperitoneal or mesenteric lymphadenopathy. No pelvic sidewall lymphadenopathy. Reproductive: The prostate gland and seminal vesicles are unremarkable. Other: No intraperitoneal free fluid. Musculoskeletal: No worrisome  lytic or sclerotic osseous abnormality. IMPRESSION: 1. Left abdominal sigmoid end colostomy with Aliyah Abeyta relatively large parastomal hernia in the left abdomen containing sigmoid colon and small bowel. Small bowel loops in the hernia sac are dilated and fluid-filled up to 4 cm diameter. Gas between the enteric contents in the mucosal surface is evident which may be intraluminal gas trapped along the mucosal surface but Brant Peets degree of pneumatosis is not excluded. There are gas collections in the hernia sac that cannot be definitely confirmed to be intraluminal and while they may reflect markedly decompressed small bowel loops or diverticuli associated with the herniated portion of the sigmoid colon, small volume extraluminal gas is Khyrin Trevathan concern. Imaging features are concerning for small bowel obstruction with possible incarceration and possible strangulation. Descending colon leading into the parastomal hernia is nondilated suggesting no associated colonic obstruction. Surgical consultation recommended. 2.  Aortic Atherosclerosis (ICD10-I70.0). Electronically Signed   By: Camellia Candle M.D.   On: 06/26/2024 05:40    EKG: My personal interpretation of EKG shows: Normal sinus rhythm, HR 64 with incomplete right bundle branch block/IVCD   Assessment/Plan Principal Problem:   SBO (small bowel obstruction) (HCC)   #Small Bowel Obstruction CT Abdomen concerning for small bowel obstruction with possible incarceration and possible strangulation. Lactic acid ordered and pending collection, WBC 9.8.  Patient has continued nausea and significant left-sided abdominal pain involving the left upper quadrant and left lower quadrant. Hx of robotic sigmoid colectomy with anastomosis complicated by leak and underwent robotic takedown of anastomosis and colostomy creation 2021.  He has Alvah Lagrow known parastomal hernia that he is normally able to self reduce. - Keep NPO (small amount of ice chips for patient comfort OK)  - NGT to LIWS - MIVF  at 100 mL/hr - Check Lactic acid - Daily BMP + Mg - PRN electrolyte correction for goal K >4 and Mg >2 - Analgesia as needed - PRN antiemetics - General surgery consulted, plan to take to the OR today  ##Hypertensive urgency #Hx of Hypertension Patient reports he does not take any medications outpatient - PRN analgesia - PRN hydralazine  - suspect primarily related to pain - treat pain before giving prn meds for blood pressure  ##Increased anion gap metabolic acidosis - 1/2 amp bicarb - Treat SBO as above - Repeat metabolic panel with  Jonique Kulig.m. labs - due to lactic acidosis  #Prediabetes - Check A1c - q4H CBG monitoring while NPO  #Obstructive sleep apnea Not on home CPAP - qHS Supplemental O2  VTE prophylaxis:  Lovenox   GI prophylaxis: Not indicated  Diet: N.p.o. Access: PIV Lines: None Code Status:  Full Code Telemetry: Yes  Admission status: Inpatient, progressive Patient is from: Home Anticipated d/c is to: Home Anticipated d/c date is: 3-4 days Patient currently: Pending general surgery consultation, symptomatic improvement, and resolution of obstruction    Family Communication: Wife at bedside  Consults called: General surgery consulted by EDP   Severity of Illness: The appropriate patient status for this patient is INPATIENT. Inpatient status is judged to be reasonable and necessary in order to provide the required intensity of service to ensure the patient's safety. The patient's presenting symptoms, physical exam findings, and initial radiographic and laboratory data in the context of their chronic comorbidities is felt to place them at high risk for further clinical deterioration. Furthermore, it is not anticipated that the patient will be medically stable for discharge from the hospital within 2 midnights of admission.   * I certify that at the point of admission it is my clinical judgment that the patient will require inpatient hospital care spanning beyond 2  midnights from the point of admission due to high intensity of service, high risk for further deterioration and high frequency of surveillance required.*  To reach the provider On-Call:   7AM- 7PM see care teams to locate the attending and reach out to them via [email or web address] Password: [email or web address] 7PM-7AM contact night-coverage If you still have difficulty reaching the appropriate provider, please page the [facility] (Director on Call) for [facility] on amion for assistance  This document was prepared using Conservation officer, historic buildings and may include unintentional dictation errors.  Rockie Rams FNP-BC, PMHNP-BC Nurse Practitioner [facility]

## 2024-06-26 NOTE — Progress Notes (Signed)
 Dr. Curvin and I saw this patient this morning.  Unable to reduce parastomal hernia or place NGT currently at the bedside.  Will plan on OR intervention today for reduction and repair of this hernia.  This plan was discussed with the patient and his family member at the bedside.  He understands and is agreeable.    Burnard FORBES Banter 8:42 AM 06/26/2024

## 2024-06-26 NOTE — Interval H&P Note (Signed)
 History and Physical Interval Note:  06/26/2024 12:56 PM  Jordan [name]  has presented today for surgery, with the diagnosis of bowel obstruction.  The various methods of treatment have been discussed with the patient and family. After consideration of risks, benefits and other options for treatment, the patient has consented to  Procedure(s): REPAIR, HERNIA, PARASTOMAL (N/A) as a surgical intervention.  The patient's history has been reviewed, patient examined, no change in status, stable for surgery.  I have reviewed the patient's chart and labs.  Questions were answered to the patient's satisfaction.     Deward Null [name]

## 2024-06-26 NOTE — ED Triage Notes (Signed)
 Patient c/o severe abd pain and n/v.  Patient actively vomiting in triage and diaphoretic.  Patient has a history of SBO and states this feels the same.  Patient gives verbal consent for MSE.

## 2024-06-27 ENCOUNTER — Encounter (HOSPITAL_COMMUNITY): Payer: Self-pay | Admitting: General Surgery

## 2024-06-27 DIAGNOSIS — I160 Hypertensive urgency: Secondary | ICD-10-CM | POA: Diagnosis not present

## 2024-06-27 DIAGNOSIS — R7303 Prediabetes: Secondary | ICD-10-CM | POA: Diagnosis not present

## 2024-06-27 DIAGNOSIS — K56609 Unspecified intestinal obstruction, unspecified as to partial versus complete obstruction: Secondary | ICD-10-CM | POA: Diagnosis not present

## 2024-06-27 DIAGNOSIS — E8729 Other acidosis: Secondary | ICD-10-CM

## 2024-06-27 LAB — CBC
HCT: 45.5 % (ref 39.0–52.0)
Hemoglobin: 14.6 g/dL (ref 13.0–17.0)
MCH: 27.8 pg (ref 26.0–34.0)
MCHC: 32.1 g/dL (ref 30.0–36.0)
MCV: 86.7 fL (ref 80.0–100.0)
Platelets: 267 10*3/uL (ref 150–400)
RBC: 5.25 MIL/uL (ref 4.22–5.81)
RDW: 14.8 % (ref 11.5–15.5)
WBC: 20.4 10*3/uL — ABNORMAL HIGH (ref 4.0–10.5)
nRBC: 0 % (ref 0.0–0.2)

## 2024-06-27 LAB — BASIC METABOLIC PANEL WITH GFR
Anion gap: 11 (ref 5–15)
BUN: 5 mg/dL — ABNORMAL LOW (ref 6–20)
CO2: 27 mmol/L (ref 22–32)
Calcium: 8.4 mg/dL — ABNORMAL LOW (ref 8.9–10.3)
Chloride: 106 mmol/L (ref 98–111)
Creatinine, Ser: 1.06 mg/dL (ref 0.61–1.24)
GFR, Estimated: >60 mL/min (ref >60)
Glucose, Bld: 149 mg/dL — ABNORMAL HIGH (ref 70–99)
Potassium: 4 mmol/L (ref 3.5–5.1)
Sodium: 144 mmol/L (ref 135–145)

## 2024-06-27 LAB — GLUCOSE, CAPILLARY
Glucose-Capillary: 136 mg/dL — ABNORMAL HIGH (ref 70–99)
Glucose-Capillary: 122 mg/dL — ABNORMAL HIGH (ref 70–99)
Glucose-Capillary: 128 mg/dL — ABNORMAL HIGH (ref 70–99)

## 2024-06-27 LAB — MAGNESIUM: Magnesium: 2 mg/dL (ref 1.7–2.4)

## 2024-06-27 LAB — LACTIC ACID, PLASMA: Lactic Acid, Venous: 1.7 mmol/L (ref 0.5–1.9)

## 2024-06-27 MED ORDER — CHLORHEXIDINE GLUCONATE CLOTH 2 % EX PADS
6.0000 | MEDICATED_PAD | Freq: Every day | CUTANEOUS | Status: DC
  Administered 2024-06-27 – 2024-07-08 (×12): 6 via TOPICAL

## 2024-06-27 MED ORDER — METOPROLOL TARTRATE 5 MG/5ML IV SOLN
5.0000 mg | Freq: Four times a day (QID) | INTRAVENOUS | Status: DC | PRN
  Administered 2024-06-27: 5 mg via INTRAVENOUS
  Filled 2024-06-27: qty 5

## 2024-06-27 MED ORDER — LACTATED RINGERS IV SOLN: INTRAVENOUS | Status: AC

## 2024-06-27 MED ORDER — ORAL CARE MOUTH RINSE
15.0000 mL | OROMUCOSAL | Status: DC | PRN
Start: 2024-06-27 — End: 2024-07-08

## 2024-06-27 MED ORDER — PIPERACILLIN-TAZOBACTAM 3.375 G IVPB
3.3750 g | Freq: Three times a day (TID) | INTRAVENOUS | Status: AC
  Administered 2024-06-27 – 2024-07-02 (×15): 3.375 g via INTRAVENOUS
  Filled 2024-06-27 (×16): qty 50

## 2024-06-27 NOTE — Plan of Care (Signed)
  Problem: Clinical Measurements: Goal: Diagnostic test results will improve Outcome: Progressing   Problem: Activity: Goal: Risk for activity intolerance will decrease Outcome: Progressing   Problem: Nutrition: Goal: Adequate nutrition will be maintained Outcome: Progressing   Problem: Elimination: Goal: Will not experience complications related to urinary retention Outcome: Progressing

## 2024-06-27 NOTE — Progress Notes (Signed)
 1 Day Post-Op  Subjective: CC: Patient in bed with WOC nurses and patient's life partner at bedside. Patient denies having a BM but endorses large amount of flatus. Pain is as expected post-op and appears tolerable. Patient HDS and in NAD.   Objective: Vital signs in last 24 hours: Temp:  [97.4 F (36.3 C)-98.9 F (37.2 C)] 98.3 F (36.8 C) (07/02 0803) Pulse Rate:  [60-134] 108 (07/02 0803) Resp:  [10-33] 17 (07/02 0832) BP: (98-204)/(79-117) 138/89 (07/02 0803) SpO2:  [95 %-100 %] 96 % (07/02 0832) FiO2 (%):  [24 %] 24 % (07/02 0832) Weight:  [126.1 kg] 126.1 kg (07/01 1140) Last BM Date : 06/26/24 (stool from colostomy)  Intake/Output from previous day: 07/01 0701 - 07/02 0700 In: 4835.9 [I.V.:4134.2; IV Piggyback:701.7] Out: 4140 [Urine:3175; Emesis/NG output:550; Drains:165; Blood:125] Intake/Output this shift: No intake/output data recorded.  PE: Physical Exam Constitutional:      General: He is not in acute distress.    Appearance: He is not diaphoretic.  Pulmonary:     Effort: Pulmonary effort is normal.  Abdominal:     General: There is distension.     Palpations: There is no mass.     Tenderness: There is abdominal tenderness.     Hernia: No hernia is present.     Comments: New ostomy bag placed, surgical wound with bandage that appears clean without evidence of active bleeding.   Skin:    General: Skin is warm and dry.  Neurological:     General: No focal deficit present.     Mental Status: He is alert and oriented to person, place, and time.     Lab Results:  Recent Labs    06/26/24 0226 06/26/24 1639 06/26/24 1701 06/27/24 0630  WBC 9.8  --   --  20.4*  HGB 16.2   < > 16.0 14.6  HCT 51.6   < > 47.0 45.5  PLT 333  --   --  267   < > = values in this interval not displayed.   BMET Recent Labs    06/26/24 0226 06/26/24 1639 06/26/24 1701 06/27/24 0630  NA 136   < > 141 144  K 3.9   < > 4.5 4.0  CL 98  --   --  106  CO2 19*  --   --   27  GLUCOSE 157*  --   --  149*  BUN 7  --   --  5*  CREATININE 1.03  --   --  1.06  CALCIUM 9.4  --   --  8.4*   < > = values in this interval not displayed.   PT/INR No results for input(s): LABPROT, INR in the last 72 hours. CMP     Component Value Date/Time   NA 144 06/27/2024 0630   NA 140 02/06/2024 0850   K 4.0 06/27/2024 0630   CL 106 06/27/2024 0630   CO2 27 06/27/2024 0630   GLUCOSE 149 (H) 06/27/2024 0630   BUN 5 (L) 06/27/2024 0630   BUN 9 02/06/2024 0850   CREATININE 1.06 06/27/2024 0630   CALCIUM 8.4 (L) 06/27/2024 0630   PROT 7.8 06/26/2024 0226   PROT 6.5 02/06/2024 0850   ALBUMIN 4.2 06/26/2024 0226   ALBUMIN 4.1 02/06/2024 0850   AST 29 06/26/2024 0226   ALT 17 06/26/2024 0226   ALKPHOS 60 06/26/2024 0226   BILITOT 0.8 06/26/2024 0226   BILITOT 0.3 02/06/2024 0850   GFRNONAA >60  06/27/2024 0630   GFRAA >60 09/29/2020 0901   Lipase     Component Value Date/Time   LIPASE 33 06/26/2024 0226    Studies/Results: CT ABDOMEN PELVIS W CONTRAST Result Date: 06/26/2024 CLINICAL DATA:  Severe abdominal pain nausea and vomiting. History of small-bowel obstruction. EXAM: CT ABDOMEN AND PELVIS WITH CONTRAST TECHNIQUE: Multidetector CT imaging of the abdomen and pelvis was performed using the standard protocol following bolus administration of intravenous contrast. RADIATION DOSE REDUCTION: This exam was performed according to the departmental dose-optimization program which includes automated exposure control, adjustment of the mA and/or kV according to patient size and/or use of iterative reconstruction technique. CONTRAST:  OMNIPAQUE  IOHEXOL  350 MG/ML SOLN COMPARISON:  11/06/2023 FINDINGS: Lower chest: Unremarkable Hepatobiliary: No suspicious focal abnormality within the liver parenchyma. There is no evidence for gallstones, gallbladder wall thickening, or pericholecystic fluid. No intrahepatic or extrahepatic biliary dilation. Pancreas: No focal mass  lesion. No dilatation of the main duct. No intraparenchymal cyst. No peripancreatic edema. Spleen: No splenomegaly. No suspicious focal mass lesion. Adrenals/Urinary Tract: No adrenal nodule or mass. Kidneys unremarkable. No evidence for hydroureter. The urinary bladder appears normal for the degree of distention. Stomach/Bowel: Stomach is unremarkable. No gastric wall thickening. No evidence of outlet obstruction. Duodenum is normally positioned as is the ligament of Treitz. No small bowel wall thickening. No small bowel dilatation. Cecum is low in the left pelvis. The terminal ileum is normal. The appendix is normal. Right and transverse colon unremarkable. 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 Patient has a relatively large parastomal hernia in the left abdomen containing sigmoid colon and small bowel. Small bowel loops in the hernia sac are dilated in fluid-filled up to 4 cm diameter. Gas between the enteric contents in the mucosal surface is evident (see coronal 105/6) which may be intraluminal gas trapped along the mucosal surface but a degree of pneumatosis is not excluded. There are gas collections in the hernia sac that cannot be definitely confirmed to be intraluminal although they may reflect markedly decompressed small bowel loops or diverticuli associated with the herniated portion of the sigmoid colon. As an example of possible extraluminal gas, see axial image 46/3 and corresponding sagittal image 57/7. There is edema in fluid in the hernia sac. Vascular/Lymphatic: There is mild atherosclerotic calcification of the abdominal aorta without aneurysm. There is no gastrohepatic or hepatoduodenal ligament lymphadenopathy. No retroperitoneal or mesenteric lymphadenopathy. No pelvic sidewall lymphadenopathy. Reproductive: The prostate gland and seminal vesicles are unremarkable. Other: No intraperitoneal free fluid. Musculoskeletal: No worrisome lytic or sclerotic osseous  abnormality. IMPRESSION: 1. Left abdominal sigmoid end colostomy with a relatively large parastomal hernia in the left abdomen containing sigmoid colon and small bowel. Small bowel loops in the hernia sac are dilated and fluid-filled up to 4 cm diameter. Gas between the enteric contents in the mucosal surface is evident which may be intraluminal gas trapped along the mucosal surface but a degree of pneumatosis is not excluded. There are gas collections in the hernia sac that cannot be definitely confirmed to be intraluminal and while they may reflect markedly decompressed small bowel loops or diverticuli associated with the herniated portion of the sigmoid colon, small volume extraluminal gas is a concern. Imaging features are concerning for small bowel obstruction with possible incarceration and possible strangulation. Descending colon leading into the parastomal hernia is nondilated suggesting no associated colonic obstruction. Surgical consultation recommended. 2.  Aortic Atherosclerosis (ICD10-I70.0). Electronically Signed   By: Camellia Minus HERO.D.  On: 06/26/2024 05:40    Anti-infectives: Anti-infectives (From admission, onward)    Start     Dose/Rate Route Frequency Ordered Stop   06/27/24 0200  piperacillin -tazobactam (ZOSYN ) IVPB 3.375 g        3.375 g 12.5 mL/hr over 240 Minutes Intravenous Every 8 hours 06/27/24 0113 07/02/24 0559   06/26/24 2130  piperacillin -tazobactam (ZOSYN ) IVPB 3.375 g  Status:  Discontinued        3.375 g 12.5 mL/hr over 240 Minutes Intravenous Every 8 hours 06/26/24 2117 06/27/24 0113   06/26/24 0830  cefoTEtan  (CEFOTAN ) 2 g in sodium chloride  0.9 % 100 mL IVPB        2 g 200 mL/hr over 30 Minutes Intravenous On call to O.R. 06/26/24 0820 06/26/24 1335        Assessment/Plan POD 1- LAPAROTOMY, EXPLORATORY (N/A) - Lysis of adhesions, small bowel resection, repair of paristomal hernia, revision of decending colostomy, Dr. Curvin, 7/1    WBC is 20.4K this AM,  not atypical post op. Lactate is down to 1.7. NGT out put of 550 mL overnight. WOC consulted. Awaiting return of bowel function.   FEN - NPO except with ice chips, NGT VTE - SCD's, Lovenox  ID - Continue IV zosyn  Foley - In place with good urine output.  Plan - Monitor for post-op ileus, continue pain management in a multimodal fashion. Track NGT output.    I reviewed nursing notes, last 24 h vitals and pain scores, last 48 h intake and output, last 24 h labs and trends, and last 24 h imaging results.  This care required moderate level of medical decision making.    LOS: 1 day    Eulah Hammonds, [facility] 06/27/2024, 9:18 AM Please see Amion for pager number during day hours 7:00am-4:30pm

## 2024-06-27 NOTE — Progress Notes (Signed)
 Patient Yellow mews, HR 130, BP 140/102. Kathrin, MD made aware and new orders for IV metoprolol  placed and given. Will reassess HR and BP. Charge Nurse made aware.

## 2024-06-27 NOTE — Anesthesia Postprocedure Evaluation (Signed)
 Anesthesia Post Note  Patient: Jordan [name]  Procedure(s) Performed: LAPAROTOMY, EXPLORATORY (Abdomen)     Patient location during evaluation: PACU Anesthesia Type: General Level of consciousness: awake Pain management: pain level controlled Vital Signs Assessment: post-procedure vital signs reviewed and stable Respiratory status: spontaneous breathing, nonlabored ventilation and respiratory function stable Cardiovascular status: blood pressure returned to baseline and stable Postop Assessment: no apparent nausea or vomiting Anesthetic complications: no   No notable events documented.                  Delon Aisha Arch

## 2024-06-27 NOTE — TOC Initial Note (Signed)
 Transition of Care (TOC) - Initial/Assessment Note   Spoke to patient and wife at bedside.   Prior to admission patient independent. Does have a cane at home. Had colostomy since 2021  No current in home services.   Patient NPO with NGT.   PCP Toribio Slain     Transition of Care Department [facility])  will continue to monitor patient advancement through interdisciplinary progression rounds. If new patient transition needs arise, please place a TOC consult.   Patient Details  Name: Jordan [name] MRN: [id number] Date of Birth: 30-Apr-1981  Transition of Care [facility]) CM/SW Contact:    Stephane Powell Jansky, RN Phone Number: 06/27/2024, 10:34 AM  Clinical Narrative:                   Expected Discharge Plan: Home/Self Care Barriers to Discharge: Continued Medical Work up   Patient Goals and CMS Choice Patient states their goals for this hospitalization and ongoing recovery are:: to return to home   Choice offered to / list presented to : NA      Expected Discharge Plan and Services   Discharge Planning Services: CM Consult   Living arrangements for the past 2 months: Single Family Home                 DME Arranged: N/A DME Agency: NA       HH Arranged: NA HH Agency: NA        Prior Living Arrangements/Services Living arrangements for the past 2 months: Single Family Home Lives with:: Spouse Patient language and need for interpreter reviewed:: Yes Do you feel safe going back to the place where you live?: Yes      Need for Family Participation in Patient Care: Yes (Comment) Care giver support system in place?: Yes (comment) Current home services: DME Criminal Activity/Legal Involvement Pertinent to Current Situation/Hospitalization: No - Comment as needed  Activities of Daily Living      Permission Sought/Granted   Permission granted to share information with : Yes, Verbal Permission Granted  Share Information with NAME: [name]            Emotional Assessment Appearance:: Appears stated age Attitude/Demeanor/Rapport: Engaged Affect (typically observed): Appropriate Orientation: : Oriented to Self, Oriented to Place, Oriented to  Time, Oriented to Situation Alcohol / Substance Use: Not Applicable Psych Involvement: No (comment)  Admission diagnosis:  SBO (small bowel obstruction) (HCC) [K56.609] Peristomal hernia [K46.9] Patient Active Problem List   Diagnosis Date Noted   Hypertensive urgency 06/26/2024   Increased anion gap metabolic acidosis 06/26/2024   Prediabetes 06/26/2024   Rotator cuff arthropathy of right shoulder 02/07/2024   Small bowel obstruction (HCC) 11/06/2023   SBO (small bowel obstruction) (HCC) 11/06/2023   Colostomy present (HCC) 11/28/2020   Status post colostomy (HCC) 11/04/2020   History of diverticulitis of colon 09/09/2020   Reactive airway disease 10/01/2016   Class 3 severe obesity with serious comorbidity and body mass index (BMI) of 45.0 to 49.9 in adult 02/06/2015   OSA (obstructive sleep apnea) 10/24/2014   Anxiety and depression 01/21/2010   CHRONIC PAIN SYNDROME 01/21/2010   Essential hypertension 01/21/2010   Osteoarthritis 01/21/2010   PCP:  Slain Toribio POUR, MD Pharmacy:   [facility] - [place], [place] - [address] [place] [address] Phone: [phone] Fax: [phone]  [id number] - [address], [place] - [address] [place] [address] Phone: [phone] Fax: [phone]  Social Drivers of Health (SDOH) Social History: SDOH Screenings   Food Insecurity: Patient Declined (06/27/2024)  Housing: Patient Declined (06/27/2024)  Transportation Needs: Patient Declined (06/27/2024)  Utilities: Patient Declined (06/27/2024)  Alcohol Screen: Low Risk  (05/31/2024)  Depression (PHQ2-9): Medium Risk (05/31/2024)  Financial Resource Strain: Low Risk  (05/31/2024)  Physical Activity: Insufficiently Active  (05/31/2024)  Social Connections: Moderately Isolated (05/31/2024)  Stress: Stress Concern Present (05/31/2024)  Tobacco Use: Medium Risk (06/26/2024)  Health Literacy: Adequate Health Literacy (05/31/2024)   SDOH Interventions:     Readmission Risk Interventions     No data to display

## 2024-06-27 NOTE — Plan of Care (Signed)

## 2024-06-27 NOTE — Consult Note (Signed)
 WOC Nurse ostomy consult note Stoma type/location: LMQ colostomy Stomal assessment/size: 30 mm oval stoma, retracted below skin level, red, moist, os at center. Peristomal assessment: intact, skin appears darker surrounding the stoma site, abd creasing at 8 o'clock Treatment options for stomal/peristomal skin: 2 inch barrier ring, barrier ring utilized to fill in creasing and create a flat surface, as well as around the stoma. Output no output at time of visit Ostomy pouching: 2pc. Soft convex 2 3/4 inch with barrier ring Education provided:  Spouse and patient are independent with ostomy management, reports patient utilizes Coloplast 2 piece ostomy pouch at home, as well as barrier ring, and barrier extenders.  Patient has a long history of complex pouching and history of leaking around the umbilicus.  Spouse reports they have a supply of Coloplast pouches at home to last 2 months, per patient typically changes pouch every 2 -3 days when at home.  Will order an ostomy belt as well.  Enrolled patient in [employer] DC program: No- utilizes Medical illustrator at home.  WOC team will follow up on Monday if still inpatient.  Thank you,  Doyal Polite, RN, MSN, [facility] WOC Team

## 2024-06-27 NOTE — Progress Notes (Signed)
 PROGRESS NOTE  Kelly Eisler Holcomb III FMW:[id number] DOB: 05-09-1981   PCP: [name], MD  Patient is from: Home.  Lives with wife.  DOA: 06/26/2024 LOS: 1  Chief complaints Chief Complaint  Patient presents with   Emesis     Brief Narrative / Interim history: 43 year old M with PMH of diverticulitis complicated by incarcerated hernia s/p colostomy in 2021, OSA not on CPAP, HTN, morbid obesity, reactive airway disease and prediabetes presented to ED due to left-sided abdominal pain after lifting heavy case of water with associated nausea and vomiting, and admitted with small bowel obstruction with possible incarceration and strangulation in the setting of large parastomal hernia containing sigmoid colon and a small bowel.  Lactic acid elevated to 3.4. Patient was taken to the OR and had ex lap with LOA, small bowel resection,  repair of parastomal hernia and revision of descending colostomy by Dr. Curvin on 7/1.  Postoperatively, started on PCA for pain control.   Subjective: Seen and examined earlier this morning.  No major events overnight or this morning.  Reports significant abdominal pain.  Denies chest pain or shortness of breath.  No ostomy output yet.  Reports some flatus.  Objective: Vitals:   06/27/24 0357 06/27/24 0443 06/27/24 0803 06/27/24 0832  BP: 133/85  138/89   Pulse: 100  (!) 108   Resp: 18 16 18 17   Temp: 98.9 F (37.2 C)  98.3 F (36.8 C)   TempSrc: Oral  Oral   SpO2: 97%  96% 96%  Weight:      Height:        Examination:  GENERAL: No apparent distress.  Nontoxic. HEENT: MMM.  Vision and hearing grossly intact.  NGT in place. NECK: Supple.  No apparent JVD.  RESP:  No IWOB.  Fair aeration bilaterally. CVS:  RRR. Heart sounds normal.  ABD/GI/GU: BS+. Abd soft.  Colostomy in place.  Appropriately tender.  Dressing over laparotomy wound. MSK/EXT:  Moves extremities. No apparent deformity. No edema.  SKIN: no apparent skin lesion or  wound NEURO: AA.  Oriented appropriately.  No apparent focal neuro deficit. PSYCH: Calm. Normal affect.   Consultants:  General surgery  Procedures: 7/1-ex lap with LOA, small bowel resection,  repair of parastomal hernia and revision of descending colostomy by Dr. Curvin  Microbiology summarized: None  Assessment and plan: Small Bowel Obstruction presents with sudden onset lower abdominal pain with nausea and vomiting.  CT suggested SBO with possible incarceration and strangulation in the setting of large parastomal hernia containing sigmoid colon and a small bowel.  Patient was taken to the OR and had ex lap with LOA, small bowel resection,  repair of parastomal hernia and revision of descending colostomy by Dr. Curvin on 7/1 -Postoperative care and pain control per general surgery-n.p.o., ice chips, NGT and IV Zosyn  -Continue PPI while NPO -IV fluid while NPO -Ostomy care   Hypertensive urgency currently normotensive. -Pain control -IV hydralazine  PRN    High anion gap metabolic acidosis: Due to #1.  Resolved.   Hyperglycemia: History of prediabetes but A1c is 5.6%.   Obstructive sleep apnea not on CPAP. -Supplemental oxygen nightly  Leukocytosis likely due to #1 and demargination. - On IV Zosyn  - Continue monitoring  Morbid obesity Body mass index is 44.87 kg/m.           DVT prophylaxis:  enoxaparin  (LOVENOX ) injection 40 mg Start: 06/27/24 1000 SCD's Start: 06/26/24 2118  Code Status: Full code Family Communication: Updated patient's wife at  bedside Level of care: Med-Surg Status is: Inpatient Remains inpatient appropriate because: Small bowel obstruction   Final disposition: To be determined   35 minutes with more than 50% spent in reviewing records, counseling patient/family and coordinating care.   Sch Meds:  Scheduled Meds:  Chlorhexidine  Gluconate Cloth  6 each Topical Q0600   enoxaparin  (LOVENOX ) injection  40 mg Subcutaneous Q24H   morphine     Intravenous Q4H   pantoprazole  (PROTONIX ) IV  40 mg Intravenous QHS   Continuous Infusions:  sodium chloride  Stopped (06/27/24 0254)   piperacillin -tazobactam (ZOSYN )  IV Stopped (06/27/24 0311)   PRN Meds:.albuterol , diphenhydrAMINE  **OR** diphenhydrAMINE , hydrALAZINE , methocarbamol  (ROBAXIN ) injection, naloxone **AND** sodium chloride  flush, ondansetron  **OR** ondansetron  (ZOFRAN ) IV  Antimicrobials: Anti-infectives (From admission, onward)    Start     Dose/Rate Route Frequency Ordered Stop   06/27/24 0200  piperacillin -tazobactam (ZOSYN ) IVPB 3.375 g        3.375 g 12.5 mL/hr over 240 Minutes Intravenous Every 8 hours 06/27/24 0113 07/02/24 0559   06/26/24 2130  piperacillin -tazobactam (ZOSYN ) IVPB 3.375 g  Status:  Discontinued        3.375 g 12.5 mL/hr over 240 Minutes Intravenous Every 8 hours 06/26/24 2117 06/27/24 0113   06/26/24 0830  cefoTEtan  (CEFOTAN ) 2 g in sodium chloride  0.9 % 100 mL IVPB        2 g 200 mL/hr over 30 Minutes Intravenous On call to O.R. 06/26/24 0820 06/26/24 1335        I have personally reviewed the following labs and images: CBC: Recent Labs  Lab 06/26/24 0226 06/26/24 1639 06/26/24 1701 06/27/24 0630  WBC 9.8  --   --  20.4*  NEUTROABS 4.4  --   --   --   HGB 16.2 17.3* 16.0 14.6  HCT 51.6 51.0 47.0 45.5  MCV 86.3  --   --  86.7  PLT 333  --   --  267   BMP &GFR Recent Labs  Lab 06/26/24 0226 06/26/24 1639 06/26/24 1701 06/27/24 0630  NA 136 142 141 144  K 3.9 6.1* 4.5 4.0  CL 98  --   --  106  CO2 19*  --   --  27  GLUCOSE 157*  --   --  149*  BUN 7  --   --  5*  CREATININE 1.03  --   --  1.06  CALCIUM 9.4  --   --  8.4*  MG 2.1  --   --  2.0   Estimated Creatinine Clearance: 111.6 mL/min (by C-G formula based on SCr of 1.06 mg/dL). Liver & Pancreas: Recent Labs  Lab 06/26/24 0226  AST 29  ALT 17  ALKPHOS 60  BILITOT 0.8  PROT 7.8  ALBUMIN 4.2   Recent Labs  Lab 06/26/24 0226  LIPASE 33   No results for  input(s): AMMONIA in the last 168 hours. Diabetic: Recent Labs    06/26/24 0226  HGBA1C 5.6   Recent Labs  Lab 06/27/24 0928  GLUCAP 136*   Cardiac Enzymes: No results for input(s): CKTOTAL, CKMB, CKMBINDEX, TROPONINI in the last 168 hours. No results for input(s): PROBNP in the last 8760 hours. Coagulation Profile: No results for input(s): INR, PROTIME in the last 168 hours. Thyroid  Function Tests: No results for input(s): TSH, T4TOTAL, FREET4, T3FREE, THYROIDAB in the last 72 hours. Lipid Profile: No results for input(s): CHOL, HDL, LDLCALC, TRIG, CHOLHDL, LDLDIRECT in the last 72 hours. Anemia Panel: No results for input(s):  VITAMINB12, FOLATE, FERRITIN, TIBC, IRON, RETICCTPCT in the last 72 hours. Urine analysis:    Component Value Date/Time   COLORURINE YELLOW 11/05/2023 2314   APPEARANCEUR HAZY (A) 11/05/2023 2314   APPEARANCEUR Hazy (A) 09/12/2020 1058   LABSPEC 1.019 11/05/2023 2314   PHURINE 6.0 11/05/2023 2314   GLUCOSEU NEGATIVE 11/05/2023 2314   HGBUR NEGATIVE 11/05/2023 2314   BILIRUBINUR NEGATIVE 11/05/2023 2314   BILIRUBINUR Negative 08/27/2020 1332   KETONESUR 20 (A) 11/05/2023 2314   PROTEINUR 100 (A) 11/05/2023 2314   UROBILINOGEN 0.2 05/04/2020 1400   NITRITE NEGATIVE 11/05/2023 2314   LEUKOCYTESUR NEGATIVE 11/05/2023 2314   Sepsis Labs: Invalid input(s): PROCALCITONIN, LACTICIDVEN  Microbiology: No results found for this or any previous visit (from the past 240 hours).  Radiology Studies: No results found.    Tishara Pizano T. Ruxin Ransome [facility]  If 7PM-7AM, please contact night-coverage [email or web address] 06/27/2024, 11:49 AM

## 2024-06-27 NOTE — Progress Notes (Signed)
 Patient's IV in the right hand infiltrated with LR. Pharmacy made aware, will elevate RUE and apply ice. No complaints of pain or numbness from patient.

## 2024-06-27 NOTE — Progress Notes (Signed)
Pt arrived to the unit.

## 2024-06-28 DIAGNOSIS — I160 Hypertensive urgency: Secondary | ICD-10-CM | POA: Diagnosis not present

## 2024-06-28 DIAGNOSIS — R7303 Prediabetes: Secondary | ICD-10-CM | POA: Diagnosis not present

## 2024-06-28 DIAGNOSIS — E8729 Other acidosis: Secondary | ICD-10-CM | POA: Diagnosis not present

## 2024-06-28 DIAGNOSIS — K56609 Unspecified intestinal obstruction, unspecified as to partial versus complete obstruction: Secondary | ICD-10-CM | POA: Diagnosis not present

## 2024-06-28 LAB — CBC WITH DIFFERENTIAL/PLATELET
Abs Immature Granulocytes: 0.09 10*3/uL — ABNORMAL HIGH (ref 0.00–0.07)
Basophils Absolute: 0.1 10*3/uL (ref 0.0–0.1)
Basophils Relative: 1 %
Eosinophils Absolute: 0 10*3/uL (ref 0.0–0.5)
Eosinophils Relative: 0 %
HCT: 44.4 % (ref 39.0–52.0)
Hemoglobin: 13.3 g/dL (ref 13.0–17.0)
Immature Granulocytes: 1 %
Lymphocytes Relative: 9 %
Lymphs Abs: 1.3 10*3/uL (ref 0.7–4.0)
MCH: 27.4 pg (ref 26.0–34.0)
MCHC: 30 g/dL (ref 30.0–36.0)
MCV: 91.4 fL (ref 80.0–100.0)
Monocytes Absolute: 1.1 10*3/uL — ABNORMAL HIGH (ref 0.1–1.0)
Monocytes Relative: 7 %
Neutro Abs: 12.8 10*3/uL — ABNORMAL HIGH (ref 1.7–7.7)
Neutrophils Relative %: 82 %
Platelets: 225 10*3/uL (ref 150–400)
RBC: 4.86 MIL/uL (ref 4.22–5.81)
RDW: 14.8 % (ref 11.5–15.5)
WBC: 15.3 10*3/uL — ABNORMAL HIGH (ref 4.0–10.5)
nRBC: 0 % (ref 0.0–0.2)

## 2024-06-28 LAB — COMPREHENSIVE METABOLIC PANEL WITH GFR
ALT: 23 U/L (ref 0–44)
AST: 48 U/L — ABNORMAL HIGH (ref 15–41)
Albumin: 2.9 g/dL — ABNORMAL LOW (ref 3.5–5.0)
Alkaline Phosphatase: 60 U/L (ref 38–126)
Anion gap: 12 (ref 5–15)
BUN: 7 mg/dL (ref 6–20)
CO2: 24 mmol/L (ref 22–32)
Calcium: 8.6 mg/dL — ABNORMAL LOW (ref 8.9–10.3)
Chloride: 105 mmol/L (ref 98–111)
Creatinine, Ser: 1.32 mg/dL — ABNORMAL HIGH (ref 0.61–1.24)
GFR, Estimated: >60 mL/min (ref >60)
Glucose, Bld: 119 mg/dL — ABNORMAL HIGH (ref 70–99)
Potassium: 4.7 mmol/L (ref 3.5–5.1)
Sodium: 141 mmol/L (ref 135–145)
Total Bilirubin: 1.1 mg/dL (ref 0.0–1.2)
Total Protein: 6.1 g/dL — ABNORMAL LOW (ref 6.5–8.1)

## 2024-06-28 LAB — GLUCOSE, CAPILLARY
Glucose-Capillary: 110 mg/dL — ABNORMAL HIGH (ref 70–99)
Glucose-Capillary: 111 mg/dL — ABNORMAL HIGH (ref 70–99)
Glucose-Capillary: 111 mg/dL — ABNORMAL HIGH (ref 70–99)
Glucose-Capillary: 122 mg/dL — ABNORMAL HIGH (ref 70–99)
Glucose-Capillary: 132 mg/dL — ABNORMAL HIGH (ref 70–99)
Glucose-Capillary: 97 mg/dL (ref 70–99)

## 2024-06-28 LAB — SURGICAL PATHOLOGY

## 2024-06-28 LAB — MAGNESIUM: Magnesium: 2.1 mg/dL (ref 1.7–2.4)

## 2024-06-28 LAB — PHOSPHORUS: Phosphorus: 3 mg/dL (ref 2.5–4.6)

## 2024-06-28 MED ORDER — DAKINS (1/4 STRENGTH) 0.125 % EX SOLN
Freq: Two times a day (BID) | CUTANEOUS | Status: AC
  Filled 2024-06-28: qty 473

## 2024-06-28 MED ORDER — SODIUM CHLORIDE 0.9 % IR SOLN: 1000.0000 mL | Freq: Three times a day (TID) | Status: DC

## 2024-06-28 MED ORDER — LACTATED RINGERS IV SOLN: INTRAVENOUS | Status: AC

## 2024-06-28 NOTE — Progress Notes (Signed)
 PROGRESS NOTE  Jordan [name] FMW:[id number] DOB: May 11, 1981   PCP: [name], MD  Patient is from: Home.  Lives with wife.  DOA: 06/26/2024 LOS: 2  Chief complaints Chief Complaint  Patient presents with   Emesis     Brief Narrative / Interim history: 43 year old M with PMH of diverticulitis complicated by incarcerated hernia s/p colostomy in 2021, OSA not on CPAP, HTN, morbid obesity, reactive airway disease and prediabetes presented to ED due to left-sided abdominal pain after lifting heavy case of water with associated nausea and vomiting, and admitted with small bowel obstruction with possible incarceration and strangulation in the setting of large parastomal hernia containing sigmoid colon and a small bowel.  Lactic acid elevated to 3.4. Patient was taken to the OR and had ex lap with LOA, small bowel resection,  repair of parastomal hernia and revision of descending colostomy by Dr. Curvin on 7/1.  Postoperatively, started on PCA for pain control.   Subjective: Seen and examined earlier this morning.  No major events overnight or this morning.  Reports feeling better this morning.  Pain improved.  Able to move his arms and legs which was not the case yesterday.  Passing gas but no BM.  Objective: Vitals:   06/28/24 0838 06/28/24 0854 06/28/24 1224 06/28/24 1300  BP:  128/85    Pulse:  (!) 105    Resp: 15 18 18 16   Temp:  98 F (36.7 C)    TempSrc:  Oral    SpO2: 98% 99% 99% 99%  Weight:      Height:        Examination:  GENERAL: No apparent distress.  Nontoxic. HEENT: MMM.  Vision and hearing grossly intact.  NGT in place. NECK: Supple.  No apparent JVD.  RESP:  No IWOB.  Fair aeration bilaterally. CVS:  RRR. Heart sounds normal.  ABD/GI/GU: BS+. Abd soft.  Colostomy in place.  Appropriately tender.  Dressing over laparotomy wound. MSK/EXT:  Moves extremities. No apparent deformity. No edema.  SKIN: no apparent skin lesion or wound NEURO: AA.   Oriented appropriately.  No apparent focal neuro deficit. PSYCH: Calm. Normal affect.   Consultants:  General surgery  Procedures: 7/1-ex lap with LOA, small bowel resection,  repair of parastomal hernia and revision of descending colostomy by Dr. Curvin  Microbiology summarized: None  Assessment and plan: Small Bowel Obstruction presents with sudden onset lower abdominal pain with nausea and vomiting.  CT suggested SBO with possible incarceration and strangulation in the setting of large parastomal hernia containing sigmoid colon and a small bowel.  Patient was taken to the OR and had ex lap with LOA, small bowel resection,  repair of parastomal hernia and revision of descending colostomy by Dr. Curvin on 7/1 -Postoperative care and pain control per general surgery-n.p.o., ice chips, NGT and IV Zosyn  -Continue PPI while NPO -IV fluid while NPO -Ostomy care   Hypertensive urgency/sinus tachycardia: BP improved. -Pain control -IV hydralazine  PRN elevated BP -IV metoprolol  as needed for tachycardia  AKI: b/l Cr 0.9-1.0. Recent Labs    08/09/23 0910 11/05/23 2317 11/06/23 0424 11/07/23 0723 11/08/23 9375 11/09/23 0533 02/06/24 0850 06/26/24 0226 06/27/24 0630 06/28/24 0635  BUN 6 <5* <5* 7 7 5* 9 7 5* 7  CREATININE 0.85 1.09 0.94 1.00 1.07 0.94 0.90 1.03 1.06 1.32*  -Resume IV fluid - Recheck in the morning    High anion gap metabolic acidosis: Due to #1.  Resolved.   Hyperglycemia: History of  prediabetes but A1c is 5.6%.   Obstructive sleep apnea not on CPAP. -Supplemental oxygen nightly  Leukocytosis likely due to #1 and demargination.  Improved. - On IV Zosyn  - Continue monitoring  Morbid obesity Body mass index is 44.87 kg/m.           DVT prophylaxis:  enoxaparin  (LOVENOX ) injection 40 mg Start: 06/27/24 1000 SCD's Start: 06/26/24 2118  Code Status: Full code Family Communication: None at bedside today. Level of care: Med-Surg Status is:  Inpatient Remains inpatient appropriate because: Small bowel obstruction   Final disposition: To be determined   35 minutes with more than 50% spent in reviewing records, counseling patient/family and coordinating care.   Sch Meds:  Scheduled Meds:  Chlorhexidine  Gluconate Cloth  6 each Topical Q0600   enoxaparin  (LOVENOX ) injection  40 mg Subcutaneous Q24H   morphine    Intravenous Q4H   pantoprazole  (PROTONIX ) IV  40 mg Intravenous QHS   sodium hypochlorite   Irrigation BID   Continuous Infusions:  piperacillin -tazobactam (ZOSYN )  IV 3.375 g (06/28/24 0559)   PRN Meds:.albuterol , diphenhydrAMINE  **OR** diphenhydrAMINE , hydrALAZINE , methocarbamol  (ROBAXIN ) injection, metoprolol  tartrate, naloxone **AND** sodium chloride  flush, ondansetron  **OR** ondansetron  (ZOFRAN ) IV, mouth rinse  Antimicrobials: Anti-infectives (From admission, onward)    Start     Dose/Rate Route Frequency Ordered Stop   06/27/24 0200  piperacillin -tazobactam (ZOSYN ) IVPB 3.375 g        3.375 g 12.5 mL/hr over 240 Minutes Intravenous Every 8 hours 06/27/24 0113 07/02/24 0559   06/26/24 2130  piperacillin -tazobactam (ZOSYN ) IVPB 3.375 g  Status:  Discontinued        3.375 g 12.5 mL/hr over 240 Minutes Intravenous Every 8 hours 06/26/24 2117 06/27/24 0113   06/26/24 0830  cefoTEtan  (CEFOTAN ) 2 g in sodium chloride  0.9 % 100 mL IVPB        2 g 200 mL/hr over 30 Minutes Intravenous On call to O.R. 06/26/24 0820 06/26/24 1335        I have personally reviewed the following labs and images: CBC: Recent Labs  Lab 06/26/24 0226 06/26/24 1639 06/26/24 1701 06/27/24 0630 06/28/24 0635  WBC 9.8  --   --  20.4* 15.3*  NEUTROABS 4.4  --   --   --  12.8*  HGB 16.2 17.3* 16.0 14.6 13.3  HCT 51.6 51.0 47.0 45.5 44.4  MCV 86.3  --   --  86.7 91.4  PLT 333  --   --  267 225   BMP &GFR Recent Labs  Lab 06/26/24 0226 06/26/24 1639 06/26/24 1701 06/27/24 0630 06/28/24 0635  NA 136 142 141 144 141  K  3.9 6.1* 4.5 4.0 4.7  CL 98  --   --  106 105  CO2 19*  --   --  27 24  GLUCOSE 157*  --   --  149* 119*  BUN 7  --   --  5* 7  CREATININE 1.03  --   --  1.06 1.32*  CALCIUM 9.4  --   --  8.4* 8.6*  MG 2.1  --   --  2.0 2.1  PHOS  --   --   --   --  3.0   Estimated Creatinine Clearance: 89.6 mL/min (A) (by C-G formula based on SCr of 1.32 mg/dL (H)). Liver & Pancreas: Recent Labs  Lab 06/26/24 0226 06/28/24 0635  AST 29 48*  ALT 17 23  ALKPHOS 60 60  BILITOT 0.8 1.1  PROT 7.8 6.1*  ALBUMIN  4.2 2.9*   Recent Labs  Lab 06/26/24 0226  LIPASE 33   No results for input(s): AMMONIA in the last 168 hours. Diabetic: Recent Labs    06/26/24 0226  HGBA1C 5.6   Recent Labs  Lab 06/27/24 2116 06/28/24 0022 06/28/24 0546 06/28/24 0852 06/28/24 1231  GLUCAP 128* 111* 132* 122* 97   Cardiac Enzymes: No results for input(s): CKTOTAL, CKMB, CKMBINDEX, TROPONINI in the last 168 hours. No results for input(s): PROBNP in the last 8760 hours. Coagulation Profile: No results for input(s): INR, PROTIME in the last 168 hours. Thyroid  Function Tests: No results for input(s): TSH, T4TOTAL, FREET4, T3FREE, THYROIDAB in the last 72 hours. Lipid Profile: No results for input(s): CHOL, HDL, LDLCALC, TRIG, CHOLHDL, LDLDIRECT in the last 72 hours. Anemia Panel: No results for input(s): VITAMINB12, FOLATE, FERRITIN, TIBC, IRON, RETICCTPCT in the last 72 hours. Urine analysis:    Component Value Date/Time   COLORURINE YELLOW 11/05/2023 2314   APPEARANCEUR HAZY (A) 11/05/2023 2314   APPEARANCEUR Hazy (A) 09/12/2020 1058   LABSPEC 1.019 11/05/2023 2314   PHURINE 6.0 11/05/2023 2314   GLUCOSEU NEGATIVE 11/05/2023 2314   HGBUR NEGATIVE 11/05/2023 2314   BILIRUBINUR NEGATIVE 11/05/2023 2314   BILIRUBINUR Negative 08/27/2020 1332   KETONESUR 20 (A) 11/05/2023 2314   PROTEINUR 100 (A) 11/05/2023 2314   UROBILINOGEN 0.2 05/04/2020 1400    NITRITE NEGATIVE 11/05/2023 2314   LEUKOCYTESUR NEGATIVE 11/05/2023 2314   Sepsis Labs: Invalid input(s): PROCALCITONIN, LACTICIDVEN  Microbiology: No results found for this or any previous visit (from the past 240 hours).  Radiology Studies: No results found.    Nyeema Want T. Geniya Fulgham [facility]  If 7PM-7AM, please contact night-coverage [email or web address] 06/28/2024, 2:12 PM

## 2024-06-28 NOTE — Consult Note (Signed)
 WOC Nurse Consult Note: Reason for Consult: placement of NPWT dressing on the midline  Wound type: surgical  Pressure Injury POA: NA Measurement: 20cm x 10cm approximately  Wound bed: clean, subcutaneous tissue Drainage (amount, consistency, odor) when removing the dressing blue-green drainage was noted throughout the layers of kerlix, slight fruity odor Periwound: intact  Dressing procedure/placement/frequency: Contacted [facility] PA Eulah Hammonds, requested to use 1/4% Dakins x 5 days and re-evaluate on Monday for NPWT. Supplies and device left in the room  Explained rationale for using the Dakins to the patient, notified the bedside nurse and the charge nurse of the need for Dakins dressing to be applied when arrives from the pharmacy.   WOC Nurse will follow along with you for continued support with ostomy teaching and care Cigi Bega [facility], RN, [place], CNS, MAINE [phone]

## 2024-06-28 NOTE — Progress Notes (Signed)
 2 Days Post-Op  Subjective: CC: Patient denies having nausea or a BM but endorses large amount of flatus. Pain is as expected post-op and appears tolerable. Patient HDS and in NAD.   Objective: Vital signs in last 24 hours: Temp:  [97.5 F (36.4 C)-98.9 F (37.2 C)] 98 F (36.7 C) (07/03 0854) Pulse Rate:  [101-130] 105 (07/03 0854) Resp:  [14-18] 18 (07/03 0854) BP: (128-148)/(85-102) 128/85 (07/03 0854) SpO2:  [92 %-99 %] 99 % (07/03 0854) FiO2 (%):  [24 %] 24 % (07/03 0838) Last BM Date : 06/26/24 (stool from colostomy)  Intake/Output from previous day: 07/02 0701 - 07/03 0700 In: 1572.7 [I.V.:1427; IV Piggyback:145.7] Out: 1550 [Urine:700; Emesis/NG output:800; Drains:50] Intake/Output this shift: No intake/output data recorded.  PE: Physical Exam Constitutional:      General: He is not in acute distress.    Appearance: He is not diaphoretic.  Pulmonary:     Effort: Pulmonary effort is normal.  Abdominal:     General: There is distension.     Palpations: There is no mass.     Tenderness: There is abdominal tenderness.     Hernia: No hernia is present.     Comments: New ostomy bag placed 7/2, surgical wound with WTD dressing ABD pad that appears clean without evidence of active bleeding. Ostomy with serosanguinous drainage and small volume of gas. No stool noted.   Skin:    General: Skin is warm and dry.  Neurological:     General: No focal deficit present.     Mental Status: He is alert and oriented to person, place, and time.     Lab Results:  Recent Labs    06/27/24 0630 06/28/24 0635  WBC 20.4* 15.3*  HGB 14.6 13.3  HCT 45.5 44.4  PLT 267 225   BMET Recent Labs    06/27/24 0630 06/28/24 0635  NA 144 141  K 4.0 4.7  CL 106 105  CO2 27 24  GLUCOSE 149* 119*  BUN 5* 7  CREATININE 1.06 1.32*  CALCIUM 8.4* 8.6*   PT/INR No results for input(s): LABPROT, INR in the last 72 hours. CMP     Component Value Date/Time   NA 141  06/28/2024 0635   NA 140 02/06/2024 0850   K 4.7 06/28/2024 0635   CL 105 06/28/2024 0635   CO2 24 06/28/2024 0635   GLUCOSE 119 (H) 06/28/2024 0635   BUN 7 06/28/2024 0635   BUN 9 02/06/2024 0850   CREATININE 1.32 (H) 06/28/2024 0635   CALCIUM 8.6 (L) 06/28/2024 0635   PROT 6.1 (L) 06/28/2024 0635   PROT 6.5 02/06/2024 0850   ALBUMIN 2.9 (L) 06/28/2024 0635   ALBUMIN 4.1 02/06/2024 0850   AST 48 (H) 06/28/2024 0635   ALT 23 06/28/2024 0635   ALKPHOS 60 06/28/2024 0635   BILITOT 1.1 06/28/2024 0635   BILITOT 0.3 02/06/2024 0850   GFRNONAA >60 06/28/2024 0635   GFRAA >60 09/29/2020 0901   Lipase     Component Value Date/Time   LIPASE 33 06/26/2024 0226    Studies/Results: No results found.   Anti-infectives: Anti-infectives (From admission, onward)    Start     Dose/Rate Route Frequency Ordered Stop   06/27/24 0200  piperacillin -tazobactam (ZOSYN ) IVPB 3.375 g        3.375 g 12.5 mL/hr over 240 Minutes Intravenous Every 8 hours 06/27/24 0113 07/02/24 0559   06/26/24 2130  piperacillin -tazobactam (ZOSYN ) IVPB 3.375 g  Status:  Discontinued  3.375 g 12.5 mL/hr over 240 Minutes Intravenous Every 8 hours 06/26/24 2117 06/27/24 0113   06/26/24 0830  cefoTEtan  (CEFOTAN ) 2 g in sodium chloride  0.9 % 100 mL IVPB        2 g 200 mL/hr over 30 Minutes Intravenous On call to O.R. 06/26/24 0820 06/26/24 1335        Assessment/Plan POD 2- LAPAROTOMY, EXPLORATORY (N/A) - Lysis of adhesions, small bowel resection, repair of paristomal hernia, revision of decending colostomy, Dr. Curvin, 7/1    WBC is 15.3K this AM, not atypical post op. Lactate is down to 1.7. NGT out put of 800 mL overnight. WOC consulted. Awaiting return of bowel function.   FEN - NPO except with ice chips, NGT to LWIS.  VTE - SCD's, Lovenox  ID - Continue IV zosyn  Foley - In place with good urine output.  Plan - Monitor for post-op ileus, continue pain management in a multimodal fashion. Monitor NGT  output, start wound vac today.   I reviewed nursing notes, last 24 h vitals and pain scores, last 48 h intake and output, last 24 h labs and trends, and last 24 h imaging results.  This care required moderate level of medical decision making.    LOS: 2 days    Eulah Hammonds, [facility] 06/28/2024, 9:32 AM Please see Amion for pager number during day hours 7:00am-4:30pm

## 2024-06-28 NOTE — Plan of Care (Signed)

## 2024-06-28 NOTE — Plan of Care (Signed)
  Problem: Education: Goal: Knowledge of General Education information will improve Description Including pain rating scale, medication(s)/side effects and non-pharmacologic comfort measures Outcome: Progressing   Problem: Health Behavior/Discharge Planning: Goal: Ability to manage health-related needs will improve Outcome: Progressing   Problem: Clinical Measurements: Goal: Will remain free from infection Outcome: Progressing Goal: Respiratory complications will improve Outcome: Progressing Goal: Cardiovascular complication will be avoided Outcome: Progressing   Problem: Nutrition: Goal: Adequate nutrition will be maintained Outcome: Progressing   Problem: Coping: Goal: Level of anxiety will decrease Outcome: Progressing

## 2024-06-29 DIAGNOSIS — I160 Hypertensive urgency: Secondary | ICD-10-CM | POA: Diagnosis not present

## 2024-06-29 DIAGNOSIS — R7303 Prediabetes: Secondary | ICD-10-CM | POA: Diagnosis not present

## 2024-06-29 DIAGNOSIS — K56609 Unspecified intestinal obstruction, unspecified as to partial versus complete obstruction: Secondary | ICD-10-CM | POA: Diagnosis not present

## 2024-06-29 DIAGNOSIS — E8729 Other acidosis: Secondary | ICD-10-CM | POA: Diagnosis not present

## 2024-06-29 LAB — COMPREHENSIVE METABOLIC PANEL WITH GFR
ALT: 20 U/L (ref 0–44)
AST: 32 U/L (ref 15–41)
Albumin: 2.8 g/dL — ABNORMAL LOW (ref 3.5–5.0)
Alkaline Phosphatase: 55 U/L (ref 38–126)
Anion gap: 11 (ref 5–15)
BUN: 8 mg/dL (ref 6–20)
CO2: 28 mmol/L (ref 22–32)
Calcium: 8.7 mg/dL — ABNORMAL LOW (ref 8.9–10.3)
Chloride: 106 mmol/L (ref 98–111)
Creatinine, Ser: 0.89 mg/dL (ref 0.61–1.24)
GFR, Estimated: >60 mL/min (ref >60)
Glucose, Bld: 111 mg/dL — ABNORMAL HIGH (ref 70–99)
Potassium: 4.2 mmol/L (ref 3.5–5.1)
Sodium: 145 mmol/L (ref 135–145)
Total Bilirubin: 1.1 mg/dL (ref 0.0–1.2)
Total Protein: 6.3 g/dL — ABNORMAL LOW (ref 6.5–8.1)

## 2024-06-29 LAB — GLUCOSE, CAPILLARY
Glucose-Capillary: 117 mg/dL — ABNORMAL HIGH (ref 70–99)
Glucose-Capillary: 95 mg/dL (ref 70–99)
Glucose-Capillary: 96 mg/dL (ref 70–99)
Glucose-Capillary: 96 mg/dL (ref 70–99)
Glucose-Capillary: 98 mg/dL (ref 70–99)

## 2024-06-29 LAB — CBC
HCT: 42.6 % (ref 39.0–52.0)
Hemoglobin: 13 g/dL (ref 13.0–17.0)
MCH: 27.1 pg (ref 26.0–34.0)
MCHC: 30.5 g/dL (ref 30.0–36.0)
MCV: 88.8 fL (ref 80.0–100.0)
Platelets: 209 K/uL (ref 150–400)
RBC: 4.8 MIL/uL (ref 4.22–5.81)
RDW: 14.5 % (ref 11.5–15.5)
WBC: 8.9 K/uL (ref 4.0–10.5)
nRBC: 0 % (ref 0.0–0.2)

## 2024-06-29 LAB — MAGNESIUM: Magnesium: 2.1 mg/dL (ref 1.7–2.4)

## 2024-06-29 LAB — PHOSPHORUS: Phosphorus: 2.8 mg/dL (ref 2.5–4.6)

## 2024-06-29 MED ORDER — METOPROLOL TARTRATE 5 MG/5ML IV SOLN
5.0000 mg | Freq: Three times a day (TID) | INTRAVENOUS | Status: DC
  Administered 2024-06-29 – 2024-07-04 (×16): 5 mg via INTRAVENOUS
  Filled 2024-06-29 (×16): qty 5

## 2024-06-29 NOTE — Plan of Care (Signed)
  Problem: Health Behavior/Discharge Planning: Goal: Ability to manage health-related needs will improve Outcome: Progressing   Problem: Clinical Measurements: Goal: Will remain free from infection Outcome: Progressing Goal: Diagnostic test results will improve Outcome: Progressing   Problem: Coping: Goal: Level of anxiety will decrease Outcome: Progressing   Problem: Elimination: Goal: Will not experience complications related to bowel motility Outcome: Progressing   Problem: Pain Managment: Goal: General experience of comfort will improve and/or be controlled Outcome: Progressing

## 2024-06-29 NOTE — Plan of Care (Signed)

## 2024-06-29 NOTE — Progress Notes (Signed)
 PROGRESS NOTE  Jordan [name] FMW:[id number] DOB: [birth year]   PCP: [name], MD  Patient is from: Home.  Lives with wife.  DOA: 06/26/2024 LOS: 3  Chief complaints Chief Complaint  Patient presents with   Emesis     Brief Narrative / Interim history: 43 year old M with PMH of diverticulitis complicated by incarcerated hernia s/p colostomy in 2021, OSA not on CPAP, HTN, morbid obesity, reactive airway disease and prediabetes presented to ED due to left-sided abdominal pain after lifting heavy case of water with associated nausea and vomiting, and admitted with small bowel obstruction with possible incarceration and strangulation in the setting of large parastomal hernia containing sigmoid colon and a small bowel.  Lactic acid elevated to 3.4. Patient was taken to the OR and had ex lap with LOA, small bowel resection,  repair of parastomal hernia and revision of descending colostomy by Dr. Curvin on 7/1.  Postoperatively, started on PCA for pain control.   Subjective: Seen and examined earlier this morning.  No major events overnight or this morning.  Sleeping. Objective: Vitals:   06/29/24 0806 06/29/24 0937 06/29/24 1101 06/29/24 1235  BP:  (!) 153/99    Pulse:  (!) 104    Resp: 15 17 14 14   Temp:  98.1 F (36.7 C)    TempSrc:  Oral    SpO2: 100% 99% 98% 99%  Weight:      Height:        Examination:  GENERAL: No apparent distress.  Nontoxic. HEENT: MMM.  Vision and hearing grossly intact.  NGT in place. NECK: Supple.  No apparent JVD.  RESP:  No IWOB.  Fair aeration bilaterally. CVS:  RRR. Heart sounds normal.  ABD/GI/GU: BS+. Abd soft.  Colostomy in place.  Appropriately tender.  Dressing over laparotomy wound. MSK/EXT:  Moves extremities. No apparent deformity. No edema.  SKIN: no apparent skin lesion or wound NEURO: Sleeping.  No apparent focal neuro deficit. PSYCH: Calm and sleeping.  Consultants:  General surgery  Procedures: 7/1-ex lap  with LOA, small bowel resection,  repair of parastomal hernia and revision of descending colostomy by Dr. Curvin  Microbiology summarized: None  Assessment and plan: Small Bowel Obstruction presents with sudden onset lower abdominal pain with nausea and vomiting.  CT suggested SBO with possible incarceration and strangulation in the setting of large parastomal hernia containing sigmoid colon and a small bowel.  Patient was taken to the OR and had ex lap with LOA, small bowel resection,  repair of parastomal hernia and revision of descending colostomy by Dr. Curvin on 7/1 -Postoperative care and pain control per general surgery-n.p.o., ice chips, NGT and IV Zosyn  -Continue PPI while NPO -IV fluid while NPO -Ostomy care   Hypertensive urgency/sinus tachycardia: BP improved. -Pain control -IV hydralazine  PRN elevated BP - Scheduled IV metoprolol  5 mg every 8 hours.  AKI: b/l Cr 0.9-1.0.  AKI resolved. Recent Labs    11/05/23 2317 11/06/23 0424 11/07/23 0723 11/08/23 9375 11/09/23 0533 02/06/24 0850 06/26/24 0226 06/27/24 0630 06/28/24 0635 06/29/24 0622  BUN <5* <5* 7 7 5* 9 7 5* 7 8  CREATININE 1.09 0.94 1.00 1.07 0.94 0.90 1.03 1.06 1.32* 0.89  -Continue IVF while NPO -Recheck in the morning    High anion gap metabolic acidosis: Due to #1.  Resolved.   Hyperglycemia: History of prediabetes but A1c is 5.6%.   Obstructive sleep apnea not on CPAP. -Supplemental oxygen nightly  Leukocytosis likely due to #1 and demargination.  Resolved. - On IV Zosyn  - Continue monitoring  Morbid obesity Body mass index is 44.87 kg/m.           DVT prophylaxis:  enoxaparin  (LOVENOX ) injection 40 mg Start: 06/27/24 1000 SCD's Start: 06/26/24 2118  Code Status: Full code Family Communication: None at bedside today. Level of care: Med-Surg Status is: Inpatient Remains inpatient appropriate because: Small bowel obstruction   Final disposition: To be determined   35 minutes  with more than 50% spent in reviewing records, counseling patient/family and coordinating care.   Sch Meds:  Scheduled Meds:  Chlorhexidine  Gluconate Cloth  6 each Topical Q0600   enoxaparin  (LOVENOX ) injection  40 mg Subcutaneous Q24H   metoprolol  tartrate  5 mg Intravenous Q8H   morphine    Intravenous Q4H   pantoprazole  (PROTONIX ) IV  40 mg Intravenous QHS   sodium hypochlorite   Irrigation BID   Continuous Infusions:  lactated ringers  100 mL/hr at 06/29/24 1239   piperacillin -tazobactam (ZOSYN )  IV 3.375 g (06/29/24 1400)   PRN Meds:.albuterol , diphenhydrAMINE  **OR** diphenhydrAMINE , hydrALAZINE , methocarbamol  (ROBAXIN ) injection, naloxone  **AND** sodium chloride  flush, ondansetron  **OR** ondansetron  (ZOFRAN ) IV, mouth rinse  Antimicrobials: Anti-infectives (From admission, onward)    Start     Dose/Rate Route Frequency Ordered Stop   06/27/24 0200  piperacillin -tazobactam (ZOSYN ) IVPB 3.375 g        3.375 g 12.5 mL/hr over 240 Minutes Intravenous Every 8 hours 06/27/24 0113 07/02/24 0559   06/26/24 2130  piperacillin -tazobactam (ZOSYN ) IVPB 3.375 g  Status:  Discontinued        3.375 g 12.5 mL/hr over 240 Minutes Intravenous Every 8 hours 06/26/24 2117 06/27/24 0113   06/26/24 0830  cefoTEtan  (CEFOTAN ) 2 g in sodium chloride  0.9 % 100 mL IVPB        2 g 200 mL/hr over 30 Minutes Intravenous On call to O.R. 06/26/24 0820 06/26/24 1335        I have personally reviewed the following labs and images: CBC: Recent Labs  Lab 06/26/24 0226 06/26/24 1639 06/26/24 1701 06/27/24 0630 06/28/24 0635 06/29/24 0622  WBC 9.8  --   --  20.4* 15.3* 8.9  NEUTROABS 4.4  --   --   --  12.8*  --   HGB 16.2 17.3* 16.0 14.6 13.3 13.0  HCT 51.6 51.0 47.0 45.5 44.4 42.6  MCV 86.3  --   --  86.7 91.4 88.8  PLT 333  --   --  267 225 209   BMP &GFR Recent Labs  Lab 06/26/24 0226 06/26/24 1639 06/26/24 1701 06/27/24 0630 06/28/24 0635 06/29/24 0622  NA 136 142 141 144 141 145  K  3.9 6.1* 4.5 4.0 4.7 4.2  CL 98  --   --  106 105 106  CO2 19*  --   --  27 24 28   GLUCOSE 157*  --   --  149* 119* 111*  BUN 7  --   --  5* 7 8  CREATININE 1.03  --   --  1.06 1.32* 0.89  CALCIUM 9.4  --   --  8.4* 8.6* 8.7*  MG 2.1  --   --  2.0 2.1 2.1  PHOS  --   --   --   --  3.0 2.8   Estimated Creatinine Clearance: 132.9 mL/min (by C-G formula based on SCr of 0.89 mg/dL). Liver & Pancreas: Recent Labs  Lab 06/26/24 0226 06/28/24 0635 06/29/24 0622  AST 29 48* 32  ALT 17 23  20  ALKPHOS 60 60 55  BILITOT 0.8 1.1 1.1  PROT 7.8 6.1* 6.3*  ALBUMIN  4.2 2.9* 2.8*   Recent Labs  Lab 06/26/24 0226  LIPASE 33   No results for input(s): AMMONIA in the last 168 hours. Diabetic: No results for input(s): HGBA1C in the last 72 hours.  Recent Labs  Lab 06/28/24 2026 06/29/24 0015 06/29/24 0440 06/29/24 0938 06/29/24 1320  GLUCAP 110* 117* 95 98 96   Cardiac Enzymes: No results for input(s): CKTOTAL, CKMB, CKMBINDEX, TROPONINI in the last 168 hours. No results for input(s): PROBNP in the last 8760 hours. Coagulation Profile: No results for input(s): INR, PROTIME in the last 168 hours. Thyroid  Function Tests: No results for input(s): TSH, T4TOTAL, FREET4, T3FREE, THYROIDAB in the last 72 hours. Lipid Profile: No results for input(s): CHOL, HDL, LDLCALC, TRIG, CHOLHDL, LDLDIRECT in the last 72 hours. Anemia Panel: No results for input(s): VITAMINB12, FOLATE, FERRITIN, TIBC, IRON, RETICCTPCT in the last 72 hours. Urine analysis:    Component Value Date/Time   COLORURINE YELLOW 11/05/2023 2314   APPEARANCEUR HAZY (A) 11/05/2023 2314   APPEARANCEUR Hazy (A) 09/12/2020 1058   LABSPEC 1.019 11/05/2023 2314   PHURINE 6.0 11/05/2023 2314   GLUCOSEU NEGATIVE 11/05/2023 2314   HGBUR NEGATIVE 11/05/2023 2314   BILIRUBINUR NEGATIVE 11/05/2023 2314   BILIRUBINUR Negative 08/27/2020 1332   KETONESUR 20 (A) 11/05/2023 2314    PROTEINUR 100 (A) 11/05/2023 2314   UROBILINOGEN 0.2 05/04/2020 1400   NITRITE NEGATIVE 11/05/2023 2314   LEUKOCYTESUR NEGATIVE 11/05/2023 2314   Sepsis Labs: Invalid input(s): PROCALCITONIN, LACTICIDVEN  Microbiology: No results found for this or any previous visit (from the past 240 hours).  Radiology Studies: No results found.    Javione Gunawan T. Bryndan Bilyk [facility]  If 7PM-7AM, please contact night-coverage [email or web address] 06/29/2024, 2:18 PM

## 2024-06-29 NOTE — Care Management Important Message (Signed)
 Important Message  Patient Details  Name: Jordan [name] MRN: [id number] Date of Birth: 16-Jul-1981   Important Message Given:  Yes - Medicare IM     Jon Cruel 06/29/2024, 12:03 PM

## 2024-06-29 NOTE — Progress Notes (Signed)
 3 Days Post-Op  Subjective: CC: Denies having nausea or a BM but continues to endorse large amount of flatus. Pain is as expected post-op and appears tolerable.Patient is also concerned about hiccups.  Patient HDS and in NAD.   Objective: Vital signs in last 24 hours: Temp:  [98.1 F (36.7 C)-98.5 F (36.9 C)] 98.1 F (36.7 C) (07/04 0937) Pulse Rate:  [98-105] 104 (07/04 0937) Resp:  [14-18] 17 (07/04 0937) BP: (153-157)/(81-105) 153/99 (07/04 0937) SpO2:  [94 %-100 %] 99 % (07/04 0937) FiO2 (%):  [24 %] 24 % (07/04 0806) Last BM Date : 06/26/24  Intake/Output from previous day: 07/03 0701 - 07/04 0700 In: 1722.4 [I.V.:1561.4; IV Piggyback:161.1] Out: 3360 [Urine:1925; Emesis/NG output:1400; Drains:35] Intake/Output this shift: No intake/output data recorded.  PE: Physical Exam Constitutional:      General: He is not in acute distress.    Appearance: He is not diaphoretic.  Pulmonary:     Effort: Pulmonary effort is normal.  Abdominal:     General: There is distension.     Palpations: There is no mass.     Tenderness: There is abdominal tenderness.     Hernia: No hernia is present.     Comments: New ostomy bag placed 7/2, surgical wound with WTD dressing ABD pad that appears clean without evidence of active bleeding. Ostomy with serosanguinous drainage and small volume of gas. No stool noted.   Skin:    General: Skin is warm and dry.  Neurological:     General: No focal deficit present.     Mental Status: He is alert and oriented to person, place, and time.     Lab Results:  Recent Labs    06/28/24 0635 06/29/24 0622  WBC 15.3* 8.9  HGB 13.3 13.0  HCT 44.4 42.6  PLT 225 209   BMET Recent Labs    06/28/24 0635 06/29/24 0622  NA 141 145  K 4.7 4.2  CL 105 106  CO2 24 28  GLUCOSE 119* 111*  BUN 7 8  CREATININE 1.32* 0.89  CALCIUM 8.6* 8.7*   PT/INR No results for input(s): LABPROT, INR in the last 72 hours. CMP     Component Value  Date/Time   NA 145 06/29/2024 0622   NA 140 02/06/2024 0850   K 4.2 06/29/2024 0622   CL 106 06/29/2024 0622   CO2 28 06/29/2024 0622   GLUCOSE 111 (H) 06/29/2024 0622   BUN 8 06/29/2024 0622   BUN 9 02/06/2024 0850   CREATININE 0.89 06/29/2024 0622   CALCIUM 8.7 (L) 06/29/2024 0622   PROT 6.3 (L) 06/29/2024 0622   PROT 6.5 02/06/2024 0850   ALBUMIN  2.8 (L) 06/29/2024 0622   ALBUMIN  4.1 02/06/2024 0850   AST 32 06/29/2024 0622   ALT 20 06/29/2024 0622   ALKPHOS 55 06/29/2024 0622   BILITOT 1.1 06/29/2024 0622   BILITOT 0.3 02/06/2024 0850   GFRNONAA >60 06/29/2024 0622   GFRAA >60 09/29/2020 0901   Lipase     Component Value Date/Time   LIPASE 33 06/26/2024 0226    Studies/Results: No results found.   Anti-infectives: Anti-infectives (From admission, onward)    Start     Dose/Rate Route Frequency Ordered Stop   06/27/24 0200  piperacillin -tazobactam (ZOSYN ) IVPB 3.375 g        3.375 g 12.5 mL/hr over 240 Minutes Intravenous Every 8 hours 06/27/24 0113 07/02/24 0559   06/26/24 2130  piperacillin -tazobactam (ZOSYN ) IVPB 3.375 g  Status:  Discontinued        3.375 g 12.5 mL/hr over 240 Minutes Intravenous Every 8 hours 06/26/24 2117 06/27/24 0113   06/26/24 0830  cefoTEtan  (CEFOTAN ) 2 g in sodium chloride  0.9 % 100 mL IVPB        2 g 200 mL/hr over 30 Minutes Intravenous On call to O.R. 06/26/24 0820 06/26/24 1335        Assessment/Plan POD 2- LAPAROTOMY, EXPLORATORY (N/A) - Lysis of adhesions, small bowel resection, repair of paristomal hernia, revision of decending colostomy, Dr. Curvin, 7/1    WBC is 8.9 K this AM. NGT output of 1400 mL overnight. WOC consulted and recommended 5 days of Dakins solution with WTD dressing changes. Awaiting return of bowel function.   FEN - NPO except with ice chips, NGT to LWIS.  VTE - SCD's, Lovenox  ID - Continue IV zosyn , Dakins solution for wound care.  Foley - In place with good urine output.  Plan - Monitor for post-op  ileus, continue pain management in a multimodal fashion. Monitor NGT output, start wound vac today.   I reviewed nursing notes, last 24 h vitals and pain scores, last 48 h intake and output, last 24 h labs and trends, and last 24 h imaging results.  This care required moderate level of medical decision making.    LOS: 3 days    Eulah Hammonds, [facility] 06/29/2024, 10:13 AM Please see Amion for pager number during day hours 7:00am-4:30pm

## 2024-06-29 NOTE — Progress Notes (Signed)
 Pt's wife called this pm around 2pm and firmly requested that the assigned room was too warm despite facilities having adjusted the temperature in the room. Patient placement and unit leadership contacted for guidance. House supervisor advised moving pt to an open room once a patient had been discharged. Room [phone] freed up after the pt was discharged from that room. Bed transfer to that room was requested. When room was clean and ready for the pt to change rooms, pt's wife decided that she did not want him to move to that room anymore. Request for bed transfer cancelled

## 2024-06-29 NOTE — Plan of Care (Signed)
  Problem: Pain Managment: Goal: General experience of comfort will improve and/or be controlled Outcome: Progressing   Problem: Safety: Goal: Ability to remain free from injury will improve Outcome: Progressing

## 2024-06-30 DIAGNOSIS — R7303 Prediabetes: Secondary | ICD-10-CM | POA: Diagnosis not present

## 2024-06-30 DIAGNOSIS — E8729 Other acidosis: Secondary | ICD-10-CM | POA: Diagnosis not present

## 2024-06-30 DIAGNOSIS — K56609 Unspecified intestinal obstruction, unspecified as to partial versus complete obstruction: Secondary | ICD-10-CM | POA: Diagnosis not present

## 2024-06-30 DIAGNOSIS — I160 Hypertensive urgency: Secondary | ICD-10-CM | POA: Diagnosis not present

## 2024-06-30 LAB — CBC
HCT: 42.1 % (ref 39.0–52.0)
Hemoglobin: 12.9 g/dL — ABNORMAL LOW (ref 13.0–17.0)
MCH: 27 pg (ref 26.0–34.0)
MCHC: 30.6 g/dL (ref 30.0–36.0)
MCV: 88.1 fL (ref 80.0–100.0)
Platelets: 270 K/uL (ref 150–400)
RBC: 4.78 MIL/uL (ref 4.22–5.81)
RDW: 14.3 % (ref 11.5–15.5)
WBC: 8.9 K/uL (ref 4.0–10.5)
nRBC: 0 % (ref 0.0–0.2)

## 2024-06-30 LAB — COMPREHENSIVE METABOLIC PANEL WITH GFR
ALT: 21 U/L (ref 0–44)
AST: 28 U/L (ref 15–41)
Albumin: 3.1 g/dL — ABNORMAL LOW (ref 3.5–5.0)
Alkaline Phosphatase: 54 U/L (ref 38–126)
Anion gap: 12 (ref 5–15)
BUN: 8 mg/dL (ref 6–20)
CO2: 28 mmol/L (ref 22–32)
Calcium: 9 mg/dL (ref 8.9–10.3)
Chloride: 106 mmol/L (ref 98–111)
Creatinine, Ser: 1.16 mg/dL (ref 0.61–1.24)
GFR, Estimated: >60 mL/min (ref >60)
Glucose, Bld: 99 mg/dL (ref 70–99)
Potassium: 3.8 mmol/L (ref 3.5–5.1)
Sodium: 146 mmol/L — ABNORMAL HIGH (ref 135–145)
Total Bilirubin: 1 mg/dL (ref 0.0–1.2)
Total Protein: 7.2 g/dL (ref 6.5–8.1)

## 2024-06-30 LAB — GLUCOSE, CAPILLARY
Glucose-Capillary: 124 mg/dL — ABNORMAL HIGH (ref 70–99)
Glucose-Capillary: 129 mg/dL — ABNORMAL HIGH (ref 70–99)
Glucose-Capillary: 88 mg/dL (ref 70–99)
Glucose-Capillary: 88 mg/dL (ref 70–99)
Glucose-Capillary: 91 mg/dL (ref 70–99)
Glucose-Capillary: 94 mg/dL (ref 70–99)

## 2024-06-30 LAB — MAGNESIUM: Magnesium: 2.3 mg/dL (ref 1.7–2.4)

## 2024-06-30 LAB — PHOSPHORUS: Phosphorus: 2.9 mg/dL (ref 2.5–4.6)

## 2024-06-30 MED ORDER — DEXTROSE-SODIUM CHLORIDE 5-0.45 % IV SOLN: INTRAVENOUS | Status: AC

## 2024-06-30 MED ORDER — CHLORPROMAZINE HCL 25 MG/ML IJ SOLN
12.5000 mg | Freq: Three times a day (TID) | INTRAMUSCULAR | Status: DC | PRN
  Administered 2024-06-30: 12.5 mg via INTRAMUSCULAR
  Filled 2024-06-30 (×2): qty 0.5

## 2024-06-30 NOTE — Plan of Care (Signed)
  Problem: Education: Goal: Knowledge of General Education information will improve Description: Including pain rating scale, medication(s)/side effects and non-pharmacologic comfort measures Outcome: Progressing   Problem: Clinical Measurements: Goal: Respiratory complications will improve Outcome: Progressing   Problem: Activity: Goal: Risk for activity intolerance will decrease Outcome: Not Progressing   Problem: Nutrition: Goal: Adequate nutrition will be maintained Outcome: Progressing   Problem: Coping: Goal: Level of anxiety will decrease Outcome: Not Progressing

## 2024-06-30 NOTE — Progress Notes (Signed)
 4 Days Post-Op  Subjective: CC: Denies having nausea or a BM but continues to endorse large amount of flatus. Pain is as expected post-op and appears tolerable.Patient is also concerned about hiccups.  Patient HDS and in NAD.   Objective: Vital signs in last 24 hours: Temp:  [97.6 F (36.4 C)-98.3 F (36.8 C)] 98.3 F (36.8 C) (07/05 0738) Pulse Rate:  [80-96] 80 (07/05 0738) Resp:  [14-20] (P) 14 (07/05 1039) BP: (138-160)/(78-97) 160/97 (07/05 0738) SpO2:  [96 %-100 %] 99 % (07/05 0738) FiO2 (%):  [24 %] 24 % (07/05 0407) Last BM Date : 06/26/24  Intake/Output from previous day: 07/04 0701 - 07/05 0700 In: 864.8 [I.V.:723.7; IV Piggyback:141.1] Out: 3790 [Urine:400; Emesis/NG output:3350; Drains:40] Intake/Output this shift: Total I/O In: -  Out: 1250 [Urine:350; Emesis/NG output:900]  PE: Physical Exam Constitutional:      General: He is not in acute distress.    Appearance: He is not diaphoretic.  Pulmonary:     Effort: Pulmonary effort is normal.  Abdominal:     General: There is distension.     Palpations: There is no mass.     Tenderness: There is abdominal tenderness.     Hernia: No hernia is present.     Comments: New ostomy bag placed 7/2, surgical wound with WTD dressing ABD pad that appears clean without evidence of active bleeding. Ostomy with serosanguinous drainage and small volume of gas. Moderate amount of soft stool noted.    Skin:    General: Skin is warm and dry.  Neurological:     General: No focal deficit present.     Mental Status: He is alert and oriented to person, place, and time.     Lab Results:  Recent Labs    06/29/24 0622 06/30/24 0854  WBC 8.9 8.9  HGB 13.0 12.9*  HCT 42.6 42.1  PLT 209 270   BMET Recent Labs    06/29/24 0622 06/30/24 0854  NA 145 146*  K 4.2 3.8  CL 106 106  CO2 28 28  GLUCOSE 111* 99  BUN 8 8  CREATININE 0.89 1.16  CALCIUM 8.7* 9.0   PT/INR No results for input(s): LABPROT, INR in the  last 72 hours. CMP     Component Value Date/Time   NA 146 (H) 06/30/2024 0854   NA 140 02/06/2024 0850   K 3.8 06/30/2024 0854   CL 106 06/30/2024 0854   CO2 28 06/30/2024 0854   GLUCOSE 99 06/30/2024 0854   BUN 8 06/30/2024 0854   BUN 9 02/06/2024 0850   CREATININE 1.16 06/30/2024 0854   CALCIUM 9.0 06/30/2024 0854   PROT 7.2 06/30/2024 0854   PROT 6.5 02/06/2024 0850   ALBUMIN  3.1 (L) 06/30/2024 0854   ALBUMIN  4.1 02/06/2024 0850   AST 28 06/30/2024 0854   ALT 21 06/30/2024 0854   ALKPHOS 54 06/30/2024 0854   BILITOT 1.0 06/30/2024 0854   BILITOT 0.3 02/06/2024 0850   GFRNONAA >60 06/30/2024 0854   GFRAA >60 09/29/2020 0901   Lipase     Component Value Date/Time   LIPASE 33 06/26/2024 0226    Studies/Results: No results found.   Anti-infectives: Anti-infectives (From admission, onward)    Start     Dose/Rate Route Frequency Ordered Stop   06/27/24 0200  piperacillin -tazobactam (ZOSYN ) IVPB 3.375 g        3.375 g 12.5 mL/hr over 240 Minutes Intravenous Every 8 hours 06/27/24 0113 07/02/24 0559   06/26/24 2130  piperacillin -tazobactam (ZOSYN ) IVPB 3.375 g  Status:  Discontinued        3.375 g 12.5 mL/hr over 240 Minutes Intravenous Every 8 hours 06/26/24 2117 06/27/24 0113   06/26/24 0830  cefoTEtan  (CEFOTAN ) 2 g in sodium chloride  0.9 % 100 mL IVPB        2 g 200 mL/hr over 30 Minutes Intravenous On call to O.R. 06/26/24 0820 06/26/24 1335        Assessment/Plan POD 4 - LAPAROTOMY, EXPLORATORY (N/A) - Lysis of adhesions, small bowel resection, repair of paristomal hernia, revision of decending colostomy, Dr. Curvin, 7/1    WBC is 8.9 K this AM. NGT output of 900 mL overnight. WOC consulted and recommended 5 days of Dakins solution with WTD dressing changes. Had a BM this AM.   FEN - CLD, dc NGT  VTE - SCD's, Lovenox  ID - Continue IV zosyn , Dakins solution for wound care.  Foley - In place with good urine output.  Plan - Monitor for post-op ileus,  continue pain management in a multimodal fashion. Monitor NGT output.   I reviewed nursing notes, last 24 h vitals and pain scores, last 48 h intake and output, last 24 h labs and trends, and last 24 h imaging results.  This care required moderate level of medical decision making.    LOS: 4 days    Eulah Hammonds, [facility] 06/30/2024, 10:47 AM Please see Amion for pager number during day hours 7:00am-4:30pm

## 2024-06-30 NOTE — Plan of Care (Signed)
  Problem: Pain Managment: Goal: General experience of comfort will improve and/or be controlled Outcome: Progressing   Problem: Safety: Goal: Ability to remain free from injury will improve Outcome: Progressing

## 2024-06-30 NOTE — Progress Notes (Signed)
 Pulled patient's NG tube at 1740. yellow thin, bile in cannister

## 2024-06-30 NOTE — Progress Notes (Signed)
 PROGRESS NOTE  Jordan [name] FMW:[id number] DOB: April 25, 1981   PCP: [name], MD  Patient is from: Home.  Lives with wife.  DOA: 06/26/2024 LOS: 4  Chief complaints Chief Complaint  Patient presents with   Emesis     Brief Narrative / Interim history: 43 year old M with PMH of diverticulitis complicated by incarcerated hernia s/p colostomy in 2021, OSA not on CPAP, HTN, morbid obesity, reactive airway disease and prediabetes presented to ED due to left-sided abdominal pain after lifting heavy case of water with associated nausea and vomiting, and admitted with small bowel obstruction with possible incarceration and strangulation in the setting of large parastomal hernia containing sigmoid colon and a small bowel.  Lactic acid elevated to 3.4. Patient was taken to the OR and had ex lap with LOA, small bowel resection,  repair of parastomal hernia and revision of descending colostomy by Dr. Curvin on 7/1.  Postoperatively, started on PCA for pain control.   Subjective: Seen and examined earlier this morning.  No major events overnight or this morning.  Reports significant pain at ostomy site.  About 3.4 L output from NGT in the last 24 hours.  Objective: Vitals:   06/30/24 0407 06/30/24 0738 06/30/24 0835 06/30/24 1039  BP:  (!) 160/97    Pulse:  80    Resp: 17 16 18 14   Temp:  98.3 F (36.8 C)    TempSrc:  Oral    SpO2: 98% 99%    Weight:      Height:        Examination:  GENERAL: No apparent distress.  Nontoxic. HEENT: MMM.  Vision and hearing grossly intact.  NGT in place. NECK: Supple.  No apparent JVD.  RESP:  No IWOB.  Fair aeration bilaterally. CVS:  RRR. Heart sounds normal.  ABD/GI/GU: BS+. Abd soft.  Colostomy empty.  Appropriately tender.  Dressing over laparotomy wound. MSK/EXT:  Moves extremities. No apparent deformity. No edema.  SKIN: no apparent skin lesion or wound NEURO: Awake and alert.  Oriented x 4.  No apparent focal neuro  deficit. PSYCH: Calm and sleeping.  Consultants:  General surgery  Procedures: 7/1-ex lap with LOA, small bowel resection,  repair of parastomal hernia and revision of descending colostomy by Dr. Curvin  Microbiology summarized: None  Assessment and plan: Small Bowel Obstruction presents with sudden onset lower abdominal pain with nausea and vomiting.  CT suggested SBO with possible incarceration and strangulation in the setting of large parastomal hernia containing sigmoid colon and a small bowel.  Patient was taken to the OR and had ex lap with LOA, small bowel resection,  repair of parastomal hernia and revision of descending colostomy by Dr. Curvin on 7/1 -Postoperative care and pain control per general surgery-IV Zosyn , CLD, d/c NGT -Wound care and ostomy care. -Continue PPI and IV fluid. -Pain control per surgery.    Hypertensive urgency/sinus tachycardia: BP improved. -Pain control -IV hydralazine  PRN elevated BP -IV metoprolol  5 mg every 8 hours.  AKI: b/l Cr 0.9-1.0.  AKI resolved. Recent Labs    11/06/23 0424 11/07/23 0723 11/08/23 0624 11/09/23 0533 02/06/24 0850 06/26/24 0226 06/27/24 0630 06/28/24 0635 06/29/24 0622 06/30/24 0854  BUN <5* 7 7 5* 9 7 5* 7 8 8   CREATININE 0.94 1.00 1.07 0.94 0.90 1.03 1.06 1.32* 0.89 1.16  -Continue IVF  -Recheck in the morning    High anion gap metabolic acidosis: Due to #1.  Resolved.   Hyperglycemia: History of prediabetes but A1c  is 5.6%.   Obstructive sleep apnea not on CPAP. -Supplemental oxygen nightly  Leukocytosis likely due to #1 and demargination.  Resolved. - On IV Zosyn   Morbid obesity Body mass index is 44.87 kg/m.           DVT prophylaxis:  enoxaparin  (LOVENOX ) injection 40 mg Start: 06/27/24 1000 SCD's Start: 06/26/24 2118  Code Status: Full code Family Communication: None at bedside today. Level of care: Med-Surg Status is: Inpatient Remains inpatient appropriate because: Small bowel  obstruction   Final disposition: To be determined   35 minutes with more than 50% spent in reviewing records, counseling patient/family and coordinating care.   Sch Meds:  Scheduled Meds:  Chlorhexidine  Gluconate Cloth  6 each Topical Q0600   enoxaparin  (LOVENOX ) injection  40 mg Subcutaneous Q24H   metoprolol  tartrate  5 mg Intravenous Q8H   morphine    Intravenous Q4H   pantoprazole  (PROTONIX ) IV  40 mg Intravenous QHS   sodium hypochlorite   Irrigation BID   Continuous Infusions:  dextrose  5 % and 0.45 % NaCl     piperacillin -tazobactam (ZOSYN )  IV 3.375 g (06/30/24 1333)   PRN Meds:.albuterol , diphenhydrAMINE  **OR** diphenhydrAMINE , hydrALAZINE , methocarbamol  (ROBAXIN ) injection, naloxone  **AND** sodium chloride  flush, ondansetron  **OR** ondansetron  (ZOFRAN ) IV, mouth rinse  Antimicrobials: Anti-infectives (From admission, onward)    Start     Dose/Rate Route Frequency Ordered Stop   06/27/24 0200  piperacillin -tazobactam (ZOSYN ) IVPB 3.375 g        3.375 g 12.5 mL/hr over 240 Minutes Intravenous Every 8 hours 06/27/24 0113 07/02/24 0559   06/26/24 2130  piperacillin -tazobactam (ZOSYN ) IVPB 3.375 g  Status:  Discontinued        3.375 g 12.5 mL/hr over 240 Minutes Intravenous Every 8 hours 06/26/24 2117 06/27/24 0113   06/26/24 0830  cefoTEtan  (CEFOTAN ) 2 g in sodium chloride  0.9 % 100 mL IVPB        2 g 200 mL/hr over 30 Minutes Intravenous On call to O.R. 06/26/24 0820 06/26/24 1335        I have personally reviewed the following labs and images: CBC: Recent Labs  Lab 06/26/24 0226 06/26/24 1639 06/26/24 1701 06/27/24 0630 06/28/24 0635 06/29/24 0622 06/30/24 0854  WBC 9.8  --   --  20.4* 15.3* 8.9 8.9  NEUTROABS 4.4  --   --   --  12.8*  --   --   HGB 16.2   < > 16.0 14.6 13.3 13.0 12.9*  HCT 51.6   < > 47.0 45.5 44.4 42.6 42.1  MCV 86.3  --   --  86.7 91.4 88.8 88.1  PLT 333  --   --  267 225 209 270   < > = values in this interval not displayed.    BMP &GFR Recent Labs  Lab 06/26/24 0226 06/26/24 1639 06/26/24 1701 06/27/24 0630 06/28/24 0635 06/29/24 0622 06/30/24 0854  NA 136   < > 141 144 141 145 146*  K 3.9   < > 4.5 4.0 4.7 4.2 3.8  CL 98  --   --  106 105 106 106  CO2 19*  --   --  27 24 28 28   GLUCOSE 157*  --   --  149* 119* 111* 99  BUN 7  --   --  5* 7 8 8   CREATININE 1.03  --   --  1.06 1.32* 0.89 1.16  CALCIUM 9.4  --   --  8.4* 8.6* 8.7* 9.0  MG  2.1  --   --  2.0 2.1 2.1 2.3  PHOS  --   --   --   --  3.0 2.8 2.9   < > = values in this interval not displayed.   Estimated Creatinine Clearance: 102 mL/min (by C-G formula based on SCr of 1.16 mg/dL). Liver & Pancreas: Recent Labs  Lab 06/26/24 0226 06/28/24 0635 06/29/24 0622 06/30/24 0854  AST 29 48* 32 28  ALT 17 23 20 21   ALKPHOS 60 60 55 54  BILITOT 0.8 1.1 1.1 1.0  PROT 7.8 6.1* 6.3* 7.2  ALBUMIN  4.2 2.9* 2.8* 3.1*   Recent Labs  Lab 06/26/24 0226  LIPASE 33   No results for input(s): AMMONIA in the last 168 hours. Diabetic: No results for input(s): HGBA1C in the last 72 hours.  Recent Labs  Lab 06/29/24 1655 06/30/24 0002 06/30/24 0354 06/30/24 0733 06/30/24 1219  GLUCAP 96 91 88 88 94   Cardiac Enzymes: No results for input(s): CKTOTAL, CKMB, CKMBINDEX, TROPONINI in the last 168 hours. No results for input(s): PROBNP in the last 8760 hours. Coagulation Profile: No results for input(s): INR, PROTIME in the last 168 hours. Thyroid  Function Tests: No results for input(s): TSH, T4TOTAL, FREET4, T3FREE, THYROIDAB in the last 72 hours. Lipid Profile: No results for input(s): CHOL, HDL, LDLCALC, TRIG, CHOLHDL, LDLDIRECT in the last 72 hours. Anemia Panel: No results for input(s): VITAMINB12, FOLATE, FERRITIN, TIBC, IRON, RETICCTPCT in the last 72 hours. Urine analysis:    Component Value Date/Time   COLORURINE YELLOW 11/05/2023 2314   APPEARANCEUR HAZY (A) 11/05/2023 2314    APPEARANCEUR Hazy (A) 09/12/2020 1058   LABSPEC 1.019 11/05/2023 2314   PHURINE 6.0 11/05/2023 2314   GLUCOSEU NEGATIVE 11/05/2023 2314   HGBUR NEGATIVE 11/05/2023 2314   BILIRUBINUR NEGATIVE 11/05/2023 2314   BILIRUBINUR Negative 08/27/2020 1332   KETONESUR 20 (A) 11/05/2023 2314   PROTEINUR 100 (A) 11/05/2023 2314   UROBILINOGEN 0.2 05/04/2020 1400   NITRITE NEGATIVE 11/05/2023 2314   LEUKOCYTESUR NEGATIVE 11/05/2023 2314   Sepsis Labs: Invalid input(s): PROCALCITONIN, LACTICIDVEN  Microbiology: No results found for this or any previous visit (from the past 240 hours).  Radiology Studies: No results found.    Elexis Pollak T. Oakes Mcc[facility]  If 7PM-7AM, please contact night-coverage [email or web address] 06/30/2024, 2:36 PM

## 2024-07-01 ENCOUNTER — Inpatient Hospital Stay (HOSPITAL_COMMUNITY)

## 2024-07-01 ENCOUNTER — Other Ambulatory Visit: Payer: Self-pay

## 2024-07-01 DIAGNOSIS — K56609 Unspecified intestinal obstruction, unspecified as to partial versus complete obstruction: Secondary | ICD-10-CM | POA: Diagnosis not present

## 2024-07-01 DIAGNOSIS — I160 Hypertensive urgency: Secondary | ICD-10-CM | POA: Diagnosis not present

## 2024-07-01 DIAGNOSIS — R7303 Prediabetes: Secondary | ICD-10-CM | POA: Diagnosis not present

## 2024-07-01 DIAGNOSIS — E8729 Other acidosis: Secondary | ICD-10-CM | POA: Diagnosis not present

## 2024-07-01 LAB — RENAL FUNCTION PANEL
Albumin: 2.5 g/dL — ABNORMAL LOW (ref 3.5–5.0)
Anion gap: 9 (ref 5–15)
BUN: <5 mg/dL — ABNORMAL LOW (ref 6–20)
CO2: 26 mmol/L (ref 22–32)
Calcium: 8.3 mg/dL — ABNORMAL LOW (ref 8.9–10.3)
Chloride: 101 mmol/L (ref 98–111)
Creatinine, Ser: 0.82 mg/dL (ref 0.61–1.24)
GFR, Estimated: >60 mL/min (ref >60)
Glucose, Bld: 121 mg/dL — ABNORMAL HIGH (ref 70–99)
Phosphorus: 2.9 mg/dL (ref 2.5–4.6)
Potassium: 3.4 mmol/L — ABNORMAL LOW (ref 3.5–5.1)
Sodium: 136 mmol/L (ref 135–145)

## 2024-07-01 LAB — URINALYSIS, ROUTINE W REFLEX MICROSCOPIC
Bilirubin Urine: NEGATIVE
Glucose, UA: NEGATIVE mg/dL
Hgb urine dipstick: NEGATIVE
Ketones, ur: NEGATIVE mg/dL
Leukocytes,Ua: NEGATIVE
Nitrite: NEGATIVE
Protein, ur: NEGATIVE mg/dL
Specific Gravity, Urine: 1.005 (ref 1.005–1.030)
pH: 8 (ref 5.0–8.0)

## 2024-07-01 LAB — CBC
HCT: 34.3 % — ABNORMAL LOW (ref 39.0–52.0)
Hemoglobin: 11 g/dL — ABNORMAL LOW (ref 13.0–17.0)
MCH: 27.4 pg (ref 26.0–34.0)
MCHC: 32.1 g/dL (ref 30.0–36.0)
MCV: 85.5 fL (ref 80.0–100.0)
Platelets: 237 K/uL (ref 150–400)
RBC: 4.01 MIL/uL — ABNORMAL LOW (ref 4.22–5.81)
RDW: 13.8 % (ref 11.5–15.5)
WBC: 8.1 K/uL (ref 4.0–10.5)
nRBC: 0 % (ref 0.0–0.2)

## 2024-07-01 LAB — GLUCOSE, CAPILLARY
Glucose-Capillary: 100 mg/dL — ABNORMAL HIGH (ref 70–99)
Glucose-Capillary: 103 mg/dL — ABNORMAL HIGH (ref 70–99)
Glucose-Capillary: 106 mg/dL — ABNORMAL HIGH (ref 70–99)
Glucose-Capillary: 107 mg/dL — ABNORMAL HIGH (ref 70–99)
Glucose-Capillary: 112 mg/dL — ABNORMAL HIGH (ref 70–99)
Glucose-Capillary: 123 mg/dL — ABNORMAL HIGH (ref 70–99)

## 2024-07-01 LAB — MAGNESIUM: Magnesium: 1.8 mg/dL (ref 1.7–2.4)

## 2024-07-01 MED ORDER — POTASSIUM CHLORIDE 10 MEQ/100ML IV SOLN
10.0000 meq | INTRAVENOUS | Status: AC
  Filled 2024-07-01: qty 100

## 2024-07-01 MED ORDER — POTASSIUM CHLORIDE 10 MEQ/100ML IV SOLN: 10.0000 meq | INTRAVENOUS | Status: DC

## 2024-07-01 MED ORDER — SODIUM CHLORIDE 0.9% FLUSH: 10.0000 mL | INTRAVENOUS | Status: DC | PRN

## 2024-07-01 MED ORDER — POTASSIUM CHLORIDE 10 MEQ/100ML IV SOLN
10.0000 meq | INTRAVENOUS | Status: AC
  Administered 2024-07-01 (×4): 10 meq via INTRAVENOUS
  Filled 2024-07-01 (×3): qty 100

## 2024-07-01 MED ORDER — IOHEXOL 350 MG/ML SOLN
100.0000 mL | Freq: Once | INTRAVENOUS | Status: AC | PRN
  Administered 2024-07-01: 100 mL via INTRAVENOUS

## 2024-07-01 MED ORDER — DEXTROSE-SODIUM CHLORIDE 5-0.45 % IV SOLN: INTRAVENOUS | Status: AC

## 2024-07-01 NOTE — Evaluation (Signed)
 Physical Therapy Evaluation Patient Details Name: Jordan [name] MRN: [id number] DOB: Mar 14, 1981 Today's Date: 07/01/2024  History of Present Illness  Pt is a 43 y/o male presenting on 7/1 with L sided abdominal pain and emesis. Admitted with SBO with possible incarceration and strangulation in setting of parastomal hernia. 7/1 s/p exlap, small bowel resection, repair of hernia and revision of descending colostomy.  PMH includes: asthma, HTN, OA, recurrent UTI, sleep apnea, R TKR.   Clinical Impression  Pt admitted with above diagnosis. PTA, pt was independent with functional mobility without an AD and modI for ADLs/IADLs as it took increased time. He lives with his wife and children in a one story house with 5 STE and unilateral handrail. Pt currently with functional limitations due to the deficits listed below (see PT Problem List). He required CGA for bed mobility and transfers. Pt demonstrated impulsivity, decreased insight, and poor safety awareness. He required cues throughout session. Pt desaturated on 3L O2, able to recover to >88% SpO2 with prolonged seated rest and PLB technique. Educated pt on the importance of maintaining supplemental oxygen in place and he pulled off the [place] multiple times during session. Pt will benefit from acute skilled PT to increase his independence and safety with mobility to allow discharge. Anticipate pt to progress well once pain is better managed, no follow-up PT needs.    If plan is discharge home, recommend the following: A little help with walking and/or transfers;A little help with bathing/dressing/bathroom;Assistance with cooking/housework;Assist for transportation;Help with stairs or ramp for entrance   Can travel by private vehicle        Equipment Recommendations None recommended by PT  Recommendations for Other Services       Functional Status Assessment Patient has had a recent decline in their functional status and demonstrates  the ability to make significant improvements in function in a reasonable and predictable amount of time.     Precautions / Restrictions Precautions Precautions: Fall;Other (comment) Recall of Precautions/Restrictions: Impaired Precaution/Restrictions Comments: abdominal precautions; L abd drain, PCA pump Restrictions Weight Bearing Restrictions Per Provider Order: No      Mobility  Bed Mobility Overal bed mobility: Needs Assistance Bed Mobility: Supine to Sit, Sit to Supine     Supine to sit: Contact guard, HOB elevated Sit to supine: Contact guard assist, HOB elevated   General bed mobility comments: Cued log roll technique given abdominal precautions. Pt did not follow completly. He sat up on left side of bed with increased time. Brought BLE back into bed.    Transfers Overall transfer level: Needs assistance Equipment used: None Transfers: Sit to/from Stand Sit to Stand: Contact guard assist           General transfer comment: Pt stood from lowest bed height by pulling on bed rail and pushign up from bed. CGA for safety/stability. Pt stood x3 reps from lowest bed height. Good eccentric control.    Ambulation/Gait               General Gait Details: Deferred secondary to desaturation into the 60-70s SpO2.  Stairs            Wheelchair Mobility     Tilt Bed    Modified Rankin (Stroke Patients Only)       Balance Overall balance assessment: Mild deficits observed, not formally tested  Pertinent Vitals/Pain Pain Assessment Pain Assessment: Faces Faces Pain Scale: Hurts even more Pain Location: abdomen Pain Descriptors / Indicators: Discomfort, Operative site guarding Pain Intervention(s): Limited activity within patient's tolerance, Monitored during session, PCA encouraged, Repositioned    Home Living Family/patient expects to be discharged to:: Private residence Living  Arrangements: Spouse/significant other (kids 72 and 9) Available Help at Discharge: Family;Available PRN/intermittently Type of Home: House Home Access: Stairs to enter Entrance Stairs-Rails: Left Entrance Stairs-Number of Steps: 5   Home Layout: One level Home Equipment: Cane - single point      Prior Function Prior Level of Function : Independent/Modified Independent             Mobility Comments: Indep without AD. Denies falls. ADLs Comments: ModI for ADLs. Pt reports everything takes longer.     Extremity/Trunk Assessment   Upper Extremity Assessment Upper Extremity Assessment: Defer to OT evaluation    Lower Extremity Assessment Lower Extremity Assessment: Overall WFL for tasks assessed    Cervical / Trunk Assessment Cervical / Trunk Assessment: Other exceptions Cervical / Trunk Exceptions: increased body habitus  Communication   Communication Communication: No apparent difficulties    Cognition Arousal: Alert Behavior During Therapy: Impulsive   PT - Cognitive impairments: No family/caregiver present to determine baseline, Safety/Judgement, Sequencing, Awareness                       PT - Cognition Comments: Pt appears to have decreased insight into current condition and poor safety awareness. He is quick to initate movement disregarding lines, leads, or vital signs. Not receptive to cues for sequencing, voiced preference to do it his way. Following commands: Impaired Following commands impaired: Follows multi-step commands inconsistently     Cueing Cueing Techniques: Verbal cues     General Comments General comments (skin integrity, edema, etc.): Pt on 3L [place] upon entry, requires cueing for PLB and rest for SpO2 to recover to >90%. Poor pleth and difficult to assess at times ranging from 60s to 80s but at end of sesssion 95% on 3L. Pt pulling O2 off multiple times but requires cueing to leave in place.    Exercises     Assessment/Plan    PT  Assessment Patient needs continued PT services  PT Problem List Decreased activity tolerance;Decreased balance;Decreased mobility;Pain;Obesity;Decreased skin integrity;Decreased safety awareness;Cardiopulmonary status limiting activity       PT Treatment Interventions DME instruction;Gait training;Stair training;Functional mobility training;Therapeutic activities;Therapeutic exercise;Balance training;Patient/family education    PT Goals (Current goals can be found in the Care Plan section)  Acute Rehab PT Goals Patient Stated Goal: Return Home PT Goal Formulation: With patient Time For Goal Achievement: 07/15/24 Potential to Achieve Goals: Good    Frequency Min 2X/week     Co-evaluation PT/OT/SLP Co-Evaluation/Treatment: Yes Reason for Co-Treatment: Necessary to address cognition/behavior during functional activity;To address functional/ADL transfers PT goals addressed during session: Balance;Mobility/safety with mobility         AM-PAC PT 6 Clicks Mobility  Outcome Measure Help needed turning from your back to your side while in a flat bed without using bedrails?: A Little Help needed moving from lying on your back to sitting on the side of a flat bed without using bedrails?: A Little Help needed moving to and from a bed to a chair (including a wheelchair)?: A Little Help needed standing up from a chair using your arms (e.g., wheelchair or bedside chair)?: A Little Help needed to walk in hospital room?: A Lot Help  needed climbing 3-5 steps with a railing? : A Lot 6 Click Score: 16    End of Session Equipment Utilized During Treatment: Oxygen Activity Tolerance: Patient tolerated treatment well Patient left: in bed;with call bell/phone within reach Nurse Communication: Mobility status PT Visit Diagnosis: Pain;Other abnormalities of gait and mobility (R26.89);Unsteadiness on feet (R26.81) Pain - Right/Left: Left Pain - part of body:  (Abdomen)    Time: 9064-8998 PT  Time Calculation (min) (ACUTE ONLY): 26 min   Charges:   PT Evaluation $PT Eval Moderate Complexity: 1 Mod   PT General Charges $$ ACUTE PT VISIT: 1 Visit         Randall SAUNDERS, PT, DPT Acute Rehabilitation Services Office: [phone] Secure Chat Preferred  Delon CHRISTELLA Callander 07/01/2024, 1:57 PM

## 2024-07-01 NOTE — Progress Notes (Signed)
 Tamekia RN notified PICC in place and ready to use. Notified to change all tubings and to remove the PIV.  Also notified that the PCA is empty from prior to PICC procedure.

## 2024-07-01 NOTE — Evaluation (Signed)
 Occupational Therapy Evaluation Patient Details Name: Jordan [name] MRN: [id number] DOB: 1981-08-02 Today's Date: 07/01/2024   History of Present Illness   Pt is a 43 y/o male presenting on 7/1 with L sided abdominal pain and emesis. Admitted with SBO with possible incarceration and strangulation in setting of parastomal hernia. 7/1 s/p exlap, small bowel resection, repair of hernia and revision of descending colostomy.  PMH includes: asthma, HTN, OA, recurrent UTI, sleep apnea, R TKR.     Clinical Impressions PTA patient reports independent with ADLs, IADls and mobility. Admitted for above and presents with problem list below.  He is impulsive and requires cueing throughout session for safety, poor insight at this time.  He requires contact guard for bed mobility and transfers, setup to mod assist for ADLs.  SpO2 on 3L during session, requires supplemental O2 and cueing for PLB to maintain SpO2 (pt pulling off SPO2 and requires cueing to keep it on).  Based on performance today, will follow acutely to optimize independence, tolerance and safety during ADLs but anticipate he will progress well with no further needs after dc home.      If plan is discharge home, recommend the following:   A little help with walking and/or transfers;A lot of help with bathing/dressing/bathroom;Assistance with cooking/housework;Assist for transportation;Help with stairs or ramp for entrance     Functional Status Assessment   Patient has had a recent decline in their functional status and demonstrates the ability to make significant improvements in function in a reasonable and predictable amount of time.     Equipment Recommendations   None recommended by OT     Recommendations for Other Services         Precautions/Restrictions   Precautions Precautions: Fall Recall of Precautions/Restrictions: Impaired Precaution/Restrictions Comments: abdominal prec, L abd drain, PCA  pump Restrictions Weight Bearing Restrictions Per Provider Order: No     Mobility Bed Mobility Overal bed mobility: Needs Assistance Bed Mobility: Supine to Sit, Sit to Supine     Supine to sit: Contact guard Sit to supine: Contact guard assist   General bed mobility comments: HOB elevated and cueing for rolling to protect abdomen but pt voices preference to do it his way    Transfers Overall transfer level: Needs assistance   Transfers: Sit to/from Stand Sit to Stand: Contact guard assist           General transfer comment: for safety      Balance Overall balance assessment: Mild deficits observed, not formally tested                                         ADL either performed or assessed with clinical judgement   ADL Overall ADL's : Needs assistance/impaired     Grooming: Set up;Sitting           Upper Body Dressing : Minimal assistance;Sitting   Lower Body Dressing: Sit to/from stand;Moderate assistance     Toilet Transfer Details (indicate cue type and reason): deferred         Functional mobility during ADLs: Contact guard assist;Cueing for safety General ADL Comments: pt transitioned to EOB with contact guard, stood 3x at EOB with contact guard but limited by decreased activity tolerance.  He requires constant cueing for PLB to maintain Spo2.     Vision   Vision Assessment?: No apparent visual deficits  Perception         Praxis         Pertinent Vitals/Pain Pain Assessment Pain Assessment: Faces Faces Pain Scale: Hurts even more Pain Location: abd Pain Descriptors / Indicators: Discomfort, Operative site guarding Pain Intervention(s): Limited activity within patient's tolerance, Monitored during session, Premedicated before session, PCA encouraged, Repositioned     Extremity/Trunk Assessment Upper Extremity Assessment Upper Extremity Assessment: Overall WFL for tasks assessed   Lower Extremity  Assessment Lower Extremity Assessment: Defer to PT evaluation   Cervical / Trunk Assessment Cervical / Trunk Assessment: Other exceptions Cervical / Trunk Exceptions: increased body habitus   Communication Communication Communication: No apparent difficulties   Cognition Arousal: Alert Behavior During Therapy: Impulsive Cognition: Cognition impaired     Awareness: Online awareness impaired     Executive functioning impairment (select all impairments): Problem solving, Reasoning OT - Cognition Comments: pt impulsive and requires redirection for safety                 Following commands: Impaired Following commands impaired: Follows multi-step commands inconsistently     Cueing  General Comments   Cueing Techniques: Verbal cues  pt on 3L [place] upon entry, requires cueing for PLB and rest for SpO2 to recover to >90%.  Poor pleuth and difficult to assess at times ranging from 70s to 80s but at end of sesssion 95% on 3L. Pt pulling O2 off multiple times but requires cueing to leave in place.   Exercises     Shoulder Instructions      Home Living Family/patient expects to be discharged to:: Private residence Living Arrangements: Spouse/significant other (kids 41 and 9) Available Help at Discharge: Family;Available PRN/intermittently Type of Home: House Home Access: Stairs to enter [employer] of Steps: 5 Entrance Stairs-Rails: Left Home Layout: One level     Bathroom Shower/Tub: Chief Strategy Officer: Handicapped height     Home Equipment: Cane - single point          Prior Functioning/Environment Prior Level of Function : Independent/Modified Independent             Mobility Comments: ind ADLs Comments: reports ind but everything takes longer    OT Problem List: Decreased strength;Decreased activity tolerance;Impaired balance (sitting and/or standing);Decreased knowledge of use of DME or AE;Decreased safety awareness;Decreased  knowledge of precautions;Pain;Obesity;Cardiopulmonary status limiting activity   OT Treatment/Interventions: Self-care/ADL training;Therapeutic exercise;DME and/or AE instruction;Therapeutic activities;Balance training;Patient/family education;Energy conservation      OT Goals(Current goals can be found in the care plan section)   Acute Rehab OT Goals Patient Stated Goal: get better OT Goal Formulation: With patient Time For Goal Achievement: 07/15/24 Potential to Achieve Goals: Good   OT Frequency:  Min 2X/week    Co-evaluation              AM-PAC OT 6 Clicks Daily Activity     Outcome Measure Help from another person eating meals?: Total Help from another person taking care of personal grooming?: A Little Help from another person toileting, which includes using toliet, bedpan, or urinal?: A Little Help from another person bathing (including washing, rinsing, drying)?: A Lot Help from another person to put on and taking off regular upper body clothing?: A Little Help from another person to put on and taking off regular lower body clothing?: A Lot 6 Click Score: 14   End of Session Equipment Utilized During Treatment: Oxygen Nurse Communication: Mobility status  Activity Tolerance: Patient limited by pain Patient  left: in bed;with call bell/phone within reach;with nursing/sitter in room  OT Visit Diagnosis: Other abnormalities of gait and mobility (R26.89);Muscle weakness (generalized) (M62.81);Pain Pain - part of body:  (abd)                Time: 0936-1000 OT Time Calculation (min): 24 min Charges:  OT General Charges $OT Visit: 1 Visit OT Evaluation $OT Eval Moderate Complexity: 1 Mod  Etta NOVAK, OT Acute Rehabilitation Services Office [phone] Secure Chat Preferred    Etta GORMAN Hope 07/01/2024, 1:26 PM

## 2024-07-01 NOTE — Progress Notes (Signed)
 Peripherally Inserted Central Catheter Placement  The IV Nurse has discussed with the patient and/or persons authorized to consent for the patient, the purpose of this procedure and the potential benefits and risks involved with this procedure.  The benefits include less needle sticks, lab draws from the catheter, and the patient may be discharged home with the catheter. Risks include, but not limited to, infection, bleeding, blood clot (thrombus formation), and puncture of an artery; nerve damage and irregular heartbeat and possibility to perform a PICC exchange if needed/ordered by physician.  Alternatives to this procedure were also discussed.  Bard Power PICC patient education guide, fact sheet on infection prevention and patient information card has been provided to patient /or left at bedside.    PICC Placement Documentation  PICC Triple Lumen 07/01/24 Right Cephalic 39 cm 0 cm (Active)  Indication for Insertion or Continuance of Line Administration of hyperosmolar/irritating solutions (i.e. TPN, Vancomycin , etc.) 07/01/24 1646  Exposed Catheter (cm) 0 cm 07/01/24 1646  Site Assessment Clean, Dry, Intact 07/01/24 1646  Lumen #1 Status Flushed;Saline locked;Blood return noted 07/01/24 1646  Lumen #2 Status Flushed;Saline locked;Blood return noted 07/01/24 1646  Lumen #3 Status Flushed;Saline locked;Blood return noted 07/01/24 1646  Dressing Type Transparent;Securing device 07/01/24 1646  Dressing Status Antimicrobial disc/dressing in place 07/01/24 1646  Line Care Connections checked and tightened 07/01/24 1646  Line Adjustment (NICU/IV Team Only) No 07/01/24 1646  Dressing Intervention New dressing;Adhesive placed at insertion site (IV team only);Adhesive placed around edges of dressing (IV team/ICU RN only) 07/01/24 1646  Dressing Change Due 07/08/24 07/01/24 1646       Jordan [name] 07/01/2024, 4:46 PM

## 2024-07-01 NOTE — Plan of Care (Signed)
  Problem: Pain Managment: Goal: General experience of comfort will improve and/or be controlled Outcome: Progressing   Problem: Safety: Goal: Ability to remain free from injury will improve Outcome: Progressing

## 2024-07-01 NOTE — Progress Notes (Addendum)
 5 Days Post-Op   Subjective/Chief Complaint: Complains of abdominal pain   Objective: Vital signs in last 24 hours: Temp:  [98.1 F (36.7 C)-98.9 F (37.2 C)] 98.2 F (36.8 C) (07/06 0755) Pulse Rate:  [69-88] 87 (07/06 0755) Resp:  [13-18] 18 (07/06 0755) BP: (120-154)/(68-108) 120/68 (07/06 0755) SpO2:  [100 %] 100 % (07/06 0632) FiO2 (%):  [24 %] 24 % (07/06 0632) Last BM Date : 06/30/24  Intake/Output from previous day: 07/05 0701 - 07/06 0700 In: 2107.8 [P.O.:720; I.V.:1000; NG/GT:240; IV Piggyback:147.8] Out: 2035 [Urine:1125; Emesis/NG output:900; Drains:10] Intake/Output this shift: Total I/O In: -  Out: 300 [Stool:300]  General appearance: alert and cooperative Resp: clear to auscultation bilaterally Cardio: regular rate and rhythm GI: moderate diffuse tenderness  Lab Results:  Recent Labs    06/30/24 0854 07/01/24 0529  WBC 8.9 8.1  HGB 12.9* 11.0*  HCT 42.1 34.3*  PLT 270 237   BMET Recent Labs    06/30/24 0854 07/01/24 0529  NA 146* 136  K 3.8 3.4*  CL 106 101  CO2 28 26  GLUCOSE 99 121*  BUN 8 <5*  CREATININE 1.16 0.82  CALCIUM 9.0 8.3*   PT/INR No results for input(s): LABPROT, INR in the last 72 hours. ABG No results for input(s): PHART, HCO3 in the last 72 hours.  Invalid input(s): PCO2, PO2  Studies/Results: CT ABDOMEN PELVIS W CONTRAST Result Date: 07/01/2024 CLINICAL DATA:  43 year old male with abdominal pain. Abnormal sigmoid ostomy with large parastomal hernia on CT Abdomen and Pelvis 5 days ago. . Now ostoperative day 5 status post laparotomy with lysis of adhesions, small-bowel resection, repair of parastomal hernia, revision of colostomy. EXAM: CT ABDOMEN AND PELVIS WITH CONTRAST TECHNIQUE: Multidetector CT imaging of the abdomen and pelvis was performed using the standard protocol following bolus administration of intravenous contrast. RADIATION DOSE REDUCTION: This exam was performed according to the departmental  dose-optimization program which includes automated exposure control, adjustment of the mA and/or kV according to patient size and/or use of iterative reconstruction technique. CONTRAST:  OMNIPAQUE  IOHEXOL  350 MG/ML SOLN COMPARISON:  CT Abdomen and Pelvis 06/26/2024 and earlier. FINDINGS: Lower chest: Gynecomastia. Heart size remains normal. No pericardial or pleural effusion. Mild lung base atelectasis. Hepatobiliary: Probable vicarious contrast excretion to the gallbladder. Layering sludge or small stones less likely. No pericholecystic inflammation. Liver enhancement within normal limits. No bile duct enlargement. Pancreas: Stable partial atrophy. Spleen: Negative. Adrenals/Urinary Tract: Normal adrenal glands. Nonobstructed kidneys, symmetric renal enhancement and early contrast excretion. Diminutive ureters. Diminutive but thick-walled urinary bladder. Stomach/Bowel: New postoperative changes to the ventral abdominal wall with a midline wound healing by secondary intention. Postoperative changes to the left abdominal wall, site of previous parastomal hernia. Descending colostomy loop there now is nondilated. Regional postoperative appearing subcutaneous gas. A percutaneous operative drain is in place and terminates at an amorphous mixed density collection which appears to be hematoma, confluent portion of which is 39 x 82 x 38 mm (AP by transverse by CC), 60 mL (coronal image 152 and series 3, image 53. Blind-ending rectum within the pelvis appears unchanged. Staple line associated with mildly dilated air and fluid containing small bowel loops in the lower abdomen (series 3, image 74). And tracking from the left lower quadrant staple line into the deep pelvis is partially organized collection of mesenteric gas and fluid. Small volume mesenteric gas demonstrated on coronal images 104 and 113. Adjacent fluid and air containing triangular fluid collection occupying the central pelvic inlet on  series 3 image  77 is 75 x 61 x 54 mm (AP by transverse by CC), 120 mL. Surrounding mesenteric inflammation. No free intraperitoneal air. Proximal jejunum decompressed. Terminal ileum distal to the anastomosis is decompressed. Small bowel loops upstream of the anastomosis are mildly dilated. Air in fluid mildly distending the large bowel to the ostomy. Mild free fluid in the left gutter. Vascular/Lymphatic: Major arterial structures, portal venous system appear patent. Suboptimal intravascular contrast. Reactive appearing pelvic inlet lymph nodes, individually up to 10 mm short axis and only mildly increased from the preoperative CT. Reproductive: Negative. Other: No free fluid in the pelvis. Musculoskeletal: No acute osseous abnormality identified. Increased bilateral body wall and flank edema. IMPRESSION: 1. Interval postoperative changes from laparotomy, repair of parastomal hernia, and revision of distal colostomy. 2. Positive for 120 mL organizing air and fluid collection at the pelvic inlet, the distal small bowel mesentery and adjacent to distal small bowel anastomotic staple line (series 3, image 77). Anastomotic Leak or developing postop Abscess not excluded. Upstream mild small-bowel obstruction or ileus. Terminal ileum distal to the anastomosis is decompressed. 3. Smaller 60 mL hematoma/seroma at the left ventral abdominal wall operative site from #1, with postoperative drain in place. 4. Air in fluid throughout small and large bowel loops compatible with a degree of Enteritis, Diarrhea. 5. Increased body wall and flank edema. Electronically Signed   By: VEAR Hurst M.D.   On: 07/01/2024 04:43    Anti-infectives: Anti-infectives (From admission, onward)    Start     Dose/Rate Route Frequency Ordered Stop   06/27/24 0200  piperacillin -tazobactam (ZOSYN ) IVPB 3.375 g        3.375 g 12.5 mL/hr over 240 Minutes Intravenous Every 8 hours 06/27/24 0113 07/02/24 0559   06/26/24 2130  piperacillin -tazobactam (ZOSYN ) IVPB  3.375 g  Status:  Discontinued        3.375 g 12.5 mL/hr over 240 Minutes Intravenous Every 8 hours 06/26/24 2117 06/27/24 0113   06/26/24 0830  cefoTEtan  (CEFOTAN ) 2 g in sodium chloride  0.9 % 100 mL IVPB        2 g 200 mL/hr over 30 Minutes Intravenous On call to O.R. 06/26/24 0820 06/26/24 1335       Assessment/Plan: s/p Procedure(s) with comments: LAPAROTOMY, EXPLORATORY (N/A) - Lysis of adhesions, small bowel resection, repair of paristomal hernia, revision of decending colostomy CT last night shows small fluid collection near anastamosis. This could be evidence of a leak but no oral contrast given. Will repeat CT with just oral contrast today to rule out leak. I do not think he is leaking since his wbc and temp are normal. Will check u/a since he has pain with urination as well Continue wound care Ostomy functioning well POD 5 Will ask IR to evaluate for possible drain of intraabd fluid as well as left flank fluid  LOS: 5 days    Deward Null III 07/01/2024

## 2024-07-01 NOTE — Progress Notes (Signed)
 Remote floor coverage requested bedside evaluation of this patient.  RN reported patient having swollen, red, painful area on his abdomen and took a picture.    Patient seen and examined at bedside.  His wife is also present at bedside.  He is complaining of increasing abdominal pain since yesterday which is generalized but mostly in the area of his ostomy.  He has had nausea but no episodes of vomiting.  He is also concerned that his JP drain has not had any drainage since 7 PM.  Patient's vital signs are stable.  Not febrile, tachycardic, or hypotensive.  On examination, abdomen is soft with hypoactive bowel sounds and generalized tenderness to palpation.  No peritoneal signs.  Approximately 5 mL serosanguineous drainage noted from JP drain.  Minimal/scant fecal material in ostomy bag.  Area of erythema noted on the left side of the abdomen which is currently cool to touch as patient was applying an ice pack to the area.  Informed by RN that she has already notified general surgery Dr. Sebastian and he will see the patient in the morning.  Patient is already on Zosyn .  I have ordered stat CT abdomen pelvis for further evaluation.  Continue pain management.

## 2024-07-01 NOTE — Progress Notes (Signed)
 PROGRESS NOTE  Jordan [name] [place] III FMW:[id number] DOB: 1981/09/14   PCP: [name], MD  Patient is from: Home.  Lives with wife.  DOA: 06/26/2024 LOS: 5  Chief complaints Chief Complaint  Patient presents with   Emesis     Brief Narrative / Interim history: 43 year old M with PMH of diverticulitis complicated by incarcerated hernia s/p colostomy in 2021, OSA not on CPAP, HTN, morbid obesity, reactive airway disease and prediabetes presented to ED due to left-sided abdominal pain after lifting heavy case of water with associated nausea and vomiting, and admitted with small bowel obstruction with possible incarceration and strangulation in the setting of large parastomal hernia containing sigmoid colon and a small bowel.  Lactic acid elevated to 3.4. Patient was taken to the OR and had ex lap with LOA, small bowel resection,  repair of parastomal hernia and revision of descending colostomy by Dr. Curvin on 7/1.  Postoperatively, started on PCA for pain control.   Subjective: Seen and examined earlier this morning.  Some concern about LLQ pain, erythema and swelling last night.  CT abdomen and pelvis showed fluid collection near anastomosis and body wall/flank edema.  Patient states there is no significant output from JP drain and is concerned about infection.  Has no fever or leukocytosis.  Also reports some pain with urination.  He is on IV Zosyn .  Objective: Vitals:   07/01/24 0450 07/01/24 0632 07/01/24 0755 07/01/24 1152  BP: (!) 143/83  120/68   Pulse: 88  87   Resp: 18 18 18 14   Temp: 98.9 F (37.2 C)  98.2 F (36.8 C)   TempSrc: Oral  Oral   SpO2: 100% 100%  98%  Weight:      Height:        Examination:  GENERAL: No apparent distress.  Nontoxic. HEENT: MMM.  Vision and hearing grossly intact.  NGT in place. NECK: Supple.  No apparent JVD.  RESP:  No IWOB.  Fair aeration bilaterally. CVS:  RRR. Heart sounds normal.  ABD/GI/GU: BS+. Abd soft.  Colostomy  empty.  Appropriately tender.  Dressing over laparotomy wound.  Skin erythema over LLQ.  JP drain output small and serosanguineous. MSK/EXT:  Moves extremities. No apparent deformity. No edema.  SKIN: no apparent skin lesion or wound NEURO: Awake and alert.  Oriented x 4.  No apparent focal neuro deficit. PSYCH: Calm and sleeping.  Consultants:  General surgery  Procedures: 7/1-ex lap with LOA, small bowel resection,  repair of parastomal hernia and revision of descending colostomy by Dr. Curvin  Microbiology summarized: None  Assessment and plan: Small Bowel Obstruction presents with sudden onset lower abdominal pain with nausea and vomiting.  CT suggested SBO with possible incarceration and strangulation in the setting of large parastomal hernia containing sigmoid colon and a small bowel.  Patient was taken to the OR and had ex lap with LOA, small bowel resection,  repair of parastomal hernia and revision of descending colostomy by Dr. Curvin on 7/1.  Repeat CT abdomen and pelvis on 7/6 with small fluid near anastomosis and left flank. -Postoperative care per general surgery. -Plan for repeat CT with oral contrast to rule out leak -Wound care and ostomy care.  Surgery to ask IR for possible drain of intra-abdominal fluid -Continue n.p.o., IVF and IV Zosyn  -Continue PPI and IV fluid. -Pain control per surgery.    Hypertensive urgency/sinus tachycardia: Resolved. -Pain control -IV hydralazine  PRN elevated BP -IV metoprolol  5 mg every 8 hours.  AKI:  b/l Cr 0.9-1.0.  AKI resolved. Recent Labs    11/07/23 0723 11/08/23 0624 11/09/23 0533 02/06/24 0850 06/26/24 0226 06/27/24 0630 06/28/24 9364 06/29/24 0622 06/30/24 0854 07/01/24 0529  BUN 7 7 5* 9 7 5* 7 8 8  <5*  CREATININE 1.00 1.07 0.94 0.90 1.03 1.06 1.32* 0.89 1.16 0.82  -Continue IVF  -Recheck in the morning    High AGMA: Due to #1.  Resolved.   Hyperglycemia: A1c 5.6%.  BMP glucose within acceptable range    Obstructive sleep apnea not on CPAP. -Supplemental oxygen nightly  Leukocytosis likely due to #1 and demargination.  Resolved. - On IV Zosyn   Hypokalemia -Monitor replenish K and Mg as appropriate  Dysuria?  He is already on IV Zosyn .  UA negative.  Probably from prior Foley  Morbid obesity Body mass index is 44.87 kg/m.           DVT prophylaxis:  Place and maintain sequential compression device Start: 07/01/24 0609 SCD's Start: 06/26/24 2118  Code Status: Full code Family Communication: None at bedside today. Level of care: Med-Surg Status is: Inpatient Remains inpatient appropriate because: Small bowel obstruction   Final disposition: To be determined   35 minutes with more than 50% spent in reviewing records, counseling patient/family and coordinating care.   Sch Meds:  Scheduled Meds:  Chlorhexidine  Gluconate Cloth  6 each Topical Q0600   metoprolol  tartrate  5 mg Intravenous Q8H   morphine    Intravenous Q4H   pantoprazole  (PROTONIX ) IV  40 mg Intravenous QHS   sodium hypochlorite   Irrigation BID   Continuous Infusions:  piperacillin -tazobactam (ZOSYN )  IV 3.375 g (07/01/24 0644)   potassium chloride  Stopped (07/01/24 1154)   PRN Meds:.albuterol , chlorproMAZINE  (THORAZINE ) injection, diphenhydrAMINE  **OR** diphenhydrAMINE , hydrALAZINE , methocarbamol  (ROBAXIN ) injection, naloxone  **AND** sodium chloride  flush, ondansetron  **OR** ondansetron  (ZOFRAN ) IV, mouth rinse  Antimicrobials: Anti-infectives (From admission, onward)    Start     Dose/Rate Route Frequency Ordered Stop   06/27/24 0200  piperacillin -tazobactam (ZOSYN ) IVPB 3.375 g        3.375 g 12.5 mL/hr over 240 Minutes Intravenous Every 8 hours 06/27/24 0113 07/02/24 0559   06/26/24 2130  piperacillin -tazobactam (ZOSYN ) IVPB 3.375 g  Status:  Discontinued        3.375 g 12.5 mL/hr over 240 Minutes Intravenous Every 8 hours 06/26/24 2117 06/27/24 0113   06/26/24 0830  cefoTEtan  (CEFOTAN ) 2 g  in sodium chloride  0.9 % 100 mL IVPB        2 g 200 mL/hr over 30 Minutes Intravenous On call to O.R. 06/26/24 0820 06/26/24 1335        I have personally reviewed the following labs and images: CBC: Recent Labs  Lab 06/26/24 0226 06/26/24 1639 06/27/24 0630 06/28/24 0635 06/29/24 0622 06/30/24 0854 07/01/24 0529  WBC 9.8  --  20.4* 15.3* 8.9 8.9 8.1  NEUTROABS 4.4  --   --  12.8*  --   --   --   HGB 16.2   < > 14.6 13.3 13.0 12.9* 11.0*  HCT 51.6   < > 45.5 44.4 42.6 42.1 34.3*  MCV 86.3  --  86.7 91.4 88.8 88.1 85.5  PLT 333  --  267 225 209 270 237   < > = values in this interval not displayed.   BMP &GFR Recent Labs  Lab 06/27/24 0630 06/28/24 0635 06/29/24 0622 06/30/24 0854 07/01/24 0529  NA 144 141 145 146* 136  K 4.0 4.7 4.2 3.8 3.4*  CL  106 105 106 106 101  CO2 27 24 28 28 26   GLUCOSE 149* 119* 111* 99 121*  BUN 5* 7 8 8  <5*  CREATININE 1.06 1.32* 0.89 1.16 0.82  CALCIUM 8.4* 8.6* 8.7* 9.0 8.3*  MG 2.0 2.1 2.1 2.3 1.8  PHOS  --  3.0 2.8 2.9 2.9   Estimated Creatinine Clearance: 144.2 mL/min (by C-G formula based on SCr of 0.82 mg/dL). Liver & Pancreas: Recent Labs  Lab 06/26/24 0226 06/28/24 9364 06/29/24 0622 06/30/24 0854 07/01/24 0529  AST 29 48* 32 28  --   ALT 17 23 20 21   --   ALKPHOS 60 60 55 54  --   BILITOT 0.8 1.1 1.1 1.0  --   PROT 7.8 6.1* 6.3* 7.2  --   ALBUMIN  4.2 2.9* 2.8* 3.1* 2.5*   Recent Labs  Lab 06/26/24 0226  LIPASE 33   No results for input(s): AMMONIA in the last 168 hours. Diabetic: No results for input(s): HGBA1C in the last 72 hours.  Recent Labs  Lab 06/30/24 1948 07/01/24 0027 07/01/24 0448 07/01/24 0753 07/01/24 1149  GLUCAP 129* 123* 106* 112* 100*   Cardiac Enzymes: No results for input(s): CKTOTAL, CKMB, CKMBINDEX, TROPONINI in the last 168 hours. No results for input(s): PROBNP in the last 8760 hours. Coagulation Profile: No results for input(s): INR, PROTIME in the last  168 hours. Thyroid  Function Tests: No results for input(s): TSH, T4TOTAL, FREET4, T3FREE, THYROIDAB in the last 72 hours. Lipid Profile: No results for input(s): CHOL, HDL, LDLCALC, TRIG, CHOLHDL, LDLDIRECT in the last 72 hours. Anemia Panel: No results for input(s): VITAMINB12, FOLATE, FERRITIN, TIBC, IRON, RETICCTPCT in the last 72 hours. Urine analysis:    Component Value Date/Time   COLORURINE YELLOW 11/05/2023 2314   APPEARANCEUR HAZY (A) 11/05/2023 2314   APPEARANCEUR Hazy (A) 09/12/2020 1058   LABSPEC 1.019 11/05/2023 2314   PHURINE 6.0 11/05/2023 2314   GLUCOSEU NEGATIVE 11/05/2023 2314   HGBUR NEGATIVE 11/05/2023 2314   BILIRUBINUR NEGATIVE 11/05/2023 2314   BILIRUBINUR Negative 08/27/2020 1332   KETONESUR 20 (A) 11/05/2023 2314   PROTEINUR 100 (A) 11/05/2023 2314   UROBILINOGEN 0.2 05/04/2020 1400   NITRITE NEGATIVE 11/05/2023 2314   LEUKOCYTESUR NEGATIVE 11/05/2023 2314   Sepsis Labs: Invalid input(s): PROCALCITONIN, LACTICIDVEN  Microbiology: No results found for this or any previous visit (from the past 240 hours).  Radiology Studies: CT ABDOMEN PELVIS W CONTRAST Result Date: 07/01/2024 CLINICAL DATA:  43 year old male with abdominal pain. Abnormal sigmoid ostomy with large parastomal hernia on CT Abdomen and Pelvis 5 days ago. . Now ostoperative day 5 status post laparotomy with lysis of adhesions, small-bowel resection, repair of parastomal hernia, revision of colostomy. EXAM: CT ABDOMEN AND PELVIS WITH CONTRAST TECHNIQUE: Multidetector CT imaging of the abdomen and pelvis was performed using the standard protocol following bolus administration of intravenous contrast. RADIATION DOSE REDUCTION: This exam was performed according to the departmental dose-optimization program which includes automated exposure control, adjustment of the mA and/or kV according to patient size and/or use of iterative reconstruction technique. CONTRAST:   OMNIPAQUE  IOHEXOL  350 MG/ML SOLN COMPARISON:  CT Abdomen and Pelvis 06/26/2024 and earlier. FINDINGS: Lower chest: Gynecomastia. Heart size remains normal. No pericardial or pleural effusion. Mild lung base atelectasis. Hepatobiliary: Probable vicarious contrast excretion to the gallbladder. Layering sludge or small stones less likely. No pericholecystic inflammation. Liver enhancement within normal limits. No bile duct enlargement. Pancreas: Stable partial atrophy. Spleen: Negative. Adrenals/Urinary Tract: Normal adrenal glands. Nonobstructed  kidneys, symmetric renal enhancement and early contrast excretion. Diminutive ureters. Diminutive but thick-walled urinary bladder. Stomach/Bowel: New postoperative changes to the ventral abdominal wall with a midline wound healing by secondary intention. Postoperative changes to the left abdominal wall, site of previous parastomal hernia. Descending colostomy loop there now is nondilated. Regional postoperative appearing subcutaneous gas. A percutaneous operative drain is in place and terminates at an amorphous mixed density collection which appears to be hematoma, confluent portion of which is 39 x 82 x 38 mm (AP by transverse by CC), 60 mL (coronal image 152 and series 3, image 53. Blind-ending rectum within the pelvis appears unchanged. Staple line associated with mildly dilated air and fluid containing small bowel loops in the lower abdomen (series 3, image 74). And tracking from the left lower quadrant staple line into the deep pelvis is partially organized collection of mesenteric gas and fluid. Small volume mesenteric gas demonstrated on coronal images 104 and 113. Adjacent fluid and air containing triangular fluid collection occupying the central pelvic inlet on series 3 image 77 is 75 x 61 x 54 mm (AP by transverse by CC), 120 mL. Surrounding mesenteric inflammation. No free intraperitoneal air. Proximal jejunum decompressed. Terminal ileum distal to the  anastomosis is decompressed. Small bowel loops upstream of the anastomosis are mildly dilated. Air in fluid mildly distending the large bowel to the ostomy. Mild free fluid in the left gutter. Vascular/Lymphatic: Major arterial structures, portal venous system appear patent. Suboptimal intravascular contrast. Reactive appearing pelvic inlet lymph nodes, individually up to 10 mm short axis and only mildly increased from the preoperative CT. Reproductive: Negative. Other: No free fluid in the pelvis. Musculoskeletal: No acute osseous abnormality identified. Increased bilateral body wall and flank edema. IMPRESSION: 1. Interval postoperative changes from laparotomy, repair of parastomal hernia, and revision of distal colostomy. 2. Positive for 120 mL organizing air and fluid collection at the pelvic inlet, the distal small bowel mesentery and adjacent to distal small bowel anastomotic staple line (series 3, image 77). Anastomotic Leak or developing postop Abscess not excluded. Upstream mild small-bowel obstruction or ileus. Terminal ileum distal to the anastomosis is decompressed. 3. Smaller 60 mL hematoma/seroma at the left ventral abdominal wall operative site from #1, with postoperative drain in place. 4. Air in fluid throughout small and large bowel loops compatible with a degree of Enteritis, Diarrhea. 5. Increased body wall and flank edema. Electronically Signed   By: VEAR Hurst M.D.   On: 07/01/2024 04:43      Fatmata Legere T. Andreu Drudge [facility]  If 7PM-7AM, please contact night-coverage [email or web address] 07/01/2024, 12:14 PM

## 2024-07-01 NOTE — Progress Notes (Signed)
 Patient complained of left lower abdominal pain. It is swollen painful and red.Uploaded a picture in media.MD made aware.Jp drain was inflated and when I tried to squeeze it ,did not suction and was inflated again.SABRA

## 2024-07-02 DIAGNOSIS — K56609 Unspecified intestinal obstruction, unspecified as to partial versus complete obstruction: Secondary | ICD-10-CM | POA: Diagnosis not present

## 2024-07-02 LAB — CBC
HCT: 34.1 % — ABNORMAL LOW (ref 39.0–52.0)
Hemoglobin: 10.7 g/dL — ABNORMAL LOW (ref 13.0–17.0)
MCH: 27.6 pg (ref 26.0–34.0)
MCHC: 31.4 g/dL (ref 30.0–36.0)
MCV: 87.9 fL (ref 80.0–100.0)
Platelets: 269 K/uL (ref 150–400)
RBC: 3.88 MIL/uL — ABNORMAL LOW (ref 4.22–5.81)
RDW: 13.8 % (ref 11.5–15.5)
WBC: 9.7 K/uL (ref 4.0–10.5)
nRBC: 0 % (ref 0.0–0.2)

## 2024-07-02 LAB — RENAL FUNCTION PANEL
Albumin: 2.3 g/dL — ABNORMAL LOW (ref 3.5–5.0)
Anion gap: 9 (ref 5–15)
BUN: <5 mg/dL — ABNORMAL LOW (ref 6–20)
CO2: 25 mmol/L (ref 22–32)
Calcium: 8.3 mg/dL — ABNORMAL LOW (ref 8.9–10.3)
Chloride: 107 mmol/L (ref 98–111)
Creatinine, Ser: 0.79 mg/dL (ref 0.61–1.24)
GFR, Estimated: >60 mL/min (ref >60)
Glucose, Bld: 184 mg/dL — ABNORMAL HIGH (ref 70–99)
Phosphorus: 3.4 mg/dL (ref 2.5–4.6)
Potassium: 3.7 mmol/L (ref 3.5–5.1)
Sodium: 141 mmol/L (ref 135–145)

## 2024-07-02 LAB — GLUCOSE, CAPILLARY
Glucose-Capillary: 117 mg/dL — ABNORMAL HIGH (ref 70–99)
Glucose-Capillary: 123 mg/dL — ABNORMAL HIGH (ref 70–99)
Glucose-Capillary: 97 mg/dL (ref 70–99)

## 2024-07-02 LAB — MAGNESIUM: Magnesium: 2.1 mg/dL (ref 1.7–2.4)

## 2024-07-02 MED ORDER — OXYCODONE HCL 5 MG PO TABS
5.0000 mg | ORAL_TABLET | ORAL | Status: DC | PRN
  Administered 2024-07-02 – 2024-07-03 (×5): 10 mg via ORAL
  Filled 2024-07-02 (×5): qty 2

## 2024-07-02 MED ORDER — HYDROMORPHONE HCL 1 MG/ML IJ SOLN
1.0000 mg | INTRAMUSCULAR | Status: DC | PRN
  Administered 2024-07-02 – 2024-07-06 (×16): 1 mg via INTRAVENOUS
  Filled 2024-07-02 (×16): qty 1

## 2024-07-02 MED ORDER — PIPERACILLIN-TAZOBACTAM 3.375 G IVPB
3.3750 g | Freq: Three times a day (TID) | INTRAVENOUS | Status: DC
  Administered 2024-07-02 – 2024-07-07 (×15): 3.375 g via INTRAVENOUS
  Filled 2024-07-02 (×15): qty 50

## 2024-07-02 NOTE — Progress Notes (Addendum)
 6 Days Post-Op   Subjective/Chief Complaint: Continues to complain of abdominal pain   Objective: Vital signs in last 24 hours: Temp:  [98.2 F (36.8 C)-98.7 F (37.1 C)] 98.6 F (37 C) (07/07 0534) Pulse Rate:  [87-98] 90 (07/07 0534) Resp:  [14-20] 19 (07/07 0534) BP: (120-158)/(68-101) 158/101 (07/07 0534) SpO2:  [96 %-100 %] 100 % (07/07 0534) FiO2 (%):  [24 %] 24 % (07/07 0419) Last BM Date : 07/01/24  Intake/Output from previous day: 07/06 0701 - 07/07 0700 In: 1572.8 [P.O.:480; I.V.:980.4; IV Piggyback:112.4] Out: 4870 [Urine:4250; Drains:20; Stool:600] Intake/Output this shift: No intake/output data recorded.  Gen: NAD CV: RRR Pulm: NWOB on room air Abd: Soft, mild tenderness to palpation around ostomy and midline surgical site, drain with minimal output, SS  Lab Results:  Recent Labs    07/01/24 0529 07/02/24 0323  WBC 8.1 9.7  HGB 11.0* 10.7*  HCT 34.3* 34.1*  PLT 237 269   BMET Recent Labs    07/01/24 0529 07/02/24 0323  NA 136 141  K 3.4* 3.7  CL 101 107  CO2 26 25  GLUCOSE 121* 184*  BUN <5* <5*  CREATININE 0.82 0.79  CALCIUM 8.3* 8.3*   PT/INR No results for input(s): LABPROT, INR in the last 72 hours. ABG No results for input(s): PHART, HCO3 in the last 72 hours.  Invalid input(s): PCO2, PO2  Studies/Results: CT ABDOMEN PELVIS WO CONTRAST Result Date: 07/01/2024 CLINICAL DATA:  Postop abdomen pain EXAM: CT ABDOMEN AND PELVIS WITHOUT CONTRAST TECHNIQUE: Multidetector CT imaging of the abdomen and pelvis was performed following the standard protocol without IV contrast. RADIATION DOSE REDUCTION: This exam was performed according to the departmental dose-optimization program which includes automated exposure control, adjustment of the mA and/or kV according to patient size and/or use of iterative reconstruction technique. COMPARISON:  CT 07/01/2024, 06/26/2024, 11/06/2023 FINDINGS: Lower chest: Lung bases demonstrate mild  dependent atelectasis. Gynecomastia. Hepatobiliary: Probable vicarious excretion of contrast in the gallbladder. No biliary dilatation. Pancreas: Unremarkable. No pancreatic ductal dilatation or surrounding inflammatory changes. Spleen: Normal in size without focal abnormality. Adrenals/Urinary Tract: Adrenal glands are normal. Kidneys show no hydronephrosis. The bladder is unremarkable Stomach/Bowel: Small volume enteral contrast in the stomach. Enteral contrast within small and large bowel. Fluid distension of small bowel loops measuring up to 4.8 cm. Left abdominal colostomy, small amount of contrast has reached the colostomy bag. Rectal pouch containing small amount of high density material, unchanged. Postsurgical changes of the small bowel with staple line in the left pelvis. Mild areas of small-bowel wall thickening. Redemonstrated triangular shaped fluid collection in the central pelvis, measures approximately 6.8 x 6 cm on series 3, image 73. Small amount of hyperdense material in the collection dependently, series 3, image 74. Overall density value not consistent with simple fluid. Density value measures slightly higher than on the CT performed earlier today when there was no enteral contrast present. Small gas bubbles within the fluid collection and adjacent to the suture line. Persistent gas and soft tissue stranding adjacent to the left abdominal colostomy, likely postoperative. Drainage catheter with tip in the subcutaneous soft tissues. Slightly dense collection in the subcutaneous soft tissues of the left abdominal wall, partially surrounds the drainage catheter, this collection measures 7.9 x 3.9 cm and is suspect for a hematoma. Vascular/Lymphatic: Aortic atherosclerosis. No enlarged abdominal or pelvic lymph nodes. Reproductive: Hysterectomy.  No suspicious adnexal mass Other: Small volume free fluid in the pelvis. Generalized subcutaneous edema. Soft tissue stranding within the mesentery,  could  be due to resolving postoperative change. Musculoskeletal: No acute or suspicious osseous abnormality IMPRESSION: 1. Postop changes corresponding to history of repair of small bowel resection, left abdominal parastomal hernia and revision of distal colostomy. Interval administration of enteral contrast. Some dilated loops of small bowel but contrast has reached the ostomy bag and findings favor postoperative ileus. 2. 6.8 cm triangular shaped fluid collection in the central pelvis, small amount of hyperdense material in the collection dependently which was present on the CT performed earlier today. Small gas bubbles within the fluid collection and adjacent to the small bowel suture line. Fluid collection in the pelvis measures slightly more dense compared to the examination performed earlier today without contrast. Differential considerations include postoperative fluid collection or possible hematoma with small foci of postoperative gas in the collection and adjacent to the anastomosis. Given slight change in density value of the fluid collection compared to the exam earlier today, postoperative leak at the small bowel suture line in the pelvis is also a consideration. 3. 7.9 cm slightly dense collection in the subcutaneous soft tissues of the left abdominal wall, partially surrounds the drainage catheter, suspect for a hematoma. 4. Small volume free fluid in the pelvis. Generalized subcutaneous edema. 5. Aortic atherosclerosis. Aortic Atherosclerosis (ICD10-I70.0). Electronically Signed   By: Luke Bun M.D.   On: 07/01/2024 15:51   US  EKG SITE RITE Result Date: 07/01/2024 If Site Rite image not attached, placement could not be confirmed due to current cardiac rhythm.  CT ABDOMEN PELVIS W CONTRAST Result Date: 07/01/2024 CLINICAL DATA:  43 year old male with abdominal pain. Abnormal sigmoid ostomy with large parastomal hernia on CT Abdomen and Pelvis 5 days ago. . Now ostoperative day 5 status post  laparotomy with lysis of adhesions, small-bowel resection, repair of parastomal hernia, revision of colostomy. EXAM: CT ABDOMEN AND PELVIS WITH CONTRAST TECHNIQUE: Multidetector CT imaging of the abdomen and pelvis was performed using the standard protocol following bolus administration of intravenous contrast. RADIATION DOSE REDUCTION: This exam was performed according to the departmental dose-optimization program which includes automated exposure control, adjustment of the mA and/or kV according to patient size and/or use of iterative reconstruction technique. CONTRAST:  OMNIPAQUE  IOHEXOL  350 MG/ML SOLN COMPARISON:  CT Abdomen and Pelvis 06/26/2024 and earlier. FINDINGS: Lower chest: Gynecomastia. Heart size remains normal. No pericardial or pleural effusion. Mild lung base atelectasis. Hepatobiliary: Probable vicarious contrast excretion to the gallbladder. Layering sludge or small stones less likely. No pericholecystic inflammation. Liver enhancement within normal limits. No bile duct enlargement. Pancreas: Stable partial atrophy. Spleen: Negative. Adrenals/Urinary Tract: Normal adrenal glands. Nonobstructed kidneys, symmetric renal enhancement and early contrast excretion. Diminutive ureters. Diminutive but thick-walled urinary bladder. Stomach/Bowel: New postoperative changes to the ventral abdominal wall with a midline wound healing by secondary intention. Postoperative changes to the left abdominal wall, site of previous parastomal hernia. Descending colostomy loop there now is nondilated. Regional postoperative appearing subcutaneous gas. A percutaneous operative drain is in place and terminates at an amorphous mixed density collection which appears to be hematoma, confluent portion of which is 39 x 82 x 38 mm (AP by transverse by CC), 60 mL (coronal image 152 and series 3, image 53. Blind-ending rectum within the pelvis appears unchanged. Staple line associated with mildly dilated air and fluid  containing small bowel loops in the lower abdomen (series 3, image 74). And tracking from the left lower quadrant staple line into the deep pelvis is partially organized collection of mesenteric gas and fluid. Small  volume mesenteric gas demonstrated on coronal images 104 and 113. Adjacent fluid and air containing triangular fluid collection occupying the central pelvic inlet on series 3 image 77 is 75 x 61 x 54 mm (AP by transverse by CC), 120 mL. Surrounding mesenteric inflammation. No free intraperitoneal air. Proximal jejunum decompressed. Terminal ileum distal to the anastomosis is decompressed. Small bowel loops upstream of the anastomosis are mildly dilated. Air in fluid mildly distending the large bowel to the ostomy. Mild free fluid in the left gutter. Vascular/Lymphatic: Major arterial structures, portal venous system appear patent. Suboptimal intravascular contrast. Reactive appearing pelvic inlet lymph nodes, individually up to 10 mm short axis and only mildly increased from the preoperative CT. Reproductive: Negative. Other: No free fluid in the pelvis. Musculoskeletal: No acute osseous abnormality identified. Increased bilateral body wall and flank edema. IMPRESSION: 1. Interval postoperative changes from laparotomy, repair of parastomal hernia, and revision of distal colostomy. 2. Positive for 120 mL organizing air and fluid collection at the pelvic inlet, the distal small bowel mesentery and adjacent to distal small bowel anastomotic staple line (series 3, image 77). Anastomotic Leak or developing postop Abscess not excluded. Upstream mild small-bowel obstruction or ileus. Terminal ileum distal to the anastomosis is decompressed. 3. Smaller 60 mL hematoma/seroma at the left ventral abdominal wall operative site from #1, with postoperative drain in place. 4. Air in fluid throughout small and large bowel loops compatible with a degree of Enteritis, Diarrhea. 5. Increased body wall and flank edema.  Electronically Signed   By: VEAR Hurst M.D.   On: 07/01/2024 04:43    Anti-infectives: Anti-infectives (From admission, onward)    Start     Dose/Rate Route Frequency Ordered Stop   06/27/24 0200  piperacillin -tazobactam (ZOSYN ) IVPB 3.375 g        3.375 g 12.5 mL/hr over 240 Minutes Intravenous Every 8 hours 06/27/24 0113 07/02/24 0715   06/26/24 2130  piperacillin -tazobactam (ZOSYN ) IVPB 3.375 g  Status:  Discontinued        3.375 g 12.5 mL/hr over 240 Minutes Intravenous Every 8 hours 06/26/24 2117 06/27/24 0113   06/26/24 0830  cefoTEtan  (CEFOTAN ) 2 g in sodium chloride  0.9 % 100 mL IVPB        2 g 200 mL/hr over 30 Minutes Intravenous On call to O.R. 06/26/24 0820 06/26/24 1335       Assessment/Plan: s/p Procedure(s) with comments: LAPAROTOMY, EXPLORATORY (N/A) - Lysis of adhesions, small bowel resection, repair of paristomal hernia, revision of decending colostomy on 06/26/24 with Dr. Curvin  UA from yesterday negative Continue wound care Ostomy functioning well IR c/s to evaluate for possible drain of intraabd fluid as well as left flank fluid  Addendum: not amenable to IR drainage, will continue IV abx, ok for CLD.   LOS: 6 days    Richerd Silversmith 07/02/2024

## 2024-07-02 NOTE — Consult Note (Signed)
 WOC Nurse wound follow up Wound type:midline abdomen, surgical.  Noted green drainage and odor last week.  Completed short course of DAkins moist kerlix to wound bed.  Re-assessed and will begin NPWT again today as ordered.  Measurement: 21 cm x 11.2 cm x 5 cm  Wound bed: red, moist  Drainage (amount, consistency, odor) moderate serosanguinous  no odor noted today.  Periwound:Intact, LMQ colostomy, recent revision.   Dressing procedure/placement/frequency: Cleanse with NS and pat dry. Applied 2 pieces  black foam to wound bed. Cover with drape and trac pad . Seal achieved at 125 mmHg.  Change Monday/Thursday.  WOC Nurse ostomy follow up Stoma type/location: LMQ colostomy, recent revision Stomal assessment/size: oval, pale pink, moist, productive.  Flush at skin level  Independent with ostomy care Peristomal assessment: midline incision/peristomal creasing at 9 o'clock, use barrier ring to create flat surface.   Treatment  options for stomal/peristomal skin: barrier ring 2 piece convex Output soft brown stool Ostomy pouching: 2pc. coloplast  Education provided: None today Enrolled patient in [place] [employer] Discharge program:No  uses Coloplast Will follow.  Darice Cooley MSN, RN, FNP-BC [facility]ON Wound, Ostomy, Continence Nurse [facility] [phone] Pager [phone]

## 2024-07-02 NOTE — Progress Notes (Signed)
 PCA pump discontinued per order. 18mL wasted in pixis with [employer]

## 2024-07-02 NOTE — Progress Notes (Signed)
 PROGRESS NOTE  Jordan [name]  FMW:[id number] DOB: [birth year] DOA: 06/26/2024 PCP: [name], MD   Brief Narrative: Patient is a 43 year old male with history of diverticulitis complicated by incarcerated hernia status post colostomy in 2021, OSA not on CPAP, hypertension, morbid obesity, prediabetes who presented with left-sided abdominal pain after lifting heavy case of water, with associated  nausea and vomiting.  Imaging showed small bowel obstruction with possible incarceration and strangulation in the setting of large parastomal hernia containing sigmoid colon and small bowel.  Lactic acid was elevated on presentation.  General surgery consulted.  Taken to the OR and had expiratory laparotomy with LOA, small bowel resection, repair of parastomal hernia and revision of descending colostomy by Dr. Curvin on 7/1.  Started on PCA for pain control.  General surgery closely following.  Currently on IV Zosyn .  Assessment & Plan:  Principal Problem:   SBO (small bowel obstruction) (HCC) Active Problems:   Hypertensive urgency   Increased anion gap metabolic acidosis   Prediabetes  Small bowel obstruction: Presented with sudden onset of lower abdominal pain with nausea and vomiting. Imaging showed small bowel obstruction with possible incarceration and strangulation in the setting of large parastomal hernia containing sigmoid colon and small bowel.  Lactic acid was elevated on presentation.  General surgery consulted.  Taken to the OR and had expiratory laparotomy with LOA, small bowel resection, repair of parastomal hernia and revision of descending colostomy by Dr. Curvin on 7/1.  Started on PCA for pain control.  Follow-up CT abdomen/pelvis on 7/6 showed fluid collection near anastomosis, body wall/flank edema.  No fever or leukocytosis.  Currently on IV Zosyn .  IR was consulted to check if he is a candidate for IR drainage but as per IR, intra-abdominal collection is not amenable  to drainage due to location and surrounding structures commended conservative management.  Continue PPI, IV fluid.  Management as per general surgery.  Hypertensive urgency/sinus tachycardia: Resolved.  On IV metoprolol  every 8 hours.  AKI: Resolved with IV fluid  Hyperglycemia:recent  A1c 5.6.  Monitor blood sugars  OSA: Not on CPAP.  On supplemental oxygen nightly.  Leukocytosis: Resolved  Morbid obesity: BMI of 44.8         DVT prophylaxis:Place and maintain sequential compression device Start: 07/01/24 0609 SCD's Start: 06/26/24 2118     Code Status: Full Code  Family Communication: Cousin at bedside  Patient status:Inpatient  Patient is from :Home  Anticipated discharge un:Ynfz  Estimated DC date:not sure   Consultants: Surgery  Procedures: Exploratory laparatomy  Antimicrobials:  Anti-infectives (From admission, onward)    Start     Dose/Rate Route Frequency Ordered Stop   06/27/24 0200  piperacillin -tazobactam (ZOSYN ) IVPB 3.375 g        3.375 g 12.5 mL/hr over 240 Minutes Intravenous Every 8 hours 06/27/24 0113 07/02/24 0715   06/26/24 2130  piperacillin -tazobactam (ZOSYN ) IVPB 3.375 g  Status:  Discontinued        3.375 g 12.5 mL/hr over 240 Minutes Intravenous Every 8 hours 06/26/24 2117 06/27/24 0113   06/26/24 0830  cefoTEtan  (CEFOTAN ) 2 g in sodium chloride  0.9 % 100 mL IVPB        2 g 200 mL/hr over 30 Minutes Intravenous On call to O.R. 06/26/24 0820 06/26/24 1335       Subjective: Patient seen and examined at bedside today.  Hemodynamically stable.  Overall comfortable.  On PCA pump.  Lying in bed.  Continues to complain of abdominal  discomfort with abdominal drain noted leaking of fluid.  Abdomen is overall soft and nondistended.  Has bowel sounds.  Objective: Vitals:   07/02/24 0419 07/02/24 0534 07/02/24 0819 07/02/24 0836  BP:  (!) 158/101 127/70   Pulse:  90 80   Resp: 17 19 17 18   Temp:  98.6 F (37 C) 97.9 F (36.6 C)    TempSrc:  Oral Oral   SpO2: 98% 100% 100% 100%  Weight:      Height:        Intake/Output Summary (Last 24 hours) at 07/02/2024 0945 Last data filed at 07/02/2024 0830 Gross per 24 hour  Intake 1572.84 ml  Output 5570 ml  Net -3997.16 ml   Filed Weights   06/26/24 0214 06/26/24 0306 06/26/24 1140  Weight: 126.1 kg 126.1 kg 126.1 kg    Examination:  General exam: Overall comfortable, not in distress, morbidly obese HEENT: PERRL Respiratory system:  no wheezes or crackles  Cardiovascular system: S1 & S2 heard, RRR.  Gastrointestinal system: Abdomen is nondistended, soft, bowel sounds present.  Colostomy.  JP drain with a scant sanguinous fluid Central nervous system: Alert and oriented Extremities: No edema, no clubbing ,no cyanosis Skin: No rashes, no ulcers,no icterus     Data Reviewed: I have personally reviewed following labs and imaging studies  CBC: Recent Labs  Lab 06/26/24 0226 06/26/24 1639 06/28/24 0635 06/29/24 0622 06/30/24 0854 07/01/24 0529 07/02/24 0323  WBC 9.8   < > 15.3* 8.9 8.9 8.1 9.7  NEUTROABS 4.4  --  12.8*  --   --   --   --   HGB 16.2   < > 13.3 13.0 12.9* 11.0* 10.7*  HCT 51.6   < > 44.4 42.6 42.1 34.3* 34.1*  MCV 86.3   < > 91.4 88.8 88.1 85.5 87.9  PLT 333   < > 225 209 270 237 269   < > = values in this interval not displayed.   Basic Metabolic Panel: Recent Labs  Lab 06/28/24 0635 06/29/24 0622 06/30/24 0854 07/01/24 0529 07/02/24 0323  NA 141 145 146* 136 141  K 4.7 4.2 3.8 3.4* 3.7  CL 105 106 106 101 107  CO2 24 28 28 26 25   GLUCOSE 119* 111* 99 121* 184*  BUN 7 8 8  <5* <5*  CREATININE 1.32* 0.89 1.16 0.82 0.79  CALCIUM 8.6* 8.7* 9.0 8.3* 8.3*  MG 2.1 2.1 2.3 1.8 2.1  PHOS 3.0 2.8 2.9 2.9 3.4     No results found for this or any previous visit (from the past 240 hours).   Radiology Studies: CT ABDOMEN PELVIS WO CONTRAST Result Date: 07/01/2024 CLINICAL DATA:  Postop abdomen pain EXAM: CT ABDOMEN AND PELVIS WITHOUT  CONTRAST TECHNIQUE: Multidetector CT imaging of the abdomen and pelvis was performed following the standard protocol without IV contrast. RADIATION DOSE REDUCTION: This exam was performed according to the departmental dose-optimization program which includes automated exposure control, adjustment of the mA and/or kV according to patient size and/or use of iterative reconstruction technique. COMPARISON:  CT 07/01/2024, 06/26/2024, 11/06/2023 FINDINGS: Lower chest: Lung bases demonstrate mild dependent atelectasis. Gynecomastia. Hepatobiliary: Probable vicarious excretion of contrast in the gallbladder. No biliary dilatation. Pancreas: Unremarkable. No pancreatic ductal dilatation or surrounding inflammatory changes. Spleen: Normal in size without focal abnormality. Adrenals/Urinary Tract: Adrenal glands are normal. Kidneys show no hydronephrosis. The bladder is unremarkable Stomach/Bowel: Small volume enteral contrast in the stomach. Enteral contrast within small and large bowel. Fluid distension of small bowel  loops measuring up to 4.8 cm. Left abdominal colostomy, small amount of contrast has reached the colostomy bag. Rectal pouch containing small amount of high density material, unchanged. Postsurgical changes of the small bowel with staple line in the left pelvis. Mild areas of small-bowel wall thickening. Redemonstrated triangular shaped fluid collection in the central pelvis, measures approximately 6.8 x 6 cm on series 3, image 73. Small amount of hyperdense material in the collection dependently, series 3, image 74. Overall density value not consistent with simple fluid. Density value measures slightly higher than on the CT performed earlier today when there was no enteral contrast present. Small gas bubbles within the fluid collection and adjacent to the suture line. Persistent gas and soft tissue stranding adjacent to the left abdominal colostomy, likely postoperative. Drainage catheter with tip in the  subcutaneous soft tissues. Slightly dense collection in the subcutaneous soft tissues of the left abdominal wall, partially surrounds the drainage catheter, this collection measures 7.9 x 3.9 cm and is suspect for a hematoma. Vascular/Lymphatic: Aortic atherosclerosis. No enlarged abdominal or pelvic lymph nodes. Reproductive: Hysterectomy.  No suspicious adnexal mass Other: Small volume free fluid in the pelvis. Generalized subcutaneous edema. Soft tissue stranding within the mesentery, could be due to resolving postoperative change. Musculoskeletal: No acute or suspicious osseous abnormality IMPRESSION: 1. Postop changes corresponding to history of repair of small bowel resection, left abdominal parastomal hernia and revision of distal colostomy. Interval administration of enteral contrast. Some dilated loops of small bowel but contrast has reached the ostomy bag and findings favor postoperative ileus. 2. 6.8 cm triangular shaped fluid collection in the central pelvis, small amount of hyperdense material in the collection dependently which was present on the CT performed earlier today. Small gas bubbles within the fluid collection and adjacent to the small bowel suture line. Fluid collection in the pelvis measures slightly more dense compared to the examination performed earlier today without contrast. Differential considerations include postoperative fluid collection or possible hematoma with small foci of postoperative gas in the collection and adjacent to the anastomosis. Given slight change in density value of the fluid collection compared to the exam earlier today, postoperative leak at the small bowel suture line in the pelvis is also a consideration. 3. 7.9 cm slightly dense collection in the subcutaneous soft tissues of the left abdominal wall, partially surrounds the drainage catheter, suspect for a hematoma. 4. Small volume free fluid in the pelvis. Generalized subcutaneous edema. 5. Aortic  atherosclerosis. Aortic Atherosclerosis (ICD10-I70.0). Electronically Signed   By: Luke Bun M.D.   On: 07/01/2024 15:51   US  EKG SITE RITE Result Date: 07/01/2024 If Site Rite image not attached, placement could not be confirmed due to current cardiac rhythm.  CT ABDOMEN PELVIS W CONTRAST Result Date: 07/01/2024 CLINICAL DATA:  43 year old male with abdominal pain. Abnormal sigmoid ostomy with large parastomal hernia on CT Abdomen and Pelvis 5 days ago. . Now ostoperative day 5 status post laparotomy with lysis of adhesions, small-bowel resection, repair of parastomal hernia, revision of colostomy. EXAM: CT ABDOMEN AND PELVIS WITH CONTRAST TECHNIQUE: Multidetector CT imaging of the abdomen and pelvis was performed using the standard protocol following bolus administration of intravenous contrast. RADIATION DOSE REDUCTION: This exam was performed according to the departmental dose-optimization program which includes automated exposure control, adjustment of the mA and/or kV according to patient size and/or use of iterative reconstruction technique. CONTRAST:  OMNIPAQUE  IOHEXOL  350 MG/ML SOLN COMPARISON:  CT Abdomen and Pelvis 06/26/2024 and earlier. FINDINGS: Lower  chest: Gynecomastia. Heart size remains normal. No pericardial or pleural effusion. Mild lung base atelectasis. Hepatobiliary: Probable vicarious contrast excretion to the gallbladder. Layering sludge or small stones less likely. No pericholecystic inflammation. Liver enhancement within normal limits. No bile duct enlargement. Pancreas: Stable partial atrophy. Spleen: Negative. Adrenals/Urinary Tract: Normal adrenal glands. Nonobstructed kidneys, symmetric renal enhancement and early contrast excretion. Diminutive ureters. Diminutive but thick-walled urinary bladder. Stomach/Bowel: New postoperative changes to the ventral abdominal wall with a midline wound healing by secondary intention. Postoperative changes to the left abdominal wall,  site of previous parastomal hernia. Descending colostomy loop there now is nondilated. Regional postoperative appearing subcutaneous gas. A percutaneous operative drain is in place and terminates at an amorphous mixed density collection which appears to be hematoma, confluent portion of which is 39 x 82 x 38 mm (AP by transverse by CC), 60 mL (coronal image 152 and series 3, image 53. Blind-ending rectum within the pelvis appears unchanged. Staple line associated with mildly dilated air and fluid containing small bowel loops in the lower abdomen (series 3, image 74). And tracking from the left lower quadrant staple line into the deep pelvis is partially organized collection of mesenteric gas and fluid. Small volume mesenteric gas demonstrated on coronal images 104 and 113. Adjacent fluid and air containing triangular fluid collection occupying the central pelvic inlet on series 3 image 77 is 75 x 61 x 54 mm (AP by transverse by CC), 120 mL. Surrounding mesenteric inflammation. No free intraperitoneal air. Proximal jejunum decompressed. Terminal ileum distal to the anastomosis is decompressed. Small bowel loops upstream of the anastomosis are mildly dilated. Air in fluid mildly distending the large bowel to the ostomy. Mild free fluid in the left gutter. Vascular/Lymphatic: Major arterial structures, portal venous system appear patent. Suboptimal intravascular contrast. Reactive appearing pelvic inlet lymph nodes, individually up to 10 mm short axis and only mildly increased from the preoperative CT. Reproductive: Negative. Other: No free fluid in the pelvis. Musculoskeletal: No acute osseous abnormality identified. Increased bilateral body wall and flank edema. IMPRESSION: 1. Interval postoperative changes from laparotomy, repair of parastomal hernia, and revision of distal colostomy. 2. Positive for 120 mL organizing air and fluid collection at the pelvic inlet, the distal small bowel mesentery and adjacent to  distal small bowel anastomotic staple line (series 3, image 77). Anastomotic Leak or developing postop Abscess not excluded. Upstream mild small-bowel obstruction or ileus. Terminal ileum distal to the anastomosis is decompressed. 3. Smaller 60 mL hematoma/seroma at the left ventral abdominal wall operative site from #1, with postoperative drain in place. 4. Air in fluid throughout small and large bowel loops compatible with a degree of Enteritis, Diarrhea. 5. Increased body wall and flank edema. Electronically Signed   By: VEAR Hurst M.D.   On: 07/01/2024 04:43    Scheduled Meds:  Chlorhexidine  Gluconate Cloth  6 each Topical Q0600   metoprolol  tartrate  5 mg Intravenous Q8H   morphine    Intravenous Q4H   pantoprazole  (PROTONIX ) IV  40 mg Intravenous QHS   sodium hypochlorite   Irrigation BID   Continuous Infusions:  dextrose  5 % and 0.45 % NaCl 125 mL/hr at 07/01/24 1807     LOS: 6 days   Ivonne Mustache, MD [facility] P7/06/2024, 9:45 AM

## 2024-07-02 NOTE — Plan of Care (Signed)
  Request seen to evaluate for drainage.  Images reviewed by Dr. Juliene Balder.  Intra abdominal collection is not amendable to drainage due to location and surrounding structures - recommend conservative management at this time.  Subcutaneous collection in left abdomen is likely hematoma - recommend conservative management as well.  Teodor Prater S Dusty Raczkowski PA-C 07/02/2024 8:06 AM

## 2024-07-03 DIAGNOSIS — K56609 Unspecified intestinal obstruction, unspecified as to partial versus complete obstruction: Secondary | ICD-10-CM | POA: Diagnosis not present

## 2024-07-03 LAB — GLUCOSE, CAPILLARY
Glucose-Capillary: 103 mg/dL — ABNORMAL HIGH (ref 70–99)
Glucose-Capillary: 123 mg/dL — ABNORMAL HIGH (ref 70–99)
Glucose-Capillary: 94 mg/dL (ref 70–99)
Glucose-Capillary: 95 mg/dL (ref 70–99)

## 2024-07-03 MED ORDER — ENSURE PLUS HIGH PROTEIN PO LIQD
237.0000 mL | Freq: Two times a day (BID) | ORAL | Status: DC
  Administered 2024-07-03 – 2024-07-04 (×2): 237 mL via ORAL

## 2024-07-03 MED ORDER — THIAMINE MONONITRATE 100 MG PO TABS
100.0000 mg | ORAL_TABLET | Freq: Every day | ORAL | Status: AC
  Administered 2024-07-03 – 2024-07-07 (×5): 100 mg via ORAL
  Filled 2024-07-03 (×5): qty 1

## 2024-07-03 MED ORDER — ADULT MULTIVITAMIN W/MINERALS CH
1.0000 | ORAL_TABLET | Freq: Every day | ORAL | Status: DC
  Administered 2024-07-03 – 2024-07-08 (×6): 1 via ORAL
  Filled 2024-07-03 (×6): qty 1

## 2024-07-03 MED ORDER — BOOST / RESOURCE BREEZE PO LIQD CUSTOM
1.0000 | Freq: Three times a day (TID) | ORAL | Status: DC
  Administered 2024-07-03 – 2024-07-05 (×5): 1 via ORAL

## 2024-07-03 MED ORDER — OXYCODONE HCL 5 MG PO TABS
15.0000 mg | ORAL_TABLET | Freq: Four times a day (QID) | ORAL | Status: DC | PRN
  Administered 2024-07-03 – 2024-07-06 (×9): 15 mg via ORAL
  Filled 2024-07-03 (×10): qty 3

## 2024-07-03 MED ORDER — JUVEN PO PACK
1.0000 | PACK | Freq: Two times a day (BID) | ORAL | Status: DC
  Administered 2024-07-03 – 2024-07-08 (×9): 1 via ORAL
  Filled 2024-07-03 (×10): qty 1

## 2024-07-03 NOTE — Consult Note (Addendum)
 WOC team consulted at patients request saying he has not been seen by [facility] team for his ostomy.  Please see K. Mayo, NP note from Monday 07/02/2024 where she reapplied wound vac and assessed ostomy.  Patient has had colostomy since 2021 and is independent with ostomy care.  He did however have a revision of stoma by Dr. Curvin 06/26/2024.  Stoma has been evaluated  by ostomy team on 2 separate occasions since that revision.    Will ask WOC RN to speak to patient 07/03/2024 for any concerns/questions.  Patient is due to have next NPWT change Thursday 07/05/2024.    Thank you,    Powell Bar MSN, RN-BC, [employer]

## 2024-07-03 NOTE — Plan of Care (Signed)

## 2024-07-03 NOTE — Progress Notes (Addendum)
 7 Days Post-Op   Subjective/Chief Complaint: Continues to complain of abdominal pain, specifically stabbing pains at stoma site.  Reports his abdomen feels warm. States his ostomy leaked into his wound overnight.    Objective: Vital signs in last 24 hours: Temp:  [97.8 F (36.6 C)-98.5 F (36.9 C)] 98.5 F (36.9 C) (07/08 0758) Pulse Rate:  [95-100] 95 (07/08 0758) Resp:  [16-20] 20 (07/08 0758) BP: (136-143)/(88-99) 139/96 (07/08 0758) SpO2:  [100 %] 100 % (07/08 0758) Last BM Date : 07/02/24  Intake/Output from previous day: 07/07 0701 - 07/08 0700 In: 588.5 [P.O.:480; I.V.:58.5; IV Piggyback:50] Out: 1660 [Urine:1650; Drains:10] Intake/Output this shift: Total I/O In: 480 [P.O.:480] Out: 1823 [Urine:1823]  Gen: NAD CV: RRR Pulm: NWOB on room air Abd: Soft, mild tenderness to palpation around ostomy and midline surgical site, drain with minimal output, SS  Lab Results:  Recent Labs    07/01/24 0529 07/02/24 0323  WBC 8.1 9.7  HGB 11.0* 10.7*  HCT 34.3* 34.1*  PLT 237 269   BMET Recent Labs    07/01/24 0529 07/02/24 0323  NA 136 141  K 3.4* 3.7  CL 101 107  CO2 26 25  GLUCOSE 121* 184*  BUN <5* <5*  CREATININE 0.82 0.79  CALCIUM 8.3* 8.3*   PT/INR No results for input(s): LABPROT, INR in the last 72 hours. ABG No results for input(s): PHART, HCO3 in the last 72 hours.  Invalid input(s): PCO2, PO2  Studies/Results: CT ABDOMEN PELVIS WO CONTRAST Result Date: 07/01/2024 CLINICAL DATA:  Postop abdomen pain EXAM: CT ABDOMEN AND PELVIS WITHOUT CONTRAST TECHNIQUE: Multidetector CT imaging of the abdomen and pelvis was performed following the standard protocol without IV contrast. RADIATION DOSE REDUCTION: This exam was performed according to the departmental dose-optimization program which includes automated exposure control, adjustment of the mA and/or kV according to patient size and/or use of iterative reconstruction technique. COMPARISON:  CT  07/01/2024, 06/26/2024, 11/06/2023 FINDINGS: Lower chest: Lung bases demonstrate mild dependent atelectasis. Gynecomastia. Hepatobiliary: Probable vicarious excretion of contrast in the gallbladder. No biliary dilatation. Pancreas: Unremarkable. No pancreatic ductal dilatation or surrounding inflammatory changes. Spleen: Normal in size without focal abnormality. Adrenals/Urinary Tract: Adrenal glands are normal. Kidneys show no hydronephrosis. The bladder is unremarkable Stomach/Bowel: Small volume enteral contrast in the stomach. Enteral contrast within small and large bowel. Fluid distension of small bowel loops measuring up to 4.8 cm. Left abdominal colostomy, small amount of contrast has reached the colostomy bag. Rectal pouch containing small amount of high density material, unchanged. Postsurgical changes of the small bowel with staple line in the left pelvis. Mild areas of small-bowel wall thickening. Redemonstrated triangular shaped fluid collection in the central pelvis, measures approximately 6.8 x 6 cm on series 3, image 73. Small amount of hyperdense material in the collection dependently, series 3, image 74. Overall density value not consistent with simple fluid. Density value measures slightly higher than on the CT performed earlier today when there was no enteral contrast present. Small gas bubbles within the fluid collection and adjacent to the suture line. Persistent gas and soft tissue stranding adjacent to the left abdominal colostomy, likely postoperative. Drainage catheter with tip in the subcutaneous soft tissues. Slightly dense collection in the subcutaneous soft tissues of the left abdominal wall, partially surrounds the drainage catheter, this collection measures 7.9 x 3.9 cm and is suspect for a hematoma. Vascular/Lymphatic: Aortic atherosclerosis. No enlarged abdominal or pelvic lymph nodes. Reproductive: Hysterectomy.  No suspicious adnexal mass Other: Small volume  free fluid in the  pelvis. Generalized subcutaneous edema. Soft tissue stranding within the mesentery, could be due to resolving postoperative change. Musculoskeletal: No acute or suspicious osseous abnormality IMPRESSION: 1. Postop changes corresponding to history of repair of small bowel resection, left abdominal parastomal hernia and revision of distal colostomy. Interval administration of enteral contrast. Some dilated loops of small bowel but contrast has reached the ostomy bag and findings favor postoperative ileus. 2. 6.8 cm triangular shaped fluid collection in the central pelvis, small amount of hyperdense material in the collection dependently which was present on the CT performed earlier today. Small gas bubbles within the fluid collection and adjacent to the small bowel suture line. Fluid collection in the pelvis measures slightly more dense compared to the examination performed earlier today without contrast. Differential considerations include postoperative fluid collection or possible hematoma with small foci of postoperative gas in the collection and adjacent to the anastomosis. Given slight change in density value of the fluid collection compared to the exam earlier today, postoperative leak at the small bowel suture line in the pelvis is also a consideration. 3. 7.9 cm slightly dense collection in the subcutaneous soft tissues of the left abdominal wall, partially surrounds the drainage catheter, suspect for a hematoma. 4. Small volume free fluid in the pelvis. Generalized subcutaneous edema. 5. Aortic atherosclerosis. Aortic Atherosclerosis (ICD10-I70.0). Electronically Signed   By: Luke Bun M.D.   On: 07/01/2024 15:51   US  EKG SITE RITE Result Date: 07/01/2024 If Site Rite image not attached, placement could not be confirmed due to current cardiac rhythm.   Anti-infectives: Anti-infectives (From admission, onward)    Start     Dose/Rate Route Frequency Ordered Stop   07/02/24 1000   piperacillin -tazobactam (ZOSYN ) IVPB 3.375 g        3.375 g 12.5 mL/hr over 240 Minutes Intravenous Every 8 hours 07/02/24 0952     06/27/24 0200  piperacillin -tazobactam (ZOSYN ) IVPB 3.375 g        3.375 g 12.5 mL/hr over 240 Minutes Intravenous Every 8 hours 06/27/24 0113 07/02/24 0715   06/26/24 2130  piperacillin -tazobactam (ZOSYN ) IVPB 3.375 g  Status:  Discontinued        3.375 g 12.5 mL/hr over 240 Minutes Intravenous Every 8 hours 06/26/24 2117 06/27/24 0113   06/26/24 0830  cefoTEtan  (CEFOTAN ) 2 g in sodium chloride  0.9 % 100 mL IVPB        2 g 200 mL/hr over 30 Minutes Intravenous On call to O.R. 06/26/24 0820 06/26/24 1335       Assessment/Plan: s/p Procedure(s) with comments: LAPAROTOMY, EXPLORATORY (N/A) - Lysis of adhesions, small bowel resection, repair of paristomal hernia, revision of decending colostomy on 06/26/24 with Dr. Curvin POD#7, afebrile, WBC 9.7 yesterday UA from 7/6 negative Continue wound care CT 7/6 w/ PO contrast shows contrast to the level of the colostomy. Pelvic fluid collection with hyperdense material and small amt gas. Differential: abscess, hematoma, or, less likely, small anastomotic leak from small bowel anastomosis. IR consulted and unable to drain. Continue IV abx  Ostomy functioning well but is leaking today so will ask WOC to evaluate.  continue IV abx for pelvic fluid collection, abdominal wall collection likely hematoma, ok for FLD.    LOS: 7 days    Almarie GORMAN Pringle 07/03/2024

## 2024-07-03 NOTE — Progress Notes (Signed)
 Initial Nutrition Assessment  DOCUMENTATION CODES:   Obesity unspecified  INTERVENTION:   Diet advancement per surgery- currently FLD Add Boost Breeze po TID, each supplement provides 250 kcal and 9 grams of protein Trial Ensure Plus High Protein po BID, each supplement provides 350 kcal and 20 grams of protein Add 1 packet Juven BID, each packet provides 95 calories, 2.5 grams of protein (collagen) to support wound healing Add MVI with minerals daily Add 100 mg Thiamine  x 5 days Monitor magnesium , potassium, and phosphorus daily for at least 3 days, MD to replete as needed, as pt is at risk for refeeding syndrome  If pt does not tolerate FLD, recommend initiating TPN  NUTRITION DIAGNOSIS:   Inadequate oral intake related to altered GI function as evidenced by meal completion < 50%, energy intake < or equal to 50% for > or equal to 5 days.   GOAL:   Patient will meet greater than or equal to 90% of their needs   MONITOR:   PO intake, Supplement acceptance, Diet advancement, I & O's, Labs  REASON FOR ASSESSMENT:   NPO/Clear Liquid Diet    ASSESSMENT:  43 y.o. year old male with PMH of diverticulitis with complicated incarcerated hernia status post colostomy in 2021, HTN, prediabetes, GERD, and obstructive sleep apnea not on home CPAP. Presented with left sided abdominal pain with associated nausea and vomiting. Imaging showed SBO now s/p ex lap with LOA, SBR, repair of parastomal hernia, and revision of descending colostomy 7/1.  7/1 - s/p ex lap with LOA, SBR, repair of parastomal hernia, and revision of descending colostomy 7/5 - CLD 7/6 - CT abd/pelvis showed accumulation of fluid near anastomosis. Not a candidate for IR drainage. NPO 7/7 - CLD   Pt in noticeable pain on visit, endorses pain at stoma site and at wound incision. No family bedside.  Pt has had colostomy since 2021 and since then pt reports his eating has never fully been the same. He gets nauseated in  the morning and typically cannot eat breakfast until 2-3 hours after waking up. He has to eat very slowly or he starts getting nauseas with occasional emesis. He reports it can take him an hour or more to finish a meal. Typically eats 2-3 meals per day and states he does not have any snacks throughout the day. Does drink at least 1 Ensure Clear or protein shake his son has at home per day. Within the last month pt has noticed a decrease in his appetite;  even so, pt does not endorse any weight loss, UBW is around 275 lbs. On exam pt with no fat or muscle loss. Endorses N/V 1 day prior to admission when left abdominal pain started.   Pt is now POD 7 from SBR, and has been 7 days NPO/CLD. Had sips of jello, cranberry juice, Gatorade, and bites of an Icee for breakfast with no N/V. Does complain of abdominal pain at stoma site. Did have soda that made him queasy. RD does not recommend carbonated drinks at this time, pt verbalized understanding.  Surgery PA advanced to FLD today. If pt does not tolerate FLD may require TPN, discussed with surgery PA.   Pt stated he is lactose intolerant sometimes, depending on how he feels for the day. Is worried about trying Ensures as he did not tolerate them before but is amenable to [employer]. Provided Boost Breeze to patient. Pt does endorse feeling extremely hungry today. States his ostomy leaked into his wound overnight  and abdomen warm today.  Per op note 2 feet (60 cm) of small bowel taken out.  Dietary recall: Breakfast - Does not typically eat  Lunch: sandwich Dinner: Whatever he cooks for son, meat, starch, vegetable Drinks: Gatorade, protein shake  Admit weight: 126.1 kg Current weight: 126.1 kg    Average Meal Intake: No meals recorded  Colostomy: no output charted x 24 hours, colostomy is yielding stool    Intake/Output Summary (Last 24 hours) at 07/03/2024 1340 Last data filed at 07/03/2024 1130 Gross per 24 hour  Intake 1068.49 ml  Output  2633 ml  Net -1564.51 ml   Drains/Lines: JP drain 10 ml x 24 hours   Nutritionally Relevant Medications: Scheduled Meds:  Chlorhexidine  Gluconate Cloth  6 each Topical Q0600   feeding supplement  1 Container Oral TID BM   feeding supplement  237 mL Oral BID BM   metoprolol  tartrate  5 mg Intravenous Q8H   multivitamin with minerals  1 tablet Oral Daily   nutrition supplement (JUVEN)  1 packet Oral BID BM   pantoprazole  (PROTONIX ) IV  40 mg Intravenous QHS   thiamine   100 mg Oral Daily   Continuous Infusions:  piperacillin -tazobactam (ZOSYN )  IV 3.375 g (07/03/24 0514)   Labs Reviewed: Calcium 8.3 Albumin  2.3 CBG ranges from 94-123 mg/dL over the last 24 hours HgbA1c 5.6   NUTRITION - FOCUSED PHYSICAL EXAM:  Flowsheet Row Most Recent Value  Orbital Region No depletion  Upper Arm Region No depletion  Thoracic and Lumbar Region No depletion  Buccal Region No depletion  Temple Region No depletion  Clavicle Bone Region No depletion  Clavicle and Acromion Bone Region No depletion  Scapular Bone Region No depletion  Dorsal Hand No depletion  Patellar Region No depletion  Anterior Thigh Region No depletion  Posterior Calf Region No depletion  Edema (RD Assessment) None  Hair Reviewed  Eyes Reviewed  Mouth Reviewed  Skin Reviewed  Nails Reviewed    Diet Order:   Diet Order             Diet full liquid Fluid consistency: Thin  Diet effective now                   EDUCATION NEEDS:   Education needs have been addressed  Skin:  Skin Assessment: Skin Integrity Issues: Skin Integrity Issues:: Incisions Incisions: Surgical incision abdomen  Last BM:  no output charted via colosotmy x 2 4hours but ostomy is yielding stool  Height:   Ht Readings from Last 1 Encounters:  06/26/24 5' 6 (1.676 m)    Weight:   Wt Readings from Last 1 Encounters:  06/26/24 126.1 kg    Ideal Body Weight:  64.5 kg  BMI:  Body mass index is 44.87 kg/m.  Estimated  Nutritional Needs:   Kcal:  2200-2400 kcal  Protein:  130-150 gm  Fluid:  >2L/day   Olivia Kenning, RD Registered Dietitian  See Amion for more information

## 2024-07-03 NOTE — Progress Notes (Signed)
 PROGRESS NOTE  Jordan [name]  FMW:[id number] DOB: Feb 14, 1981 DOA: 06/26/2024 PCP: [name], MD   Brief Narrative: Patient is a 43 year old male with history of diverticulitis complicated by incarcerated hernia status post colostomy in 2021, OSA not on CPAP, hypertension, morbid obesity, prediabetes who presented with left-sided abdominal pain after lifting heavy case of water, with associated  nausea and vomiting.  Imaging showed small bowel obstruction with possible incarceration and strangulation in the setting of large parastomal hernia containing sigmoid colon and small bowel.  Lactic acid was elevated on presentation.  General surgery consulted.  Taken to the OR and had expiratory laparotomy with LOA, small bowel resection, repair of parastomal hernia and revision of descending colostomy by Dr. Curvin on 7/1.  Started on PCA for pain control.  General surgery closely following.  Currently on IV Zosyn .  Assessment & Plan:  Principal Problem:   SBO (small bowel obstruction) (HCC) Active Problems:   Hypertensive urgency   Increased anion gap metabolic acidosis   Prediabetes  Small bowel obstruction: Presented with sudden onset of lower abdominal pain with nausea and vomiting. Imaging showed small bowel obstruction with possible incarceration and strangulation in the setting of large parastomal hernia containing sigmoid colon and small bowel.  Lactic acid was elevated on presentation.  General surgery consulted.  Taken to the OR and had expiratory laparotomy with LOA, small bowel resection, repair of parastomal hernia and revision of descending colostomy by Dr. Curvin on 7/1.  Started on PCA for pain control.  Follow-up CT abdomen/pelvis on 7/6 showed fluid collection near anastomosis, body wall/flank edema.  No fever or leukocytosis.  Currently on IV Zosyn .  IR was consulted to check if he is a candidate for IR drainage but as per IR, intra-abdominal collection is not amenable  to drainage due to location and surrounding structures commended conservative management.  Continue PPI, IV fluid.  Currently on clear liquid diet.  Colostomy is yielding  stool.  Management as per general surgery.  Hypertensive urgency/sinus tachycardia: Resolved.  On IV metoprolol  every 8 hours.  AKI: Resolved with IV fluid  Hyperglycemia:recent  A1c 5.6.  Monitor blood sugars  OSA: Not on CPAP.  On supplemental oxygen nightly.  Leukocytosis: Resolved  Morbid obesity: BMI of 44.8         DVT prophylaxis:Place and maintain sequential compression device Start: 07/01/24 0609 SCD's Start: 06/26/24 2118     Code Status: Full Code  Family Communication: Cousin at bedside on 7/7  Patient status:Inpatient  Patient is from :Home  Anticipated discharge un:Ynfz  Estimated DC date:after clearance from general surgery.   Consultants: Surgery  Procedures: Exploratory laparatomy  Antimicrobials:  Anti-infectives (From admission, onward)    Start     Dose/Rate Route Frequency Ordered Stop   07/02/24 1000  piperacillin -tazobactam (ZOSYN ) IVPB 3.375 g        3.375 g 12.5 mL/hr over 240 Minutes Intravenous Every 8 hours 07/02/24 0952     06/27/24 0200  piperacillin -tazobactam (ZOSYN ) IVPB 3.375 g        3.375 g 12.5 mL/hr over 240 Minutes Intravenous Every 8 hours 06/27/24 0113 07/02/24 0715   06/26/24 2130  piperacillin -tazobactam (ZOSYN ) IVPB 3.375 g  Status:  Discontinued        3.375 g 12.5 mL/hr over 240 Minutes Intravenous Every 8 hours 06/26/24 2117 06/27/24 0113   06/26/24 0830  cefoTEtan  (CEFOTAN ) 2 g in sodium chloride  0.9 % 100 mL IVPB        2  g 200 mL/hr over 30 Minutes Intravenous On call to O.R. 06/26/24 0820 06/26/24 1335       Subjective:  Patient seen and examined at the bedside today.  Overall looks comfortable.  Lying on bed.  Tolerating clear liquid diet.  Continues to complain of some abdominal discomfort.  Abdomen is mostly soft and nontender with  good bowel sounds.  Colostomy yielding stool.  He was also concerned for leakage from the colostomy site.  He was requesting to increase the dose of oxycodone .  Objective: Vitals:   07/02/24 1430 07/02/24 2046 07/03/24 0448 07/03/24 0758  BP: 139/88 136/88 (!) 143/99 (!) 139/96  Pulse: 100 97 97 95  Resp: 17 16 18 20   Temp:  98.4 F (36.9 C) 97.8 F (36.6 C) 98.5 F (36.9 C)  TempSrc:  Oral Oral Oral  SpO2: 100% 100% 100% 100%  Weight:      Height:        Intake/Output Summary (Last 24 hours) at 07/03/2024 1302 Last data filed at 07/03/2024 1130 Gross per 24 hour  Intake 1068.49 ml  Output 2633 ml  Net -1564.51 ml   Filed Weights   06/26/24 0214 06/26/24 0306 06/26/24 1140  Weight: 126.1 kg 126.1 kg 126.1 kg    Examination:  General exam: Overall comfortable, not in distress, morbidly obese HEENT: PERRL Respiratory system:  no wheezes or crackles  Cardiovascular system: S1 & S2 heard, RRR.  Gastrointestinal system: Abdomen is nondistended, soft and mostly nontender.  Bowel sounds present.  Colostomy, midline wound VAC, JP drain with sanguinous fluid Central nervous system: Alert and oriented Extremities: No edema, no clubbing ,no cyanosis Skin: No rashes, no ulcers,no icterus       Data Reviewed: I have personally reviewed following labs and imaging studies  CBC: Recent Labs  Lab 06/28/24 0635 06/29/24 0622 06/30/24 0854 07/01/24 0529 07/02/24 0323  WBC 15.3* 8.9 8.9 8.1 9.7  NEUTROABS 12.8*  --   --   --   --   HGB 13.3 13.0 12.9* 11.0* 10.7*  HCT 44.4 42.6 42.1 34.3* 34.1*  MCV 91.4 88.8 88.1 85.5 87.9  PLT 225 209 270 237 269   Basic Metabolic Panel: Recent Labs  Lab 06/28/24 0635 06/29/24 0622 06/30/24 0854 07/01/24 0529 07/02/24 0323  NA 141 145 146* 136 141  K 4.7 4.2 3.8 3.4* 3.7  CL 105 106 106 101 107  CO2 24 28 28 26 25   GLUCOSE 119* 111* 99 121* 184*  BUN 7 8 8  <5* <5*  CREATININE 8.67* 0.89 1.16 0.82 0.79  CALCIUM 8.6* 8.7* 9.0 8.3*  8.3*  MG 2.1 2.1 2.3 1.8 2.1  PHOS 3.0 2.8 2.9 2.9 3.4     No results found for this or any previous visit (from the past 240 hours).   Radiology Studies: CT ABDOMEN PELVIS WO CONTRAST Result Date: 07/01/2024 CLINICAL DATA:  Postop abdomen pain EXAM: CT ABDOMEN AND PELVIS WITHOUT CONTRAST TECHNIQUE: Multidetector CT imaging of the abdomen and pelvis was performed following the standard protocol without IV contrast. RADIATION DOSE REDUCTION: This exam was performed according to the departmental dose-optimization program which includes automated exposure control, adjustment of the mA and/or kV according to patient size and/or use of iterative reconstruction technique. COMPARISON:  CT 07/01/2024, 06/26/2024, 11/06/2023 FINDINGS: Lower chest: Lung bases demonstrate mild dependent atelectasis. Gynecomastia. Hepatobiliary: Probable vicarious excretion of contrast in the gallbladder. No biliary dilatation. Pancreas: Unremarkable. No pancreatic ductal dilatation or surrounding inflammatory changes. Spleen: Normal in size without focal  abnormality. Adrenals/Urinary Tract: Adrenal glands are normal. Kidneys show no hydronephrosis. The bladder is unremarkable Stomach/Bowel: Small volume enteral contrast in the stomach. Enteral contrast within small and large bowel. Fluid distension of small bowel loops measuring up to 4.8 cm. Left abdominal colostomy, small amount of contrast has reached the colostomy bag. Rectal pouch containing small amount of high density material, unchanged. Postsurgical changes of the small bowel with staple line in the left pelvis. Mild areas of small-bowel wall thickening. Redemonstrated triangular shaped fluid collection in the central pelvis, measures approximately 6.8 x 6 cm on series 3, image 73. Small amount of hyperdense material in the collection dependently, series 3, image 74. Overall density value not consistent with simple fluid. Density value measures slightly higher than on the CT  performed earlier today when there was no enteral contrast present. Small gas bubbles within the fluid collection and adjacent to the suture line. Persistent gas and soft tissue stranding adjacent to the left abdominal colostomy, likely postoperative. Drainage catheter with tip in the subcutaneous soft tissues. Slightly dense collection in the subcutaneous soft tissues of the left abdominal wall, partially surrounds the drainage catheter, this collection measures 7.9 x 3.9 cm and is suspect for a hematoma. Vascular/Lymphatic: Aortic atherosclerosis. No enlarged abdominal or pelvic lymph nodes. Reproductive: Hysterectomy.  No suspicious adnexal mass Other: Small volume free fluid in the pelvis. Generalized subcutaneous edema. Soft tissue stranding within the mesentery, could be due to resolving postoperative change. Musculoskeletal: No acute or suspicious osseous abnormality IMPRESSION: 1. Postop changes corresponding to history of repair of small bowel resection, left abdominal parastomal hernia and revision of distal colostomy. Interval administration of enteral contrast. Some dilated loops of small bowel but contrast has reached the ostomy bag and findings favor postoperative ileus. 2. 6.8 cm triangular shaped fluid collection in the central pelvis, small amount of hyperdense material in the collection dependently which was present on the CT performed earlier today. Small gas bubbles within the fluid collection and adjacent to the small bowel suture line. Fluid collection in the pelvis measures slightly more dense compared to the examination performed earlier today without contrast. Differential considerations include postoperative fluid collection or possible hematoma with small foci of postoperative gas in the collection and adjacent to the anastomosis. Given slight change in density value of the fluid collection compared to the exam earlier today, postoperative leak at the small bowel suture line in the pelvis  is also a consideration. 3. 7.9 cm slightly dense collection in the subcutaneous soft tissues of the left abdominal wall, partially surrounds the drainage catheter, suspect for a hematoma. 4. Small volume free fluid in the pelvis. Generalized subcutaneous edema. 5. Aortic atherosclerosis. Aortic Atherosclerosis (ICD10-I70.0). Electronically Signed   By: Luke Bun M.D.   On: 07/01/2024 15:51   US  EKG SITE RITE Result Date: 07/01/2024 If Site Rite image not attached, placement could not be confirmed due to current cardiac rhythm.   Scheduled Meds:  Chlorhexidine  Gluconate Cloth  6 each Topical Q0600   metoprolol  tartrate  5 mg Intravenous Q8H   pantoprazole  (PROTONIX ) IV  40 mg Intravenous QHS   Continuous Infusions:  piperacillin -tazobactam (ZOSYN )  IV 3.375 g (07/03/24 0514)     LOS: 7 days   Ivonne Mustache, MD [facility] P7/07/2024, 1:02 PM

## 2024-07-03 NOTE — Consult Note (Signed)
 WOC Nurse wound follow up Asked to reassess the pt due a leaking. Wound type: Surgical Measurement: 21 x 10 x 5 cm. Wound bed: 80% beefy red, 20% yellow, fat tissue. Drainage (amount, consistency, odor) Moderate, 100 ml on the canister at 1300 Periwound: intact. Dressing procedure/placement/frequency: Removed old NPWT dressing Cleansed wound with normal saline Filled wound with 1 piece of black foam  Sealed NPWT dressing at HG/182mmHG  Patient tolerated procedure well.  WOC nurse will continue to provide NPWT dressing changed due to the complexity of the dressing change.  Next change on FRI.  WOC Nurse ostomy consult note Stoma type/location: LMQ colostomy revision. Stomal assessment/size: oval, 44 mm x 35 mm. Peristomal assessment: periostomy wound 05. X 0.4 cm at 10 o'clock position (pyoderma?) Treatment options for stomal/peristomal skin: Powder #6, ring [id number]. Output less than 40 ml. Ostomy pouching: 2pc. #[id number], bag #649. Education provided:  Pt already had an ostomy before, he can management his ostomy. Reinforce to use a convex pouch, ostomy ring and powder on his periostomy wound. Enrolled patient in [employer] DC program: Therapist, nutritional.  WOC team will follow FRI.  Please reconsult if further assistance is needed. Thank-you,  Lela Holm BSN, RN, [employer], WOC  (Pager: [phone])

## 2024-07-03 NOTE — Progress Notes (Signed)
 PT Cancellation Note  Patient Details Name: Jordan [name] MRN: [id number] DOB: [birth year]   Cancelled Treatment:    Reason Eval/Treat Not Completed: (P) Pain limiting ability to participate, pt politely declining all mobility due to pain during x2 attempts this date, despite pre-medication on both attempts. Pt reporting he has however been up in room and was able to ambulate to bathroom and was up in chair on 2nd attempt. Continued education on importance of continued mobility for strength maintenance with pt verbalizing understanding. Will follow up next date.   Jordan [name]. PTA Acute Rehabilitation Services Office: [phone]   Jordan [name] 07/03/2024, 3:04 PM

## 2024-07-03 NOTE — Progress Notes (Signed)
 pt requesting that oxy be 15mg . Adhikari,MD made aware

## 2024-07-04 DIAGNOSIS — K56609 Unspecified intestinal obstruction, unspecified as to partial versus complete obstruction: Secondary | ICD-10-CM | POA: Diagnosis not present

## 2024-07-04 LAB — GLUCOSE, CAPILLARY
Glucose-Capillary: 100 mg/dL — ABNORMAL HIGH (ref 70–99)
Glucose-Capillary: 111 mg/dL — ABNORMAL HIGH (ref 70–99)
Glucose-Capillary: 113 mg/dL — ABNORMAL HIGH (ref 70–99)
Glucose-Capillary: 123 mg/dL — ABNORMAL HIGH (ref 70–99)
Glucose-Capillary: 125 mg/dL — ABNORMAL HIGH (ref 70–99)
Glucose-Capillary: 162 mg/dL — ABNORMAL HIGH (ref 70–99)
Glucose-Capillary: 98 mg/dL (ref 70–99)

## 2024-07-04 LAB — MAGNESIUM: Magnesium: 1.8 mg/dL (ref 1.7–2.4)

## 2024-07-04 LAB — PHOSPHORUS: Phosphorus: 3.7 mg/dL (ref 2.5–4.6)

## 2024-07-04 LAB — POTASSIUM: Potassium: 3.4 mmol/L — ABNORMAL LOW (ref 3.5–5.1)

## 2024-07-04 MED ORDER — METHOCARBAMOL 500 MG PO TABS
1000.0000 mg | ORAL_TABLET | Freq: Four times a day (QID) | ORAL | Status: DC
  Administered 2024-07-04 – 2024-07-08 (×18): 1000 mg via ORAL
  Filled 2024-07-04 (×18): qty 2

## 2024-07-04 MED ORDER — ACETAMINOPHEN 500 MG PO TABS
1000.0000 mg | ORAL_TABLET | Freq: Four times a day (QID) | ORAL | Status: DC
  Administered 2024-07-04 – 2024-07-08 (×15): 1000 mg via ORAL
  Filled 2024-07-04 (×16): qty 2

## 2024-07-04 MED ORDER — KETOROLAC TROMETHAMINE 15 MG/ML IJ SOLN
15.0000 mg | Freq: Four times a day (QID) | INTRAMUSCULAR | Status: AC
  Administered 2024-07-04 – 2024-07-07 (×11): 15 mg via INTRAVENOUS
  Filled 2024-07-04 (×12): qty 1

## 2024-07-04 MED ORDER — POTASSIUM CHLORIDE CRYS ER 20 MEQ PO TBCR
40.0000 meq | EXTENDED_RELEASE_TABLET | Freq: Once | ORAL | Status: AC
  Administered 2024-07-04: 40 meq via ORAL
  Filled 2024-07-04: qty 2

## 2024-07-04 NOTE — TOC Initial Note (Addendum)
 Transition of Care (TOC) - Initial/Assessment Note   Patient will need home negative wound pressure system for home and HHRN to change dressing every Monday and Thursday.   Patient's insurance is not in network with [employer].    Negative wound pressure system form completed and emailed to [facility] with [employer] [facility].   Explained above to patient at bedside. Patient voiced understanding.   Patient has had home health in the past.   Provided patient with medicare.gov list of home health agencies. He will review with wife today when she visits . NCM will follow up with patient for his decision on agency . Patient understands NCM will have to confirm the agency is in network with his insurance and has staffing.   Once home health is secured. Mitch with [employer] will need to be updated.   On day of discharge, bedside nurse will disconnect patient from hospital negative wound pressure system and contact home pump.    1620 Followed up with patient and wife at bedside regarding home health agency . [name] will look at medicare.gov list this evening. [name] asked NCM to call her tomorrow for decision. NCM confirmed [name]'s number. Will follow up tomorrow  Patient Details  Name: Jordan [name] MRN: [id number] Date of Birth: 1981/12/03  Transition of Care [facility]) CM/SW Contact:    Stephane Powell Jansky, RN Phone Number: 07/04/2024, 10:04 AM  Clinical Narrative:                   Expected Discharge Plan: Home w Home Health Services Barriers to Discharge: Continued Medical Work up   Patient Goals and CMS Choice Patient states their goals for this hospitalization and ongoing recovery are:: to return home CMS Medicare.gov Compare Post Acute Care list provided to:: Patient Choice offered to / list presented to : Patient      Expected Discharge Plan and Services   Discharge Planning Services: CM Consult Post Acute Care Choice: Home Health, Durable Medical  Equipment Living arrangements for the past 2 months: Single Family Home                 DME Arranged: Vac DME Agency: [employer] Date DME Agency Contacted: 07/04/24 Time DME Agency Contacted: 1003 Representative spoke with at DME Agency: Mitch HH Arranged:  (see note) HH Agency: NA        Prior Living Arrangements/Services Living arrangements for the past 2 months: Single Family Home Lives with:: Spouse Patient language and need for interpreter reviewed:: Yes Do you feel safe going back to the place where you live?: Yes      Need for Family Participation in Patient Care: Yes (Comment) Care giver support system in place?: Yes (comment) Current home services: DME Criminal Activity/Legal Involvement Pertinent to Current Situation/Hospitalization: No - Comment as needed  Activities of Daily Living   ADL Screening (condition at time of admission) Independently performs ADLs?: Yes (appropriate for developmental age) Is the patient deaf or have difficulty hearing?: No Does the patient have difficulty seeing, even when wearing glasses/contacts?: No Does the patient have difficulty concentrating, remembering, or making decisions?: No  Permission Sought/Granted   Permission granted to share information with : Yes, Verbal Permission Granted  Share Information with NAME: wife Eleanor  Permission granted to share info w AGENCY: [employer] , 83m, home health agencies        Emotional Assessment Appearance:: Appears stated age Attitude/Demeanor/Rapport: Engaged Affect (typically observed): Appropriate Orientation: : Oriented to Self, Oriented to Place, Oriented to  Time, Oriented to Situation Alcohol / Substance Use: Not Applicable Psych Involvement: No (comment)  Admission diagnosis:  SBO (small bowel obstruction) (HCC) [K56.609] Peristomal hernia [K46.9] Patient Active Problem List   Diagnosis Date Noted   Hypertensive urgency 06/26/2024   Increased anion gap metabolic acidosis  06/26/2024   Prediabetes 06/26/2024   Rotator cuff arthropathy of right shoulder 02/07/2024   Small bowel obstruction (HCC) 11/06/2023   SBO (small bowel obstruction) (HCC) 11/06/2023   Colostomy present (HCC) 11/28/2020   Status post colostomy (HCC) 11/04/2020   History of diverticulitis of colon 09/09/2020   Reactive airway disease 10/01/2016   Class 3 severe obesity with serious comorbidity and body mass index (BMI) of 45.0 to 49.9 in adult 02/06/2015   OSA (obstructive sleep apnea) 10/24/2014   Anxiety and depression 01/21/2010   CHRONIC PAIN SYNDROME 01/21/2010   Essential hypertension 01/21/2010   Osteoarthritis 01/21/2010   PCP:  [name], MD Pharmacy:   [facility] - [place], [place] - [address] [place] [address] Phone: [phone] Fax: [phone]  [id number] - [place], [place] - [address] [place] [address] Phone: [phone] Fax: [phone]     Social Drivers of Health (SDOH) Social History: SDOH Screenings   Food Insecurity: Patient Declined (06/27/2024)  Housing: Patient Declined (06/27/2024)  Transportation Needs: Patient Declined (06/27/2024)  Utilities: Patient Declined (06/27/2024)  Alcohol Screen: Low Risk  (05/31/2024)  Depression (PHQ2-9): Medium Risk (05/31/2024)  Financial Resource Strain: Low Risk  (05/31/2024)  Physical Activity: Insufficiently Active (05/31/2024)  Social Connections: Moderately Isolated (05/31/2024)  Stress: Stress Concern Present (05/31/2024)  Tobacco Use: Medium Risk (06/26/2024)  Health Literacy: Adequate Health Literacy (05/31/2024)   SDOH Interventions:     Readmission Risk Interventions     No data to display

## 2024-07-04 NOTE — Plan of Care (Signed)

## 2024-07-04 NOTE — Progress Notes (Signed)
 8 Days Post-Op   Subjective/Chief Complaint: Continues to complain of abdominal pain, specifically stabbing pains at stoma site as well as RLQ, along with left flank firmness. No issues with ostomy leaking overnight.   Objective: Vital signs in last 24 hours: Temp:  [97.8 F (36.6 C)-98.4 F (36.9 C)] 98.3 F (36.8 C) (07/09 0857) Pulse Rate:  [85-105] 85 (07/09 0857) Resp:  [17-20] 17 (07/09 0857) BP: (132-146)/(78-98) 132/89 (07/09 0857) SpO2:  [97 %-100 %] 99 % (07/09 0857) Last BM Date : 07/02/24  Intake/Output from previous day: 07/08 0701 - 07/09 0700 In: 1080 [P.O.:1080] Out: 4343 [Urine:4223; Drains:120] Intake/Output this shift: No intake/output data recorded.  Gen: NAD CV: RRR Pulm: NWOB on room air Abd: Soft, mild tenderness to palpation around ostomy and midline surgical site, drain with minimal output, SS  Stoma viable with dark brown liquid stool in pouch  Lab Results:  Recent Labs    07/02/24 0323  WBC 9.7  HGB 10.7*  HCT 34.1*  PLT 269   BMET Recent Labs    07/02/24 0323 07/04/24 0247  NA 141  --   K 3.7 3.4*  CL 107  --   CO2 25  --   GLUCOSE 184*  --   BUN <5*  --   CREATININE 0.79  --   CALCIUM 8.3*  --    PT/INR No results for input(Jordan): LABPROT, INR in the last 72 hours. ABG No results for input(Jordan): PHART, HCO3 in the last 72 hours.  Invalid input(Jordan): PCO2, PO2  Studies/Results: No results found.   Anti-infectives: Anti-infectives (From admission, onward)    Start     Dose/Rate Route Frequency Ordered Stop   07/02/24 1000  piperacillin -tazobactam (ZOSYN ) IVPB 3.375 g        3.375 g 12.5 mL/hr over 240 Minutes Intravenous Every 8 hours 07/02/24 0952     06/27/24 0200  piperacillin -tazobactam (ZOSYN ) IVPB 3.375 g        3.375 g 12.5 mL/hr over 240 Minutes Intravenous Every 8 hours 06/27/24 0113 07/02/24 0715   06/26/24 2130  piperacillin -tazobactam (ZOSYN ) IVPB 3.375 g  Status:  Discontinued        3.375  g 12.5 mL/hr over 240 Minutes Intravenous Every 8 hours 06/26/24 2117 06/27/24 0113   06/26/24 0830  cefoTEtan  (CEFOTAN ) 2 g in sodium chloride  0.9 % 100 mL IVPB        2 g 200 mL/hr over 30 Minutes Intravenous On call to O.R. 06/26/24 0820 06/26/24 1335       Assessment/Plan: Jordan/p Procedure(Jordan) with comments: LAPAROTOMY, EXPLORATORY (N/A) - Lysis of adhesions, small bowel resection, repair of paristomal hernia, revision of decending colostomy on 06/26/24 with Dr. Curvin POD#8, afebrile, WBC 9.7 on 7/7 - recheck in AM -  CT 7/6 w/ PO contrast shows contrast to the level of the colostomy. Pelvic fluid collection with hyperdense material and small amt gas. Differential: abscess, hematoma, or, less likely, small anastomotic leak from small bowel anastomosis. IR consulted and unable to drain. Continue IV abx  - ileus resolving, continue fulls for today - VAC changes M/Th - schedule tylenol , toradol , and robaxin  today, encourage PO oxy, start to decrease dilaudid  tomorrow   FEN: FLD, ensure; consider diet advancement tomorrow  ID: Zosyn  for pelvic fluid collection; UA from 7/6 negative Foley: none VTE: SCD'Jordan, lovenox   Dispo: Med-surg HH VAC ordered PT eval ordered - pt needs encouragement to get OOB and mobilize    LOS: 8 days    Jordan [name]  Jordan [name] 07/04/2024

## 2024-07-04 NOTE — Progress Notes (Signed)
 PT Cancellation Note  Patient Details Name: Jordan [name] MRN: [id number] DOB: December 27, 1981   Cancelled Treatment:    Reason Eval/Treat Not Completed: (P) Other (comment), pt declining PT session. In bathroom for wash-up with family. Pt did state he ambulated entirety of unit earlier in day. Will check back as schedule allows to continue with PT POC.  Therisa SAUNDERS. PTA Acute Rehabilitation Services Office: [phone]    Therisa CHRISTELLA Boor 07/04/2024, 3:54 PM

## 2024-07-04 NOTE — Progress Notes (Signed)
 Occupational Therapy Treatment Patient Details Name: Jordan [name] MRN: [id number] DOB: 08-23-1981 Today's Date: 07/04/2024   History of present illness Pt is a 43 y/o male presenting on 7/1 with L sided abdominal pain and emesis. Admitted with SBO with possible incarceration and strangulation in setting of parastomal hernia. 7/1 s/p exlap, small bowel resection, repair of hernia and revision of descending colostomy.  PMH includes: asthma, HTN, OA, recurrent UTI, sleep apnea, R TKR.   OT comments  Patient received in supine and declining OOB but willing to participate with OT seated on EOB. Patient able to get to EOB without assistance and increased time due to pain. Patient performed 2 stands from EOB with supervision and limited tolerance due to pain. Patient was provided education on adaptive equipment use for LB dressing with reacher and socks aide. Patient states he believes he can dress without reacher but will consider a wide sock aide. Patient asked to remain on EOB to complete breakfast. Acute OT to continue to follow to address established goals.       If plan is discharge home, recommend the following:  A little help with walking and/or transfers;A lot of help with bathing/dressing/bathroom;Assistance with cooking/housework;Assist for transportation;Help with stairs or ramp for entrance   Equipment Recommendations  None recommended by OT    Recommendations for Other Services      Precautions / Restrictions Precautions Precautions: Fall;Other (comment) Recall of Precautions/Restrictions: Impaired Precaution/Restrictions Comments: abdominal precautions; L abd drain, PCA pump Restrictions Weight Bearing Restrictions Per Provider Order: No       Mobility Bed Mobility Overal bed mobility: Needs Assistance Bed Mobility: Supine to Sit     Supine to sit: Supervision     General bed mobility comments: increased time due to pain    Transfers Overall  transfer level: Needs assistance Equipment used: None Transfers: Sit to/from Stand Sit to Stand: Supervision           General transfer comment: performed standing x2 from EOB with patient being limited due to pain     Balance Overall balance assessment: Mild deficits observed, not formally tested                                         ADL either performed or assessed with clinical judgement   ADL Overall ADL's : Needs assistance/impaired Eating/Feeding: Modified independent;Sitting   Grooming: Set up;Sitting               Lower Body Dressing: Sit to/from stand;Moderate assistance Lower Body Dressing Details (indicate cue type and reason): education on AE for LB dressing               General ADL Comments: education for LB dressing    Extremity/Trunk Assessment              Vision       Perception     Praxis     Communication Communication Communication: No apparent difficulties   Cognition Arousal: Alert Behavior During Therapy: Impulsive Cognition: Cognition impaired           Executive functioning impairment (select all impairments): Reasoning OT - Cognition Comments: frequent cues for safety                 Following commands: Impaired Following commands impaired: Follows multi-step commands inconsistently      Cueing   Cueing Techniques: Verbal  cues  Exercises      Shoulder Instructions       General Comments SpO2 98% on RA    Pertinent Vitals/ Pain       Pain Assessment Pain Assessment: 0-10 Pain Score: 8  Pain Location: abdomen Pain Descriptors / Indicators: Discomfort, Operative site guarding, Moaning, Sharp Pain Intervention(s): Limited activity within patient's tolerance, Monitored during session, Premedicated before session, Ice applied  Home Living                                          Prior Functioning/Environment              Frequency  Min 2X/week         Progress Toward Goals  OT Goals(current goals can now be found in the care plan section)  Progress towards OT goals: Progressing toward goals  Acute Rehab OT Goals Patient Stated Goal: less pain and go home OT Goal Formulation: With patient Time For Goal Achievement: 07/15/24 Potential to Achieve Goals: Good ADL Goals Pt Will Perform Grooming: with modified independence;standing;sitting Pt Will Perform Lower Body Dressing: with modified independence;sit to/from stand;sitting/lateral leans;with adaptive equipment Pt Will Transfer to Toilet: with modified independence;ambulating Pt Will Perform Toileting - Clothing Manipulation and hygiene: with modified independence;sit to/from stand;sitting/lateral leans Additional ADL Goal #1: Pt will maintain SpO2 >90% using PLB without cueing during ADls.  Plan      Co-evaluation                 AM-PAC OT 6 Clicks Daily Activity     Outcome Measure   Help from another person eating meals?: None Help from another person taking care of personal grooming?: A Little Help from another person toileting, which includes using toliet, bedpan, or urinal?: A Little Help from another person bathing (including washing, rinsing, drying)?: A Lot Help from another person to put on and taking off regular upper body clothing?: A Little Help from another person to put on and taking off regular lower body clothing?: A Lot 6 Click Score: 17    End of Session    OT Visit Diagnosis: Other abnormalities of gait and mobility (R26.89);Muscle weakness (generalized) (M62.81);Pain Pain - part of body:  (abdomin)   Activity Tolerance Patient limited by pain   Patient Left in bed;with call bell/phone within reach;Other (comment) (seated on EOB)   Nurse Communication Mobility status        Time: 1020-1057 OT Time Calculation (min): 37 min  Charges: OT General Charges $OT Visit: 1 Visit OT Treatments $Self Care/Home Management : 8-22  mins $Therapeutic Activity: 8-22 mins  Dick Laine, OTA Acute Rehabilitation Services  Office [phone]   [name] 07/04/2024, 2:03 PM

## 2024-07-04 NOTE — Progress Notes (Signed)
 PROGRESS NOTE  Jordan [name]  FMW:[id number] DOB: 1981-08-05 DOA: 06/26/2024 PCP: [name], MD   Brief Narrative: Patient is a 43 year old male with history of diverticulitis complicated by incarcerated hernia status post colostomy in 2021, OSA not on CPAP, hypertension, morbid obesity, prediabetes who presented with left-sided abdominal pain after lifting heavy case of water, with associated  nausea and vomiting.  Imaging showed small bowel obstruction with possible incarceration and strangulation in the setting of large parastomal hernia containing sigmoid colon and small bowel.  Lactic acid was elevated on presentation.  General surgery consulted.  Taken to the OR and had expiratory laparotomy with LOA, small bowel resection, repair of parastomal hernia and revision of descending colostomy by Dr. Curvin on 7/1.  Started on PCA for pain control.  General surgery closely following.  Currently on IV Zosyn .  Overall clinically improving.  Assessment & Plan:  Principal Problem:   SBO (small bowel obstruction) (HCC) Active Problems:   Hypertensive urgency   Increased anion gap metabolic acidosis   Prediabetes  Small bowel obstruction: Presented with sudden onset of lower abdominal pain with nausea and vomiting. Imaging showed small bowel obstruction with possible incarceration and strangulation in the setting of large parastomal hernia containing sigmoid colon and small bowel.  Lactic acid was elevated on presentation.  General surgery consulted.  Taken to the OR and had expiratory laparotomy with LOA, small bowel resection, repair of parastomal hernia and revision of descending colostomy by Dr. Curvin on 7/1.  Started on PCA for pain control.  Follow-up CT abdomen/pelvis on 7/6 showed fluid collection near anastomosis, body wall/flank edema.  No fever or leukocytosis.  Currently on IV Zosyn .  IR was consulted to check if he is a candidate for IR drainage but as per IR,  intra-abdominal collection is not amenable to drainage due to location and surrounding structures commended conservative management.  Continue PPI.  Currently on full liquid diet.  Colostomy is yielding  stool.  Management as per general surgery. Clinically improving.  He feels more comfortable today  Hypertensive urgency/sinus tachycardia: Resolved.  On IV metoprolol  every 8 hour,now stopped  AKI: Resolved with IV fluid  Hyperglycemia:recent  A1c 5.6.  Monitor blood sugars  OSA: Not on CPAP.  On supplemental oxygen nightly.  Leukocytosis: Resolved  Morbid obesity: BMI of 44.8    Nutrition Problem: Inadequate oral intake Etiology: altered GI function    DVT prophylaxis:Place and maintain sequential compression device Start: 07/01/24 0609 SCD's Start: 06/26/24 2118     Code Status: Full Code  Family Communication: Cousin at bedside on 7/7  Patient status:Inpatient  Patient is from :Home  Anticipated discharge un:[facility]  Estimated DC date:after clearance from general surgery.   Consultants: Surgery  Procedures: Exploratory laparatomy  Antimicrobials:  Anti-infectives (From admission, onward)    Start     Dose/Rate Route Frequency Ordered Stop   07/02/24 1000  piperacillin -tazobactam (ZOSYN ) IVPB 3.375 g        3.375 g 12.5 mL/hr over 240 Minutes Intravenous Every 8 hours 07/02/24 0952     06/27/24 0200  piperacillin -tazobactam (ZOSYN ) IVPB 3.375 g        3.375 g 12.5 mL/hr over 240 Minutes Intravenous Every 8 hours 06/27/24 0113 07/02/24 0715   06/26/24 2130  piperacillin -tazobactam (ZOSYN ) IVPB 3.375 g  Status:  Discontinued        3.375 g 12.5 mL/hr over 240 Minutes Intravenous Every 8 hours 06/26/24 2117 06/27/24 0113   06/26/24 0830  cefoTEtan  (CEFOTAN )  2 g in sodium chloride  0.9 % 100 mL IVPB        2 g 200 mL/hr over 30 Minutes Intravenous On call to O.R. 06/26/24 0820 06/26/24 1335       Subjective:  Patient seen and examined at bedside today.  He  looks comfortable.  He was busy with his phone .  When asked, he says he feels better today but abdominal discomfort not entirely gone.  Colostomy yielding dark stool  Objective: Vitals:   07/03/24 1616 07/03/24 1946 07/04/24 0407 07/04/24 0857  BP: (!) 146/98 (!) 134/93 (!) 142/78 132/89  Pulse: (!) 105 90 87 85  Resp: 20 20 18 17   Temp: 98.1 F (36.7 C) 97.8 F (36.6 C) 98.4 F (36.9 C) 98.3 F (36.8 C)  TempSrc: Oral Oral Oral Oral  SpO2: 97% 100% 100% 99%  Weight:      Height:        Intake/Output Summary (Last 24 hours) at 07/04/2024 1045 Last data filed at 07/04/2024 0530 Gross per 24 hour  Intake 1080 ml  Output 3343 ml  Net -2263 ml   Filed Weights   06/26/24 0214 06/26/24 0306 06/26/24 1140  Weight: 126.1 kg 126.1 kg 126.1 kg    Examination:  General exam: Overall comfortable, not in distress, morbidly obese HEENT: PERRL Respiratory system:  no wheezes or crackles  Cardiovascular system: S1 & S2 heard, RRR.  Gastrointestinal system: Abdomen is nondistended, soft and mostly nontender.  Bowel sounds present.  Colostomy with stool, midline wound VAC, JP drain Central nervous system: Alert and oriented Extremities: No edema, no clubbing ,no cyanosis Skin: No rashes, no ulcers,no icterus     Data Reviewed: I have personally reviewed following labs and imaging studies  CBC: Recent Labs  Lab 06/28/24 0635 06/29/24 0622 06/30/24 0854 07/01/24 0529 07/02/24 0323  WBC 15.3* 8.9 8.9 8.1 9.7  NEUTROABS 12.8*  --   --   --   --   HGB 13.3 13.0 12.9* 11.0* 10.7*  HCT 44.4 42.6 42.1 34.3* 34.1*  MCV 91.4 88.8 88.1 85.5 87.9  PLT 225 209 270 237 269   Basic Metabolic Panel: Recent Labs  Lab 06/28/24 0635 06/29/24 0622 06/30/24 0854 07/01/24 0529 07/02/24 0323 07/04/24 0247  NA 141 145 146* 136 141  --   K 4.7 4.2 3.8 3.4* 3.7 3.4*  CL 105 106 106 101 107  --   CO2 24 28 28 26 25   --   GLUCOSE 119* 111* 99 121* 184*  --   BUN 7 8 8  <5* <5*  --    CREATININE 1.32* 0.89 1.16 0.82 0.79  --   CALCIUM 8.6* 8.7* 9.0 8.3* 8.3*  --   MG 2.1 2.1 2.3 1.8 2.1 1.8  PHOS 3.0 2.8 2.9 2.9 3.4 3.7     No results found for this or any previous visit (from the past 240 hours).   Radiology Studies: No results found.   Scheduled Meds:  acetaminophen   1,000 mg Oral QID   Chlorhexidine  Gluconate Cloth  6 each Topical Q0600   feeding supplement  1 Container Oral TID BM   feeding supplement  237 mL Oral BID BM   ketorolac   15 mg Intravenous Q6H   methocarbamol   1,000 mg Oral QID   metoprolol  tartrate  5 mg Intravenous Q8H   multivitamin with minerals  1 tablet Oral Daily   nutrition supplement (JUVEN)  1 packet Oral BID BM   pantoprazole  (PROTONIX ) IV  40  mg Intravenous QHS   thiamine   100 mg Oral Daily   Continuous Infusions:  piperacillin -tazobactam (ZOSYN )  IV 3.375 g (07/04/24 0530)     LOS: 8 days   Ivonne Mustache, MD [facility] P7/08/2024, 10:45 AM

## 2024-07-05 DIAGNOSIS — K56609 Unspecified intestinal obstruction, unspecified as to partial versus complete obstruction: Secondary | ICD-10-CM | POA: Diagnosis not present

## 2024-07-05 LAB — BASIC METABOLIC PANEL WITH GFR
Anion gap: 10 (ref 5–15)
BUN: 7 mg/dL (ref 6–20)
CO2: 23 mmol/L (ref 22–32)
Calcium: 8.4 mg/dL — ABNORMAL LOW (ref 8.9–10.3)
Chloride: 102 mmol/L (ref 98–111)
Creatinine, Ser: 0.76 mg/dL (ref 0.61–1.24)
GFR, Estimated: >60 mL/min (ref >60)
Glucose, Bld: 106 mg/dL — ABNORMAL HIGH (ref 70–99)
Potassium: 3.9 mmol/L (ref 3.5–5.1)
Sodium: 135 mmol/L (ref 135–145)

## 2024-07-05 LAB — PHOSPHORUS: Phosphorus: 3.2 mg/dL (ref 2.5–4.6)

## 2024-07-05 LAB — CBC
HCT: 31.2 % — ABNORMAL LOW (ref 39.0–52.0)
Hemoglobin: 9.8 g/dL — ABNORMAL LOW (ref 13.0–17.0)
MCH: 27 pg (ref 26.0–34.0)
MCHC: 31.4 g/dL (ref 30.0–36.0)
MCV: 86 fL (ref 80.0–100.0)
Platelets: 323 K/uL (ref 150–400)
RBC: 3.63 MIL/uL — ABNORMAL LOW (ref 4.22–5.81)
RDW: 13.3 % (ref 11.5–15.5)
WBC: 8.9 K/uL (ref 4.0–10.5)
nRBC: 0 % (ref 0.0–0.2)

## 2024-07-05 LAB — GLUCOSE, CAPILLARY
Glucose-Capillary: 84 mg/dL (ref 70–99)
Glucose-Capillary: 74 mg/dL (ref 70–99)
Glucose-Capillary: 130 mg/dL — ABNORMAL HIGH (ref 70–99)
Glucose-Capillary: 100 mg/dL — ABNORMAL HIGH (ref 70–99)

## 2024-07-05 LAB — MAGNESIUM: Magnesium: 1.8 mg/dL (ref 1.7–2.4)

## 2024-07-05 MED ORDER — PROSOURCE PLUS PO LIQD
30.0000 mL | Freq: Two times a day (BID) | ORAL | Status: DC
  Administered 2024-07-05 – 2024-07-07 (×5): 30 mL via ORAL
  Filled 2024-07-05 (×6): qty 30

## 2024-07-05 MED ORDER — BOOST / RESOURCE BREEZE PO LIQD CUSTOM: 1.0000 | Freq: Every day | ORAL | Status: DC

## 2024-07-05 NOTE — Plan of Care (Signed)
  Problem: Skin Integrity: Goal: Risk for impaired skin integrity will decrease Outcome: Progressing   Problem: Safety: Goal: Ability to remain free from injury will improve Outcome: Progressing   Problem: Pain Managment: Goal: General experience of comfort will improve and/or be controlled Outcome: Progressing   Problem: Elimination: Goal: Will not experience complications related to bowel motility Outcome: Progressing

## 2024-07-05 NOTE — Progress Notes (Signed)
 Physical Therapy Treatment Patient Details Name: Jordan [name] MRN: [id number] DOB: 1981-05-12 Today's Date: 07/05/2024   History of Present Illness Pt is a 43 y/o male presenting on 7/1 with L sided abdominal pain and emesis. Admitted with SBO with possible incarceration and strangulation in setting of parastomal hernia. 7/1 s/p exlap, small bowel resection, repair of hernia and revision of descending colostomy.  PMH includes: asthma, HTN, OA, recurrent UTI, sleep apnea, R TKR.    PT Comments  Pt received up in room. Reports that he ambulated hallway 2x yesterday. Pt ambulated 500' with IV pole, also able to ambulate without UE support with steady gait. Instructed pt to ambulate hallway 4-5x/ day. Will ask mobility team to follow, no further acute PT needs at this time. PT signing off.      If plan is discharge home, recommend the following: A little help with bathing/dressing/bathroom;Assistance with cooking/housework;Assist for transportation;Help with stairs or ramp for entrance   Can travel by private vehicle        Equipment Recommendations  None recommended by PT    Recommendations for Other Services       Precautions / Restrictions Precautions Precautions: Fall;Other (comment) Recall of Precautions/Restrictions: Impaired Precaution/Restrictions Comments: abdominal precautions; L abd drain Restrictions Weight Bearing Restrictions Per Provider Order: No     Mobility  Bed Mobility Overal bed mobility: Modified Independent                  Transfers Overall transfer level: Modified independent Equipment used: None Transfers: Sit to/from Stand Sit to Stand: Modified independent (Device/Increase time)                Ambulation/Gait Ambulation/Gait assistance: Modified independent (Device/Increase time) Gait Distance (Feet): 500 Feet Assistive device: IV Pole, None Gait Pattern/deviations: Decreased stride length Gait velocity:  decreased Gait velocity interpretation: >2.62 ft/sec, indicative of community ambulatory   General Gait Details: decreased stride length to manage pain. Pt able to amnbulate safely with and without IV pole for support. Mild trunk flexion due to pain but pt able to straighten for short bouts   Stairs Stairs:  (educated on sequencing and use of rail and pt to practice independently in therapy gym when pain allows)           Wheelchair Mobility     Tilt Bed    Modified Rankin (Stroke Patients Only)       Balance Overall balance assessment: Modified Independent                                          Communication Communication Communication: No apparent difficulties  Cognition Arousal: Alert Behavior During Therapy: WFL for tasks assessed/performed                           PT - Cognition Comments: WFL Following commands: Intact      Cueing    Exercises      General Comments General comments (skin integrity, edema, etc.): VSS. Discussed portable wound vac, activity level upon return home, and posture as pain decreased      Pertinent Vitals/Pain Pain Assessment Pain Assessment: Faces Faces Pain Scale: Hurts even more Pain Location: abdomen Pain Descriptors / Indicators: Discomfort, Operative site guarding, Moaning, Sharp Pain Intervention(s): Limited activity within patient's tolerance, Monitored during session    Home Living  Prior Function            PT Goals (current goals can now be found in the care plan section) Acute Rehab PT Goals Patient Stated Goal: Return Home PT Goal Formulation: With patient Time For Goal Achievement: 07/15/24 Potential to Achieve Goals: Good Progress towards PT goals: Goals met/education completed, patient discharged from PT    Frequency    Min 2X/week      PT Plan      Co-evaluation              AM-PAC PT 6 Clicks Mobility   Outcome  Measure  Help needed turning from your back to your side while in a flat bed without using bedrails?: None Help needed moving from lying on your back to sitting on the side of a flat bed without using bedrails?: None Help needed moving to and from a bed to a chair (including a wheelchair)?: None Help needed standing up from a chair using your arms (e.g., wheelchair or bedside chair)?: None Help needed to walk in hospital room?: None Help needed climbing 3-5 steps with a railing? : A Little 6 Click Score: 23    End of Session   Activity Tolerance: Patient tolerated treatment well Patient left: with call bell/phone within reach;in chair Nurse Communication: Mobility status PT Visit Diagnosis: Pain;Other abnormalities of gait and mobility (R26.89);Unsteadiness on feet (R26.81) Pain - Right/Left: Left Pain - part of body:  (Abdomen)     Time: 8983-8955 PT Time Calculation (min) (ACUTE ONLY): 28 min  Charges:    $Gait Training: 23-37 mins PT General Charges $$ ACUTE PT VISIT: 1 Visit                     Richerd Lipoma, PT  Acute Rehab Services Secure chat preferred Office [phone]    Richerd CROME Yanil Dawe 07/05/2024, 11:50 AM

## 2024-07-05 NOTE — Progress Notes (Signed)
 Nutrition Follow-up  DOCUMENTATION CODES:   Obesity unspecified  INTERVENTION:   Diet advancement per surgery- currently Soft diet, thin liquids  Continue Boost Breeze po daily each supplement provides 250 kcal and 9 grams of protein Continue 1 packet Juven BID, each packet provides 95 calories, 2.5 grams of protein (collagen) to support wound healing Add Prosource Plus BID, each supplement provides 100 kcal and 15 gm protein  Continue MVI with minerals daily Continue 100 mg Thiamine  x 5 days Monitor magnesium , potassium, and phosphorus daily for at least 3 days, MD to replete as needed, as pt is at risk for refeeding syndrome Discontinue Ensure d/t pt's preference   NUTRITION DIAGNOSIS:   Inadequate oral intake related to altered GI function as evidenced by meal completion < 50%, energy intake < or equal to 50% for > or equal to 5 days. - Still applicable   GOAL:   Patient will meet greater than or equal to 90% of their needs - Progressing   MONITOR:   PO intake, Supplement acceptance, Diet advancement, I & O's, Labs  REASON FOR ASSESSMENT:   NPO/Clear Liquid Diet    ASSESSMENT:   43 y.o. year old male with PMH of diverticulitis with complicated incarcerated hernia status post colostomy in 2021, HTN, prediabetes, GERD, and obstructive sleep apnea not on home CPAP. Presented with left sided abdominal pain with associated nausea and vomiting. Imaging showed SBO now s/p ex lap with LOA, SBR, repair of parastomal hernia, and revision of descending colostomy 7/1.  7/1 - s/p ex lap with LOA, SBR, repair of parastomal hernia, and revision of descending colostomy 7/5 - CLD 7/6 - CT abd/pelvis showed accumulation of fluid near anastomosis. Not a candidate for IR drainage. NPO 7/7 - CLD   Pt doing well still with pain at surgical site and stoma. Increasing intake, did well on FLD. Now on SOFT diet. Did not have BF but was waiting on lunch. Has been drinking 2-3 Boost Breeze per  day and 2 Juven per day. Did not like Ensure as it upset his stoamch. Willing to try prosoruce as CBG's going high. No N/V. Osomty with output.   Per op note 2 feet (60 cm) of small bowel taken out. Pt states he is lactose intolerant sometimes, depending on how he feels for the day.   Admit weight: 126.1 kg Current weight: 126.1 kg   Average Meal Intake: 7/10: 20% intake x 1 recorded meals   Intake/Output Summary (Last 24 hours) at 07/05/2024 1627 Last data filed at 07/05/2024 1501 Gross per 24 hour  Intake 240 ml  Output 1100 ml  Net -860 ml   Drains/Lines: JP drain  no output charted Wound vac abdomen, no output charted   Nutritionally Relevant Medications: Scheduled Meds:  (feeding supplement) PROSource Plus  30 mL Oral BID BM   acetaminophen   1,000 mg Oral QID   Chlorhexidine  Gluconate Cloth  6 each Topical Q0600   [START ON 07/06/2024] feeding supplement  1 Container Oral Daily   ketorolac   15 mg Intravenous Q6H   methocarbamol   1,000 mg Oral QID   multivitamin with minerals  1 tablet Oral Daily   nutrition supplement (JUVEN)  1 packet Oral BID BM   pantoprazole  (PROTONIX ) IV  40 mg Intravenous QHS   thiamine   100 mg Oral Daily   Continuous Infusions:  piperacillin -tazobactam (ZOSYN )  IV 3.375 g (07/05/24 1349)   Labs Reviewed: Calcium 8.4 Albumin  2.3 CBG ranges from 74-162 mg/dL over the last 24 hours  HgbA1c 5.6  Diet Order:   Diet Order             Diet NPO time specified  Diet effective midnight           DIET SOFT Room service appropriate? Yes; Fluid consistency: Thin  Diet effective now                   EDUCATION NEEDS:   Education needs have been addressed  Skin:  Skin Assessment: Skin Integrity Issues: Skin Integrity Issues:: Incisions Incisions: Surgical incision abdomen  Last BM:  no output charted via colosotmy x 2 4hours but ostomy is yielding stool  Height:   Ht Readings from Last 1 Encounters:  06/26/24 5' 6 (1.676 m)     Weight:   Wt Readings from Last 1 Encounters:  06/26/24 126.1 kg    Ideal Body Weight:  64.5 kg  BMI:  Body mass index is 44.87 kg/m.  Estimated Nutritional Needs:   Kcal:  2200-2400 kcal  Protein:  130-150 gm  Fluid:  >2L/day   Olivia Kenning, RD Registered Dietitian  See Amion for more information

## 2024-07-05 NOTE — Progress Notes (Signed)
 PROGRESS NOTE  Jordan [name] [place]  FMW:[id number] DOB: [birth year] DOA: 06/26/2024 PCP: [name], MD   Brief Narrative: Patient is a 43 year old male with history of diverticulitis complicated by incarcerated hernia status post colostomy in 2021, OSA not on CPAP, hypertension, morbid obesity, prediabetes who presented with left-sided abdominal pain after lifting heavy case of water, with associated  nausea and vomiting.  Imaging showed small bowel obstruction with possible incarceration and strangulation in the setting of large parastomal hernia containing sigmoid colon and small bowel.  Lactic acid was elevated on presentation.  General surgery consulted.  Taken to the OR and had expiratory laparotomy with LOA, small bowel resection, repair of parastomal hernia and revision of descending colostomy by Dr. Curvin on 7/1.  Started on PCA for pain control.  General surgery closely following.  Currently on IV Zosyn .  Overall clinically improving.  Assessment & Plan:  Principal Problem:   SBO (small bowel obstruction) (HCC) Active Problems:   Hypertensive urgency   Increased anion gap metabolic acidosis   Prediabetes  Small bowel obstruction: Presented with sudden onset of lower abdominal pain with nausea and vomiting. Imaging showed small bowel obstruction with possible incarceration and strangulation in the setting of large parastomal hernia containing sigmoid colon and small bowel.  Lactic acid was elevated on presentation.  General surgery consulted.  Taken to the OR and had expiratory laparotomy with LOA, small bowel resection, repair of parastomal hernia and revision of descending colostomy by Dr. Curvin on 7/1. Follow-up CT abdomen/pelvis on 7/6 showed fluid collection near anastomosis, body wall/flank edema.  No fever or leukocytosis.  Currently on IV Zosyn .  IR was consulted to check if he is a candidate for IR drainage but as per IR, intra-abdominal collection is not amenable to  drainage due to location and surrounding structures commended conservative management.  Continue PPI.  Currently on full liquid diet.  Colostomy is yielding  stool.  Management as per general surgery. He has been more comfortable since last 2 days.  Plan is to do a follow-up CT scan today.  PCA stopped  Hypertensive urgency/sinus tachycardia: Resolved.  On IV metoprolol  every 8 hour,now stopped  AKI: Resolved with IV fluid  Hyperglycemia:recent  A1c 5.6.  Monitor blood sugars  OSA: Not on CPAP.  On supplemental oxygen nightly.  Leukocytosis: Resolved  Morbid obesity: BMI of 44.8    Nutrition Problem: Inadequate oral intake Etiology: altered GI function    DVT prophylaxis:Place and maintain sequential compression device Start: 07/01/24 0609 SCD's Start: 06/26/24 2118     Code Status: Full Code  Family Communication: Cousin at bedside on 7/7  Patient status:Inpatient  Patient is from :Home  Anticipated discharge un:Ynfz  Estimated DC date:after clearance from general surgery.   Consultants: Surgery  Procedures: Exploratory laparatomy  Antimicrobials:  Anti-infectives (From admission, onward)    Start     Dose/Rate Route Frequency Ordered Stop   07/02/24 1000  piperacillin -tazobactam (ZOSYN ) IVPB 3.375 g        3.375 g 12.5 mL/hr over 240 Minutes Intravenous Every 8 hours 07/02/24 0952     06/27/24 0200  piperacillin -tazobactam (ZOSYN ) IVPB 3.375 g        3.375 g 12.5 mL/hr over 240 Minutes Intravenous Every 8 hours 06/27/24 0113 07/02/24 0715   06/26/24 2130  piperacillin -tazobactam (ZOSYN ) IVPB 3.375 g  Status:  Discontinued        3.375 g 12.5 mL/hr over 240 Minutes Intravenous Every 8 hours 06/26/24 2117 06/27/24 0113  06/26/24 0830  cefoTEtan  (CEFOTAN ) 2 g in sodium chloride  0.9 % 100 mL IVPB        2 g 200 mL/hr over 30 Minutes Intravenous On call to O.R. 06/26/24 0820 06/26/24 1335       Subjective:  Patient seen and examined at bedside today.   Comfortably lying in bed.  Talking on the phone.  Denies any significant abdominal pain today.  No nausea or vomiting today.  Colostomy yielding liquidy stool  Objective: Vitals:   07/04/24 1634 07/04/24 1956 07/05/24 0508 07/05/24 0805  BP: 134/88 (!) 138/92 118/70 (!) 140/72  Pulse: 93 86 61 79  Resp: 17 18 18 18   Temp: 98.5 F (36.9 C) 97.8 F (36.6 C) 98.1 F (36.7 C) 98.3 F (36.8 C)  TempSrc: Oral Oral  Oral  SpO2: 99% 100% 100% 100%  Weight:      Height:        Intake/Output Summary (Last 24 hours) at 07/05/2024 1119 Last data filed at 07/05/2024 9277 Gross per 24 hour  Intake --  Output 1100 ml  Net -1100 ml   Filed Weights   06/26/24 0214 06/26/24 0306 06/26/24 1140  Weight: 126.1 kg 126.1 kg 126.1 kg    Examination:   General exam: Overall comfortable, not in distress, morbidly obese HEENT: PERRL Respiratory system:  no wheezes or crackles  Cardiovascular system: S1 & S2 heard, RRR.  Gastrointestinal system: Abdomen is nondistended, soft and nontender.  Bowel sounds present.  Colostomy with liquid stool, midline wound VAC, JP drain Central nervous system: Alert and oriented Extremities: No edema, no clubbing ,no cyanosis Skin: No rashes, no ulcers,no icterus     Data Reviewed: I have personally reviewed following labs and imaging studies  CBC: Recent Labs  Lab 06/29/24 0622 06/30/24 0854 07/01/24 0529 07/02/24 0323 07/05/24 0330  WBC 8.9 8.9 8.1 9.7 8.9  HGB 13.0 12.9* 11.0* 10.7* 9.8*  HCT 42.6 42.1 34.3* 34.1* 31.2*  MCV 88.8 88.1 85.5 87.9 86.0  PLT 209 270 237 269 323   Basic Metabolic Panel: Recent Labs  Lab 06/29/24 0622 06/30/24 0854 07/01/24 0529 07/02/24 0323 07/04/24 0247 07/05/24 0330  NA 145 146* 136 141  --  135  K 4.2 3.8 3.4* 3.7 3.4* 3.9  CL 106 106 101 107  --  102  CO2 28 28 26 25   --  23  GLUCOSE 111* 99 121* 184*  --  106*  BUN 8 8 <5* <5*  --  7  CREATININE 0.89 1.16 0.82 0.79  --  0.76  CALCIUM 8.7* 9.0 8.3*  8.3*  --  8.4*  MG 2.1 2.3 1.8 2.1 1.8 1.8  PHOS 2.8 2.9 2.9 3.4 3.7 3.2     No results found for this or any previous visit (from the past 240 hours).   Radiology Studies: No results found.   Scheduled Meds:  acetaminophen   1,000 mg Oral QID   Chlorhexidine  Gluconate Cloth  6 each Topical Q0600   feeding supplement  1 Container Oral TID BM   feeding supplement  237 mL Oral BID BM   ketorolac   15 mg Intravenous Q6H   methocarbamol   1,000 mg Oral QID   multivitamin with minerals  1 tablet Oral Daily   nutrition supplement (JUVEN)  1 packet Oral BID BM   pantoprazole  (PROTONIX ) IV  40 mg Intravenous QHS   thiamine   100 mg Oral Daily   Continuous Infusions:  piperacillin -tazobactam (ZOSYN )  IV 3.375 g (07/05/24 0522)  LOS: 9 days   Ivonne Mustache, MD [facility] P7/09/2024, 11:19 AM

## 2024-07-05 NOTE — Consult Note (Signed)
 WOC Nurse ostomy follow up Pt is following by [facility] team due a surgical wound and ostomy. The pt revised a descending ostomy on 07/01 and repair of parastomal hernia. He know how to care an ostomy. He requested a visit to feel safe about leaking. I assess the area at 13:50, it was any signs of leaking or saturation of the barrier. I make sure that he is safe and the WOC team will follow him tomorrow and change the pouch.  The pt appreciated the visit.  The nurse team is aware about my consultation.  Thank-you,  Lela Holm BSN, RN, [employer], WOC  (Pager: [phone])

## 2024-07-05 NOTE — Progress Notes (Signed)
 9 Days Post-Op   Subjective/Chief Complaint: Ongoing pain in RLQ and around stoma. Tolerating FLD. +ostomy output. Walking and cleared PT  Vital signs in last 24 hours: Temp:  [97.8 F (36.6 C)-98.5 F (36.9 C)] 98.3 F (36.8 C) (07/10 0805) Pulse Rate:  [61-93] 79 (07/10 0805) Resp:  [17-18] 18 (07/10 0805) BP: (118-140)/(70-92) 140/72 (07/10 0805) SpO2:  [99 %-100 %] 100 % (07/10 0805) Last BM Date : 07/05/24  Intake/Output from previous day: 07/09 0701 - 07/10 0700 In: -  Out: 500 [Urine:500] Intake/Output this shift: Total I/O In: -  Out: 600 [Urine:600]  Gen: NAD CV: RRR Pulm: NWOB on room air Abd: Soft, mild tenderness to palpation around ostomy and midline surgical site, L flank with mild edema vs hematoma - no cellulitis. drain with minimal output, SS  Stoma viable with non-bloody semi-solid stool in pouch  VAC holding suction - cannister with cloudy SS drainage vs SS/purulent drainage    Lab Results:  Recent Labs    07/05/24 0330  WBC 8.9  HGB 9.8*  HCT 31.2*  PLT 323   BMET Recent Labs    07/04/24 0247 07/05/24 0330  NA  --  135  K 3.4* 3.9  CL  --  102  CO2  --  23  GLUCOSE  --  106*  BUN  --  7  CREATININE  --  0.76  CALCIUM  --  8.4*   PT/INR No results for input(s): LABPROT, INR in the last 72 hours. ABG No results for input(s): PHART, HCO3 in the last 72 hours.  Invalid input(s): PCO2, PO2  Studies/Results: No results found.   Anti-infectives: Anti-infectives (From admission, onward)    Start     Dose/Rate Route Frequency Ordered Stop   07/02/24 1000  piperacillin -tazobactam (ZOSYN ) IVPB 3.375 g        3.375 g 12.5 mL/hr over 240 Minutes Intravenous Every 8 hours 07/02/24 0952     06/27/24 0200  piperacillin -tazobactam (ZOSYN ) IVPB 3.375 g        3.375 g 12.5 mL/hr over 240 Minutes Intravenous Every 8 hours 06/27/24 0113 07/02/24 0715   06/26/24 2130  piperacillin -tazobactam (ZOSYN ) IVPB 3.375 g  Status:   Discontinued        3.375 g 12.5 mL/hr over 240 Minutes Intravenous Every 8 hours 06/26/24 2117 06/27/24 0113   06/26/24 0830  cefoTEtan  (CEFOTAN ) 2 g in sodium chloride  0.9 % 100 mL IVPB        2 g 200 mL/hr over 30 Minutes Intravenous On call to O.R. 06/26/24 0820 06/26/24 1335       Assessment/Plan: s/p Procedure(s) with comments: LAPAROTOMY, EXPLORATORY (N/A) - Lysis of adhesions, small bowel resection, repair of paristomal hernia, revision of decending colostomy on 06/26/24 with Dr. Curvin POD#9, afebrile, WBC WNL -  CT 7/6 w/ PO contrast shows contrast to the level of the colostomy. Pelvic fluid collection with hyperdense material and small amt gas. Differential: abscess, hematoma, or, less likely, small anastomotic leak from small bowel anastomosis. IR consulted and unable to drain. Plan repeat CT 7/11. Will make NPO MN as a precaution.  - Continue IV abx  - ileus resolved, advance to soft diet - VAC changes M/Th - schedule tylenol , toradol , and robaxin  today, encourage PO oxy   FEN: Soft, ensure ID: Zosyn  for pelvic fluid collection; UA from 7/6 negative Foley: none VTE: SCD's, lovenox   Dispo: Med-surg HH VAC ordered PT eval ordered - pt needs encouragement to get OOB and mobilize  LOS: 9 days    Jordan [name] Pringle 07/05/2024

## 2024-07-05 NOTE — Progress Notes (Signed)
Patient refused cbg 

## 2024-07-05 NOTE — TOC Progression Note (Signed)
 Transition of Care (TOC) - Progression Note   Received a call from [facility] with [facility] that patient's wife called their office and they want [facility] for home health . Arna has accepted referral for VAC dressing changes.   NCM called [name] and confirmed above.   NCM updated Mitch with [employer] on home health agency  Patient Details  Name: Jordan [name] MRN: [id number] Date of Birth: 01/21/1981  Transition of Care [facility]) CM/SW Contact  Jordan [name], Powell Jansky, RN Phone Number: 07/05/2024, 12:56 PM  Clinical Narrative:       Expected Discharge Plan: Home w Home Health Services Barriers to Discharge: Continued Medical Work up  Expected Discharge Plan and Services   Discharge Planning Services: CM Consult Post Acute Care Choice: Home Health, Durable Medical Equipment Living arrangements for the past 2 months: Single Family Home                 DME Arranged: Vac DME Agency: [employer] Date DME Agency Contacted: 07/04/24 Time DME Agency Contacted: 1003 Representative spoke with at DME Agency: Mitch HH Arranged:  (see note) HH Agency: NA         Social Determinants of Health (SDOH) Interventions SDOH Screenings   Food Insecurity: Patient Declined (06/27/2024)  Housing: Patient Declined (06/27/2024)  Transportation Needs: Patient Declined (06/27/2024)  Utilities: Patient Declined (06/27/2024)  Alcohol Screen: Low Risk  (05/31/2024)  Depression (PHQ2-9): Medium Risk (05/31/2024)  Financial Resource Strain: Low Risk  (05/31/2024)  Physical Activity: Insufficiently Active (05/31/2024)  Social Connections: Moderately Isolated (05/31/2024)  Stress: Stress Concern Present (05/31/2024)  Tobacco Use: Medium Risk (06/26/2024)  Health Literacy: Adequate Health Literacy (05/31/2024)    Readmission Risk Interventions     No data to display

## 2024-07-06 ENCOUNTER — Inpatient Hospital Stay (HOSPITAL_COMMUNITY)

## 2024-07-06 DIAGNOSIS — K56609 Unspecified intestinal obstruction, unspecified as to partial versus complete obstruction: Secondary | ICD-10-CM | POA: Diagnosis not present

## 2024-07-06 LAB — GLUCOSE, CAPILLARY
Glucose-Capillary: 130 mg/dL — ABNORMAL HIGH (ref 70–99)
Glucose-Capillary: 71 mg/dL (ref 70–99)
Glucose-Capillary: 109 mg/dL — ABNORMAL HIGH (ref 70–99)
Glucose-Capillary: 83 mg/dL (ref 70–99)
Glucose-Capillary: 126 mg/dL — ABNORMAL HIGH (ref 70–99)

## 2024-07-06 LAB — MAGNESIUM: Magnesium: 2 mg/dL (ref 1.7–2.4)

## 2024-07-06 LAB — PHOSPHORUS: Phosphorus: 3.5 mg/dL (ref 2.5–4.6)

## 2024-07-06 LAB — POTASSIUM: Potassium: 3.8 mmol/L (ref 3.5–5.1)

## 2024-07-06 MED ORDER — GABAPENTIN 300 MG PO CAPS
300.0000 mg | ORAL_CAPSULE | Freq: Three times a day (TID) | ORAL | Status: DC
  Administered 2024-07-06 – 2024-07-08 (×6): 300 mg via ORAL
  Filled 2024-07-06 (×6): qty 1

## 2024-07-06 MED ORDER — IOHEXOL 350 MG/ML SOLN
75.0000 mL | Freq: Once | INTRAVENOUS | Status: AC | PRN
  Administered 2024-07-06: 75 mL via INTRAVENOUS

## 2024-07-06 MED ORDER — HYDROMORPHONE HCL 1 MG/ML IJ SOLN
0.5000 mg | INTRAMUSCULAR | Status: DC | PRN
  Administered 2024-07-06 – 2024-07-08 (×2): 0.5 mg via INTRAVENOUS
  Filled 2024-07-06 (×2): qty 0.5

## 2024-07-06 MED ORDER — POLYETHYLENE GLYCOL 3350 17 G PO PACK
17.0000 g | PACK | Freq: Every day | ORAL | Status: DC
  Administered 2024-07-06 – 2024-07-07 (×2): 17 g via ORAL
  Filled 2024-07-06 (×3): qty 1

## 2024-07-06 MED ORDER — POLYETHYLENE GLYCOL 3350 17 G PO PACK: 17.0000 g | PACK | Freq: Every day | ORAL | Status: DC | PRN

## 2024-07-06 MED ORDER — BISACODYL 10 MG RE SUPP
10.0000 mg | Freq: Once | RECTAL | Status: DC
  Filled 2024-07-06: qty 1

## 2024-07-06 MED ORDER — OXYCODONE HCL 5 MG PO TABS
10.0000 mg | ORAL_TABLET | ORAL | Status: DC | PRN
  Administered 2024-07-06 (×2): 15 mg via ORAL
  Administered 2024-07-06: 10 mg via ORAL
  Administered 2024-07-06 – 2024-07-08 (×11): 15 mg via ORAL
  Filled 2024-07-06 (×11): qty 3
  Filled 2024-07-06: qty 2

## 2024-07-06 NOTE — Plan of Care (Signed)

## 2024-07-06 NOTE — Progress Notes (Signed)
 Occupational Therapy Treatment Patient Details Name: Jordan [name] MRN: [id number] DOB: 1981-01-25 Today's Date: 07/06/2024   History of present illness Pt is a 43 y/o male presenting on 7/1 with L sided abdominal pain and emesis. Admitted with SBO with possible incarceration and strangulation in setting of parastomal hernia. 7/1 s/p exlap, small bowel resection, repair of hernia and revision of descending colostomy.  PMH includes: asthma, HTN, OA, recurrent UTI, sleep apnea, R TKR.   OT comments  Patient in bathroom upon entry completing toileting and grooming. Patient completely dressed and states he was able to perform without assistance but did require increased time. Patient performed mobility in room and hallway with IV pole and no physical assistance or LOB. Discharge recommendations continue to be appropriate for no OT follow up and OT to sign off due to meeting all goals.       If plan is discharge home, recommend the following:  Assistance with cooking/housework;Assist for transportation   Equipment Recommendations  None recommended by OT    Recommendations for Other Services      Precautions / Restrictions Precautions Precautions: Fall;Other (comment) Recall of Precautions/Restrictions: Impaired Precaution/Restrictions Comments: abdominal precautions; L abd drain Restrictions Weight Bearing Restrictions Per Provider Order: No       Mobility Bed Mobility Overal bed mobility: Modified Independent             General bed mobility comments: OOB    Transfers Overall transfer level: Modified independent Equipment used: None               General transfer comment: patient performing mobility indepenently in room     Balance Overall balance assessment: Modified Independent                                         ADL either performed or assessed with clinical judgement   ADL Overall ADL's : Needs  assistance/impaired Eating/Feeding: Modified independent;Sitting   Grooming: Modified independent                   Toilet Transfer: Modified Independent             General ADL Comments: patient in bathroom upon entry and completing toileting and grooming tasks. Patient fully dressed and states he performed without assistance but did require increased time    Extremity/Trunk Assessment              Vision       Perception     Praxis     Communication Communication Communication: No apparent difficulties   Cognition Arousal: Alert Behavior During Therapy: WFL for tasks assessed/performed               OT - Cognition Comments: improved cognition and safety                 Following commands: Intact        Cueing   Cueing Techniques: Verbal cues  Exercises      Shoulder Instructions       General Comments VSS    Pertinent Vitals/ Pain       Pain Assessment Pain Assessment: Faces Faces Pain Scale: Hurts little more Pain Location: abdomen Pain Descriptors / Indicators: Discomfort, Operative site guarding, Sharp Pain Intervention(s): Monitored during session, Repositioned  Home Living  Prior Functioning/Environment              Frequency  Min 2X/week        Progress Toward Goals  OT Goals(current goals can now be found in the care plan section)  Progress towards OT goals: Progressing toward goals  Acute Rehab OT Goals Patient Stated Goal: to go home OT Goal Formulation: With patient Time For Goal Achievement: 07/15/24 Potential to Achieve Goals: Good ADL Goals Pt Will Perform Grooming: with modified independence;standing;sitting Pt Will Perform Lower Body Dressing: with modified independence;sit to/from stand;sitting/lateral leans;with adaptive equipment Pt Will Transfer to Toilet: with modified independence;ambulating Pt Will Perform Toileting - Clothing  Manipulation and hygiene: with modified independence;sit to/from stand;sitting/lateral leans Additional ADL Goal #1: Pt will maintain SpO2 >90% using PLB without cueing during ADls.  Plan      Co-evaluation                 AM-PAC OT 6 Clicks Daily Activity     Outcome Measure   Help from another person eating meals?: None Help from another person taking care of personal grooming?: None Help from another person toileting, which includes using toliet, bedpan, or urinal?: None Help from another person bathing (including washing, rinsing, drying)?: None Help from another person to put on and taking off regular upper body clothing?: None Help from another person to put on and taking off regular lower body clothing?: None 6 Click Score: 24    End of Session Equipment Utilized During Treatment: Other (comment) (wound vac)  OT Visit Diagnosis: Other abnormalities of gait and mobility (R26.89);Muscle weakness (generalized) (M62.81);Pain Pain - part of body:  (abd)   Activity Tolerance Patient tolerated treatment well   Patient Left in chair;with call bell/phone within reach   Nurse Communication Mobility status        Time: 9093-9074 OT Time Calculation (min): 19 min  Charges: OT General Charges $OT Visit: 1 Visit OT Treatments $Self Care/Home Management : 8-22 mins  .[email or web address]  Jordan [name] 07/06/2024, 12:12 PM

## 2024-07-06 NOTE — Plan of Care (Signed)
   Problem: Education: Goal: Knowledge of General Education information will improve Description Including pain rating scale, medication(s)/side effects and non-pharmacologic comfort measures Outcome: Progressing

## 2024-07-06 NOTE — Progress Notes (Signed)
 Interventional Radiology Brief Note:  IR consulted for review of CT Abd Pelv for pelvic fluid collection.  Patient with post-op collection concerning for dehiscence of the anastomosis vs. Abscess. Repeat imaging obtained by surgical team today and reviewed by Dr. Jenna for possible aspiration and drainage.  Pelvic collection with minimal increase in size since last exam with ongoing features concerning for abscess (rim enhancement, focal air) but remains unreachable for percutaneous drainage.   Shirely Toren, MS RD PA-C 12:05 PM

## 2024-07-06 NOTE — Discharge Instructions (Signed)
 [facility]      [facility], [place] [phone]  OPEN ABDOMINAL SURGERY: POST OP INSTRUCTIONS  Always review your discharge instruction sheet given to you by the facility where your surgery was performed.  IF YOU HAVE DISABILITY OR FAMILY LEAVE FORMS, YOU MUST BRING THEM TO THE OFFICE FOR PROCESSING.  PLEASE DO NOT GIVE THEM TO YOUR DOCTOR.  A prescription for pain medication may be given to you upon discharge.  Take your pain medication as prescribed, if needed.  If narcotic pain medicine is not needed, then you may take acetaminophen (Tylenol) or ibuprofen (Advil) as needed. Take your usually prescribed medications unless otherwise directed. If you need a refill on your pain medication, please contact your pharmacy. They will contact our office to request authorization.  Prescriptions will not be filled after 5pm or on week-ends. You should follow a light diet the first few days after arrival home, such as soup and crackers, pudding, etc.unless your doctor has advised otherwise. A high-fiber, low fat diet can be resumed as tolerated.   Be sure to include lots of fluids daily. Most patients will experience some swelling and bruising on the chest and neck area.  Ice packs will help.  Swelling and bruising can take several days to resolve Most patients will experience some swelling and bruising in the area of the incision. Ice pack will help. Swelling and bruising can take several days to resolve..  It is common to experience some constipation if taking pain medication after surgery.  Increasing fluid intake and taking a stool softener will usually help or prevent this problem from occurring.  A mild laxative (Milk of Magnesia or Miralax) should be taken according to package directions if there are no bowel movements after 48 hours.  You may have steri-strips (small skin tapes) in place directly over the incision.  These strips should be left on the skin for 7-10 days.  If your surgeon used skin  glue on the incision, you may shower in 24 hours.  The glue will flake off over the next 2-3 weeks.  Any sutures or staples will be removed at the office during your follow-up visit. You may find that a light gauze bandage over your incision may keep your staples from being rubbed or pulled. You may shower and replace the bandage daily. ACTIVITIES:  You may resume regular (light) daily activities beginning the next day--such as daily self-care, walking, climbing stairs--gradually increasing activities as tolerated.  You may have sexual intercourse when it is comfortable.  Refrain from any heavy lifting or straining until approved by your doctor. You may drive when you no longer are taking prescription pain medication, you can comfortably wear a seatbelt, and you can safely maneuver your car and apply brakes Return to Work: ___________________________________ Jordan [name] should see your doctor in the office for a follow-up appointment approximately two weeks after your surgery.  Make sure that you call for this appointment within a day or two after you arrive home to insure a convenient appointment time. OTHER INSTRUCTIONS:  _____________________________________________________________ _____________________________________________________________  WHEN TO CALL YOUR DOCTOR: Fever over 101.0 Inability to urinate Nausea and/or vomiting Extreme swelling or bruising Continued bleeding from incision. Increased pain, redness, or drainage from the incision. Difficulty swallowing or breathing Muscle cramping or spasms. Numbness or tingling in hands or feet or around lips.  The clinic staff is available to answer your questions during regular business hours.  Please don't hesitate to call and ask to speak to one of  the nurses if you have concerns.  For further questions, please visit [email or web address]

## 2024-07-06 NOTE — Consult Note (Signed)
 WOC Nurse wound follow up Wound type: surgical Measurement: 21.5 cm x 9 cm x 4.5 cm Wound bed: red, moist, granulation tissue present throughout wound bed Drainage (amount, consistency, odor) serosanguinous in cannister Periwound: intact Dressing procedure/placement/frequency: Removed old NPWT dressing Cleansed wound with normal saline  Filled wound with   __1__ piece of black foam, barrier ring utilized at the umbilicus and inferior aspect of wound. Sealed NPWT dressing at HG/ Patient received PO pain medication per bedside nurse prior to dressing change Patient tolerated procedure well  WOC nurse will continue to provide NPWT dressing changed due to the complexity of the dressing change.   WOC Nurse ostomy follow up Stoma type/location: LMQ colostomy revision Stomal assessment/size: 30 mm oval, below skin level, red, moist Peristomal assessment: intact Treatment options for stomal/peristomal skin: 2 inch barrier ring Output brown stool Ostomy pouching: 2pc. Soft convex.  Soft convex skin barrier (lawson [phone]) ostomy pouch (649) barrier ring (lawson # [phone])  Education provided: patient and spouse is independent with ostomy care at home.  Enrolled patient in [facility] Secure Start Discharge program: NO - utilizes Medical illustrator at home.  WOC team will continue to follow for NPWT and ostomy.  Thank you,  Doyal Polite, RN, MSN, [facility] WOC Team

## 2024-07-06 NOTE — Progress Notes (Signed)
 10 Days Post-Op   Subjective/Chief Complaint: Ongoing pain, getting VAC changed. Having ostomy output. Tolerating diet Vital signs in last 24 hours: Temp:  [97.4 F (36.3 C)-98.3 F (36.8 C)] 97.4 F (36.3 C) (07/11 0817) Pulse Rate:  [74-105] 84 (07/11 0817) Resp:  [18] 18 (07/11 0817) BP: (114-137)/(65-78) 133/65 (07/11 0817) SpO2:  [74 %-100 %] 74 % (07/11 0817) Last BM Date : 07/05/24  Intake/Output from previous day: 07/10 0701 - 07/11 0700 In: 240 [P.O.:240] Out: 2900 [Urine:2900] Intake/Output this shift: No intake/output data recorded.  Gen: NAD CV: RRR Pulm: NWOB on room air Abd: Soft, mild tenderness to palpation around ostomy and midline surgical site, L flank with mild edema vs hematoma - no cellulitis. drain with minimal output, SS  Stoma viable with non-bloody semi-solid stool in pouch  Midline wound with beefy granulation tissue    Lab Results:  Recent Labs    07/05/24 0330  WBC 8.9  HGB 9.8*  HCT 31.2*  PLT 323   BMET Recent Labs    07/05/24 0330 07/06/24 0415  NA 135  --   K 3.9 3.8  CL 102  --   CO2 23  --   GLUCOSE 106*  --   BUN 7  --   CREATININE 0.76  --   CALCIUM 8.4*  --    PT/INR No results for input(s): LABPROT, INR in the last 72 hours. ABG No results for input(s): PHART, HCO3 in the last 72 hours.  Invalid input(s): PCO2, PO2  Studies/Results: CT ABDOMEN PELVIS W CONTRAST Result Date: 07/06/2024 CLINICAL DATA:  Postoperative abdominal pain. EXAM: CT ABDOMEN AND PELVIS WITH CONTRAST TECHNIQUE: Multidetector CT imaging of the abdomen and pelvis was performed using the standard protocol following bolus administration of intravenous contrast. RADIATION DOSE REDUCTION: This exam was performed according to the departmental dose-optimization program which includes automated exposure control, adjustment of the mA and/or kV according to patient size and/or use of iterative reconstruction technique. CONTRAST:  75mL OMNIPAQUE   IOHEXOL  350 MG/ML SOLN COMPARISON:  July 01, 2024. FINDINGS: Lower chest: No acute abnormality. Hepatobiliary: No focal liver abnormality is seen. No gallstones, gallbladder wall thickening, or biliary dilatation. Pancreas: Unremarkable. No pancreatic ductal dilatation or surrounding inflammatory changes. Spleen: Normal in size without focal abnormality. Adrenals/Urinary Tract: Adrenal glands are unremarkable. Kidneys are normal, without renal calculi, focal lesion, or hydronephrosis. Bladder is unremarkable. Stomach/Bowel: Stomach is unremarkable. Continued presence of colostomy seen in left lower quadrant. No abnormal bowel dilatation. Stable presence of surgical drain in fluid collection around peristomal hernia. This fluid collection currently measures 7.3 x 3.6 cm which is slightly decreased compared to prior exam. Vascular/Lymphatic: No significant vascular findings are present. No enlarged abdominal or pelvic lymph nodes. Reproductive: Prostate is unremarkable. Other: 7.7 x 7.0 cm fluid collection is noted in the pelvis which is slightly enlarged compared to prior exam and concerning for possible abscess. Musculoskeletal: No acute or significant osseous findings. IMPRESSION: Colostomy is again noted left lower quadrant. Stable presence of surgical drain in fluid collection around peristomal hernia which currently measures 7.3 x 3.6 cm which is slightly decreased compared to prior exam. 7.7 x 7.0 cm fluid collection is noted in the pelvis which is slightly enlarged compared to prior exam and concerning for possible abscess. Electronically Signed   By: Lynwood Landy Raddle M.D.   On: 07/06/2024 10:46     Anti-infectives: Anti-infectives (From admission, onward)    Start     Dose/Rate Route Frequency Ordered Stop  07/02/24 1000  piperacillin -tazobactam (ZOSYN ) IVPB 3.375 g        3.375 g 12.5 mL/hr over 240 Minutes Intravenous Every 8 hours 07/02/24 0952     06/27/24 0200  piperacillin -tazobactam (ZOSYN )  IVPB 3.375 g        3.375 g 12.5 mL/hr over 240 Minutes Intravenous Every 8 hours 06/27/24 0113 07/02/24 0715   06/26/24 2130  piperacillin -tazobactam (ZOSYN ) IVPB 3.375 g  Status:  Discontinued        3.375 g 12.5 mL/hr over 240 Minutes Intravenous Every 8 hours 06/26/24 2117 06/27/24 0113   06/26/24 0830  cefoTEtan  (CEFOTAN ) 2 g in sodium chloride  0.9 % 100 mL IVPB        2 g 200 mL/hr over 30 Minutes Intravenous On call to O.R. 06/26/24 0820 06/26/24 1335       Assessment/Plan: s/p Procedure(s) with comments: LAPAROTOMY, EXPLORATORY (N/A) - Lysis of adhesions, small bowel resection, repair of paristomal hernia, revision of decending colostomy on 06/26/24 with Dr. Curvin POD#10, afebrile, WBC WNL -  CT 7/6 w/ PO contrast shows contrast to the level of the colostomy. Pelvic fluid collection with hyperdense material and small amt gas. Differential: abscess, hematoma, or, less likely, small anastomotic leak from small bowel anastomosis. IR consulted and unable to drain.  - repeat CT this AM with persistent pelvic collection - discussed with IR and still unable to drain - on CT today drain appears to be in fluid around stoma but clinically not putting out much - Continue IV abx  - ok to have reg diet - VAC changes T/F - continue scheduled tylenol , toradol , and robaxin . Increased PRN oxy to q4h prn and decreased dose of IV dilaudid    FEN: reg, ensure ID: [email or web address]  for pelvic fluid collection; UA from 7/6 negative Foley: none VTE: SCD's, lovenox   Dispo: Med-surg HH VAC ordered PT eval ordered - pt needs encouragement to get OOB and mobilize    LOS: 10 days   Burnard JONELLE Louder, [facility] 07/06/2024, 12:06 PM Please see Amion for pager number during day hours 7:00am-4:30pm

## 2024-07-06 NOTE — Progress Notes (Signed)
 PROGRESS NOTE  Jordan [name]  FMW:[id number] DOB: [birth year] DOA: 06/26/2024 PCP: [name], MD   Brief Narrative: Patient is a 43 year old male with history of diverticulitis complicated by incarcerated hernia status post colostomy in 2021, OSA not on CPAP, hypertension, morbid obesity, prediabetes who presented with left-sided abdominal pain after lifting heavy case of water, with associated  nausea and vomiting.  Imaging showed small bowel obstruction with possible incarceration and strangulation in the setting of large parastomal hernia containing sigmoid colon and small bowel.  Lactic acid was elevated on presentation.  General surgery consulted.  Taken to the OR and had expiratory laparotomy with LOA, small bowel resection, repair of parastomal hernia and revision of descending colostomy by Dr. Curvin on 7/1.  Started on PCA for pain control.  General surgery closely following.  Currently on IV Zosyn .    Assessment & Plan:  Principal Problem:   SBO (small bowel obstruction) (HCC) Active Problems:   Hypertensive urgency   Increased anion gap metabolic acidosis   Prediabetes  Small bowel obstruction: Presented with sudden onset of lower abdominal pain with nausea and vomiting. Imaging showed small bowel obstruction with possible incarceration and strangulation in the setting of large parastomal hernia containing sigmoid colon and small bowel.  Lactic acid was elevated on presentation.  General surgery consulted.  Taken to the OR and had expiratory laparotomy with LOA, small bowel resection, repair of parastomal hernia and revision of descending colostomy by Dr. Curvin on 7/1. Follow-up CT abdomen/pelvis on 7/6 showed fluid collection near anastomosis, body wall/flank edema.  No fever or leukocytosis.  Currently on IV Zosyn .  IR was consulted to check if he is a candidate for IR drainage but as per IR, intra-abdominal collection is not amenable to drainage due to location and  surrounding structures commended conservative management.  Continue PPI.    Colostomy is yielding  stool. On soft diet. CT abd/pelvis follow up today showed   presence of surgical drain in fluid collection around peristomal hernia with size  slightly decreased compared to prior exam,7.7 x 7.0 cm fluid collection in the pelvis which is slightly enlarged compared to prior exam and concerning for possible abscess.  General surgery closely following.  Hypertensive urgency/sinus tachycardia: Resolved.  On IV metoprolol  every 8 hour,now stopped  AKI: Resolved with IV fluid  Hyperglycemia:recent  A1c 5.6.  Monitor blood sugars  OSA: Not on CPAP.  On supplemental oxygen nightly.  Leukocytosis: Resolved  Morbid obesity: BMI of 44.8    Nutrition Problem: Inadequate oral intake Etiology: altered GI function    DVT prophylaxis:Place and maintain sequential compression device Start: 07/01/24 0609 SCD's Start: 06/26/24 2118     Code Status: Full Code  Family Communication: Cousin at bedside on 7/7  Patient status:Inpatient  Patient is from :Home  Anticipated discharge un:Ynfz  Estimated DC date:after clearance from general surgery.   Consultants: Surgery  Procedures: Exploratory laparatomy  Antimicrobials:  Anti-infectives (From admission, onward)    Start     Dose/Rate Route Frequency Ordered Stop   07/02/24 1000  piperacillin -tazobactam (ZOSYN ) IVPB 3.375 g        3.375 g 12.5 mL/hr over 240 Minutes Intravenous Every 8 hours 07/02/24 0952     06/27/24 0200  piperacillin -tazobactam (ZOSYN ) IVPB 3.375 g        3.375 g 12.5 mL/hr over 240 Minutes Intravenous Every 8 hours 06/27/24 0113 07/02/24 0715   06/26/24 2130  piperacillin -tazobactam (ZOSYN ) IVPB 3.375 g  Status:  Discontinued  3.375 g 12.5 mL/hr over 240 Minutes Intravenous Every 8 hours 06/26/24 2117 06/27/24 0113   06/26/24 0830  cefoTEtan  (CEFOTAN ) 2 g in sodium chloride  0.9 % 100 mL IVPB        2 g 200  mL/hr over 30 Minutes Intravenous On call to O.R. 06/26/24 0820 06/26/24 1335       Subjective:  Patient seen and examined at bedside today.  He was sitting on the chair.  Colostomy yielding brown semisolid stool.  He still have some abdominal discomfort but does not look uncomfortable.  No nausea or vomiting no fever  Objective: Vitals:   07/05/24 1749 07/05/24 2030 07/06/24 0327 07/06/24 0817  BP: 137/76 120/78 114/73 133/65  Pulse: (!) 105 74 79 84  Resp: 18 18 18 18   Temp: 98.2 F (36.8 C) 98.3 F (36.8 C) 97.8 F (36.6 C) (!) 97.4 F (36.3 C)  TempSrc: Oral Oral Oral Oral  SpO2: 100% 100% 98% (!) 74%  Weight:      Height:        Intake/Output Summary (Last 24 hours) at 07/06/2024 1051 Last data filed at 07/06/2024 0500 Gross per 24 hour  Intake 240 ml  Output 2300 ml  Net -2060 ml   Filed Weights   06/26/24 0214 06/26/24 0306 06/26/24 1140  Weight: 126.1 kg 126.1 kg 126.1 kg    Examination:   General exam: Overall comfortable, not in distress, morbidly obese HEENT: PERRL Respiratory system:  no wheezes or crackles  Cardiovascular system: S1 & S2 heard, RRR.  Gastrointestinal system: Abdomen is mildly distended, soft and nontender.  Midline abdominal wound VAC, colostomy, left lower quadrant abdominal drain Central nervous system: Alert and oriented Extremities: No edema, no clubbing ,no cyanosis Skin: No rashes, no ulcers,no icterus       Data Reviewed: I have personally reviewed following labs and imaging studies  CBC: Recent Labs  Lab 06/30/24 0854 07/01/24 0529 07/02/24 0323 07/05/24 0330  WBC 8.9 8.1 9.7 8.9  HGB 12.9* 11.0* 10.7* 9.8*  HCT 42.1 34.3* 34.1* 31.2*  MCV 88.1 85.5 87.9 86.0  PLT 270 237 269 323   Basic Metabolic Panel: Recent Labs  Lab 06/30/24 0854 07/01/24 0529 07/02/24 0323 07/04/24 0247 07/05/24 0330 07/06/24 0415  NA 146* 136 141  --  135  --   K 3.8 3.4* 3.7 3.4* 3.9 3.8  CL 106 101 107  --  102  --   CO2 28 26  25   --  23  --   GLUCOSE 99 121* 184*  --  106*  --   BUN 8 <5* <5*  --  7  --   CREATININE 1.16 0.82 0.79  --  0.76  --   CALCIUM 9.0 8.3* 8.3*  --  8.4*  --   MG 2.3 1.8 2.1 1.8 1.8 2.0  PHOS 2.9 2.9 3.4 3.7 3.2 3.5     No results found for this or any previous visit (from the past 240 hours).   Radiology Studies: CT ABDOMEN PELVIS W CONTRAST Result Date: 07/06/2024 CLINICAL DATA:  Postoperative abdominal pain. EXAM: CT ABDOMEN AND PELVIS WITH CONTRAST TECHNIQUE: Multidetector CT imaging of the abdomen and pelvis was performed using the standard protocol following bolus administration of intravenous contrast. RADIATION DOSE REDUCTION: This exam was performed according to the departmental dose-optimization program which includes automated exposure control, adjustment of the mA and/or kV according to patient size and/or use of iterative reconstruction technique. CONTRAST:  75mL OMNIPAQUE  IOHEXOL  350 MG/ML  SOLN COMPARISON:  July 01, 2024. FINDINGS: Lower chest: No acute abnormality. Hepatobiliary: No focal liver abnormality is seen. No gallstones, gallbladder wall thickening, or biliary dilatation. Pancreas: Unremarkable. No pancreatic ductal dilatation or surrounding inflammatory changes. Spleen: Normal in size without focal abnormality. Adrenals/Urinary Tract: Adrenal glands are unremarkable. Kidneys are normal, without renal calculi, focal lesion, or hydronephrosis. Bladder is unremarkable. Stomach/Bowel: Stomach is unremarkable. Continued presence of colostomy seen in left lower quadrant. No abnormal bowel dilatation. Stable presence of surgical drain in fluid collection around peristomal hernia. This fluid collection currently measures 7.3 x 3.6 cm which is slightly decreased compared to prior exam. Vascular/Lymphatic: No significant vascular findings are present. No enlarged abdominal or pelvic lymph nodes. Reproductive: Prostate is unremarkable. Other: 7.7 x 7.0 cm fluid collection is noted in the  pelvis which is slightly enlarged compared to prior exam and concerning for possible abscess. Musculoskeletal: No acute or significant osseous findings. IMPRESSION: Colostomy is again noted left lower quadrant. Stable presence of surgical drain in fluid collection around peristomal hernia which currently measures 7.3 x 3.6 cm which is slightly decreased compared to prior exam. 7.7 x 7.0 cm fluid collection is noted in the pelvis which is slightly enlarged compared to prior exam and concerning for possible abscess. Electronically Signed   By: Lynwood Landy Raddle M.D.   On: 07/06/2024 10:46     Scheduled Meds:  (feeding supplement) PROSource Plus  30 mL Oral BID BM   acetaminophen   1,000 mg Oral QID   Chlorhexidine  Gluconate Cloth  6 each Topical Q0600   feeding supplement  1 Container Oral Daily   ketorolac   15 mg Intravenous Q6H   methocarbamol   1,000 mg Oral QID   multivitamin with minerals  1 tablet Oral Daily   nutrition supplement (JUVEN)  1 packet Oral BID BM   pantoprazole  (PROTONIX ) IV  40 mg Intravenous QHS   thiamine   100 mg Oral Daily   Continuous Infusions:  piperacillin -tazobactam (ZOSYN )  IV 3.375 g (07/06/24 0517)     LOS: 10 days   Ivonne Mustache, MD [facility] P7/10/2024, 10:51 AM

## 2024-07-07 DIAGNOSIS — K56609 Unspecified intestinal obstruction, unspecified as to partial versus complete obstruction: Secondary | ICD-10-CM | POA: Diagnosis not present

## 2024-07-07 LAB — GLUCOSE, CAPILLARY
Glucose-Capillary: 133 mg/dL — ABNORMAL HIGH (ref 70–99)
Glucose-Capillary: 94 mg/dL (ref 70–99)
Glucose-Capillary: 117 mg/dL — ABNORMAL HIGH (ref 70–99)
Glucose-Capillary: 112 mg/dL — ABNORMAL HIGH (ref 70–99)

## 2024-07-07 MED ORDER — POLYETHYLENE GLYCOL 3350 17 G PO PACK: 17.0000 g | PACK | Freq: Every day | ORAL | Status: AC | PRN

## 2024-07-07 MED ORDER — AMOXICILLIN-POT CLAVULANATE 875-125 MG PO TABS
1.0000 | ORAL_TABLET | Freq: Two times a day (BID) | ORAL | Status: DC
  Administered 2024-07-07 – 2024-07-08 (×3): 1 via ORAL
  Filled 2024-07-07 (×3): qty 1

## 2024-07-07 NOTE — Progress Notes (Signed)
 11 Days Post-Op   Subjective/Chief Complaint: Ongoing pain while getting VAC changed. Having ostomy output. Tolerating diet.  Ambulating in hall.   Vital signs in last 24 hours: Temp:  [98.3 F (36.8 C)-98.8 F (37.1 C)] 98.7 F (37.1 C) (07/12 0857) Pulse Rate:  [69-101] 101 (07/12 0857) Resp:  [15-18] 18 (07/12 0857) BP: (120-136)/(70-84) 136/78 (07/12 0857) SpO2:  [100 %] 100 % (07/12 0857) Weight:  [129.5 kg] 129.5 kg (07/12 0400) Last BM Date : 07/05/24  Intake/Output from previous day: 07/11 0701 - 07/12 0700 In: 1042.2 [P.O.:560; IV Piggyback:482.2] Out: 705 [Urine:600; Drains:105] Intake/Output this shift: Total I/O In: 240 [P.O.:240] Out: 450 [Urine:450]  Gen: NAD CV: RRR Pulm: NWOB on room air Abd: Soft, mild tenderness to palpation, L flank with mild edema vs hematoma - no cellulitis. drain with minimal output, SS  Stoma viable with non-bloody semi-solid stool in pouch  Midline wound with beefy granulation tissue    Lab Results:  Recent Labs    07/05/24 0330  WBC 8.9  HGB 9.8*  HCT 31.2*  PLT 323   BMET Recent Labs    07/05/24 0330 07/06/24 0415  NA 135  --   K 3.9 3.8  CL 102  --   CO2 23  --   GLUCOSE 106*  --   BUN 7  --   CREATININE 0.76  --   CALCIUM 8.4*  --    PT/INR No results for input(s): LABPROT, INR in the last 72 hours. ABG No results for input(s): PHART, HCO3 in the last 72 hours.  Invalid input(s): PCO2, PO2  Studies/Results: CT ABDOMEN PELVIS W CONTRAST Result Date: 07/06/2024 CLINICAL DATA:  Postoperative abdominal pain. EXAM: CT ABDOMEN AND PELVIS WITH CONTRAST TECHNIQUE: Multidetector CT imaging of the abdomen and pelvis was performed using the standard protocol following bolus administration of intravenous contrast. RADIATION DOSE REDUCTION: This exam was performed according to the departmental dose-optimization program which includes automated exposure control, adjustment of the mA and/or kV according to  patient size and/or use of iterative reconstruction technique. CONTRAST:  75mL OMNIPAQUE  IOHEXOL  350 MG/ML SOLN COMPARISON:  July 01, 2024. FINDINGS: Lower chest: No acute abnormality. Hepatobiliary: No focal liver abnormality is seen. No gallstones, gallbladder wall thickening, or biliary dilatation. Pancreas: Unremarkable. No pancreatic ductal dilatation or surrounding inflammatory changes. Spleen: Normal in size without focal abnormality. Adrenals/Urinary Tract: Adrenal glands are unremarkable. Kidneys are normal, without renal calculi, focal lesion, or hydronephrosis. Bladder is unremarkable. Stomach/Bowel: Stomach is unremarkable. Continued presence of colostomy seen in left lower quadrant. No abnormal bowel dilatation. Stable presence of surgical drain in fluid collection around peristomal hernia. This fluid collection currently measures 7.3 x 3.6 cm which is slightly decreased compared to prior exam. Vascular/Lymphatic: No significant vascular findings are present. No enlarged abdominal or pelvic lymph nodes. Reproductive: Prostate is unremarkable. Other: 7.7 x 7.0 cm fluid collection is noted in the pelvis which is slightly enlarged compared to prior exam and concerning for possible abscess. Musculoskeletal: No acute or significant osseous findings. IMPRESSION: Colostomy is again noted left lower quadrant. Stable presence of surgical drain in fluid collection around peristomal hernia which currently measures 7.3 x 3.6 cm which is slightly decreased compared to prior exam. 7.7 x 7.0 cm fluid collection is noted in the pelvis which is slightly enlarged compared to prior exam and concerning for possible abscess. Electronically Signed   By: Lynwood Landy Raddle M.D.   On: 07/06/2024 10:46     Anti-infectives: Anti-infectives (From admission,  onward)    Start     Dose/Rate Route Frequency Ordered Stop   07/02/24 1000  piperacillin -tazobactam (ZOSYN ) IVPB 3.375 g        3.375 g 12.5 mL/hr over 240 Minutes  Intravenous Every 8 hours 07/02/24 0952     06/27/24 0200  piperacillin -tazobactam (ZOSYN ) IVPB 3.375 g        3.375 g 12.5 mL/hr over 240 Minutes Intravenous Every 8 hours 06/27/24 0113 07/02/24 0715   06/26/24 2130  piperacillin -tazobactam (ZOSYN ) IVPB 3.375 g  Status:  Discontinued        3.375 g 12.5 mL/hr over 240 Minutes Intravenous Every 8 hours 06/26/24 2117 06/27/24 0113   06/26/24 0830  cefoTEtan  (CEFOTAN ) 2 g in sodium chloride  0.9 % 100 mL IVPB        2 g 200 mL/hr over 30 Minutes Intravenous On call to O.R. 06/26/24 0820 06/26/24 1335       Assessment/Plan: s/p Procedure(s) with comments: LAPAROTOMY, EXPLORATORY (N/A) - Lysis of adhesions, small bowel resection, repair of paristomal hernia, revision of decending colostomy on 06/26/24 with Dr. Curvin POD#11, afebrile, WBC WNL -  CT 7/6 w/ PO contrast shows contrast to the level of the colostomy. Pelvic fluid collection with hyperdense material and small amt gas. Differential: abscess, hematoma, or, less likely, small anastomotic leak from small bowel anastomosis. IR consulted and unable to drain.  - repeat CT this AM with persistent pelvic collection - discussed with IR and still unable to drain - on CT today drain appears to be in fluid around stoma but clinically not putting out much - switched to PO abx - ok to have reg diet - VAC changes T/F - continue scheduled tylenol , toradol , and robaxin . Increased PRN oxy to q4h prn and decreased dose of IV dilaudid    FEN: reg, ensure ID: Zosyn  for pelvic fluid collection; UA from 7/6 negative Foley: none VTE: SCD's, lovenox   Dispo: Med-surg, ok for d/c when home health arranged and tolerating pain with PO regimen HH VAC ordered PT eval ordered - pt needs encouragement to get OOB and mobilize    LOS: 11 days   Jordan JAYSON Ned, MD  Colorectal and General Surgery [facility] Surgery

## 2024-07-07 NOTE — Progress Notes (Signed)
 PROGRESS NOTE  Jordan [name]  FMW:[id number] DOB: [birth year] DOA: 06/26/2024 PCP: [name], MD   Brief Narrative: Patient is a 43 year old male with history of diverticulitis complicated by incarcerated hernia status post colostomy in 2021, OSA not on CPAP, hypertension, morbid obesity, prediabetes who presented with left-sided abdominal pain after lifting heavy case of water, with associated  nausea and vomiting.  Imaging showed small bowel obstruction with possible incarceration and strangulation in the setting of large parastomal hernia containing sigmoid colon and small bowel.  Lactic acid was elevated on presentation.  General surgery consulted.  Taken to the OR and had expiratory laparotomy with LOA, small bowel resection, repair of parastomal hernia and revision of descending colostomy by Dr. Curvin on 7/1.  Started on PCA for pain control.  General surgery closely following.  Currently on IV Zosyn .  Patient is eager to go home.  Will wait for general surgery clearance for discharge.  Assessment & Plan:  Principal Problem:   SBO (small bowel obstruction) (HCC) Active Problems:   Hypertensive urgency   Increased anion gap metabolic acidosis   Prediabetes  Small bowel obstruction: Presented with sudden onset of lower abdominal pain with nausea and vomiting. Imaging showed small bowel obstruction with possible incarceration and strangulation in the setting of large parastomal hernia containing sigmoid colon and small bowel.  Lactic acid was elevated on presentation.  General surgery consulted.  Taken to the OR and had expiratory laparotomy with LOA, small bowel resection, repair of parastomal hernia and revision of descending colostomy by Dr. Curvin on 7/1. Follow-up CT abdomen/pelvis on 7/6 showed fluid collection near anastomosis, body wall/flank edema.  No fever or leukocytosis.  Currently on IV Zosyn .  IR was consulted to check if he is a candidate for IR drainage but as  per IR, intra-abdominal collection is not amenable to drainage due to location and surrounding structures commended conservative management.  Continue PPI.    Colostomy is yielding  stool.  CT abd/pelvis follow up on 7/11  showed   presence of surgical drain in fluid collection around peristomal hernia with size  slightly decreased compared to prior exam,7.7 x 7.0 cm fluid collection in the pelvis which is slightly enlarged compared to prior exam and concerning for possible abscess.  General surgery closely following.  IR again reconsulted but IR says this is not an amenable for drainage. Patient tolerating solid diet  Hypertensive urgency/sinus tachycardia: Resolved.  On IV metoprolol  every 8 hour,now stopped  AKI: Resolved with IV fluid  Hyperglycemia:recent  A1c 5.6.  Monitor blood sugars  OSA: Not on CPAP.  On supplemental oxygen nightly.  Leukocytosis: Resolved  Morbid obesity: BMI of 44.8    Nutrition Problem: Inadequate oral intake Etiology: altered GI function    DVT prophylaxis:Place and maintain sequential compression device Start: 07/01/24 0609 SCD's Start: 06/26/24 2118     Code Status: Full Code  Family Communication: Cousin at bedside on 7/7  Patient status:Inpatient  Patient is from :Home  Anticipated discharge un:Ynfz  Estimated DC date:after clearance from general surgery.   Consultants: Surgery  Procedures: Exploratory laparatomy  Antimicrobials:  Anti-infectives (From admission, onward)    Start     Dose/Rate Route Frequency Ordered Stop   07/07/24 1115  amoxicillin -clavulanate (AUGMENTIN ) 875-125 MG per tablet 1 tablet        1 tablet Oral Every 12 hours 07/07/24 1027 07/17/24 0959   07/02/24 1000  piperacillin -tazobactam (ZOSYN ) IVPB 3.375 g  Status:  Discontinued  3.375 g 12.5 mL/hr over 240 Minutes Intravenous Every 8 hours 07/02/24 0952 07/07/24 1027   06/27/24 0200  piperacillin -tazobactam (ZOSYN ) IVPB 3.375 g        3.375 g 12.5  mL/hr over 240 Minutes Intravenous Every 8 hours 06/27/24 0113 07/02/24 0715   06/26/24 2130  piperacillin -tazobactam (ZOSYN ) IVPB 3.375 g  Status:  Discontinued        3.375 g 12.5 mL/hr over 240 Minutes Intravenous Every 8 hours 06/26/24 2117 06/27/24 0113   06/26/24 0830  cefoTEtan  (CEFOTAN ) 2 g in sodium chloride  0.9 % 100 mL IVPB        2 g 200 mL/hr over 30 Minutes Intravenous On call to O.R. 06/26/24 0820 06/26/24 1335       Subjective:  Patient seen and examined at bedside today.  Appears comfortable.  Lying in bed.  Tolerating soft diet.  Colostomy helping stool.  He was asking when he can go home.  No nausea vomiting or abdominal pain today  Objective: Vitals:   07/06/24 1636 07/06/24 1938 07/07/24 0400 07/07/24 0857  BP: 134/84 130/83 120/70 136/78  Pulse: 69 81 91 (!) 101  Resp: 15 18 18 18   Temp: 98.3 F (36.8 C) 98.8 F (37.1 C) 98.6 F (37 C) 98.7 F (37.1 C)  TempSrc: Oral Oral Oral Oral  SpO2: 100% 100% 100% 100%  Weight:   129.5 kg   Height:        Intake/Output Summary (Last 24 hours) at 07/07/2024 1032 Last data filed at 07/07/2024 0900 Gross per 24 hour  Intake 1162.2 ml  Output 555 ml  Net 607.2 ml   Filed Weights   06/26/24 0306 06/26/24 1140 07/07/24 0400  Weight: 126.1 kg 126.1 kg 129.5 kg    Examination:    General exam: Overall comfortable, not in distress, morbidly obese HEENT: PERRL Respiratory system:  no wheezes or crackles  Cardiovascular system: S1 & S2 heard, RRR.  Gastrointestinal system: Abdomen is obese, has midline wound VAC, colostomy, left lower quadrant drain Central nervous system: Alert and oriented Extremities: No edema, no clubbing ,no cyanosis Skin: No rashes, no ulcers,no icterus     Data Reviewed: I have personally reviewed following labs and imaging studies  CBC: Recent Labs  Lab 07/01/24 0529 07/02/24 0323 07/05/24 0330  WBC 8.1 9.7 8.9  HGB 11.0* 10.7* 9.8*  HCT 34.3* 34.1* 31.2*  MCV 85.5 87.9 86.0   PLT 237 269 323   Basic Metabolic Panel: Recent Labs  Lab 07/01/24 0529 07/02/24 0323 07/04/24 0247 07/05/24 0330 07/06/24 0415  NA 136 141  --  135  --   K 3.4* 3.7 3.4* 3.9 3.8  CL 101 107  --  102  --   CO2 26 25  --  23  --   GLUCOSE 121* 184*  --  106*  --   BUN <5* <5*  --  7  --   CREATININE 0.82 0.79  --  0.76  --   CALCIUM 8.3* 8.3*  --  8.4*  --   MG 1.8 2.1 1.8 1.8 2.0  PHOS 2.9 3.4 3.7 3.2 3.5     No results found for this or any previous visit (from the past 240 hours).   Radiology Studies: CT ABDOMEN PELVIS W CONTRAST Result Date: 07/06/2024 CLINICAL DATA:  Postoperative abdominal pain. EXAM: CT ABDOMEN AND PELVIS WITH CONTRAST TECHNIQUE: Multidetector CT imaging of the abdomen and pelvis was performed using the standard protocol following bolus administration of intravenous contrast.  RADIATION DOSE REDUCTION: This exam was performed according to the departmental dose-optimization program which includes automated exposure control, adjustment of the mA and/or kV according to patient size and/or use of iterative reconstruction technique. CONTRAST:  75mL OMNIPAQUE  IOHEXOL  350 MG/ML SOLN COMPARISON:  July 01, 2024. FINDINGS: Lower chest: No acute abnormality. Hepatobiliary: No focal liver abnormality is seen. No gallstones, gallbladder wall thickening, or biliary dilatation. Pancreas: Unremarkable. No pancreatic ductal dilatation or surrounding inflammatory changes. Spleen: Normal in size without focal abnormality. Adrenals/Urinary Tract: Adrenal glands are unremarkable. Kidneys are normal, without renal calculi, focal lesion, or hydronephrosis. Bladder is unremarkable. Stomach/Bowel: Stomach is unremarkable. Continued presence of colostomy seen in left lower quadrant. No abnormal bowel dilatation. Stable presence of surgical drain in fluid collection around peristomal hernia. This fluid collection currently measures 7.3 x 3.6 cm which is slightly decreased compared to prior  exam. Vascular/Lymphatic: No significant vascular findings are present. No enlarged abdominal or pelvic lymph nodes. Reproductive: Prostate is unremarkable. Other: 7.7 x 7.0 cm fluid collection is noted in the pelvis which is slightly enlarged compared to prior exam and concerning for possible abscess. Musculoskeletal: No acute or significant osseous findings. IMPRESSION: Colostomy is again noted left lower quadrant. Stable presence of surgical drain in fluid collection around peristomal hernia which currently measures 7.3 x 3.6 cm which is slightly decreased compared to prior exam. 7.7 x 7.0 cm fluid collection is noted in the pelvis which is slightly enlarged compared to prior exam and concerning for possible abscess. Electronically Signed   By: Lynwood Landy Raddle M.D.   On: 07/06/2024 10:46     Scheduled Meds:  (feeding supplement) PROSource Plus  30 mL Oral BID BM   acetaminophen   1,000 mg Oral QID   amoxicillin -clavulanate  1 tablet Oral Q12H   bisacodyl   10 mg Rectal Once   Chlorhexidine  Gluconate Cloth  6 each Topical Q0600   feeding supplement  1 Container Oral Daily   gabapentin   300 mg Oral TID   ketorolac   15 mg Intravenous Q6H   methocarbamol   1,000 mg Oral QID   multivitamin with minerals  1 tablet Oral Daily   nutrition supplement (JUVEN)  1 packet Oral BID BM   pantoprazole  (PROTONIX ) IV  40 mg Intravenous QHS   polyethylene glycol  17 g Oral Daily   Continuous Infusions:     LOS: 11 days   Ivonne Mustache, MD [facility] P7/11/2024, 10:32 AM

## 2024-07-07 NOTE — TOC Progression Note (Signed)
 Transition of Care [facility]) - Progression Note    Patient Details  Name: Jordan [name] MRN: [id number] Date of Birth: [birth year]  Transition of Care [facility]) CM/SW Contact  Robynn Eileen Hoose, RN Phone Number: 07/07/2024, 2:09 PM  Clinical Narrative:   Secure message from provider regarding patient being discharged tomorrow. Glenda with [facility] made aware.    Expected Discharge Plan: Home w Home Health Services Barriers to Discharge: Continued Medical Work up  Expected Discharge Plan and Services   Discharge Planning Services: CM Consult Post Acute Care Choice: Home Health, Durable Medical Equipment Living arrangements for the past 2 months: Single Family Home                 DME Arranged: Vac DME Agency: [employer] Date DME Agency Contacted: 07/04/24 Time DME Agency Contacted: [id number] Representative spoke with at DME Agency: Mitch HH Arranged:  (see note) HH Agency: NA         Social Determinants of Health (SDOH) Interventions SDOH Screenings   Food Insecurity: Patient Declined (06/27/2024)  Housing: Patient Declined (06/27/2024)  Transportation Needs: Patient Declined (06/27/2024)  Utilities: Patient Declined (06/27/2024)  Alcohol Screen: Low Risk  (05/31/2024)  Depression (PHQ2-9): Medium Risk (05/31/2024)  Financial Resource Strain: Low Risk  (05/31/2024)  Physical Activity: Insufficiently Active (05/31/2024)  Social Connections: Moderately Isolated (05/31/2024)  Stress: Stress Concern Present (05/31/2024)  Tobacco Use: Medium Risk (06/26/2024)  Health Literacy: Adequate Health Literacy (05/31/2024)    Readmission Risk Interventions     No data to display

## 2024-07-07 NOTE — Plan of Care (Signed)
  Problem: Pain Managment: Goal: General experience of comfort will improve and/or be controlled Outcome: Progressing   Problem: Safety: Goal: Ability to remain free from injury will improve Outcome: Progressing

## 2024-07-08 DIAGNOSIS — K56609 Unspecified intestinal obstruction, unspecified as to partial versus complete obstruction: Secondary | ICD-10-CM | POA: Diagnosis not present

## 2024-07-08 LAB — GLUCOSE, CAPILLARY
Glucose-Capillary: 105 mg/dL — ABNORMAL HIGH (ref 70–99)
Glucose-Capillary: 108 mg/dL — ABNORMAL HIGH (ref 70–99)
Glucose-Capillary: 90 mg/dL (ref 70–99)

## 2024-07-08 MED ORDER — OXYCODONE HCL 10 MG PO TABS: 10.0000 mg | ORAL_TABLET | Freq: Four times a day (QID) | ORAL | 0 refills | Status: DC | PRN

## 2024-07-08 MED ORDER — GABAPENTIN 300 MG PO CAPS: 300.0000 mg | ORAL_CAPSULE | Freq: Three times a day (TID) | ORAL | 2 refills | Status: DC

## 2024-07-08 MED ORDER — METHOCARBAMOL 1000 MG PO TABS: 1000.0000 mg | ORAL_TABLET | Freq: Four times a day (QID) | ORAL | 1 refills | Status: AC

## 2024-07-08 MED ORDER — AMOXICILLIN-POT CLAVULANATE 875-125 MG PO TABS: 1.0000 | ORAL_TABLET | Freq: Two times a day (BID) | ORAL | 0 refills | Status: AC

## 2024-07-08 NOTE — Progress Notes (Signed)
 12 Days Post-Op   Subjective/Chief Complaint: Ongoing pain while getting VAC changed. Having ostomy output. Tolerating diet.  Ambulating in hall.   Vital signs in last 24 hours: Temp:  [97.4 F (36.3 C)-98.5 F (36.9 C)] 98.5 F (36.9 C) (07/13 0850) Pulse Rate:  [88-118] 103 (07/13 0850) Resp:  [17-18] 17 (07/13 0850) BP: (122-158)/(76-94) 144/77 (07/13 0850) SpO2:  [99 %-100 %] 99 % (07/13 0850) Last BM Date : 07/07/24  Intake/Output from previous day: 07/12 0701 - 07/13 0700 In: 650 [P.O.:650] Out: 910 [Urine:450; Drains:460] Intake/Output this shift: No intake/output data recorded.  Gen: NAD CV: RRR Pulm: NWOB on room air Abd: Soft, mild tenderness to palpation, L flank with mild edema vs hematoma - no cellulitis. drain with minimal output, SS  Stoma viable with non-bloody semi-solid stool in pouch  Midline wound with beefy granulation tissue    Lab Results:  No results for input(s): WBC, HGB, HCT, PLT in the last 72 hours.  BMET Recent Labs    07/06/24 0415  K 3.8   PT/INR No results for input(s): LABPROT, INR in the last 72 hours. ABG No results for input(s): PHART, HCO3 in the last 72 hours.  Invalid input(s): PCO2, PO2  Studies/Results: No results found.    Anti-infectives: Anti-infectives (From admission, onward)    Start     Dose/Rate Route Frequency Ordered Stop   07/07/24 1115  amoxicillin -clavulanate (AUGMENTIN ) 875-125 MG per tablet 1 tablet        1 tablet Oral Every 12 hours 07/07/24 1027 07/17/24 0959   07/02/24 1000  piperacillin -tazobactam (ZOSYN ) IVPB 3.375 g  Status:  Discontinued        3.375 g 12.5 mL/hr over 240 Minutes Intravenous Every 8 hours 07/02/24 0952 07/07/24 1027   06/27/24 0200  piperacillin -tazobactam (ZOSYN ) IVPB 3.375 g        3.375 g 12.5 mL/hr over 240 Minutes Intravenous Every 8 hours 06/27/24 0113 07/02/24 0715   06/26/24 2130  piperacillin -tazobactam (ZOSYN ) IVPB 3.375 g  Status:   Discontinued        3.375 g 12.5 mL/hr over 240 Minutes Intravenous Every 8 hours 06/26/24 2117 06/27/24 0113   06/26/24 0830  cefoTEtan  (CEFOTAN ) 2 g in sodium chloride  0.9 % 100 mL IVPB        2 g 200 mL/hr over 30 Minutes Intravenous On call to O.R. 06/26/24 0820 06/26/24 1335       Assessment/Plan: s/p Procedure(s) with comments: LAPAROTOMY, EXPLORATORY (N/A) - Lysis of adhesions, small bowel resection, repair of paristomal hernia, revision of decending colostomy on 06/26/24 with Dr. Curvin POD#12, afebrile, WBC WNL -  CT 7/6 w/ PO contrast shows contrast to the level of the colostomy. Pelvic fluid collection with hyperdense material and small amt gas. Differential: abscess, hematoma, or, less likely, small anastomotic leak from small bowel anastomosis. IR consulted and unable to drain.  - repeat CT 7/11 with persistent pelvic collection - discussed with IR and still unable to drain - Cont PO abx - reg diet - VAC changes T/F, may need it changed today due to leaking - continue scheduled tylenol  and robaxin . PRN oxy to q4h prn   FEN: reg, ensure ID: [email or web address]  for pelvic fluid collection> Augmentin ; UA from 7/6 negative Foley: none VTE: SCD's, lovenox   Dispo: Med-surg, ok for d/c, f/u with Dr Curvin in the office  [facility] VAC ordered     LOS: 12 days   Bernarda JAYSON Ned, MD  Colorectal and General Surgery [facility]

## 2024-07-08 NOTE — Progress Notes (Signed)
 JP drain and suture around drain removed. Pt tolerated well. Site dressed with dry gauze and tape.

## 2024-07-08 NOTE — Progress Notes (Signed)
 Abdominal wound vac dressing changed by RN Breck GAILS., assisted by this RN and patients wife present at the bedside. Medela home vac connected to wound vac without any issues. Patient tolerated procedure well.

## 2024-07-08 NOTE — Discharge Summary (Signed)
 Physician Discharge Summary  Jordan [name] FMW:[id number] DOB: June 13, 1981 DOA: 06/26/2024  PCP: [name], MD  Admit date: 06/26/2024 Discharge date: 07/08/2024  Admitted From: Home Disposition:  Home  Discharge Condition:Stable CODE STATUS:FULL Diet recommendation: regular  Brief/Interim Summary: Patient is a 43 year old male with history of diverticulitis complicated by incarcerated hernia status post colostomy in 2021, OSA not on CPAP, hypertension, morbid obesity, prediabetes who presented with left-sided abdominal pain after lifting heavy case of water, with associated  nausea and vomiting.  Imaging showed small bowel obstruction with possible incarceration and strangulation in the setting of large parastomal hernia containing sigmoid colon and small bowel.  Lactic acid was elevated on presentation.  General surgery consulted.  Taken to the OR and had expiratory laparotomy with LOA, small bowel resection, repair of parastomal hernia and revision of descending colostomy by Dr. Curvin on 7/1.  Started on PCA for pain control.  General surgery  was closely following.  He has significantly improved clinically.  Abdomen pain is well-controlled.  No signs of sepsis.  Tolerating solid diet.  General surgery cleared for discharge with oral antibiotics.  He will follow-up with general surgery as an outpatient   Following problems were addressed during the hospitalization:  Small bowel obstruction: Presented with sudden onset of lower abdominal pain with nausea and vomiting. Imaging showed small bowel obstruction with possible incarceration and strangulation in the setting of large parastomal hernia containing sigmoid colon and small bowel.  Lactic acid was elevated on presentation.  General surgery consulted.  Taken to the OR and had expiratory laparotomy with LOA, small bowel resection, repair of parastomal hernia and revision of descending colostomy by Dr. Curvin on 7/1. Follow-up CT  abdomen/pelvis on 7/6 showed fluid collection near anastomosis, body wall/flank edema.  No fever or leukocytosis.  Currently on IV Zosyn .  IR was consulted to check if he is a candidate for IR drainage but as per IR, intra-abdominal collection is not amenable to drainage due to location and surrounding structures commended conservative management.  CT abd/pelvis follow up on 7/11  showed   presence of surgical drain in fluid collection around peristomal hernia with size  slightly decreased compared to prior exam,7.7 x 7.0 cm fluid collection in the pelvis which is slightly enlarged compared to prior exam and concerning for possible abscess.  General surgery closely following.  IR again reconsulted but IR says this is not an amenable for drainage. Patient tolerating solid diet.  Able to go home.  Antibiotics sent to oral   Hypertension/sinus tachycardia: Discussed this with patient.  He thinks that this is from his abdominal discomfort.  He does not want to start any medication for now.  Counseled him to monitor his blood pressure while at home and follow-up with his PCP.    AKI: Resolved with IV fluid   Hyperglycemia:recent  A1c 5.6.   OSA: Not on CPAP.  On supplemental oxygen nightly.   Leukocytosis: Resolved   Morbid obesity: BMI of 44.8   Discharge Diagnoses:  Principal Problem:   SBO (small bowel obstruction) (HCC) Active Problems:   Hypertensive urgency   Increased anion gap metabolic acidosis   Prediabetes    Discharge Instructions  Discharge Instructions      Remove dressing in 72 hours   Complete by: As directed    Diet - low sodium heart healthy   Complete by: As directed    Discharge instructions   Complete by: As directed    1)Please take prescribed  medications as instructed 2)Follow up with general surgery as an outpatient.You will be called for appointment 3)Monitor blood pressure at home.  Follow-up with health   Increase activity slowly   Complete by: As  directed       Allergies as of 07/08/2024       Reactions   Butrans [buprenorphine Hcl] Shortness Of Breath, Nausea And Vomiting   Nucynta [tapentadol Hydrochloride] Anaphylaxis   Glucose Polymer Other (See Comments)   Poison   [place] Other (See Comments)   Poison   Bactrim  [sulfamethoxazole -trimethoprim ] Itching, Rash        Medication List     TAKE these medications    albuterol  108 (90 Base) MCG/ACT inhaler Commonly known as: VENTOLIN  HFA Inhale 2 puffs into the lungs every 6 (six) hours as needed for wheezing or shortness of breath.   amoxicillin -clavulanate 875-125 MG tablet Commonly known as: AUGMENTIN  Take 1 tablet by mouth every 12 (twelve) hours for 14 days.   gabapentin  300 MG capsule Commonly known as: NEURONTIN  Take 1 capsule (300 mg total) by mouth 3 (three) times daily.   ibuprofen  200 MG tablet Commonly known as: ADVIL  Take 800 mg by mouth 2 (two) times daily as needed for moderate pain (pain score 4-6).   Methocarbamol  1000 MG Tabs Take 1,000 mg by mouth 4 (four) times daily for 14 days.   omeprazole 20 MG tablet Commonly known as: PRILOSEC OTC Take 20 mg by mouth daily as needed (heartburn).   Oxycodone  HCl 10 MG Tabs Take 1-1.5 tablets (10-15 mg total) by mouth every 6 (six) hours as needed for moderate pain (pain score 4-6) or severe pain (pain score 7-10) (10 mg for moderate, 15 mg for severe).   polyethylene glycol 17 g packet Commonly known as: MIRALAX  / GLYCOLAX  Take 17 g by mouth daily as needed for mild constipation.        Follow-up Information     [facility] Follow up.   Specialty: Home Health Services Why: local number [phone] Contact information: [address] [place] [address] [phone]         [facility] Follow up.   Why: Company negative wound pressure system came from Contact information: [address] [place] [address] [phone]          Curvin Deward MOULD, MD Follow up.   Specialty: General Surgery Why: our office is scheduling you for post-operative follow up in about 3 weeks. Call to confirm appointment date/time. Contact information: [address] [place] [address] [phone]                Allergies  Allergen Reactions   Butrans [Buprenorphine Hcl] Shortness Of Breath and Nausea And Vomiting   Nucynta [Tapentadol Hydrochloride] Anaphylaxis   Glucose Polymer Other (See Comments)    Poison   [place] Other (See Comments)    Poison   Bactrim  [Sulfamethoxazole -Trimethoprim ] Itching and Rash    Consultations:    Procedures/Studies: CT ABDOMEN PELVIS W CONTRAST Result Date: 07/06/2024 CLINICAL DATA:  Postoperative abdominal pain. EXAM: CT ABDOMEN AND PELVIS WITH CONTRAST TECHNIQUE: Multidetector CT imaging of the abdomen and pelvis was performed using the standard protocol following bolus administration of intravenous contrast. RADIATION DOSE REDUCTION: This exam was performed according to the departmental dose-optimization program which includes automated exposure control, adjustment of the mA and/or kV according to patient size and/or use of iterative reconstruction technique. CONTRAST:  75mL OMNIPAQUE   IOHEXOL  350 MG/ML SOLN COMPARISON:  July 01, 2024. FINDINGS: Lower chest: No acute abnormality. Hepatobiliary: No focal liver abnormality is seen. No gallstones, gallbladder wall thickening, or biliary dilatation. Pancreas: Unremarkable. No pancreatic ductal dilatation or surrounding inflammatory changes. Spleen: Normal in size without focal abnormality. Adrenals/Urinary Tract: Adrenal glands are unremarkable. Kidneys are normal, without renal calculi, focal lesion, or hydronephrosis. Bladder is unremarkable. Stomach/Bowel: Stomach is unremarkable. Continued presence of colostomy seen in left lower quadrant. No abnormal bowel dilatation. Stable presence of surgical drain in fluid collection  around peristomal hernia. This fluid collection currently measures 7.3 x 3.6 cm which is slightly decreased compared to prior exam. Vascular/Lymphatic: No significant vascular findings are present. No enlarged abdominal or pelvic lymph nodes. Reproductive: Prostate is unremarkable. Other: 7.7 x 7.0 cm fluid collection is noted in the pelvis which is slightly enlarged compared to prior exam and concerning for possible abscess. Musculoskeletal: No acute or significant osseous findings. IMPRESSION: Colostomy is again noted left lower quadrant. Stable presence of surgical drain in fluid collection around peristomal hernia which currently measures 7.3 x 3.6 cm which is slightly decreased compared to prior exam. 7.7 x 7.0 cm fluid collection is noted in the pelvis which is slightly enlarged compared to prior exam and concerning for possible abscess. Electronically Signed   By: Lynwood Landy Raddle M.D.   On: 07/06/2024 10:46   CT ABDOMEN PELVIS WO CONTRAST Result Date: 07/01/2024 CLINICAL DATA:  Postop abdomen pain EXAM: CT ABDOMEN AND PELVIS WITHOUT CONTRAST TECHNIQUE: Multidetector CT imaging of the abdomen and pelvis was performed following the standard protocol without IV contrast. RADIATION DOSE REDUCTION: This exam was performed according to the departmental dose-optimization program which includes automated exposure control, adjustment of the mA and/or kV according to patient size and/or use of iterative reconstruction technique. COMPARISON:  CT 07/01/2024, 06/26/2024, 11/06/2023 FINDINGS: Lower chest: Lung bases demonstrate mild dependent atelectasis. Gynecomastia. Hepatobiliary: Probable vicarious excretion of contrast in the gallbladder. No biliary dilatation. Pancreas: Unremarkable. No pancreatic ductal dilatation or surrounding inflammatory changes. Spleen: Normal in size without focal abnormality. Adrenals/Urinary Tract: Adrenal glands are normal. Kidneys show no hydronephrosis. The bladder is unremarkable  Stomach/Bowel: Small volume enteral contrast in the stomach. Enteral contrast within small and large bowel. Fluid distension of small bowel loops measuring up to 4.8 cm. Left abdominal colostomy, small amount of contrast has reached the colostomy bag. Rectal pouch containing small amount of high density material, unchanged. Postsurgical changes of the small bowel with staple line in the left pelvis. Mild areas of small-bowel wall thickening. Redemonstrated triangular shaped fluid collection in the central pelvis, measures approximately 6.8 x 6 cm on series 3, image 73. Small amount of hyperdense material in the collection dependently, series 3, image 74. Overall density value not consistent with simple fluid. Density value measures slightly higher than on the CT performed earlier today when there was no enteral contrast present. Small gas bubbles within the fluid collection and adjacent to the suture line. Persistent gas and soft tissue stranding adjacent to the left abdominal colostomy, likely postoperative. Drainage catheter with tip in the subcutaneous soft tissues. Slightly dense collection in the subcutaneous soft tissues of the left abdominal wall, partially surrounds the drainage catheter, this collection measures 7.9 x 3.9 cm and is suspect for a hematoma. Vascular/Lymphatic: Aortic atherosclerosis. No enlarged abdominal or pelvic lymph nodes. Reproductive: Hysterectomy.  No suspicious adnexal mass Other: Small volume free fluid in the pelvis. Generalized subcutaneous edema. Soft tissue stranding within the mesentery, could be  due to resolving postoperative change. Musculoskeletal: No acute or suspicious osseous abnormality IMPRESSION: 1. Postop changes corresponding to history of repair of small bowel resection, left abdominal parastomal hernia and revision of distal colostomy. Interval administration of enteral contrast. Some dilated loops of small bowel but contrast has reached the ostomy bag and findings  favor postoperative ileus. 2. 6.8 cm triangular shaped fluid collection in the central pelvis, small amount of hyperdense material in the collection dependently which was present on the CT performed earlier today. Small gas bubbles within the fluid collection and adjacent to the small bowel suture line. Fluid collection in the pelvis measures slightly more dense compared to the examination performed earlier today without contrast. Differential considerations include postoperative fluid collection or possible hematoma with small foci of postoperative gas in the collection and adjacent to the anastomosis. Given slight change in density value of the fluid collection compared to the exam earlier today, postoperative leak at the small bowel suture line in the pelvis is also a consideration. 3. 7.9 cm slightly dense collection in the subcutaneous soft tissues of the left abdominal wall, partially surrounds the drainage catheter, suspect for a hematoma. 4. Small volume free fluid in the pelvis. Generalized subcutaneous edema. 5. Aortic atherosclerosis. Aortic Atherosclerosis (ICD10-I70.0). Electronically Signed   By: Luke Bun M.D.   On: 07/01/2024 15:51   US  EKG SITE RITE Result Date: 07/01/2024 If Site Rite image not attached, placement could not be confirmed due to current cardiac rhythm.  CT ABDOMEN PELVIS W CONTRAST Result Date: 07/01/2024 CLINICAL DATA:  43 year old male with abdominal pain. Abnormal sigmoid ostomy with large parastomal hernia on CT Abdomen and Pelvis 5 days ago. . Now ostoperative day 5 status post laparotomy with lysis of adhesions, small-bowel resection, repair of parastomal hernia, revision of colostomy. EXAM: CT ABDOMEN AND PELVIS WITH CONTRAST TECHNIQUE: Multidetector CT imaging of the abdomen and pelvis was performed using the standard protocol following bolus administration of intravenous contrast. RADIATION DOSE REDUCTION: This exam was performed according to the departmental  dose-optimization program which includes automated exposure control, adjustment of the mA and/or kV according to patient size and/or use of iterative reconstruction technique. CONTRAST:  OMNIPAQUE  IOHEXOL  350 MG/ML SOLN COMPARISON:  CT Abdomen and Pelvis 06/26/2024 and earlier. FINDINGS: Lower chest: Gynecomastia. Heart size remains normal. No pericardial or pleural effusion. Mild lung base atelectasis. Hepatobiliary: Probable vicarious contrast excretion to the gallbladder. Layering sludge or small stones less likely. No pericholecystic inflammation. Liver enhancement within normal limits. No bile duct enlargement. Pancreas: Stable partial atrophy. Spleen: Negative. Adrenals/Urinary Tract: Normal adrenal glands. Nonobstructed kidneys, symmetric renal enhancement and early contrast excretion. Diminutive ureters. Diminutive but thick-walled urinary bladder. Stomach/Bowel: New postoperative changes to the ventral abdominal wall with a midline wound healing by secondary intention. Postoperative changes to the left abdominal wall, site of previous parastomal hernia. Descending colostomy loop there now is nondilated. Regional postoperative appearing subcutaneous gas. A percutaneous operative drain is in place and terminates at an amorphous mixed density collection which appears to be hematoma, confluent portion of which is 39 x 82 x 38 mm (AP by transverse by CC), 60 mL (coronal image 152 and series 3, image 53. Blind-ending rectum within the pelvis appears unchanged. Staple line associated with mildly dilated air and fluid containing small bowel loops in the lower abdomen (series 3, image 74). And tracking from the left lower quadrant staple line into the deep pelvis is partially organized collection of mesenteric gas and fluid. Small volume mesenteric gas  demonstrated on coronal images 104 and 113. Adjacent fluid and air containing triangular fluid collection occupying the central pelvic inlet on series 3 image  77 is 75 x 61 x 54 mm (AP by transverse by CC), 120 mL. Surrounding mesenteric inflammation. No free intraperitoneal air. Proximal jejunum decompressed. Terminal ileum distal to the anastomosis is decompressed. Small bowel loops upstream of the anastomosis are mildly dilated. Air in fluid mildly distending the large bowel to the ostomy. Mild free fluid in the left gutter. Vascular/Lymphatic: Major arterial structures, portal venous system appear patent. Suboptimal intravascular contrast. Reactive appearing pelvic inlet lymph nodes, individually up to 10 mm short axis and only mildly increased from the preoperative CT. Reproductive: Negative. Other: No free fluid in the pelvis. Musculoskeletal: No acute osseous abnormality identified. Increased bilateral body wall and flank edema. IMPRESSION: 1. Interval postoperative changes from laparotomy, repair of parastomal hernia, and revision of distal colostomy. 2. Positive for 120 mL organizing air and fluid collection at the pelvic inlet, the distal small bowel mesentery and adjacent to distal small bowel anastomotic staple line (series 3, image 77). Anastomotic Leak or developing postop Abscess not excluded. Upstream mild small-bowel obstruction or ileus. Terminal ileum distal to the anastomosis is decompressed. 3. Smaller 60 mL hematoma/seroma at the left ventral abdominal wall operative site from #1, with postoperative drain in place. 4. Air in fluid throughout small and large bowel loops compatible with a degree of Enteritis, Diarrhea. 5. Increased body wall and flank edema. Electronically Signed   By: VEAR Hurst M.D.   On: 07/01/2024 04:43   CT ABDOMEN PELVIS W CONTRAST Result Date: 06/26/2024 CLINICAL DATA:  Severe abdominal pain nausea and vomiting. History of small-bowel obstruction. EXAM: CT ABDOMEN AND PELVIS WITH CONTRAST TECHNIQUE: Multidetector CT imaging of the abdomen and pelvis was performed using the standard protocol following bolus administration of  intravenous contrast. RADIATION DOSE REDUCTION: This exam was performed according to the departmental dose-optimization program which includes automated exposure control, adjustment of the mA and/or kV according to patient size and/or use of iterative reconstruction technique. CONTRAST:  OMNIPAQUE  IOHEXOL  350 MG/ML SOLN COMPARISON:  11/06/2023 FINDINGS: Lower chest: Unremarkable Hepatobiliary: No suspicious focal abnormality within the liver parenchyma. There is no evidence for gallstones, gallbladder wall thickening, or pericholecystic fluid. No intrahepatic or extrahepatic biliary dilation. Pancreas: No focal mass lesion. No dilatation of the main duct. No intraparenchymal cyst. No peripancreatic edema. Spleen: No splenomegaly. No suspicious focal mass lesion. Adrenals/Urinary Tract: No adrenal nodule or mass. Kidneys unremarkable. No evidence for hydroureter. The urinary bladder appears normal for the degree of distention. Stomach/Bowel: Stomach is unremarkable. No gastric wall thickening. No evidence of outlet obstruction. Duodenum is normally positioned as is the ligament of Treitz. No small bowel wall thickening. No small bowel dilatation. Cecum is low in the left pelvis. The terminal ileum is normal. The appendix is normal. Right and transverse colon unremarkable. 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 Patient has a relatively large parastomal hernia in the left abdomen containing sigmoid colon and small bowel. Small bowel loops in the hernia sac are dilated in fluid-filled up to 4 cm diameter. Gas between the enteric contents in the mucosal surface is evident (see coronal 105/6) which may be intraluminal gas trapped along the mucosal surface but a degree of pneumatosis is not excluded. There are gas collections in the hernia sac that cannot be definitely confirmed to be intraluminal although they may reflect markedly decompressed small bowel loops or diverticuli associated  with the herniated portion of the sigmoid colon. As an example of possible extraluminal gas, see axial image 46/3 and corresponding sagittal image 57/7. There is edema in fluid in the hernia sac. Vascular/Lymphatic: There is mild atherosclerotic calcification of the abdominal aorta without aneurysm. There is no gastrohepatic or hepatoduodenal ligament lymphadenopathy. No retroperitoneal or mesenteric lymphadenopathy. No pelvic sidewall lymphadenopathy. Reproductive: The prostate gland and seminal vesicles are unremarkable. Other: No intraperitoneal free fluid. Musculoskeletal: No worrisome lytic or sclerotic osseous abnormality. IMPRESSION: 1. Left abdominal sigmoid end colostomy with a relatively large parastomal hernia in the left abdomen containing sigmoid colon and small bowel. Small bowel loops in the hernia sac are dilated and fluid-filled up to 4 cm diameter. Gas between the enteric contents in the mucosal surface is evident which may be intraluminal gas trapped along the mucosal surface but a degree of pneumatosis is not excluded. There are gas collections in the hernia sac that cannot be definitely confirmed to be intraluminal and while they may reflect markedly decompressed small bowel loops or diverticuli associated with the herniated portion of the sigmoid colon, small volume extraluminal gas is a concern. Imaging features are concerning for small bowel obstruction with possible incarceration and possible strangulation. Descending colon leading into the parastomal hernia is nondilated suggesting no associated colonic obstruction. Surgical consultation recommended. 2.  Aortic Atherosclerosis (ICD10-I70.0). Electronically Signed   By: Camellia Candle M.D.   On: 06/26/2024 05:40      Subjective: Patient seen and examined at bedside today.  Hemodynamically stable.  Comfortable.  Sitting on the chair.  Eager to go home.  Tolerating solid diet.  No nausea or vomiting.  Abdomen pain  well-controlled  Discharge Exam: Vitals:   07/08/24 0444 07/08/24 0850  BP: (!) 158/90 (!) 144/77  Pulse: (!) 115 (!) 103  Resp: 17 17  Temp: 98.4 F (36.9 C) 98.5 F (36.9 C)  SpO2: 100% 99%   Vitals:   07/07/24 2051 07/08/24 0036 07/08/24 0444 07/08/24 0850  BP: (!) 146/82 136/82 (!) 158/90 (!) 144/77  Pulse: (!) 115 (!) 108 (!) 115 (!) 103  Resp: 18 18 17 17   Temp: 98.4 F (36.9 C) 98.2 F (36.8 C) 98.4 F (36.9 C) 98.5 F (36.9 C)  TempSrc: Oral Oral Oral Oral  SpO2: 100% 100% 100% 99%  Weight:      Height:        General: Pt is alert, awake, not in acute distress Cardiovascular: RRR, S1/S2 +, no rubs, no gallops Respiratory: CTA bilaterally, no wheezing, no rhonchi Abdominal: Soft, NT, ND, bowel sounds +, midline wound VAC, left lower quadrant JP drain, colostomy Extremities: no edema, no cyanosis    The results of significant diagnostics from this hospitalization (including imaging, microbiology, ancillary and laboratory) are listed below for reference.     Microbiology: No results found for this or any previous visit (from the past 240 hours).   Labs: BNP (last 3 results) No results for input(s): BNP in the last 8760 hours. Basic Metabolic Panel: Recent Labs  Lab 07/02/24 0323 07/04/24 0247 07/05/24 0330 07/06/24 0415  NA 141  --  135  --   K 3.7 3.4* 3.9 3.8  CL 107  --  102  --   CO2 25  --  23  --   GLUCOSE 184*  --  106*  --   BUN <5*  --  7  --   CREATININE 0.79  --  0.76  --   CALCIUM 8.3*  --  8.4*  --   MG 2.1 1.8 1.8 2.0  PHOS 3.4 3.7 3.2 3.5   Liver Function Tests: Recent Labs  Lab 07/02/24 0323  ALBUMIN  2.3*   No results for input(s): LIPASE, AMYLASE in the last 168 hours. No results for input(s): AMMONIA in the last 168 hours. CBC: Recent Labs  Lab 07/02/24 0323 07/05/24 0330  WBC 9.7 8.9  HGB 10.7* 9.8*  HCT 34.1* 31.2*  MCV 87.9 86.0  PLT 269 323   Cardiac Enzymes: No results for input(s): CKTOTAL,  CKMB, CKMBINDEX, TROPONINI in the last 168 hours. BNP: Invalid input(s): POCBNP CBG: Recent Labs  Lab 07/07/24 1622 07/07/24 1939 07/08/24 0013 07/08/24 0443 07/08/24 0751  GLUCAP 117* 112* 105* 108* 90   D-Dimer No results for input(s): DDIMER in the last 72 hours. Hgb A1c No results for input(s): HGBA1C in the last 72 hours. Lipid Profile No results for input(s): CHOL, HDL, LDLCALC, TRIG, CHOLHDL, LDLDIRECT in the last 72 hours. Thyroid  function studies No results for input(s): TSH, T4TOTAL, T3FREE, THYROIDAB in the last 72 hours.  Invalid input(s): FREET3 Anemia work up No results for input(s): VITAMINB12, FOLATE, FERRITIN, TIBC, IRON, RETICCTPCT in the last 72 hours. Urinalysis    Component Value Date/Time   COLORURINE STRAW (A) 07/01/2024 1209   APPEARANCEUR CLEAR 07/01/2024 1209   APPEARANCEUR Hazy (A) 09/12/2020 1058   LABSPEC 1.005 07/01/2024 1209   PHURINE 8.0 07/01/2024 1209   GLUCOSEU NEGATIVE 07/01/2024 1209   HGBUR NEGATIVE 07/01/2024 1209   BILIRUBINUR NEGATIVE 07/01/2024 1209   BILIRUBINUR Negative 08/27/2020 1332   KETONESUR NEGATIVE 07/01/2024 1209   PROTEINUR NEGATIVE 07/01/2024 1209   UROBILINOGEN 0.2 05/04/2020 1400   NITRITE NEGATIVE 07/01/2024 1209   LEUKOCYTESUR NEGATIVE 07/01/2024 1209   Sepsis Labs Recent Labs  Lab 07/02/24 0323 07/05/24 0330  WBC 9.7 8.9   Microbiology No results found for this or any previous visit (from the past 240 hours).  Please note: You were cared for by a hospitalist during your hospital stay. Once you are discharged, your primary care physician will handle any further medical issues. Please note that NO REFILLS for any discharge medications will be authorized once you are discharged, as it is imperative that you return to your primary care physician (or establish a relationship with a primary care physician if you do not have one) for your post hospital discharge  needs so that they can reassess your need for medications and monitor your lab values.    Time coordinating discharge: 40 minutes  SIGNED:   Ivonne Mustache, MD  [facility] 07/08/2024, 9:49 AM Pager [phone]  If 7PM-7AM, please contact night-coverage [email or web address] Password [email or web address]

## 2024-07-08 NOTE — TOC Transition Note (Signed)
 Transition of Care [facility]) - Discharge Note   Patient Details  Name: Jordan [name] MRN: [id number] Date of Birth: May 09, 1981  Transition of Care [facility]) CM/SW Contact:  Robynn Eileen Hoose, RN Phone Number: 07/08/2024, 10:45 AM   Clinical Narrative:   Patient is being discharged today. Glenda with [facility] made aware.    Final next level of care: Home w Home Health Services Barriers to Discharge: No Barriers Identified   Patient Goals and CMS Choice Patient states their goals for this hospitalization and ongoing recovery are:: to return home CMS Medicare.gov Compare Post Acute Care list provided to:: Patient Choice offered to / list presented to : Patient      Discharge Placement                       Discharge Plan and Services Additional resources added to the After Visit Summary for     Discharge Planning Services: CM Consult Post Acute Care Choice: Home Health, Durable Medical Equipment          DME Arranged: Vac DME Agency: [employer] Date DME Agency Contacted: 07/04/24 Time DME Agency Contacted: 1003 Representative spoke with at DME Agency: Mitch HH Arranged:  (see note) HH Agency: NA        Social Drivers of Health (SDOH) Interventions SDOH Screenings   Food Insecurity: Patient Declined (06/27/2024)  Housing: Patient Declined (06/27/2024)  Transportation Needs: Patient Declined (06/27/2024)  Utilities: Patient Declined (06/27/2024)  Alcohol Screen: Low Risk  (05/31/2024)  Depression (PHQ2-9): Medium Risk (05/31/2024)  Financial Resource Strain: Low Risk  (05/31/2024)  Physical Activity: Insufficiently Active (05/31/2024)  Social Connections: Moderately Isolated (05/31/2024)  Stress: Stress Concern Present (05/31/2024)  Tobacco Use: Medium Risk (06/26/2024)  Health Literacy: Adequate Health Literacy (05/31/2024)     Readmission Risk Interventions     No data to display

## 2024-07-08 NOTE — Progress Notes (Signed)
 CVAD removed per protocol per MD order. Manual pressure applied for 3 mins. Vaseline gauze, gauze, and Tegaderm applied over insertion site. No bleeding or swelling noted. Instructed patient to remain in bed for thirty mins. Educated patient about S/S of infection and when to call MD; no heavy lifting or pressure on R side for 24 hours; keep dressing dry and intact for 24 hours. Pt verbalized comprehension.

## 2024-07-08 NOTE — Progress Notes (Addendum)
 DISCHARGE NOTE HOME Jordan [name] to be discharged Home per MD order. Discussed prescriptions and follow up appointments with the patient. Prescriptions given to patient; medication list explained in detail. Patient verbalized understanding.  Skin clean, dry and intact without evidence of skin break down, no evidence of skin tears noted. IV catheter discontinued intact. Site without signs and symptoms of complications. Dressing and pressure applied. Pt denies pain at the site currently. No complaints noted.  See LDA fpr ostomy negatrive therapy and wounds.  Patient free of otherlines, drains, and wounds.   An After Visit Summary (AVS) was printed and given to the patient. Waiting on [place] supplies and then will discharge Jordan SHAUNNA Pepper, RN

## 2024-07-08 NOTE — Plan of Care (Signed)
  Problem: Education: Goal: Knowledge of General Education information will improve Description: Including pain rating scale, medication(s)/side effects and non-pharmacologic comfort measures Outcome: Progressing   Problem: Health Behavior/Discharge Planning: Goal: Ability to manage health-related needs will improve Outcome: Progressing   Problem: Activity: Goal: Risk for activity intolerance will decrease Outcome: Progressing   Problem: Elimination: Goal: Will not experience complications related to bowel motility Outcome: Progressing   Problem: Safety: Goal: Ability to remain free from injury will improve Outcome: Progressing   Problem: Skin Integrity: Goal: Risk for impaired skin integrity will decrease Outcome: Progressing   

## 2024-07-09 ENCOUNTER — Telehealth: Payer: Self-pay | Admitting: *Deleted

## 2024-07-09 NOTE — Transitions of Care (Post Inpatient/ED Visit) (Signed)
 07/09/2024  Name: Jordan [name] MRN: [id number] DOB: 1981-01-05  Today's TOC FU Call Status: Today's TOC FU Call Status:: Successful TOC FU Call Completed TOC FU Call Complete Date: 07/09/24 Patient's Name and Date of Birth confirmed.  Transition Care Management Follow-up Telephone Call Date of Discharge: 07/08/24 Discharge Facility: [facility]) Type of Discharge: Inpatient Admission Primary Inpatient Discharge Diagnosis:: SBO (small bowel obstruction How have you been since you were released from the hospital?: Same Any questions or concerns?: No  Items Reviewed: Did you receive and understand the discharge instructions provided?: Yes Medications obtained,verified, and reconciled?: Yes (Medications Reviewed) Any new allergies since your discharge?: No Dietary orders reviewed?: Yes Type of Diet Ordered:: low sodium heart healthy Do you have support at home?: Yes People in Home [RPT]: spouse Name of Support/Comfort Primary Source: [name]  Medications Reviewed Today: Medications Reviewed Today     Reviewed by Lucky Andrea LABOR, RN (Registered Nurse) on 07/09/24 at 1216  Med List Status: <None>   Medication Order Taking? Sig Documenting Provider Last Dose Status Informant  albuterol  (VENTOLIN  HFA) 108 (90 Base) MCG/ACT inhaler [id number] Yes Inhale 2 puffs into the lungs every 6 (six) hours as needed for wheezing or shortness of breath. Wallace Joesph LABOR, PA  Active Self, Spouse/Significant Other, Pharmacy Records  amoxicillin -clavulanate (AUGMENTIN ) 875-125 MG tablet [id number] Yes Take 1 tablet by mouth every 12 (twelve) hours for 14 days. Debby Hila, MD  Active   gabapentin  (NEURONTIN ) 300 MG capsule [id number] Yes Take 1 capsule (300 mg total) by mouth 3 (three) times daily. Debby Hila, MD  Active   ibuprofen  (ADVIL ) 200 MG tablet [id number] Yes Take 800 mg by mouth 2 (two) times daily as needed for moderate pain (pain score 4-6). [provider]  Active Self, Spouse/Significant Other, Pharmacy Records  methocarbamol  1000 MG TABS [id number] Yes Take 1,000 mg by mouth 4 (four) times daily for 14 days. Debby Hila, MD  Active   omeprazole (PRILOSEC OTC) 20 MG tablet [id number] Yes Take 20 mg by mouth daily as needed (heartburn). [provider]  Active Self, Spouse/Significant Other, Pharmacy Records  oxyCODONE  10 MG TABS [id number] Yes Take 1-1.5 tablets (10-15 mg total) by mouth every 6 (six) hours as needed for moderate pain (pain score 4-6) or severe pain (pain score 7-10) (10 mg for moderate, 15 mg for severe). Thomas, Alicia, MD  Active   polyethylene glycol (MIRALAX  / GLYCOLAX ) 17 g packet [id number] Yes Take 17 g by mouth daily as needed for mild constipation. Debby Hila, MD  Active             Home Care and Equipment/Supplies: Were Home Health Services Ordered?: Yes Name of Home Health Agency:: [facility] Has Agency set up a time to come to your home?: No (specific time for first visit has not been arranged at this time) First Home Health Visit Date: 07/10/24 EMR reviewed for Home Health Orders: Orders present/patient has not received call (refer to CM for follow-up) (RNCM placed call to [facility], left message to verify first visit will be on 07/10/24 for wound vac dressing change) Any new equipment or medical supplies ordered?: Yes Name of Medical supply agency?: [facility] Were you able to get the equipment/medical supplies?: Yes Do you have any questions related to the use of the equipment/supplies?: Yes What questions do you have?: Clogged, patient changed canister and is working appropriatley at this time  Functional Questionnaire: Do you need assistance with bathing/showering  or dressing?: Yes (wife assist) Do you need assistance with meal preparation?: Yes (wife assists) Do you need assistance with eating?: No Do you have difficulty maintaining continence: No Do you need  assistance with getting out of bed/getting out of a chair/moving?: Yes Do you have difficulty managing or taking your medications?: No  Follow up appointments reviewed: PCP Follow-up appointment confirmed?: Yes Date of PCP follow-up appointment?: 07/19/24 Follow-up Provider: Dr. Flynn Specialist [facility] Follow-up appointment confirmed?: No Reason Specialist Follow-Up Not Confirmed: Patient has Specialist Provider Number and will Call for Appointment Do you need transportation to your follow-up appointment?: No Do you understand care options if your condition(s) worsen?: Yes-patient verbalized understanding  SDOH Interventions Today    Flowsheet Row Most Recent Value  SDOH Interventions   Food Insecurity Interventions Patient Declined  Housing Interventions Intervention Not Indicated  Transportation Interventions Intervention Not Indicated  Utilities Interventions Intervention Not Indicated    Andrea Dimes RN, BSN [place]  [facility] RN Care Manager [phone]

## 2024-07-11 DIAGNOSIS — F419 Anxiety disorder, unspecified: Secondary | ICD-10-CM | POA: Diagnosis not present

## 2024-07-11 DIAGNOSIS — Z48815 Encounter for surgical aftercare following surgery on the digestive system: Secondary | ICD-10-CM | POA: Diagnosis not present

## 2024-07-11 DIAGNOSIS — F32A Depression, unspecified: Secondary | ICD-10-CM | POA: Diagnosis not present

## 2024-07-11 DIAGNOSIS — Z433 Encounter for attention to colostomy: Secondary | ICD-10-CM | POA: Diagnosis not present

## 2024-07-11 DIAGNOSIS — I10 Essential (primary) hypertension: Secondary | ICD-10-CM | POA: Diagnosis not present

## 2024-07-11 DIAGNOSIS — J45909 Unspecified asthma, uncomplicated: Secondary | ICD-10-CM | POA: Diagnosis not present

## 2024-07-11 DIAGNOSIS — K5790 Diverticulosis of intestine, part unspecified, without perforation or abscess without bleeding: Secondary | ICD-10-CM | POA: Diagnosis not present

## 2024-07-11 DIAGNOSIS — G4733 Obstructive sleep apnea (adult) (pediatric): Secondary | ICD-10-CM | POA: Diagnosis not present

## 2024-07-11 DIAGNOSIS — E872 Acidosis, unspecified: Secondary | ICD-10-CM | POA: Diagnosis not present

## 2024-07-13 DIAGNOSIS — G4733 Obstructive sleep apnea (adult) (pediatric): Secondary | ICD-10-CM | POA: Diagnosis not present

## 2024-07-13 DIAGNOSIS — Z48815 Encounter for surgical aftercare following surgery on the digestive system: Secondary | ICD-10-CM | POA: Diagnosis not present

## 2024-07-13 DIAGNOSIS — J45909 Unspecified asthma, uncomplicated: Secondary | ICD-10-CM | POA: Diagnosis not present

## 2024-07-13 DIAGNOSIS — I10 Essential (primary) hypertension: Secondary | ICD-10-CM | POA: Diagnosis not present

## 2024-07-13 DIAGNOSIS — E872 Acidosis, unspecified: Secondary | ICD-10-CM | POA: Diagnosis not present

## 2024-07-13 DIAGNOSIS — F32A Depression, unspecified: Secondary | ICD-10-CM | POA: Diagnosis not present

## 2024-07-13 DIAGNOSIS — K5790 Diverticulosis of intestine, part unspecified, without perforation or abscess without bleeding: Secondary | ICD-10-CM | POA: Diagnosis not present

## 2024-07-13 DIAGNOSIS — Z433 Encounter for attention to colostomy: Secondary | ICD-10-CM | POA: Diagnosis not present

## 2024-07-13 DIAGNOSIS — F419 Anxiety disorder, unspecified: Secondary | ICD-10-CM | POA: Diagnosis not present

## 2024-07-14 DIAGNOSIS — J45909 Unspecified asthma, uncomplicated: Secondary | ICD-10-CM | POA: Diagnosis not present

## 2024-07-14 DIAGNOSIS — E872 Acidosis, unspecified: Secondary | ICD-10-CM | POA: Diagnosis not present

## 2024-07-14 DIAGNOSIS — F419 Anxiety disorder, unspecified: Secondary | ICD-10-CM | POA: Diagnosis not present

## 2024-07-14 DIAGNOSIS — Z48815 Encounter for surgical aftercare following surgery on the digestive system: Secondary | ICD-10-CM | POA: Diagnosis not present

## 2024-07-14 DIAGNOSIS — Z433 Encounter for attention to colostomy: Secondary | ICD-10-CM | POA: Diagnosis not present

## 2024-07-14 DIAGNOSIS — K5790 Diverticulosis of intestine, part unspecified, without perforation or abscess without bleeding: Secondary | ICD-10-CM | POA: Diagnosis not present

## 2024-07-14 DIAGNOSIS — F32A Depression, unspecified: Secondary | ICD-10-CM | POA: Diagnosis not present

## 2024-07-14 DIAGNOSIS — I10 Essential (primary) hypertension: Secondary | ICD-10-CM | POA: Diagnosis not present

## 2024-07-14 DIAGNOSIS — G4733 Obstructive sleep apnea (adult) (pediatric): Secondary | ICD-10-CM | POA: Diagnosis not present

## 2024-07-17 DIAGNOSIS — Z433 Encounter for attention to colostomy: Secondary | ICD-10-CM | POA: Diagnosis not present

## 2024-07-17 DIAGNOSIS — F32A Depression, unspecified: Secondary | ICD-10-CM | POA: Diagnosis not present

## 2024-07-17 DIAGNOSIS — I10 Essential (primary) hypertension: Secondary | ICD-10-CM | POA: Diagnosis not present

## 2024-07-17 DIAGNOSIS — J45909 Unspecified asthma, uncomplicated: Secondary | ICD-10-CM | POA: Diagnosis not present

## 2024-07-17 DIAGNOSIS — G4733 Obstructive sleep apnea (adult) (pediatric): Secondary | ICD-10-CM | POA: Diagnosis not present

## 2024-07-17 DIAGNOSIS — E872 Acidosis, unspecified: Secondary | ICD-10-CM | POA: Diagnosis not present

## 2024-07-17 DIAGNOSIS — Z48815 Encounter for surgical aftercare following surgery on the digestive system: Secondary | ICD-10-CM | POA: Diagnosis not present

## 2024-07-17 DIAGNOSIS — K5790 Diverticulosis of intestine, part unspecified, without perforation or abscess without bleeding: Secondary | ICD-10-CM | POA: Diagnosis not present

## 2024-07-17 DIAGNOSIS — F419 Anxiety disorder, unspecified: Secondary | ICD-10-CM | POA: Diagnosis not present

## 2024-07-18 ENCOUNTER — Other Ambulatory Visit: Payer: Self-pay

## 2024-07-18 NOTE — Transitions of Care (Post Inpatient/ED Visit) (Signed)
 07/18/2024  Patient ID: Jordan [name], male   DOB: 02-May-1981, 43 y.o.   MRN: [id number]   Placed call to patient, a lady answered and states that patient can not talk. Reports a rough night   Plan: will attempt again tomorrow.  Alan Ee, RN, BSN, CEN [employer]- Transition of Care Team.  [facility] [phone]

## 2024-07-19 ENCOUNTER — Encounter: Payer: Self-pay | Admitting: Family Medicine

## 2024-07-19 ENCOUNTER — Telehealth: Payer: Self-pay

## 2024-07-19 ENCOUNTER — Telehealth: Payer: Self-pay | Admitting: *Deleted

## 2024-07-19 ENCOUNTER — Ambulatory Visit (INDEPENDENT_AMBULATORY_CARE_PROVIDER_SITE_OTHER): Admitting: Family Medicine

## 2024-07-19 VITALS — BP 126/85 | HR 92 | Ht 66.0 in | Wt 276.8 lb

## 2024-07-19 DIAGNOSIS — K56609 Unspecified intestinal obstruction, unspecified as to partial versus complete obstruction: Secondary | ICD-10-CM | POA: Diagnosis not present

## 2024-07-19 DIAGNOSIS — Z933 Colostomy status: Secondary | ICD-10-CM | POA: Diagnosis not present

## 2024-07-19 MED ORDER — CYCLOBENZAPRINE HCL 10 MG PO TABS: 10.0000 mg | ORAL_TABLET | Freq: Three times a day (TID) | ORAL | 1 refills | Status: DC | PRN

## 2024-07-19 MED ORDER — OXYCODONE HCL 10 MG PO TABS: 10.0000 mg | ORAL_TABLET | Freq: Four times a day (QID) | ORAL | 0 refills | Status: AC | PRN

## 2024-07-19 MED ORDER — ONDANSETRON 4 MG PO TBDP: 4.0000 mg | ORAL_TABLET | Freq: Three times a day (TID) | ORAL | 1 refills | Status: DC | PRN

## 2024-07-19 NOTE — Transitions of Care (Post Inpatient/ED Visit) (Signed)
 Transition of Care week 2  Visit Note  07/19/2024  Name: Jordan [name] MRN: [id number]          DOB: [birth year]  Situation: Patient enrolled in [facility] 30-day program. Visit completed with patient by telephone.   Background:   Initial Transition Care Management Follow-up Telephone Call    Past Medical History:  Diagnosis Date   Anxiety and depression    Asthma    Chronic pain syndrome    GERD (gastroesophageal reflux disease)    History of kidney stones    HYPERTENSION, MILD    OSTEOARTHRITIS    Recurrent UTI    Sleep apnea     Assessment: Patient reports severe pain and pain mediations not working for him. Confirmed that he is taking his pain medications as prescribed.  Patient Reported Symptoms: Cognitive Cognitive Status: Able to follow simple commands, Alert and oriented to person, place, and time, Normal speech and language skills      Neurological Neurological Review of Symptoms: No symptoms reported    HEENT HEENT Symptoms Reported: No symptoms reported      Cardiovascular Cardiovascular Symptoms Reported: No symptoms reported    Respiratory Respiratory Symptoms Reported: No symptoms reported    Endocrine Endocrine Symptoms Reported: No symptoms reported Is patient diabetic?: No    Gastrointestinal Gastrointestinal Symptoms Reported: Abdominal pain or discomfort, Nausea Additional Gastrointestinal Details: patient reports pain is uncontrolled.  Reports nausea.  States wound vac is difficult to keep suctioning.  reports bowel movements are normal for him with the exception of whole pills noted in stools. Gastrointestinal Management Strategies: Medication therapy Gastrointestinal Comment: Patient reports that he is manipulating wound vac himself vs calling home health. Encouraged patient to call home health nurse with concerns.    Genitourinary Genitourinary Symptoms Reported: No symptoms reported    Integumentary Integumentary Symptoms Reported:  Wound Additional Integumentary Details: wound vac in place per patient. Very little drainage    Musculoskeletal Musculoskelatal Symptoms Reviewed: No symptoms reported        Psychosocial Psychosocial Symptoms Reported: No symptoms reported         There were no vitals filed for this visit.  Medications Reviewed Today     Reviewed by Rumalda Alan PENNER, RN (Registered Nurse) on 07/19/24 at 1151  Med List Status: <None>   Medication Order Taking? Sig Documenting Provider Last Dose Status Informant  acetaminophen  (TYLENOL ) 500 MG tablet [id number] Yes Take 1,000 mg by mouth every 6 (six) hours as needed for mild pain (pain score 1-3) or moderate pain (pain score 4-6). [provider]  Active   albuterol  (VENTOLIN  HFA) 108 (90 Base) MCG/ACT inhaler [id number] Yes Inhale 2 puffs into the lungs every 6 (six) hours as needed for wheezing or shortness of breath. Wallace Joesph LABOR, PA  Active Self, Spouse/Significant Other, Pharmacy Records  amoxicillin -clavulanate (AUGMENTIN ) 875-125 MG tablet [id number] Yes Take 1 tablet by mouth every 12 (twelve) hours for 14 days. Debby Hila, MD  Active   gabapentin  (NEURONTIN ) 300 MG capsule [id number] Yes Take 1 capsule (300 mg total) by mouth 3 (three) times daily. Debby Hila, MD  Active   ibuprofen  (ADVIL ) 200 MG tablet [id number]  Take 800 mg by mouth 2 (two) times daily as needed for moderate pain (pain score 4-6).  Patient not taking: Reported on 07/19/2024   [provider]  Active Self, Spouse/Significant Other, Pharmacy Records  methocarbamol  1000 MG TABS [id number] Yes Take 1,000 mg by mouth 4 (four) times daily for 14 days.  Debby Hila, MD  Active   omeprazole (PRILOSEC OTC) 20 MG tablet [id number] Yes Take 20 mg by mouth daily as needed (heartburn). [provider]  Active Self, Spouse/Significant Other, Pharmacy Records  oxyCODONE  10 MG TABS [id number] Yes Take 1-1.5 tablets (10-15 mg total) by mouth every 6 (six) hours  as needed for moderate pain (pain score 4-6) or severe pain (pain score 7-10) (10 mg for moderate, 15 mg for severe).  Patient taking differently: Take 1-1.5 tablets (10-15 mg total) by mouth every 6 (six) hours as needed for moderate pain (pain score 4-6) or severe pain (pain score 7-10) (10 mg for moderate, 15 mg for severe).   Thomas, Alicia, MD  Active   polyethylene glycol (MIRALAX  / GLYCOLAX ) 17 g packet [id number] Yes Take 17 g by mouth daily as needed for mild constipation. Debby Hila, MD  Active             Goals      Patient Stated     05/31/2024, wants to lose weight     [facility] Transitions of Care [facility]) Care Plan     Problems:  Recent Hospitalization for treatment of Laparotomy with LOA, small bowel resection, repair of parastomal hernia and revision of descending colostomy  07/19/2024  Reports that he is having severe pain. Reports that he was originally prescribed oxycodone  10-15 mg every 6 hours.  Patient reports that he ran out and new RX sent in for Oxycodone  5mg  every 6 hours. Patient reports that this dose of pain medication is not controlling his pain 8/10.  No Specialist appointment Patient has contact number for Dr. Curvin and will call for post op follow up  07/19/2024  Surgeon follow up scheduled for 07/26/2024  per patient report. Nausea. 07/19/2024  Reports nausea and decrease in appetite. Undigested pills- 07/19/2024  patient reports that he is seeing whole pills in his stool. Uncontrolled pain.  07/19/2024 patient reports uncontrolled pain Difficulty with wound vac  Goal:  Over the next 30 days, the patient will not experience hospital readmission  Interventions:  Transitions of Care: Durable Medical Equipment (DME) reviewed with patient. Encouraged patient to call home health nurse when he is having problems with wound vac Doctor Visits  - discussed the importance of doctor visits. Confirmed PCP visit today and specialist/surgeon visit on 07/26/2024. Confirmed  patient has transportation to these appointments. Per patient home health nurse is coming on 07/20/2024  encouraged patient to discuss concerns with nurse at this visit.  Reviewed and assessed pain.  Encouraged patient to discuss with PCP today at office visit and to call the surgeon to notify of pain. Assessed nausea- encouraged patient to discuss nausea today with PCP and to also inform surgeon Reviewed the importance of eating a protein rich diet to help with healing Reviewed importance of activity and exercise and following his restriction per discharge orders.   Patient Self Care Activities:  Attend all scheduled provider appointments Call provider office for new concerns or questions  Notify RN Care Manager of TOC call rescheduling needs Participate in Transition of Care Program/Attend TOC scheduled calls Take medications as prescribed   See PCP for todays appointment Notify surgeon of pain and nausea Call home health nurse with problems with wound vac   Plan:  Telephone follow up appointment with care management team member scheduled for:  07/26/24 at 11 am with Alan Ee  RN        Recommendation:   PCP Follow-up  today Surgeon follow up  on 07/26/2024  Follow Up Plan:   Telephone follow up appointment date/time:  07/26/2024  at 11 am  Alan Ee, RN, BSN, [employer]- Transition of Care Team.  [facility] Based [employer] [phone]

## 2024-07-19 NOTE — Telephone Encounter (Signed)
 Copied from CRM [id number]. Topic: General - Other >> Jul 19, 2024  4:00 PM Santiya F wrote: Reason for CRM: Patient's wife is calling in because patient was given a referral to a Gastro office and was told they couldn't do anything for him because he has an ostomy. Patient's wife is requesting the practice admin give her a call. She said she requested to speak with the admin prior but was sent to a voicemail that asked her for a code.

## 2024-07-19 NOTE — Telephone Encounter (Signed)
 Spoke with patients wife and she informed me that the GI office stated that they would not be able see the patient.  Spoke with provider and said to have patient reach out to surgeon and see what they suggest. I informed pt of this.

## 2024-07-19 NOTE — Patient Instructions (Signed)
 Visit Information  Thank you for taking time to visit with me today. Please don't hesitate to contact me if I can be of assistance to you before our next scheduled telephone appointment.  Our next appointment is by telephone on 07/26/2024 at 11  Following is a copy of your care plan:   Goals Addressed             This Visit's Progress    [facility]I Transitions of Care (TOC) Care Plan       Problems:  Recent Hospitalization for treatment of Laparotomy with LOA, small bowel resection, repair of parastomal hernia and revision of descending colostomy  07/19/2024  Reports that he is having severe pain. Reports that he was originally prescribed oxycodone  10-15 mg every 6 hours.  Patient reports that he ran out and new RX sent in for Oxycodone  5mg  every 6 hours. Patient reports that this dose of pain medication is not controlling his pain 8/10.  No Specialist appointment Patient has contact number for Dr. Curvin and will call for post op follow up  07/19/2024  Surgeon follow up scheduled for 07/26/2024  per patient report. Nausea. 07/19/2024  Reports nausea and decrease in appetite. Undigested pills- 07/19/2024  patient reports that he is seeing whole pills in his stool. Uncontrolled pain.  07/19/2024 patient reports uncontrolled pain Difficulty with wound vac  Goal:  Over the next 30 days, the patient will not experience hospital readmission  Interventions:  Transitions of Care: Durable Medical Equipment (DME) reviewed with patient. Encouraged patient to call home health nurse when he is having problems with wound vac Doctor Visits  - discussed the importance of doctor visits. Confirmed PCP visit today and specialist/surgeon visit on 07/26/2024. Confirmed patient has transportation to these appointments. Per patient home health nurse is coming on 07/20/2024  encouraged patient to discuss concerns with nurse at this visit.  Reviewed and assessed pain.  Encouraged patient to discuss with PCP today at  office visit and to call the surgeon to notify of pain. Assessed nausea- encouraged patient to discuss nausea today with PCP and to also inform surgeon Reviewed the importance of eating a protein rich diet to help with healing Reviewed importance of activity and exercise and following his restriction per discharge orders.   Patient Self Care Activities:  Attend all scheduled provider appointments Call provider office for new concerns or questions  Notify RN Care Manager of TOC call rescheduling needs Participate in Transition of Care Program/Attend TOC scheduled calls Take medications as prescribed   See PCP for todays appointment Notify surgeon of pain and nausea Call home health nurse with problems with wound vac   Plan:  Telephone follow up appointment with care management team member scheduled for:  07/26/24 at 11 am with Alan Ee  RN        Patient verbalizes understanding of instructions and care plan provided today and agrees to view in MyChart. Active MyChart status and patient understanding of how to access instructions and care plan via MyChart confirmed with patient.     Telephone follow up appointment with care management team member scheduled for:  07/26/2024 at 11 am  Please call the care guide team at [phone] if you need to cancel or reschedule your appointment.   Please call the Suicide and Crisis Lifeline: 988 call the [place]  National Suicide Prevention Lifeline: 3175083035 or TTY: (712) 327-7777 TTY (904)785-0055) to talk to a trained counselor call 1-800-273-TALK (toll free, 24 hour hotline) go to [facility]  Urgent Care [address], [place] [id number]) call 911 if you are experiencing a Mental Health or Behavioral Health Crisis or need someone to talk to.  Alan Ee, RN, BSN, CEN [employer]- Transition of Care Team.  [facility] [phone]

## 2024-07-19 NOTE — Patient Instructions (Signed)
 It was nice to see you today,  We addressed the following topics today: -I have sent in the medications we discussed - I have also sent in a order for the ostomy supplies - Please schedule your lab visit for next week.  Have a great day,  Rolan Slain, MD

## 2024-07-19 NOTE — Progress Notes (Signed)
 Established Patient Office Visit  Subjective   Patient ID: Jordan [name], male    DOB: Feb 11, 1981  Age: 43 y.o. MRN: [id number]  Chief Complaint  Patient presents with   Hospitalization Follow-up    HPI Subjective - Hospital follow-up for small bowel obstruction with partially incarcerated loop of bowel, requiring small bowel resection and large bowel revision. - Reports increased pain since discharge. - Experiencing issues with medication absorption; has observed whole tablets of antibiotic, Tylenol , and muscle relaxer in ostomy output. Crushing the antibiotic and mixing with applesauce resulted in emesis. This has been an issue since discharge from the hospital. - Reports issues with the ostomy bag leaking into the wound vac dressing due to proximity.   - Reports nausea, which is thought to be related to pain. - Reports significant swelling in the legs, especially on one side, which comes and goes. - reports abdominal swelling, now improved.  - Reports scrotal swelling, non-painful. - Reports feeling dizzy, lightheaded, and weak with ambulation. - Needs a new prescription for ostomy supplies as the current ones do not fit properly since surgery, and insurance will not cover more without a new prescription. Currently has very few supplies left and is washing and reusing bags.  Medications Current medications include fluvastatin, Tylenol , metocarbamol (muscle relaxer), oxycodone , gabapentin  (new since hospitalization), and amoxicillin . Reports seeing tablets of the antibiotic, Tylenol , and metocarbamol in the ostomy output. Gabapentin  is a capsule and seems to be absorbed. Previously used Flexeril  (cyclobenzaprine ) which was effective. Oxycodone  dose was reduced from 15 mg to 5 mg post-discharge, which is not adequately controlling pain.  PMH, PSH, FH, Social Hx PMHx: History of small bowel obstruction. PSH: Small bowel resection, large bowel revision, ostomy. Social  Hx: Receives home health for wound vac changes twice weekly.  ROS GI: Reports nausea. Denies cramping. History of severe nausea pre-operatively due to bowel obstruction. Constitutional: Reports weakness. Neurologic: Reports dizziness and lightheadedness with exertion.   The 10-year ASCVD risk score (Arnett DK, et al., 2019) is: 1.3%  Health Maintenance Due  Topic Date Due   Pneumococcal Vaccine 20-51 Years old (1 of 2 - PCV) Never done   Hepatitis B Vaccines (1 of 3 - 19+ 3-dose series) Never done   HPV VACCINES (1 - 3-dose SCDM series) Never done   COVID-19 Vaccine (1 - 2024-25 season) Never done      Objective:     BP 126/85   Pulse 92   Ht 5' 6 (1.676 m)   Wt 276 lb 12.8 oz (125.6 kg)   SpO2 100%   BMI 44.68 kg/m    Physical Exam Gen: alert, oriented Cv: rrr Gi: colostomy present.  Laparotomy incision with wound vac in place. No signs of infection.     No results found for any visits on 07/19/24.      Assessment & Plan:   SBO (small bowel obstruction) (HCC) Assessment & Plan: Post-operative follow-up (SBO with small bowel resection) Patient is post-op from a small bowel resection for an incarcerated loop of bowel. Currently managing a wound vac and a revised ostomy at home with home health assistance. Experiencing issues with significant pain, medication malabsorption, nausea, and swelling. Ostomy supplies are ill-fitting post-surgically, leading to leakage and supply shortages. Surgical follow-up is scheduled for 07/26/2024.  Pt having issues with absorption of some of his medications.  - Continue wound vac care with home health. - Advised on timing pain medication prior to dressing changes to manage procedural pain. -  Will order labs (CMP) to be drawn tomorrow or Monday. - Will refer to Gastroenterology with LaPaur's office contact information provided. - Change amoxicillin  from tablet to suspension to improve absorption. - Discontinue metocarbamol and  prescribe cyclobenzaprine  (Flexeril ) 10mg . - Increase oxycodone  back to 10mg . Sent new prescription to pharmacy today. Advised that pharmacy may hold it until the current supply is due to run out on Monday. Counseled on risks of running out over the weekend. - Prescribe anti-nausea medication. Counseled that it may slow gut transit, which could be beneficial, but to watch for cramping  Orders: -     Comprehensive metabolic panel with GFR; Future -     CBC with Differential/Platelet; Future  Colostomy present [facility]) Assessment & Plan: Current ostomy wafer and bag system does not fit properly since surgery, causing leakage. Patient is out of supplies and insurance requires a new prescription. - Will send a new prescription to [facility] for updated ostomy supplies  Ostomy bag - Holister 18134.  70mm 2 3/4 inch.  Wafer and the bags.   [address] New Image CeraPlus Skin Barrier 8805 [employer] CeraRing Barrier Ostomy Rings     Status post colostomy (HCC) -     For home use only DME Ostomy supplies  Other orders -     Cyclobenzaprine  HCl; Take 1 tablet (10 mg total) by mouth 3 (three) times daily as needed for muscle spasms.  Dispense: 30 tablet; Refill: 1 -     oxyCODONE  HCl; Take 1 tablet (10 mg total) by mouth every 6 (six) hours as needed for up to 5 days.  Dispense: 20 tablet; Refill: 0 -     Ondansetron ; Take 1 tablet (4 mg total) by mouth every 8 (eight) hours as needed for nausea or vomiting.  Dispense: 20 tablet; Refill: 1     Return in about 6 weeks (around 08/30/2024) for f/u surgery.    Toribio MARLA Slain, MD

## 2024-07-19 NOTE — Telephone Encounter (Signed)
 Per patient request, I have faxed over ostomy order to [facility] ph [phone] fax [phone].

## 2024-07-20 DIAGNOSIS — K5790 Diverticulosis of intestine, part unspecified, without perforation or abscess without bleeding: Secondary | ICD-10-CM | POA: Diagnosis not present

## 2024-07-20 DIAGNOSIS — G4733 Obstructive sleep apnea (adult) (pediatric): Secondary | ICD-10-CM | POA: Diagnosis not present

## 2024-07-20 DIAGNOSIS — F419 Anxiety disorder, unspecified: Secondary | ICD-10-CM | POA: Diagnosis not present

## 2024-07-20 DIAGNOSIS — I10 Essential (primary) hypertension: Secondary | ICD-10-CM | POA: Diagnosis not present

## 2024-07-20 DIAGNOSIS — F32A Depression, unspecified: Secondary | ICD-10-CM | POA: Diagnosis not present

## 2024-07-20 DIAGNOSIS — Z433 Encounter for attention to colostomy: Secondary | ICD-10-CM | POA: Diagnosis not present

## 2024-07-20 DIAGNOSIS — J45909 Unspecified asthma, uncomplicated: Secondary | ICD-10-CM | POA: Diagnosis not present

## 2024-07-20 DIAGNOSIS — E872 Acidosis, unspecified: Secondary | ICD-10-CM | POA: Diagnosis not present

## 2024-07-20 DIAGNOSIS — Z48815 Encounter for surgical aftercare following surgery on the digestive system: Secondary | ICD-10-CM | POA: Diagnosis not present

## 2024-07-21 NOTE — Assessment & Plan Note (Signed)
 Post-operative follow-up (SBO with small bowel resection) Patient is post-op from a small bowel resection for an incarcerated loop of bowel. Currently managing a wound vac and a revised ostomy at home with home health assistance. Experiencing issues with significant pain, medication malabsorption, nausea, and swelling. Ostomy supplies are ill-fitting post-surgically, leading to leakage and supply shortages. Surgical follow-up is scheduled for 07/26/2024.  Pt having issues with absorption of some of his medications.  - Continue wound vac care with home health. - Advised on timing pain medication prior to dressing changes to manage procedural pain. - Will order labs (CMP) to be drawn tomorrow or Monday. - Will refer to Gastroenterology with LaPaur's office contact information provided. - Change amoxicillin  from tablet to suspension to improve absorption. - Discontinue metocarbamol and prescribe cyclobenzaprine  (Flexeril ) 10mg . - Increase oxycodone  back to 10mg . Sent new prescription to pharmacy today. Advised that pharmacy may hold it until the current supply is due to run out on Monday. Counseled on risks of running out over the weekend. - Prescribe anti-nausea medication. Counseled that it may slow gut transit, which could be beneficial, but to watch for cramping

## 2024-07-21 NOTE — Assessment & Plan Note (Addendum)
 Current ostomy wafer and bag system does not fit properly since surgery, causing leakage. Patient is out of supplies and insurance requires a new prescription. - Will send a new prescription to [facility] for updated ostomy supplies  Ostomy bag - Holister 18134.  70mm 2 3/4 inch.  Wafer and the bags.   [address] New Image CeraPlus Skin Barrier 8805 [employer] CeraRing Barrier Ostomy Rings

## 2024-07-23 ENCOUNTER — Telehealth: Payer: Self-pay | Admitting: *Deleted

## 2024-07-23 ENCOUNTER — Telehealth: Payer: Self-pay | Admitting: Family Medicine

## 2024-07-23 ENCOUNTER — Other Ambulatory Visit

## 2024-07-23 DIAGNOSIS — K56609 Unspecified intestinal obstruction, unspecified as to partial versus complete obstruction: Secondary | ICD-10-CM

## 2024-07-23 NOTE — Telephone Encounter (Signed)
 Contacted pts wife and informed her that I reached out to [employer] and they stated that they received the order however it is still in review.  Confirmed that they would reach out if there is anything else we need to do to get the patient the supplies he needs.

## 2024-07-23 NOTE — Telephone Encounter (Signed)
 Copied from CRM [id number]. Topic: General - Other >> Jul 23, 2024 12:32 PM Donee H wrote: Reason for CRM:  Patient's wife Eleanor called to state she wanted to give the correct measurements for supplies prescribed for patient. She states she want to make sure insurance will cover supplies. It's 45 cm wide and 30 cm up & down. She states dimension has to be correct for insurance to cover. She stated [employer] will be coming to confirm supplies were being prescribed by Dr. Chandra

## 2024-07-23 NOTE — Telephone Encounter (Signed)
 This was in reference to another phone note from this date as well.

## 2024-07-23 NOTE — Telephone Encounter (Signed)
 Copied from CRM [id number]. Topic: General - Other >> Jul 23, 2024 12:18 PM Tiffany S wrote: Reason for CRM: Need new office note for ostomy supplies please follow up with patient [phone]

## 2024-07-24 DIAGNOSIS — E872 Acidosis, unspecified: Secondary | ICD-10-CM | POA: Diagnosis not present

## 2024-07-24 DIAGNOSIS — F419 Anxiety disorder, unspecified: Secondary | ICD-10-CM | POA: Diagnosis not present

## 2024-07-24 DIAGNOSIS — Z433 Encounter for attention to colostomy: Secondary | ICD-10-CM | POA: Diagnosis not present

## 2024-07-24 DIAGNOSIS — Z48815 Encounter for surgical aftercare following surgery on the digestive system: Secondary | ICD-10-CM | POA: Diagnosis not present

## 2024-07-24 DIAGNOSIS — I10 Essential (primary) hypertension: Secondary | ICD-10-CM | POA: Diagnosis not present

## 2024-07-24 DIAGNOSIS — J45909 Unspecified asthma, uncomplicated: Secondary | ICD-10-CM | POA: Diagnosis not present

## 2024-07-24 DIAGNOSIS — K5732 Diverticulitis of large intestine without perforation or abscess without bleeding: Secondary | ICD-10-CM | POA: Diagnosis not present

## 2024-07-24 DIAGNOSIS — G4733 Obstructive sleep apnea (adult) (pediatric): Secondary | ICD-10-CM | POA: Diagnosis not present

## 2024-07-24 DIAGNOSIS — Z933 Colostomy status: Secondary | ICD-10-CM | POA: Diagnosis not present

## 2024-07-24 DIAGNOSIS — F32A Depression, unspecified: Secondary | ICD-10-CM | POA: Diagnosis not present

## 2024-07-24 DIAGNOSIS — K631 Perforation of intestine (nontraumatic): Secondary | ICD-10-CM | POA: Diagnosis not present

## 2024-07-24 DIAGNOSIS — K5790 Diverticulosis of intestine, part unspecified, without perforation or abscess without bleeding: Secondary | ICD-10-CM | POA: Diagnosis not present

## 2024-07-26 ENCOUNTER — Other Ambulatory Visit: Payer: Self-pay

## 2024-07-26 ENCOUNTER — Other Ambulatory Visit: Payer: Self-pay | Admitting: General Surgery

## 2024-07-26 DIAGNOSIS — K56609 Unspecified intestinal obstruction, unspecified as to partial versus complete obstruction: Secondary | ICD-10-CM | POA: Diagnosis not present

## 2024-07-26 DIAGNOSIS — K437 Other and unspecified ventral hernia with gangrene: Secondary | ICD-10-CM | POA: Insufficient documentation

## 2024-07-26 DIAGNOSIS — R1084 Generalized abdominal pain: Secondary | ICD-10-CM

## 2024-07-26 NOTE — Patient Instructions (Signed)
 Visit Information  Thank you for taking time to visit with me today. Please don't hesitate to contact me if I can be of assistance to you before our next scheduled telephone appointment.  Our next appointment is by telephone on 08/01/2024 at 230 pm  Following is a copy of your care plan:   Goals Addressed             This Visit's Progress    [facility] Transitions of Care (TOC) Care Plan       Problems:  Recent Hospitalization for treatment of Laparotomy with LOA, small bowel resection, repair of parastomal hernia and revision of descending colostomy  07/19/2024  Reports that he is having severe pain. Reports that he was originally prescribed oxycodone  10-15 mg every 6 hours.  Patient reports that he ran out and new RX sent in for Oxycodone  5mg  every 6 hours. Patient reports that this dose of pain medication is not controlling his pain 8/10. 07/26/2024  Patient reports that he is till having pain 7/10, reports taking his medications as prescribed.  Reports that he is still has his wound vac on and continues to have ostomy problems.  ( Reports difficulty getting ostomy to stay attached) No Specialist appointment Patient has contact number for Dr. Curvin and will call for post op follow up  07/19/2024  Surgeon follow up scheduled for 07/26/2024  per patient report.  07/26/2024  Has surgeon follow today.  Nausea. 07/19/2024  Reports nausea and decrease in appetite. 07/26/2024  Denies nausea today and reports increased appetite.  Undigested pills- 07/19/2024  patient reports that he is seeing whole pills in his stool. 07/26/2024  patient discussed with surgeon today and will follow up with PCP.  Uncontrolled pain.  07/19/2024 patient reports uncontrolled pain.  07/26/2024  reports pain decreased and under better management.  Difficulty with wound vac,   07/26/2024 Reviewed difficulties with wound vac.  Reports home health nurse states wound vac will come off in about a week.   Goal:  Over the next 30 days,  the patient will not experience hospital readmission  Interventions:  Transitions of Care: Durable Medical Equipment (DME) reviewed with patient. Encouraged patient to call home health nurse when he is having problems with wound vac.  Again encouraged patient to seek help with home health nurse. Doctor Visits  - discussed the importance of doctor visits. Confirmed PCP visit today and specialist/surgeon visit on 07/26/2024. Confirmed patient has transportation to these appointments.,  Reviewed importance of continued follow up.  Reviewed EMR and previous MD appointment.  Reviewed and reassessed pain.   Re Assessed nausea- Reassessed improvement in appetite Reviewed with patient that his wife is working on his ostomy supplies and his GI follow up-  Patient Self Care Activities:  Attend all scheduled provider appointments Call provider office for new concerns or questions  Notify RN Care Manager of TOC call rescheduling needs Participate in Transition of Care Program/Attend TOC scheduled calls Take medications as prescribed   Follow directions from PCP and specialist from recent appointments Ask home health nurse about recommendation about ostomy bag appplications.   Plan:  Telephone follow up appointment with care management team member scheduled for:  08/01/2024 at 230 pm am with Alan Ee  RN        Patient verbalizes understanding of instructions and care plan provided today and agrees to view in MyChart. Active MyChart status and patient understanding of how to access instructions and care plan via MyChart confirmed with patient.  Telephone follow up appointment with care management team member scheduled for:  08/01/2024  Please call the care guide team at [phone] if you need to cancel or reschedule your appointment.   Please call the Suicide and Crisis Lifeline: 988 call the [place]  National Suicide Prevention Lifeline: 7478597506 or TTY: (365) 871-0598 TTY 313-192-6148) to  talk to a trained counselor call 1-800-273-TALK (toll free, 24 hour hotline) call 911 if you are experiencing a Mental Health or Behavioral Health Crisis or need someone to talk to.  Alan Ee, RN, BSN, CEN [employer]- Transition of Care Team.  [facility] [phone]

## 2024-07-26 NOTE — Transitions of Care (Post Inpatient/ED Visit) (Signed)
 Transition of Care week 3  Visit Note  07/26/2024  Name: Jordan [name] MRN: [id number]          DOB: 1981-12-09  Situation: Patient enrolled in [facility] 30-day program. Visit completed with patient by telephone.   Background:   Initial Transition Care Management Follow-up Telephone Call    Past Medical History:  Diagnosis Date   Anxiety and depression    Asthma    Chronic pain syndrome    GERD (gastroesophageal reflux disease)    History of kidney stones    HYPERTENSION, MILD    OSTEOARTHRITIS    Recurrent UTI    Sleep apnea     Assessment: Patient reports that he is getting better slowly.  Reports appetite is better and nausea is decreased.  Patient Reported Symptoms: Cognitive Cognitive Status: Able to follow simple commands, Alert and oriented to person, place, and time, Normal speech and language skills      Neurological Neurological Review of Symptoms: No symptoms reported    HEENT HEENT Symptoms Reported: No symptoms reported      Cardiovascular Cardiovascular Symptoms Reported: No symptoms reported    Respiratory Respiratory Symptoms Reported: No symptoms reported    Endocrine Endocrine Symptoms Reported: No symptoms reported Is patient diabetic?: No    Gastrointestinal Gastrointestinal Symptoms Reported: Abdominal pain or discomfort Additional Gastrointestinal Details: pain from surgical area.  Reports that wound vac is not having much  drainage,   Improve appetite.  no nausea, no weight loss Gastrointestinal Management Strategies: Medication therapy    Genitourinary Genitourinary Symptoms Reported: Other Other Genitourinary Symptoms: hesistancy.    Integumentary Integumentary Symptoms Reported: Wound    Musculoskeletal Musculoskelatal Symptoms Reviewed: No symptoms reported        Psychosocial Psychosocial Symptoms Reported: No symptoms reported         Today's Vitals   07/26/24 1249  PainSc: 7      Medications Reviewed Today      Reviewed by Rumalda Alan PENNER, RN (Registered Nurse) on 07/26/24 at 1243  Med List Status: <None>   Medication Order Taking? Sig Documenting Provider Last Dose Status Informant  acetaminophen  (TYLENOL ) 500 MG tablet [id number]  Take 1,000 mg by mouth every 6 (six) hours as needed for mild pain (pain score 1-3) or moderate pain (pain score 4-6). [provider]  Active   albuterol  (VENTOLIN  HFA) 108 (90 Base) MCG/ACT inhaler [id number]  Inhale 2 puffs into the lungs every 6 (six) hours as needed for wheezing or shortness of breath. Wallace Joesph LABOR, PA  Active Self, Spouse/Significant Other, Pharmacy Records  cyclobenzaprine  (FLEXERIL ) 10 MG tablet [id number]  Take 1 tablet (10 mg total) by mouth 3 (three) times daily as needed for muscle spasms. [name], MD  Active   gabapentin  (NEURONTIN ) 300 MG capsule [id number]  Take 1 capsule (300 mg total) by mouth 3 (three) times daily. Debby Hila, MD  Active   ibuprofen  (ADVIL ) 200 MG tablet [id number]  Take 800 mg by mouth 2 (two) times daily as needed for moderate pain (pain score 4-6).  Patient not taking: Reported on 07/19/2024   [provider]  Active Self, Spouse/Significant Other, Pharmacy Records  omeprazole (PRILOSEC OTC) 20 MG tablet [id number]  Take 20 mg by mouth daily as needed (heartburn). [provider]  Active Self, Spouse/Significant Other, Pharmacy Records  ondansetron  (ZOFRAN -ODT) 4 MG disintegrating tablet [id number]  Take 1 tablet (4 mg total) by mouth every 8 (eight) hours as needed for nausea or vomiting. Chandra,  Toribio POUR, MD  Active   polyethylene glycol (MIRALAX  / GLYCOLAX ) 17 g packet [id number]  Take 17 g by mouth daily as needed for mild constipation. Debby Hila, MD  Active             Goals      Patient Stated     05/31/2024, wants to lose weight     [facility] Transitions of Care [facility]) Care Plan     Problems:  Recent Hospitalization for treatment of Laparotomy with LOA, small bowel  resection, repair of parastomal hernia and revision of descending colostomy  07/19/2024  Reports that he is having severe pain. Reports that he was originally prescribed oxycodone  10-15 mg every 6 hours.  Patient reports that he ran out and new RX sent in for Oxycodone  5mg  every 6 hours. Patient reports that this dose of pain medication is not controlling his pain 8/10. 07/26/2024  Patient reports that he is till having pain 7/10, reports taking his medications as prescribed.  Reports that he is still has his wound vac on and continues to have ostomy problems.  ( Reports difficulty getting ostomy to stay attached) No Specialist appointment Patient has contact number for Dr. Curvin and will call for post op follow up  07/19/2024  Surgeon follow up scheduled for 07/26/2024  per patient report.  07/26/2024  Has surgeon follow today.  Nausea. 07/19/2024  Reports nausea and decrease in appetite. 07/26/2024  Denies nausea today and reports increased appetite.  Undigested pills- 07/19/2024  patient reports that he is seeing whole pills in his stool. 07/26/2024  patient discussed with surgeon today and will follow up with PCP.  Uncontrolled pain.  07/19/2024 patient reports uncontrolled pain.  07/26/2024  reports pain decreased and under better management.  Difficulty with wound vac,   07/26/2024 Reviewed difficulties with wound vac.  Reports home health nurse states wound vac will come off in about a week.   Goal:  Over the next 30 days, the patient will not experience hospital readmission  Interventions:  Transitions of Care: Durable Medical Equipment (DME) reviewed with patient. Encouraged patient to call home health nurse when he is having problems with wound vac.  Again encouraged patient to seek help with home health nurse. Doctor Visits  - discussed the importance of doctor visits. Confirmed PCP visit today and specialist/surgeon visit on 07/26/2024. Confirmed patient has transportation to these appointments.,   Reviewed importance of continued follow up.  Reviewed EMR and previous MD appointment.  Reviewed and reassessed pain.   Re Assessed nausea- Reassessed improvement in appetite Reviewed with patient that his wife is working on his ostomy supplies and his GI follow up-  Patient Self Care Activities:  Attend all scheduled provider appointments Call provider office for new concerns or questions  Notify RN Care Manager of TOC call rescheduling needs Participate in Transition of Care Program/Attend TOC scheduled calls Take medications as prescribed   Follow directions from PCP and specialist from recent appointments Ask home health nurse about recommendation about ostomy bag appplications.   Plan:  Telephone follow up appointment with care management team member scheduled for:  08/01/2024 at 230 pm am with Alan Ee  RN        Recommendation:   Continue Current Plan of Care  Follow Up Plan:   Telephone follow up appointment date/time:  08/01/2024 at 230 pm  Alan Ee, RN, BSN, CEN [facility]- Transition of Care Team.  [facility] [phone]

## 2024-07-27 ENCOUNTER — Ambulatory Visit: Payer: Self-pay

## 2024-07-27 DIAGNOSIS — J45909 Unspecified asthma, uncomplicated: Secondary | ICD-10-CM | POA: Diagnosis not present

## 2024-07-27 DIAGNOSIS — G4733 Obstructive sleep apnea (adult) (pediatric): Secondary | ICD-10-CM | POA: Diagnosis not present

## 2024-07-27 DIAGNOSIS — F32A Depression, unspecified: Secondary | ICD-10-CM | POA: Diagnosis not present

## 2024-07-27 DIAGNOSIS — Z48815 Encounter for surgical aftercare following surgery on the digestive system: Secondary | ICD-10-CM | POA: Diagnosis not present

## 2024-07-27 DIAGNOSIS — Z433 Encounter for attention to colostomy: Secondary | ICD-10-CM | POA: Diagnosis not present

## 2024-07-27 DIAGNOSIS — K5790 Diverticulosis of intestine, part unspecified, without perforation or abscess without bleeding: Secondary | ICD-10-CM | POA: Diagnosis not present

## 2024-07-27 DIAGNOSIS — F419 Anxiety disorder, unspecified: Secondary | ICD-10-CM | POA: Diagnosis not present

## 2024-07-27 DIAGNOSIS — E872 Acidosis, unspecified: Secondary | ICD-10-CM | POA: Diagnosis not present

## 2024-07-27 DIAGNOSIS — I10 Essential (primary) hypertension: Secondary | ICD-10-CM | POA: Diagnosis not present

## 2024-07-27 LAB — CBC WITH DIFFERENTIAL/PLATELET
Basophils Absolute: 0 x10E3/uL (ref 0.0–0.2)
Basos: 1 %
EOS (ABSOLUTE): 0.2 x10E3/uL (ref 0.0–0.4)
Eos: 4 %
Hematocrit: 35.5 % — ABNORMAL LOW (ref 37.5–51.0)
Hemoglobin: 10.9 g/dL — ABNORMAL LOW (ref 13.0–17.7)
Immature Grans (Abs): 0 x10E3/uL (ref 0.0–0.1)
Immature Granulocytes: 0 %
Lymphocytes Absolute: 2 x10E3/uL (ref 0.7–3.1)
Lymphs: 31 %
MCH: 26.1 pg — ABNORMAL LOW (ref 26.6–33.0)
MCHC: 30.7 g/dL — ABNORMAL LOW (ref 31.5–35.7)
MCV: 85 fL (ref 79–97)
Monocytes Absolute: 0.5 x10E3/uL (ref 0.1–0.9)
Monocytes: 8 %
Neutrophils Absolute: 3.7 x10E3/uL (ref 1.4–7.0)
Neutrophils: 55 %
Platelets: 335 x10E3/uL (ref 150–450)
RBC: 4.17 x10E6/uL (ref 4.14–5.80)
RDW: 13.1 % (ref 11.6–15.4)
WBC: 6.6 x10E3/uL (ref 3.4–10.8)

## 2024-07-27 LAB — COMPREHENSIVE METABOLIC PANEL WITH GFR
ALT: 14 IU/L (ref 0–44)
AST: 17 IU/L (ref 0–40)
Albumin: 3.7 g/dL — ABNORMAL LOW (ref 4.1–5.1)
Alkaline Phosphatase: 82 IU/L (ref 44–121)
BUN/Creatinine Ratio: 5 — ABNORMAL LOW (ref 9–20)
BUN: 4 mg/dL — ABNORMAL LOW (ref 6–24)
Bilirubin Total: 0.2 mg/dL (ref 0.0–1.2)
CO2: 20 mmol/L (ref 20–29)
Calcium: 8.8 mg/dL (ref 8.7–10.2)
Chloride: 103 mmol/L (ref 96–106)
Creatinine, Ser: 0.75 mg/dL — ABNORMAL LOW (ref 0.76–1.27)
Globulin, Total: 2.8 g/dL (ref 1.5–4.5)
Glucose: 80 mg/dL (ref 70–99)
Potassium: 4.2 mmol/L (ref 3.5–5.2)
Sodium: 138 mmol/L (ref 134–144)
Total Protein: 6.5 g/dL (ref 6.0–8.5)
eGFR: 114 mL/min/1.73 (ref >59)

## 2024-07-31 ENCOUNTER — Ambulatory Visit
Admission: RE | Admit: 2024-07-31 | Discharge: 2024-07-31 | Disposition: A | Discharge status: Home / Self Care | Source: Ambulatory Visit | Attending: General Surgery | Admitting: General Surgery

## 2024-07-31 DIAGNOSIS — R1084 Generalized abdominal pain: Secondary | ICD-10-CM

## 2024-07-31 DIAGNOSIS — R109 Unspecified abdominal pain: Secondary | ICD-10-CM | POA: Diagnosis not present

## 2024-07-31 MED ORDER — IOPAMIDOL (ISOVUE-370) INJECTION 76%
80.0000 mL | Freq: Once | INTRAVENOUS | Status: AC | PRN
  Administered 2024-07-31: 80 mL via INTRAVENOUS

## 2024-08-01 ENCOUNTER — Telehealth: Payer: Self-pay

## 2024-08-02 ENCOUNTER — Telehealth: Payer: Self-pay

## 2024-08-02 NOTE — Patient Instructions (Signed)
 Visit Information  Thank you for taking time to visit with me today. Please don't hesitate to contact me if I can be of assistance to you before our next scheduled telephone appointment.  Our next appointment is by telephone on 08/09/2024 at 1pm  Following is a copy of your care plan:   Goals Addressed             This Visit's Progress    [facility] Transitions of Care (TOC) Care Plan       Problems:  Recent Hospitalization for treatment of Laparotomy with LOA, small bowel resection, repair of parastomal hernia and revision of descending colostomy  07/19/2024  Reports that he is having severe pain. Reports that he was originally prescribed oxycodone  10-15 mg every 6 hours.  Patient reports that he ran out and new RX sent in for Oxycodone  5mg  every 6 hours. Patient reports that this dose of pain medication is not controlling his pain 8/10. 07/26/2024  Patient reports that he is till having pain 7/10, reports taking his medications as prescribed.  Reports that he is still has his wound vac on and continues to have ostomy problems.  ( Reports difficulty getting ostomy to stay attached)  08/02/2024  Patient reports that he continues to have problems with ostomy bag staying attached.  Reports that he thinks it is his body.  Reports he has had this problem when he first had an ostomy in 2021.  Reviewed and offered trying to get him into the ostomy clinic and he declined.  No Specialist appointment Patient has contact number for Dr. Curvin and will call for post op follow up  07/19/2024  Surgeon follow up scheduled for 07/26/2024  per patient report.  07/26/2024  Has surgeon follow today.  08/02/2024  Patient will see surgeon again in 1 month. Had a CT yesterday. Results pending.  Nausea. 07/19/2024  Reports nausea and decrease in appetite. 07/26/2024  Denies nausea today and reports increased appetite. 08/02/2024- no nausea at this time Undigested pills- 07/19/2024  patient reports that he is seeing whole pills  in his stool. 07/26/2024  patient discussed with surgeon today and will follow up with PCP. 08/02/2024  Patient denies seeing any undigested pills at this time.  Uncontrolled pain.  07/19/2024 patient reports uncontrolled pain.  07/26/2024  reports pain decreased and under better management. 08/02/2024  Reports pain is a 6/10. Reports increased pain with bending over to pick up things.  States he is out of pain medications and is using tylenol . States he has 1 gabapentin  left.  Difficulty with wound vac,   07/26/2024 Reviewed difficulties with wound vac.  Reports home health nurse states wound vac will come off in about a week.  08/02/2024  Still has wound vac. Home health nurse comes tomorrow and he is hoping to be done with wound vac.   Goal:  Over the next 30 days, the patient will not experience hospital readmission  Interventions:  Transitions of Care: Durable Medical Equipment (DME) reviewed with patient. Encouraged patient to call home health nurse when he is having problems with wound vac.  Again encouraged patient to seek help with home health nurse.  Reviewed continued struggle with wound vac.  Doctor Visits  - discussed the importance of doctor visits.- reviewed upcoming PCP and surgeon appointments  Reviewed and reassessed pain.  6/10 today. Taking tylenol  Re Assessed nausea- no nausea today Reviewed with patient that his wife is working on his ostomy supplies and his GI follow up- confirmed  that PCP has ordered ostomy supplies and patient states that he has everything he needs.   Patient Self Care Activities:  Attend all scheduled provider appointments Call provider office for new concerns or questions  Notify RN Care Manager of TOC call rescheduling needs Participate in Transition of Care Program/Attend TOC scheduled calls Take medications as prescribed   Follow directions from PCP and specialist from recent appointments Call pharmacy if you need refills on medications Try to  continue to be active. Eat a balanced diet.    Plan:  Telephone follow up appointment with care management team member scheduled for:  08/09/2024 at 1 pm am with Alan Ee  RN        Patient verbalizes understanding of instructions and care plan provided today and agrees to view in MyChart. Active MyChart status and patient understanding of how to access instructions and care plan via MyChart confirmed with patient.     Telephone follow up appointment with care management team member scheduled for:  08/09/2024  at 1pm  Please call the care guide team at [phone] if you need to cancel or reschedule your appointment.   Please call the Suicide and Crisis Lifeline: 988 call the [place]  National Suicide Prevention Lifeline: (330) 385-6055 or TTY: 347 526 9421 TTY (873)430-4232) to talk to a trained counselor call 1-800-273-TALK (toll free, 24 hour hotline) call 911 if you are experiencing a Mental Health or Behavioral Health Crisis or need someone to talk to.  Alan Ee, RN, BSN, CEN [employer]- Transition of Care Team.  [facility] [phone]

## 2024-08-02 NOTE — Transitions of Care (Post Inpatient/ED Visit) (Signed)
 Transition of Care week 4  Visit Note  08/02/2024  Name: Jordan [name] MRN: [id number]          DOB: Mar 07, 1981  Situation: Patient enrolled in [facility] 30-day program. Visit completed with patient by telephone.   Background:   Initial Transition Care Management Follow-up Telephone Call    Past Medical History:  Diagnosis Date   Anxiety and depression    Asthma    Chronic pain syndrome    GERD (gastroesophageal reflux disease)    History of kidney stones    HYPERTENSION, MILD    OSTEOARTHRITIS    Recurrent UTI    Sleep apnea     Assessment: Patient reports that he thinks he is getting better slowly. Continues to have difficulty with ostomy bag staying on and with wound vac. Home health nurse is coming tomorrow.  Pain 6/10 Patient Reported Symptoms: Cognitive Cognitive Status: Able to follow simple commands, Alert and oriented to person, place, and time, Normal speech and language skills      Neurological Neurological Review of Symptoms: No symptoms reported    HEENT HEENT Symptoms Reported: No symptoms reported      Cardiovascular Cardiovascular Symptoms Reported: No symptoms reported    Respiratory Respiratory Symptoms Reported: No symptoms reported    Endocrine Endocrine Symptoms Reported: No symptoms reported Is patient diabetic?: No    Gastrointestinal Gastrointestinal Symptoms Reported: Abdominal pain or discomfort Additional Gastrointestinal Details: Continues to have abd pain. especially when bending over to pick up something.  Continues to have difficulty with ostomy wafer and bag staying on.  Still has on wound vac. Gastrointestinal Management Strategies: Medication therapy Gastrointestinal Self-Management Outcome: 4 (good)    Genitourinary Genitourinary Symptoms Reported: No symptoms reported    Integumentary Integumentary Symptoms Reported: Other, Wound Other Integumentary Symptoms: Wound vac still in place. Home health coming tomorrow to  assess wound and ostomy Skin Management Strategies: Medication therapy Skin Self-Management Outcome: 3 (uncertain)  Musculoskeletal Musculoskelatal Symptoms Reviewed: No symptoms reported        Psychosocial Psychosocial Symptoms Reported: No symptoms reported         Today's Vitals   08/02/24 1201  PainSc: 6      Medications Reviewed Today     Reviewed by Rumalda Alan PENNER, RN (Registered Nurse) on 08/02/24 at 1144  Med List Status: <None>   Medication Order Taking? Sig Documenting Provider Last Dose Status Informant  acetaminophen  (TYLENOL ) 500 MG tablet [id number] Yes Take 1,000 mg by mouth every 6 (six) hours as needed for mild pain (pain score 1-3) or moderate pain (pain score 4-6). [provider]  Active   albuterol  (VENTOLIN  HFA) 108 (90 Base) MCG/ACT inhaler [id number] Yes Inhale 2 puffs into the lungs every 6 (six) hours as needed for wheezing or shortness of breath. Wallace Joesph LABOR, PA  Active Self, Spouse/Significant Other, Pharmacy Records  cyclobenzaprine  (FLEXERIL ) 10 MG tablet [id number] Yes Take 1 tablet (10 mg total) by mouth 3 (three) times daily as needed for muscle spasms. [name], MD  Active   gabapentin  (NEURONTIN ) 300 MG capsule [id number] Yes Take 1 capsule (300 mg total) by mouth 3 (three) times daily. Debby Hila, MD  Active   ibuprofen  (ADVIL ) 200 MG tablet [id number]  Take 800 mg by mouth 2 (two) times daily as needed for moderate pain (pain score 4-6).  Patient not taking: Reported on 08/02/2024   [provider]  Active Self, Spouse/Significant Other, Pharmacy Records  omeprazole (PRILOSEC OTC) 20 MG tablet  [id number] Yes Take 20 mg by mouth daily as needed (heartburn). [provider]  Active Self, Spouse/Significant Other, Pharmacy Records  ondansetron  (ZOFRAN -ODT) 4 MG disintegrating tablet [id number] Yes Take 1 tablet (4 mg total) by mouth every 8 (eight) hours as needed for nausea or vomiting. [name], MD  Active    polyethylene glycol (MIRALAX  / GLYCOLAX ) 17 g packet [id number] Yes Take 17 g by mouth daily as needed for mild constipation. Debby Hila, MD  Active             Recommendation:   Continue Current Plan of Care  Follow Up Plan:   Telephone follow up appointment date/time:  08/09/2024   at 1 pm week 5  Alan Ee, RN, BSN, [employer]- Transition of Care Team.  [facility] [employer] [phone]

## 2024-08-07 ENCOUNTER — Telehealth: Payer: Self-pay | Admitting: Family Medicine

## 2024-08-07 ENCOUNTER — Ambulatory Visit: Payer: Self-pay | Admitting: General Surgery

## 2024-08-07 DIAGNOSIS — K5790 Diverticulosis of intestine, part unspecified, without perforation or abscess without bleeding: Secondary | ICD-10-CM | POA: Diagnosis not present

## 2024-08-07 DIAGNOSIS — Z433 Encounter for attention to colostomy: Secondary | ICD-10-CM | POA: Diagnosis not present

## 2024-08-07 DIAGNOSIS — E872 Acidosis, unspecified: Secondary | ICD-10-CM | POA: Diagnosis not present

## 2024-08-07 DIAGNOSIS — F419 Anxiety disorder, unspecified: Secondary | ICD-10-CM | POA: Diagnosis not present

## 2024-08-07 DIAGNOSIS — I10 Essential (primary) hypertension: Secondary | ICD-10-CM | POA: Diagnosis not present

## 2024-08-07 DIAGNOSIS — Z48815 Encounter for surgical aftercare following surgery on the digestive system: Secondary | ICD-10-CM | POA: Diagnosis not present

## 2024-08-07 DIAGNOSIS — G4733 Obstructive sleep apnea (adult) (pediatric): Secondary | ICD-10-CM | POA: Diagnosis not present

## 2024-08-07 DIAGNOSIS — F32A Depression, unspecified: Secondary | ICD-10-CM | POA: Diagnosis not present

## 2024-08-07 DIAGNOSIS — J45909 Unspecified asthma, uncomplicated: Secondary | ICD-10-CM | POA: Diagnosis not present

## 2024-08-07 NOTE — Telephone Encounter (Signed)
 Copied from CRM [id number]. Topic: Clinical - Medication Refill >> Aug 07, 2024  4:21 PM Fonda T wrote: Medication: Oxycodone  HCl 10 MG TABS  Has the patient contacted their pharmacy? Yes, advised to contact office for refill   This is the patient's preferred pharmacy:  [facility] - [place], [place] - [address] [place] [address] Phone: [phone] Fax: [phone]   Is this the correct pharmacy for this prescription? Yes If no, delete pharmacy and type the correct one.   Has the prescription been filled recently? Yes  Is the patient out of the medication? Yes  Has the patient been seen for an appointment in the last year OR does the patient have an upcoming appointment? Yes  Can we respond through MyChart? No, prefers a phone call [phone]  Agent: Please be advised that Rx refills may take up to 3 business days. We ask that you follow-up with your pharmacy.

## 2024-08-08 ENCOUNTER — Other Ambulatory Visit: Payer: Self-pay | Admitting: Family Medicine

## 2024-08-08 MED ORDER — OXYCODONE HCL 10 MG PO TABS: 10.0000 mg | ORAL_TABLET | Freq: Four times a day (QID) | ORAL | 0 refills | Status: DC | PRN

## 2024-08-08 NOTE — Telephone Encounter (Signed)
 Oxycodone  rx has been sent in

## 2024-08-09 ENCOUNTER — Other Ambulatory Visit: Payer: Self-pay

## 2024-08-09 DIAGNOSIS — Z933 Colostomy status: Secondary | ICD-10-CM

## 2024-08-09 DIAGNOSIS — Z8719 Personal history of other diseases of the digestive system: Secondary | ICD-10-CM

## 2024-08-09 DIAGNOSIS — G894 Chronic pain syndrome: Secondary | ICD-10-CM

## 2024-08-09 DIAGNOSIS — K56609 Unspecified intestinal obstruction, unspecified as to partial versus complete obstruction: Secondary | ICD-10-CM

## 2024-08-09 DIAGNOSIS — R7303 Prediabetes: Secondary | ICD-10-CM

## 2024-08-09 NOTE — Patient Instructions (Signed)
 Visit Information  Thank you for taking time to visit with me today. t.    Following is a copy of your care plan:   Goals Addressed             This Visit's Progress    COMPLETED: [facility]I Transitions of Care (TOC) Care Plan       Problems:  Recent Hospitalization for treatment of Laparotomy with LOA, small bowel resection, repair of parastomal hernia and revision of descending colostomy  07/19/2024  Reports that he is having severe pain. Reports that he was originally prescribed oxycodone  10-15 mg every 6 hours.  Patient reports that he ran out and new RX sent in for Oxycodone  5mg  every 6 hours. Patient reports that this dose of pain medication is not controlling his pain 8/10. 07/26/2024  Patient reports that he is till having pain 7/10, reports taking his medications as prescribed.  Reports that he is still has his wound vac on and continues to have ostomy problems.  ( Reports difficulty getting ostomy to stay attached)  08/02/2024  Patient reports that he continues to have problems with ostomy bag staying attached.  Reports that he thinks it is his body.  Reports he has had this problem when he first had an ostomy in 2021.  Reviewed and offered trying to get him into the ostomy clinic and he declined. 08/09/2024  Patient reports that he is doing ok. States that he still has his wound vac for about another week. Reports his ostomy is healing and his appliances are staying on better.  Continues to be active with home health. Has follow up planned with PCP and surgeon. Reports pain is under control with gabapentin , muscle relaxer, tylenol  and oxycodone .  Pain level today of 5/10 No Specialist appointment Patient has contact number for Dr. Curvin and will call for post op follow up  07/19/2024  Surgeon follow up scheduled for 07/26/2024  per patient report.  07/26/2024  Has surgeon follow today.  08/02/2024  Patient will see surgeon again in 1 month. Had a CT yesterday. Results pending. 08/09/2024 Has  follow up planned with PCP and specialist. Nausea. 07/19/2024  Reports nausea and decrease in appetite. 07/26/2024  Denies nausea today and reports increased appetite. 08/09/2024- no nausea at this time Undigested pills- 07/19/2024  patient reports that he is seeing whole pills in his stool. 07/26/2024  patient discussed with surgeon today and will follow up with PCP. 08/09/2024  Patient denies seeing any undigested pills at this time.  Uncontrolled pain.  07/19/2024 patient reports uncontrolled pain.  07/26/2024  reports pain decreased and under better management. 08/09/2024  Reports pain under better control at this time 5/10.  Difficulty with wound vac,   07/26/2024 Reviewed difficulties with wound vac.  Reports home health nurse states wound vac will come off in about a week.  08/02/2024  Still has wound vac. Home health nurse comes tomorrow and he is hoping to be done with wound vac. 08/09/2024  Still has wound vac for about another week. Reports things are better with wound vac and stoma devices at this time.   Goal:  Over the next 30 days, the patient will not experience hospital readmission  Interventions:  Transitions of Care: Assessed wound vac and stoma appliance is working better Doctor Visits  - discussed the importance of doctor visits.- reviewed upcoming PCP and surgeon appointments  Reviewed and reassessed pain.  5/10 today. Taking tylenol , gabapentin , oxycodone  and muscle relaxer Re Assessed nausea- no nausea  today Confirmed patient has ostomy supplies Reviewed with patient that he has completed TOC program without a readmission. Reviewed CCM program and patient has agreed to continue with CCM- order placed.   Patient Self Care Activities:  Attend all scheduled provider appointments Call provider office for new concerns or questions  Take medications as prescribed   Follow directions from PCP and specialist from recent appointments Call pharmacy if you need refills on  medications Try to continue to be active. Eat a balanced diet.  Expect call from a care guide to set up an appointment with new case manager.   Plan:  Completed program.  Referral placed for CCM        Patient verbalizes understanding of instructions and care plan provided today and agrees to view in MyChart. Active MyChart status and patient understanding of how to access instructions and care plan via MyChart confirmed with patient.     A care guide will call you and schedule an appointment with a complex case manager  Please call the care guide team at [phone] if you need to cancel or reschedule your appointment.   Please call the Suicide and Crisis Lifeline: 988 call the [place]  National Suicide Prevention Lifeline: 458-594-5264 or TTY: 581-605-0705 TTY 435-175-3571) to talk to a trained counselor call 1-800-273-TALK (toll free, 24 hour hotline) go to [facility] [address] [place] [phone]) call 911 if you are experiencing a Mental Health or Behavioral Health Crisis or need someone to talk to.  Alan Ee, RN, BSN, CEN [employer]- Transition of Care Team.  [facility] [phone]

## 2024-08-09 NOTE — Transitions of Care (Post Inpatient/ED Visit) (Signed)
 Transition of Care week 5  Visit Note  08/09/2024  Name: Jordan [name] MRN: [id number]          DOB: November 03, 1981  Situation: Patient enrolled in [facility] 30-day program. Visit completed with pateint by telephone.   Background:   Initial Transition Care Management Follow-up Telephone Call    Past Medical History:  Diagnosis Date   Anxiety and depression    Asthma    Chronic pain syndrome    GERD (gastroesophageal reflux disease)    History of kidney stones    HYPERTENSION, MILD    OSTEOARTHRITIS    Recurrent UTI    Sleep apnea     Assessment: Doing better. Reports that he continues to have stoma and wound vac pain but it is under better control. Reports that he is still active with home health nurse. Reports taking all medications as prescribed, Has follow up appointments pending with PCP and surgeon. Patient has completed TOC program successfully and has consented to Complex Care Management.  Patient Reported Symptoms: Cognitive Cognitive Status: Able to follow simple commands, Alert and oriented to person, place, and time, Normal speech and language skills      Neurological Neurological Review of Symptoms: No symptoms reported    HEENT HEENT Symptoms Reported: No symptoms reported      Cardiovascular Cardiovascular Symptoms Reported: No symptoms reported    Respiratory Respiratory Symptoms Reported: No symptoms reported    Endocrine Endocrine Symptoms Reported: No symptoms reported    Gastrointestinal Gastrointestinal Symptoms Reported: Abdominal pain or discomfort Additional Gastrointestinal Details: Report that he continues to have pain at the site of wound vac and new stoma. Reports no stool today but has taken his miralax .  States wound vac is still on for about another week. Reports stoma has healed enough that he is able to use his old supplies. Gastrointestinal Management Strategies: Medication therapy Gastrointestinal Self-Management Outcome: 4  (good)    Genitourinary Genitourinary Symptoms Reported: No symptoms reported    Integumentary Integumentary Symptoms Reported: Other Other Integumentary Symptoms: still has wound vac. Reports healing of stoma    Musculoskeletal Musculoskelatal Symptoms Reviewed: No symptoms reported        Psychosocial Psychosocial Symptoms Reported: No symptoms reported         Today's Vitals   08/09/24 1402  PainSc: 5   PainLoc: Abdomen     Medications Reviewed Today     Reviewed by Rumalda Alan PENNER, RN (Registered Nurse) on 08/09/24 at 1350  Med List Status: <None>   Medication Order Taking? Sig Documenting Provider Last Dose Status Informant  acetaminophen  (TYLENOL ) 500 MG tablet [id number] Yes Take 1,000 mg by mouth every 6 (six) hours as needed for mild pain (pain score 1-3) or moderate pain (pain score 4-6). [provider]  Active   albuterol  (VENTOLIN  HFA) 108 (90 Base) MCG/ACT inhaler [id number] Yes Inhale 2 puffs into the lungs every 6 (six) hours as needed for wheezing or shortness of breath. Wallace Joesph LABOR, PA  Active Self, Spouse/Significant Other, Pharmacy Records  cyclobenzaprine  (FLEXERIL ) 10 MG tablet [id number] Yes Take 1 tablet (10 mg total) by mouth 3 (three) times daily as needed for muscle spasms. [name], MD  Active   gabapentin  (NEURONTIN ) 300 MG capsule [id number] Yes Take 1 capsule (300 mg total) by mouth 3 (three) times daily. Debby Hila, MD  Active   ibuprofen  (ADVIL ) 200 MG tablet [id number] Yes Take 800 mg by mouth 2 (two) times daily as needed for moderate pain (  pain score 4-6). [provider]  Active Self, Spouse/Significant Other, Pharmacy Records  omeprazole (PRILOSEC OTC) 20 MG tablet [id number] Yes Take 20 mg by mouth daily as needed (heartburn). [provider]  Active Self, Spouse/Significant Other, Pharmacy Records  ondansetron  (ZOFRAN -ODT) 4 MG disintegrating tablet [id number] Yes Take 1 tablet (4 mg total) by mouth every 8  (eight) hours as needed for nausea or vomiting. [name], MD  Active   Oxycodone  HCl 10 MG TABS [id number] Yes Take 1 tablet (10 mg total) by mouth every 6 (six) hours as needed for up to 5 days. [name], MD  Active   polyethylene glycol (MIRALAX  / GLYCOLAX ) 17 g packet [id number] Yes Take 17 g by mouth daily as needed for mild constipation. Debby Hila, MD  Active             Goals Addressed             This Visit's Progress    COMPLETED: [facility]I Transitions of Care (TOC) Care Plan       Problems:  Recent Hospitalization for treatment of Laparotomy with LOA, small bowel resection, repair of parastomal hernia and revision of descending colostomy  07/19/2024  Reports that he is having severe pain. Reports that he was originally prescribed oxycodone  10-15 mg every 6 hours.  Patient reports that he ran out and new RX sent in for Oxycodone  5mg  every 6 hours. Patient reports that this dose of pain medication is not controlling his pain 8/10. 07/26/2024  Patient reports that he is till having pain 7/10, reports taking his medications as prescribed.  Reports that he is still has his wound vac on and continues to have ostomy problems.  ( Reports difficulty getting ostomy to stay attached)  08/02/2024  Patient reports that he continues to have problems with ostomy bag staying attached.  Reports that he thinks it is his body.  Reports he has had this problem when he first had an ostomy in 2021.  Reviewed and offered trying to get him into the ostomy clinic and he declined. 08/09/2024  Patient reports that he is doing ok. States that he still has his wound vac for about another week. Reports his ostomy is healing and his appliances are staying on better.  Continues to be active with home health. Has follow up planned with PCP and surgeon. Reports pain is under control with gabapentin , muscle relaxer, tylenol  and oxycodone .  Pain level today of 5/10 No Specialist appointment Patient has contact  number for Dr. Curvin and will call for post op follow up  07/19/2024  Surgeon follow up scheduled for 07/26/2024  per patient report.  07/26/2024  Has surgeon follow today.  08/02/2024  Patient will see surgeon again in 1 month. Had a CT yesterday. Results pending. 08/09/2024 Has follow up planned with PCP and specialist. Nausea. 07/19/2024  Reports nausea and decrease in appetite. 07/26/2024  Denies nausea today and reports increased appetite. 08/09/2024- no nausea at this time Undigested pills- 07/19/2024  patient reports that he is seeing whole pills in his stool. 07/26/2024  patient discussed with surgeon today and will follow up with PCP. 08/09/2024  Patient denies seeing any undigested pills at this time.  Uncontrolled pain.  07/19/2024 patient reports uncontrolled pain.  07/26/2024  reports pain decreased and under better management. 08/09/2024  Reports pain under better control at this time 5/10.  Difficulty with wound vac,   07/26/2024 Reviewed difficulties with wound vac.  Reports home health nurse  states wound vac will come off in about a week.  08/02/2024  Still has wound vac. Home health nurse comes tomorrow and he is hoping to be done with wound vac. 08/09/2024  Still has wound vac for about another week. Reports things are better with wound vac and stoma devices at this time.   Goal:  Over the next 30 days, the patient will not experience hospital readmission  Interventions:  Transitions of Care: Assessed wound vac and stoma appliance is working better Doctor Visits  - discussed the importance of doctor visits.- reviewed upcoming PCP and surgeon appointments  Reviewed and reassessed pain.  5/10 today. Taking tylenol , gabapentin , oxycodone  and muscle relaxer Re Assessed nausea- no nausea today Confirmed patient has ostomy supplies Reviewed with patient that he has completed TOC program without a readmission. Reviewed CCM program and patient has agreed to continue with CCM- order placed.    Patient Self Care Activities:  Attend all scheduled provider appointments Call provider office for new concerns or questions  Take medications as prescribed   Follow directions from PCP and specialist from recent appointments Call pharmacy if you need refills on medications Try to continue to be active. Eat a balanced diet.  Expect call from a care guide to set up an appointment with new case manager.   Plan:  Completed program.  Referral placed for CCM         Recommendation:   Continue Current Plan of Care  Follow Up Plan:   Closing From:  Transitions of Care Program  Alan Ee, RN, BSN, [employer] [facility]- Transition of Care Team.  [facility] [phone]

## 2024-08-10 ENCOUNTER — Telehealth: Payer: Self-pay | Admitting: *Deleted

## 2024-08-10 DIAGNOSIS — I10 Essential (primary) hypertension: Secondary | ICD-10-CM | POA: Diagnosis not present

## 2024-08-10 DIAGNOSIS — E872 Acidosis, unspecified: Secondary | ICD-10-CM | POA: Diagnosis not present

## 2024-08-10 DIAGNOSIS — J45909 Unspecified asthma, uncomplicated: Secondary | ICD-10-CM | POA: Diagnosis not present

## 2024-08-10 DIAGNOSIS — F419 Anxiety disorder, unspecified: Secondary | ICD-10-CM | POA: Diagnosis not present

## 2024-08-10 DIAGNOSIS — Z48815 Encounter for surgical aftercare following surgery on the digestive system: Secondary | ICD-10-CM | POA: Diagnosis not present

## 2024-08-10 DIAGNOSIS — K5790 Diverticulosis of intestine, part unspecified, without perforation or abscess without bleeding: Secondary | ICD-10-CM | POA: Diagnosis not present

## 2024-08-10 DIAGNOSIS — G4733 Obstructive sleep apnea (adult) (pediatric): Secondary | ICD-10-CM | POA: Diagnosis not present

## 2024-08-10 DIAGNOSIS — F32A Depression, unspecified: Secondary | ICD-10-CM | POA: Diagnosis not present

## 2024-08-10 DIAGNOSIS — Z433 Encounter for attention to colostomy: Secondary | ICD-10-CM | POA: Diagnosis not present

## 2024-08-10 NOTE — Progress Notes (Signed)
 Complex Care Management Note  Care Guide Note 08/10/2024 Name: Jordan [name] MRN: [id number] DOB: 1981-06-07  Jordan [name] is a 43 y.o. year old male who sees [name], MD for primary care. I reached out to [name] by phone today to offer complex care management services.  [name] was given information about Complex Care Management services today including:   The Complex Care Management services include support from the care team which includes your Nurse Care Manager, Clinical Social Worker, or Pharmacist.  The Complex Care Management team is here to help remove barriers to the health concerns and goals most important to you. Complex Care Management services are voluntary, and the patient may decline or stop services at any time by request to their care team member.   Complex Care Management Consent Status: Patient agreed to services and verbal consent obtained.   Follow up plan:  Telephone appointment with complex care management team member scheduled for:  08/20/2024  Encounter Outcome:  Patient Scheduled  Thedford Franks, CMA [place]  [facility], [facility] Guide Direct Dial: [phone]  Fax: [phone] Website: [email or web address]

## 2024-08-14 DIAGNOSIS — F419 Anxiety disorder, unspecified: Secondary | ICD-10-CM | POA: Diagnosis not present

## 2024-08-14 DIAGNOSIS — Z48815 Encounter for surgical aftercare following surgery on the digestive system: Secondary | ICD-10-CM | POA: Diagnosis not present

## 2024-08-14 DIAGNOSIS — J45909 Unspecified asthma, uncomplicated: Secondary | ICD-10-CM | POA: Diagnosis not present

## 2024-08-14 DIAGNOSIS — Z433 Encounter for attention to colostomy: Secondary | ICD-10-CM | POA: Diagnosis not present

## 2024-08-14 DIAGNOSIS — F32A Depression, unspecified: Secondary | ICD-10-CM | POA: Diagnosis not present

## 2024-08-14 DIAGNOSIS — K5790 Diverticulosis of intestine, part unspecified, without perforation or abscess without bleeding: Secondary | ICD-10-CM | POA: Diagnosis not present

## 2024-08-14 DIAGNOSIS — E872 Acidosis, unspecified: Secondary | ICD-10-CM | POA: Diagnosis not present

## 2024-08-14 DIAGNOSIS — G4733 Obstructive sleep apnea (adult) (pediatric): Secondary | ICD-10-CM | POA: Diagnosis not present

## 2024-08-14 DIAGNOSIS — I10 Essential (primary) hypertension: Secondary | ICD-10-CM | POA: Diagnosis not present

## 2024-08-15 ENCOUNTER — Other Ambulatory Visit: Payer: Self-pay | Admitting: Family Medicine

## 2024-08-17 ENCOUNTER — Other Ambulatory Visit: Payer: Self-pay | Admitting: Family Medicine

## 2024-08-17 DIAGNOSIS — Z48815 Encounter for surgical aftercare following surgery on the digestive system: Secondary | ICD-10-CM | POA: Diagnosis not present

## 2024-08-17 DIAGNOSIS — Z433 Encounter for attention to colostomy: Secondary | ICD-10-CM | POA: Diagnosis not present

## 2024-08-17 DIAGNOSIS — I10 Essential (primary) hypertension: Secondary | ICD-10-CM | POA: Diagnosis not present

## 2024-08-17 DIAGNOSIS — J45909 Unspecified asthma, uncomplicated: Secondary | ICD-10-CM | POA: Diagnosis not present

## 2024-08-17 DIAGNOSIS — F419 Anxiety disorder, unspecified: Secondary | ICD-10-CM | POA: Diagnosis not present

## 2024-08-17 DIAGNOSIS — G4733 Obstructive sleep apnea (adult) (pediatric): Secondary | ICD-10-CM | POA: Diagnosis not present

## 2024-08-17 DIAGNOSIS — E872 Acidosis, unspecified: Secondary | ICD-10-CM | POA: Diagnosis not present

## 2024-08-17 DIAGNOSIS — F32A Depression, unspecified: Secondary | ICD-10-CM | POA: Diagnosis not present

## 2024-08-17 DIAGNOSIS — K5790 Diverticulosis of intestine, part unspecified, without perforation or abscess without bleeding: Secondary | ICD-10-CM | POA: Diagnosis not present

## 2024-08-17 NOTE — Telephone Encounter (Signed)
 Copied from CRM #[id number]. Topic: Clinical - Medication Refill >> Aug 17, 2024  2:20 PM Winona R wrote: Medication: Oxycodone  HCl 10 MG TABS cyclobenzaprine  (FLEXERIL ) 10 MG tablet gabapentin  (NEURONTIN ) 300 MG capsule  Has the patient contacted their pharmacy? Yes but stated they will contact the pcp  (Agent: If no, request that the patient contact the pharmacy for the refill. If patient does not wish to contact the pharmacy document the reason why and proceed with request.) (Agent: If yes, when and what did the pharmacy advise?)  This is the patient's preferred pharmacy:  [facility] - [place], [place] - [address] [place] [address] Phone: [phone] Fax: [phone]   Is this the correct pharmacy for this prescription? Yes If no, delete pharmacy and type the correct one.   Has the prescription been filled recently? No  Is the patient out of the medication? yes  Has the patient been seen for an appointment in the last year OR does the patient have an upcoming appointment? Yes  Can we respond through MyChart? Yes  Agent: Please be advised that Rx refills may take up to 3 business days. We ask that you follow-up with your pharmacy.

## 2024-08-20 ENCOUNTER — Other Ambulatory Visit: Payer: Self-pay

## 2024-08-21 NOTE — Patient Outreach (Signed)
 Complex Care Management   Visit Note  08/20/2024  Name:  Jordan [name] MRN: [id number] DOB: [birth year]  Situation: Referral received for Complex Care Management related to recent hospitalization for SBO and need for revision of existing colostomy. I obtained verbal consent from Patient.  Visit completed with Patient  on the phone  Background:   Past Medical History:  Diagnosis Date   Anxiety and depression    Asthma    Chronic pain syndrome    GERD (gastroesophageal reflux disease)    History of kidney stones    HYPERTENSION, MILD    OSTEOARTHRITIS    Recurrent UTI    Sleep apnea     Assessment: Patient Reported Symptoms:  Cognitive Cognitive Status: Alert and oriented to person, place, and time, Normal speech and language skills, Able to follow simple commands Cognitive/Intellectual Conditions Management [RPT]: None reported or documented in medical history or problem list   Health Maintenance Behaviors: Annual physical exam Healing Pattern: Unsure Health Facilitated by: Pain control  Neurological Neurological Review of Symptoms: No symptoms reported    HEENT HEENT Symptoms Reported: No symptoms reported      Cardiovascular Cardiovascular Symptoms Reported: No symptoms reported    Respiratory Respiratory Symptoms Reported: No symptoms reported    Endocrine Endocrine Symptoms Reported: No symptoms reported Is patient diabetic?: No    Gastrointestinal Gastrointestinal Symptoms Reported: Abdominal pain or discomfort Additional Gastrointestinal Details: Patient reports wound vac was discontinued to new stoma site about a week ago. States pain has lessened since discontinuance of wound vac. pain level about a 3. Reports Home Health Nurse still visiting weekly and assessing patient's progress. Reports is compliant with meds including taking Miralax  daily, and stool present today. Also, states stoma has healed enough so that he can use his old colostomy  supplies. Gastrointestinal Management Strategies: Medication therapy, Colostomy, Coping strategies Gastrointestinal Self-Management Outcome: 4 (good)    Genitourinary Genitourinary Symptoms Reported: No symptoms reported    Integumentary Integumentary Symptoms Reported: Other Other Integumentary Symptoms: Wound vac was discontinued last week. Patient reported new stoma has healed enough so that he can use his previous colostomy supplies. Skin Management Strategies: Medication therapy, Coping strategies, Medical device, Dressing changes Skin Self-Management Outcome: 4 (good)  Musculoskeletal Musculoskelatal Symptoms Reviewed: No symptoms reported   Falls in the past year?: No    Psychosocial Psychosocial Symptoms Reported: No symptoms reported     Quality of Family Relationships: involved, supportive, helpful Do you feel physically threatened by others?: No    08/21/2024    PHQ2-9 Depression Screening   Little interest or pleasure in doing things Not at all  Feeling down, depressed, or hopeless Not at all  PHQ-2 - Total Score 0  Trouble falling or staying asleep, or sleeping too much    Feeling tired or having little energy    Poor appetite or overeating     Feeling bad about yourself - or that you are a failure or have let yourself or your family down    Trouble concentrating on things, such as reading the newspaper or watching television    Moving or speaking so slowly that other people could have noticed.  Or the opposite - being so fidgety or restless that you have been moving around a lot more than usual    Thoughts that you would be better off dead, or hurting yourself in some way    PHQ2-9 Total Score    If you checked off any problems, how difficult have these  problems made it for you to do your work, take care of things at home, or get along with other people    Depression Interventions/Treatment      There were no vitals filed for this visit.  Medications Reviewed  Today     Reviewed by Gordy Channing LABOR, RN (Registered Nurse) on 08/20/24 at 1435  Med List Status: <None>   Medication Order Taking? Sig Documenting Provider Last Dose Status Informant  acetaminophen  (TYLENOL ) 500 MG tablet [id number] Yes Take 1,000 mg by mouth every 6 (six) hours as needed for mild pain (pain score 1-3) or moderate pain (pain score 4-6). [provider]  Active   albuterol  (VENTOLIN  HFA) 108 (90 Base) MCG/ACT inhaler [id number] Yes Inhale 2 puffs into the lungs every 6 (six) hours as needed for wheezing or shortness of breath. Wallace Joesph LABOR, PA  Active Self, Spouse/Significant Other, Pharmacy Records  cyclobenzaprine  (FLEXERIL ) 10 MG tablet [id number] Yes TAKE 1 TABLET (10 MG TOTAL) BY MOUTH 3 (THREE) TIMES DAILY AS NEEDED FOR MUSCLE SPASMS. [name], MD  Active   gabapentin  (NEURONTIN ) 300 MG capsule [id number] Yes TAKE 1 CAPSULE BY MOUTH THREE TIMES A DAY [name], MD  Active   ibuprofen  (ADVIL ) 200 MG tablet [id number] Yes Take 800 mg by mouth 2 (two) times daily as needed for moderate pain (pain score 4-6). [provider]  Active Self, Spouse/Significant Other, Pharmacy Records  omeprazole (PRILOSEC OTC) 20 MG tablet [id number] Yes Take 20 mg by mouth daily as needed (heartburn). [provider]  Active Self, Spouse/Significant Other, Pharmacy Records  ondansetron  (ZOFRAN -ODT) 4 MG disintegrating tablet [id number] Yes Take 1 tablet (4 mg total) by mouth every 8 (eight) hours as needed for nausea or vomiting. [name], MD  Active   Oxycodone  HCl 10 MG TABS [id number] Yes Take 1 tablet (10 mg total) by mouth every 6 (six) hours as needed. [name], MD  Active   polyethylene glycol (MIRALAX  / GLYCOLAX ) 17 g packet [id number] Yes Take 17 g by mouth daily as needed for mild constipation. Debby Hila, MD  Active             Recommendation:   PCP Follow-up with Dr. Toribio Chandra on 09/03/24  Follow Up Plan:   Telephone  follow up appointment date/time:  09/05/24 at 1pm with RN Care Manager Wilsie Kern L.  Jandiel Magallanes A. Gordy RN, BA, [facility], CRRN [place]  [facility] Population Health RN Care Manager Direct Dial: [phone]  Fax: [phone]

## 2024-08-21 NOTE — Patient Instructions (Signed)
 Visit Information  Thank you for taking time to visit with me today. Please don't hesitate to contact me if I can be of assistance to you before our next scheduled telephone appointment.  Our next appointment is by telephone on 09/05/2024 at 1 pm.  Following is a copy of your care plan:   Goals Addressed             This Visit's Progress    VBCI RN Care Plan       Problems:  Chronic Disease Management support and education needs related to recent 06/2024 hospitalization with Small Bowel Obstruction and need for revision of existing colostomy,   Goal: Over the next 3 months the Patient will continue to work with RN Care Manager and/or Social Worker to address care management and care coordination needs related to recent hospitalization w/ SBO and need for revision of existing colostomy as evidenced by adherence to care management team scheduled appointments      Interventions:   Evaluation of current treatment plan related to SBO, Need for Revision to Existing Colostomy, self-management of Colostomy care, and patient's adherence to plan as established by provider. Discussed plans with patient for ongoing care management follow up and provided patient with direct contact information for care management team Evaluation of current treatment plan related to Colostomy care & maintenance and patient's adherence to plan as established by provider Provided education to patient and/or caregiver about advanced directives Provided education to patient re: Colostomy care Reviewed medications with patient and discussed importance of daily use of Miralax  Reviewed scheduled/upcoming provider appointments including 09/03/24 6-week follow up appointment with PCP, Dr. Toribio Slain  Screening for signs and symptoms of depression related to chronic disease state  Assessed social determinant of health barriers  Patient Self-Care Activities:  Attend all scheduled provider appointments Call pharmacy for  medication refills 3-7 days in advance of running out of medications Call provider office for new concerns or questions  Perform all self care activities independently  Perform IADL's (shopping, preparing meals, housekeeping, managing finances) independently Take medications as prescribed    Plan:  The patient has been provided with contact information for the care management team and has been advised to call with any health related questions or concerns.  Next appointment with CCM RN Care manager Channing CROME is scheduled for 09/05/24 at 1pm             Patient verbalizes understanding of instructions and care plan provided today and agrees to view in MyChart. Active MyChart status and patient understanding of how to access instructions and care plan via MyChart confirmed with patient.     The patient has been provided with contact information for the care management team and has been advised to call with any health related questions or concerns.   Please call the care guide team at [phone] if you need to cancel or reschedule your appointment.   Please call 1-800-273-TALK (toll free, 24 hour hotline) if you are experiencing a Mental Health or Behavioral Health Crisis or need someone to talk to.  Kazaria Gaertner A. Gordy RN, BA, [facility], CRRN [place]  [facility] Population Health RN Care Manager Direct Dial: [phone]  Fax: [phone]

## 2024-08-23 DIAGNOSIS — F32A Depression, unspecified: Secondary | ICD-10-CM | POA: Diagnosis not present

## 2024-08-23 DIAGNOSIS — E872 Acidosis, unspecified: Secondary | ICD-10-CM | POA: Diagnosis not present

## 2024-08-23 DIAGNOSIS — F419 Anxiety disorder, unspecified: Secondary | ICD-10-CM | POA: Diagnosis not present

## 2024-08-23 DIAGNOSIS — I10 Essential (primary) hypertension: Secondary | ICD-10-CM | POA: Diagnosis not present

## 2024-08-23 DIAGNOSIS — K5790 Diverticulosis of intestine, part unspecified, without perforation or abscess without bleeding: Secondary | ICD-10-CM | POA: Diagnosis not present

## 2024-08-23 DIAGNOSIS — G4733 Obstructive sleep apnea (adult) (pediatric): Secondary | ICD-10-CM | POA: Diagnosis not present

## 2024-08-23 DIAGNOSIS — J45909 Unspecified asthma, uncomplicated: Secondary | ICD-10-CM | POA: Diagnosis not present

## 2024-08-23 DIAGNOSIS — Z48815 Encounter for surgical aftercare following surgery on the digestive system: Secondary | ICD-10-CM | POA: Diagnosis not present

## 2024-08-23 DIAGNOSIS — Z433 Encounter for attention to colostomy: Secondary | ICD-10-CM | POA: Diagnosis not present

## 2024-08-28 ENCOUNTER — Other Ambulatory Visit: Payer: Self-pay | Admitting: Family Medicine

## 2024-08-29 ENCOUNTER — Other Ambulatory Visit: Payer: Self-pay

## 2024-08-29 ENCOUNTER — Other Ambulatory Visit: Payer: Self-pay | Admitting: Family Medicine

## 2024-08-29 MED ORDER — OXYCODONE HCL 10 MG PO TABS: 10.0000 mg | ORAL_TABLET | Freq: Four times a day (QID) | ORAL | 0 refills | Status: DC | PRN

## 2024-08-29 NOTE — Telephone Encounter (Signed)
 Copied from CRM #[id number]. Topic: Clinical - Medication Refill >> Aug 29, 2024 12:01 PM Edsel HERO wrote: Medication: Oxycodone  HCl 10 MG TABS  This is the patient's preferred pharmacy:  [facility] - [place], [place] - [address] [place] [address] Phone: [phone] Fax: [phone]  Is this the correct pharmacy for this prescription? Yes If no, delete pharmacy and type the correct one.   Has the prescription been filled recently? Yes  Is the patient out of the medication? Yes  Has the patient been seen for an appointment in the last year OR does the patient have an upcoming appointment? Yes  Can we respond through MyChart? Yes

## 2024-08-29 NOTE — Telephone Encounter (Signed)
 08/16/24 20 tabs/0 RF

## 2024-08-30 DIAGNOSIS — G4733 Obstructive sleep apnea (adult) (pediatric): Secondary | ICD-10-CM | POA: Diagnosis not present

## 2024-08-30 DIAGNOSIS — Z48815 Encounter for surgical aftercare following surgery on the digestive system: Secondary | ICD-10-CM | POA: Diagnosis not present

## 2024-08-30 DIAGNOSIS — F419 Anxiety disorder, unspecified: Secondary | ICD-10-CM | POA: Diagnosis not present

## 2024-08-30 DIAGNOSIS — J45909 Unspecified asthma, uncomplicated: Secondary | ICD-10-CM | POA: Diagnosis not present

## 2024-08-30 DIAGNOSIS — Z433 Encounter for attention to colostomy: Secondary | ICD-10-CM | POA: Diagnosis not present

## 2024-08-30 DIAGNOSIS — K5790 Diverticulosis of intestine, part unspecified, without perforation or abscess without bleeding: Secondary | ICD-10-CM | POA: Diagnosis not present

## 2024-08-30 DIAGNOSIS — E872 Acidosis, unspecified: Secondary | ICD-10-CM | POA: Diagnosis not present

## 2024-08-30 DIAGNOSIS — I10 Essential (primary) hypertension: Secondary | ICD-10-CM | POA: Diagnosis not present

## 2024-08-30 DIAGNOSIS — F32A Depression, unspecified: Secondary | ICD-10-CM | POA: Diagnosis not present

## 2024-09-03 ENCOUNTER — Ambulatory Visit: Admitting: Family Medicine

## 2024-09-05 ENCOUNTER — Other Ambulatory Visit: Payer: Self-pay

## 2024-09-05 NOTE — Patient Instructions (Signed)
 Visit Information  Thank you for taking time to visit with me today. Please don't hesitate to contact me if I can be of assistance to you before our next scheduled telephone appointment.  Our next appointment is by telephone on 108/2025 at 1pm  Following is a copy of your care plan:   Goals Addressed             This Visit's Progress    VBCI RN Care Plan       Problems:  Chronic Disease Management support and education needs related to recent 06/2024 hospitalization with Small Bowel Obstruction and need for revision of existing colostomy,   Goal: Over the next 3 months the Patient will continue to work with RN Care Manager and/or Social Worker to address care management and care coordination needs related to recent hospitalization w/ SBO and need for revision of existing colostomy as evidenced by adherence to care management team scheduled appointments      Interventions:   Evaluation of current treatment plan related to SBO, Need for Revision to Existing Colostomy, self-management of Colostomy care, and patient's adherence to plan as established by provider. Discussed plans with patient for ongoing care management follow up and provided patient with direct contact information for care management team Evaluation of current treatment plan related to Colostomy care & maintenance and patient's adherence to plan as established by provider Provided education to patient and/or caregiver about advanced directives Provided education to patient re: Colostomy care Reviewed medications with patient and discussed importance of daily use of Miralax  Reviewed scheduled/upcoming provider appointments including 09/03/24 6-week follow up appointment with PCP, Dr. Toribio Slain  Screening for signs and symptoms of depression related to chronic disease state  Assessed social determinant of health barriers  Patient Self-Care Activities:  Attend all scheduled provider appointments Call pharmacy for  medication refills 3-7 days in advance of running out of medications Call provider office for new concerns or questions  Perform all self care activities independently  Perform IADL's (shopping, preparing meals, housekeeping, managing finances) independently Take medications as prescribed    Plan:  The patient has been provided with contact information for the care management team and has been advised to call with any health related questions or concerns.  Next appointment with CCM RN Care manager Channing CROME is scheduled for 10/03/24 at 1pm             Patient verbalizes understanding of instructions and care plan provided today and agrees to view in MyChart. Active MyChart status and patient understanding of how to access instructions and care plan via MyChart confirmed with patient.     The patient has been provided with contact information for the care management team and has been advised to call with any health related questions or concerns.   Please call the care guide team at [phone] if you need to cancel or reschedule your appointment.   Please call 1-800-273-TALK (toll free, 24 hour hotline) if you are experiencing a Mental Health or Behavioral Health Crisis or need someone to talk to.  Ghislaine Harcum A. Gordy RN, BA, [facility], CRRN [place]  [facility] Population Health RN Care Manager Direct Dial: [phone]  Fax: [phone]

## 2024-09-07 ENCOUNTER — Encounter: Payer: Self-pay | Admitting: Family Medicine

## 2024-09-07 ENCOUNTER — Ambulatory Visit (INDEPENDENT_AMBULATORY_CARE_PROVIDER_SITE_OTHER): Admitting: Family Medicine

## 2024-09-07 VITALS — BP 129/84 | HR 82 | Ht 66.0 in | Wt 270.0 lb

## 2024-09-07 DIAGNOSIS — Z433 Encounter for attention to colostomy: Secondary | ICD-10-CM | POA: Diagnosis not present

## 2024-09-07 DIAGNOSIS — K5790 Diverticulosis of intestine, part unspecified, without perforation or abscess without bleeding: Secondary | ICD-10-CM | POA: Diagnosis not present

## 2024-09-07 DIAGNOSIS — E441 Mild protein-calorie malnutrition: Secondary | ICD-10-CM

## 2024-09-07 DIAGNOSIS — F419 Anxiety disorder, unspecified: Secondary | ICD-10-CM | POA: Diagnosis not present

## 2024-09-07 DIAGNOSIS — I10 Essential (primary) hypertension: Secondary | ICD-10-CM | POA: Diagnosis not present

## 2024-09-07 DIAGNOSIS — Z48815 Encounter for surgical aftercare following surgery on the digestive system: Secondary | ICD-10-CM | POA: Diagnosis not present

## 2024-09-07 DIAGNOSIS — E872 Acidosis, unspecified: Secondary | ICD-10-CM | POA: Diagnosis not present

## 2024-09-07 DIAGNOSIS — J45909 Unspecified asthma, uncomplicated: Secondary | ICD-10-CM | POA: Diagnosis not present

## 2024-09-07 DIAGNOSIS — Z933 Colostomy status: Secondary | ICD-10-CM

## 2024-09-07 DIAGNOSIS — G4733 Obstructive sleep apnea (adult) (pediatric): Secondary | ICD-10-CM | POA: Diagnosis not present

## 2024-09-07 DIAGNOSIS — F32A Depression, unspecified: Secondary | ICD-10-CM | POA: Diagnosis not present

## 2024-09-07 MED ORDER — GABAPENTIN 300 MG PO CAPS: ORAL_CAPSULE | ORAL | 1 refills | Status: DC

## 2024-09-07 MED ORDER — OXYCODONE HCL 10 MG PO TABS: 10.0000 mg | ORAL_TABLET | Freq: Four times a day (QID) | ORAL | 0 refills | Status: DC | PRN

## 2024-09-07 MED ORDER — ONDANSETRON 4 MG PO TBDP: 4.0000 mg | ORAL_TABLET | Freq: Three times a day (TID) | ORAL | 1 refills | Status: DC | PRN

## 2024-09-07 MED ORDER — CYCLOBENZAPRINE HCL 10 MG PO TABS: 10.0000 mg | ORAL_TABLET | Freq: Three times a day (TID) | ORAL | 1 refills | Status: DC | PRN

## 2024-09-07 NOTE — Assessment & Plan Note (Addendum)
 Patient presents for follow-up of chronic abdominal pain after surgery with ostomy. Pain is persistent, particularly with bowel movements through the stoma, described as a burning sensation. Appetite is poor, managed with small, frequent meals. Uses a combination of gabapentin , flexeril , and oxycodone  for pain control. CT scan showed resolution of a previously seen fluid collection - Continue Gabapentin , Flexeril , and oxycodone  as prescribed. Refills sent. - Refill sent for Zofran  PRN nausea. - Discussed management of stool consistency. Advised he could either decrease the liquid mixed with Miralax  to 4 oz or increase to two capfuls in the same amount of liquid to soften stool. Also discussed using chewable Miralax  as an alternative. Patient to adjust based on response. - Advised to discuss persistent abdominal pain, burning with stool passage, and scarring near stoma with his surgeon at the upcoming appointment on 09/19/2024. - Recommended asking surgeon for a referral to a gastroenterologist they work with, for management of absorption issues.

## 2024-09-07 NOTE — Assessment & Plan Note (Signed)
 Patient has issues with malabsorption due to his GI condition and hx of surgeries/ostomy - Labs ordered today: CMP, CBC, ferritin, vitamin B12, vitamin D . Will review results and supplement if deficiencies are noted. Discussed potential for liquid iron if needed due to absorption issues.

## 2024-09-07 NOTE — Patient Instructions (Addendum)
 It was nice to see you today,  We addressed the following topics today: -I have sent in refills of all of your oxycodone  gabapentin  Flexeril  and Zofran . - I would recommend adjusting on your own your MiraLAX  intake.  Either decrease the amount of liquid per capful to 4 ounces or add 2 capfuls to the current 8 ounces, or try the Mira fast chews - I would ask your general surgeon if they would recommend a gastroenterologist to help with management of your gastrointestinal issues and if so if they have a specific provider they would recommend.  Have a great day,  Rolan Slain, MD

## 2024-09-07 NOTE — Progress Notes (Signed)
 Established Patient Office Visit  Subjective   Patient ID: Jordan [name], male    DOB: [birth year]  Age: 43 y.o. MRN: [id number]  Chief Complaint  Patient presents with   Medical Management of Chronic Issues    HPI  Subjective - Follow-up post-surgery. Reports feeling better than last visit. - Abdominal pain persists, which is a concern. Describes pain when passing stool through the stoma, with associated burning sensation before and during exit. This has been a recurring issue since the first surgery. - No longer experiencing issues with pill absorption; ensures to eat before taking medications. - Reports poor appetite; feels full quickly. Managing by eating smaller amounts throughout the day. - No nausea reported. - Notes scarring from surgery is getting close to the stoma. Will ask surgeon about this at upcoming appointment on 09/19/2024. - Follow-up with gastroenterology referral was unsuccessful. The office declined to see him in July due to the ostomy. Was advised they would look into an exception but no follow-up call was received.  Medications Takes Gabapentin , Flexeril , and oxycodone  in the morning. Takes Gabapentin  and Flexeril  throughout the day. Uses oxycodone  only as needed for breakthrough pain. Has been using less pain medication recently. Takes Miralax  one capful daily in 8 oz of liquid to prevent constipation. Sometimes uses chewable Miralax  (Mira-fast). Also has Zofran  for nausea as needed. Takes Tylenol  and ibuprofen  for manageable pain.  PMH, PSH, FH, Social Hx PSHx: Abdominal surgery in 2021, with subsequent ostomy. History of a liquid mass on CT scan, which has now resolved. Social Hx: Traveled last week.  ROS Pertinent positives: Abdominal pain, burning pain with stool passage through stoma, poor appetite. Pertinent negatives: No nausea.   The 10-year ASCVD risk score (Arnett DK, et al., 2019) is: 1.3%  Health Maintenance Due  Topic Date  Due   Pneumococcal Vaccine (1 of 2 - PCV) Never done   Hepatitis B Vaccines 19-59 Average Risk (1 of 3 - 19+ 3-dose series) Never done   HPV VACCINES (1 - 3-dose SCDM series) Never done   Influenza Vaccine  07/27/2024   COVID-19 Vaccine (1 - 2024-25 season) Never done      Objective:     BP 129/84   Pulse 82   Ht 5' 6 (1.676 m)   Wt 270 lb (122.5 kg)   SpO2 100%   BMI 43.58 kg/m    Physical Exam Gen: alert, oriented Gi: ostomy present.  No evidence of infection.  Small amount of brown stool present in pouch.  Psych: pleasant      Assessment & Plan:   Malnutrition of mild degree (HCC) Assessment & Plan: Patient has issues with malabsorption due to his GI condition and hx of surgeries/ostomy - Labs ordered today: CMP, CBC, ferritin, vitamin B12, vitamin D . Will review results and supplement if deficiencies are noted. Discussed potential for liquid iron  if needed due to absorption issues.  Orders: -     CBC with Differential/Platelet -     Comprehensive metabolic panel with GFR -     Iron , TIBC and Ferritin Panel -     B12 and Folate Panel -     VITAMIN D  25 Hydroxy (Vit-D Deficiency, Fractures) -     Magnesium   Colostomy present [facility]) Assessment & Plan: Patient presents for follow-up of chronic abdominal pain after surgery with ostomy. Pain is persistent, particularly with bowel movements through the stoma, described as a burning sensation. Appetite is poor, managed with small, frequent meals. Uses a  combination of gabapentin , flexeril , and oxycodone  for pain control. CT scan showed resolution of a previously seen fluid collection - Continue Gabapentin , Flexeril , and oxycodone  as prescribed. Refills sent. - Refill sent for Zofran  PRN nausea. - Discussed management of stool consistency. Advised he could either decrease the liquid mixed with Miralax  to 4 oz or increase to two capfuls in the same amount of liquid to soften stool. Also discussed using chewable Miralax  as  an alternative. Patient to adjust based on response. - Advised to discuss persistent abdominal pain, burning with stool passage, and scarring near stoma with his surgeon at the upcoming appointment on 09/19/2024. - Recommended asking surgeon for a referral to a gastroenterologist they work with, for management of absorption issues.   Other orders -     Ondansetron ; Take 1 tablet (4 mg total) by mouth every 8 (eight) hours as needed for nausea or vomiting.  Dispense: 30 tablet; Refill: 1 -     oxyCODONE  HCl; Take 1 tablet (10 mg total) by mouth every 6 (six) hours as needed.  Dispense: 20 tablet; Refill: 0 -     Gabapentin ; TAKE 1 CAPSULE BY MOUTH THREE TIMES A DAY  Dispense: 30 capsule; Refill: 1 -     Cyclobenzaprine  HCl; Take 1 tablet (10 mg total) by mouth 3 (three) times daily as needed for muscle spasms.  Dispense: 30 tablet; Refill: 1     Return in about 4 months (around 01/07/2025) for ostomy f/u.    Toribio MARLA Slain, MD

## 2024-09-08 LAB — CBC WITH DIFFERENTIAL/PLATELET
Basophils Absolute: 0 x10E3/uL (ref 0.0–0.2)
Basos: 1 %
EOS (ABSOLUTE): 0.1 x10E3/uL (ref 0.0–0.4)
Eos: 1 %
Hematocrit: 42.1 % (ref 37.5–51.0)
Hemoglobin: 13 g/dL (ref 13.0–17.7)
Immature Grans (Abs): 0.1 x10E3/uL (ref 0.0–0.1)
Immature Granulocytes: 1 %
Lymphocytes Absolute: 2 x10E3/uL (ref 0.7–3.1)
Lymphs: 34 %
MCH: 26 pg — ABNORMAL LOW (ref 26.6–33.0)
MCHC: 30.9 g/dL — ABNORMAL LOW (ref 31.5–35.7)
MCV: 84 fL (ref 79–97)
Monocytes Absolute: 0.4 x10E3/uL (ref 0.1–0.9)
Monocytes: 6 %
Neutrophils Absolute: 3.4 x10E3/uL (ref 1.4–7.0)
Neutrophils: 57 %
Platelets: 341 x10E3/uL (ref 150–450)
RBC: 5 x10E6/uL (ref 4.14–5.80)
RDW: 14.1 % (ref 11.6–15.4)
WBC: 5.9 x10E3/uL (ref 3.4–10.8)

## 2024-09-08 LAB — B12 AND FOLATE PANEL
Folate: 7.1 ng/mL (ref >3.0)
Vitamin B-12: 792 pg/mL (ref 232–1245)

## 2024-09-08 LAB — VITAMIN D 25 HYDROXY (VIT D DEFICIENCY, FRACTURES): Vit D, 25-Hydroxy: 12.9 ng/mL — ABNORMAL LOW (ref 30.0–100.0)

## 2024-09-08 LAB — IRON,TIBC AND FERRITIN PANEL
Ferritin: 79 ng/mL (ref 30–400)
Iron Saturation: 9 % — CL (ref 15–55)
Iron: 37 ug/dL — ABNORMAL LOW (ref 38–169)
Total Iron Binding Capacity: 408 ug/dL (ref 250–450)
UIBC: 371 ug/dL — ABNORMAL HIGH (ref 111–343)

## 2024-09-08 LAB — COMPREHENSIVE METABOLIC PANEL WITH GFR
ALT: 14 IU/L (ref 0–44)
AST: 21 IU/L (ref 0–40)
Albumin: 4.3 g/dL (ref 4.1–5.1)
Alkaline Phosphatase: 90 IU/L (ref 44–121)
BUN/Creatinine Ratio: 7 — ABNORMAL LOW (ref 9–20)
BUN: 6 mg/dL (ref 6–24)
Bilirubin Total: 0.3 mg/dL (ref 0.0–1.2)
CO2: 20 mmol/L (ref 20–29)
Calcium: 9 mg/dL (ref 8.7–10.2)
Chloride: 99 mmol/L (ref 96–106)
Creatinine, Ser: 0.87 mg/dL (ref 0.76–1.27)
Globulin, Total: 2.8 g/dL (ref 1.5–4.5)
Glucose: 85 mg/dL (ref 70–99)
Potassium: 4.5 mmol/L (ref 3.5–5.2)
Sodium: 140 mmol/L (ref 134–144)
Total Protein: 7.1 g/dL (ref 6.0–8.5)
eGFR: 109 mL/min/1.73 (ref >59)

## 2024-09-08 LAB — MAGNESIUM: Magnesium: 1.9 mg/dL (ref 1.6–2.3)

## 2024-09-13 ENCOUNTER — Ambulatory Visit: Payer: Self-pay | Admitting: Family Medicine

## 2024-09-13 DIAGNOSIS — F32A Depression, unspecified: Secondary | ICD-10-CM | POA: Diagnosis not present

## 2024-09-13 DIAGNOSIS — K5790 Diverticulosis of intestine, part unspecified, without perforation or abscess without bleeding: Secondary | ICD-10-CM | POA: Diagnosis not present

## 2024-09-13 DIAGNOSIS — G894 Chronic pain syndrome: Secondary | ICD-10-CM | POA: Diagnosis not present

## 2024-09-13 DIAGNOSIS — R7303 Prediabetes: Secondary | ICD-10-CM | POA: Diagnosis not present

## 2024-09-13 DIAGNOSIS — J45909 Unspecified asthma, uncomplicated: Secondary | ICD-10-CM | POA: Diagnosis not present

## 2024-09-13 DIAGNOSIS — F419 Anxiety disorder, unspecified: Secondary | ICD-10-CM | POA: Diagnosis not present

## 2024-09-13 DIAGNOSIS — M1990 Unspecified osteoarthritis, unspecified site: Secondary | ICD-10-CM | POA: Diagnosis not present

## 2024-09-13 DIAGNOSIS — K219 Gastro-esophageal reflux disease without esophagitis: Secondary | ICD-10-CM

## 2024-09-13 DIAGNOSIS — G4733 Obstructive sleep apnea (adult) (pediatric): Secondary | ICD-10-CM | POA: Diagnosis not present

## 2024-09-13 DIAGNOSIS — I10 Essential (primary) hypertension: Secondary | ICD-10-CM | POA: Diagnosis not present

## 2024-09-13 DIAGNOSIS — N200 Calculus of kidney: Secondary | ICD-10-CM

## 2024-09-13 MED ORDER — IRON (FERROUS SULFATE) 325 (65 FE) MG PO TABS
325.0000 mg | ORAL_TABLET | ORAL | 2 refills | Status: DC
Start: 2024-09-13 — End: 2024-10-24

## 2024-09-13 MED ORDER — VITAMIN D (ERGOCALCIFEROL) 1.25 MG (50000 UNIT) PO CAPS: 50000.0000 [IU] | ORAL_CAPSULE | ORAL | 0 refills | Status: DC

## 2024-09-13 NOTE — Telephone Encounter (Signed)
 LVM for return call to go over labs and schedule the appt before 01/07/25 appt

## 2024-09-19 ENCOUNTER — Other Ambulatory Visit: Payer: Self-pay | Admitting: General Surgery

## 2024-09-19 ENCOUNTER — Other Ambulatory Visit: Payer: Self-pay | Admitting: Family Medicine

## 2024-09-19 DIAGNOSIS — R1012 Left upper quadrant pain: Secondary | ICD-10-CM

## 2024-09-19 MED ORDER — GABAPENTIN 300 MG PO CAPS: ORAL_CAPSULE | ORAL | 1 refills | Status: DC

## 2024-09-19 MED ORDER — OXYCODONE HCL 10 MG PO TABS: 10.0000 mg | ORAL_TABLET | Freq: Four times a day (QID) | ORAL | 0 refills | Status: DC | PRN

## 2024-09-19 MED ORDER — CYCLOBENZAPRINE HCL 10 MG PO TABS: 10.0000 mg | ORAL_TABLET | Freq: Three times a day (TID) | ORAL | 1 refills | Status: DC | PRN

## 2024-09-19 MED ORDER — ONDANSETRON 4 MG PO TBDP: 4.0000 mg | ORAL_TABLET | Freq: Three times a day (TID) | ORAL | 1 refills | Status: DC | PRN

## 2024-09-19 NOTE — Telephone Encounter (Signed)
 Copied from CRM #[id number]. Topic: Clinical - Medication Refill >> Sep 19, 2024  1:50 PM Shardie S wrote: Medication:  Oxycodone  HCl 10 MG TABS gabapentin  (NEURONTIN ) 300 MG capsule cyclobenzaprine  (FLEXERIL ) 10 MG tablet ondansetron  (ZOFRAN -ODT) 4 MG disintegrating tablet  Has the patient contacted their pharmacy? No (Agent: If no, request that the patient contact the pharmacy for the refill. If patient does not wish to contact the pharmacy document the reason why and proceed with request.) (Agent: If yes, when and what did the pharmacy advise?)  This is the patient's preferred pharmacy:  [facility] - [place], [place] - [address] [place] [address] Phone: [phone] Fax: [phone]    Is this the correct pharmacy for this prescription? Yes If no, delete pharmacy and type the correct one.   Has the prescription been filled recently? No  Is the patient out of the medication? Yes  Has the patient been seen for an appointment in the last year OR does the patient have an upcoming appointment? Yes  Can we respond through MyChart? No  Agent: Please be advised that Rx refills may take up to 3 business days. We ask that you follow-up with your pharmacy.

## 2024-09-26 ENCOUNTER — Ambulatory Visit
Admission: RE | Admit: 2024-09-26 | Discharge: 2024-09-26 | Disposition: A | Discharge status: Home / Self Care | Source: Ambulatory Visit | Attending: General Surgery | Admitting: General Surgery

## 2024-09-26 ENCOUNTER — Other Ambulatory Visit: Payer: Self-pay | Admitting: Family Medicine

## 2024-09-26 DIAGNOSIS — R1012 Left upper quadrant pain: Secondary | ICD-10-CM

## 2024-09-26 DIAGNOSIS — R1032 Left lower quadrant pain: Secondary | ICD-10-CM | POA: Diagnosis not present

## 2024-09-26 MED ORDER — IOPAMIDOL (ISOVUE-370) INJECTION 76%
75.0000 mL | Freq: Once | INTRAVENOUS | Status: AC | PRN
  Administered 2024-09-26: 75 mL via INTRAVENOUS

## 2024-09-26 NOTE — Telephone Encounter (Signed)
 Pt also wants PCP to know that he is getting his CT Scan right now.   Copied from CRM [id number]. Topic: Clinical - Medication Refill >> Sep 26, 2024  8:43 AM Rosaria BRAVO wrote: Medication:  Oxycodone  HCl 10 MG TABS    Has the patient contacted their pharmacy? Yes (Agent: If no, request that the patient contact the pharmacy for the refill. If patient does not wish to contact the pharmacy document the reason why and proceed with request.) (Agent: If yes, when and what did the pharmacy advise?)  This is the patient's preferred pharmacy:  [facility] - [place], [place] - [address] [facility] [address] [place] [address] Phone: [phone] Fax: [phone]  Is this the correct pharmacy for this prescription? Yes If no, delete pharmacy and type the correct one.   Has the prescription been filled recently? Yes  Is the patient out of the medication? Yes  Has the patient been seen for an appointment in the last year OR does the patient have an upcoming appointment? Yes  Can we respond through MyChart? Yes  Agent: Please be advised that Rx refills may take up to 3 business days. We ask that you follow-up with your pharmacy.

## 2024-09-27 ENCOUNTER — Ambulatory Visit: Payer: Self-pay | Admitting: General Surgery

## 2024-10-01 ENCOUNTER — Ambulatory Visit: Payer: Self-pay | Admitting: Surgery

## 2024-10-01 DIAGNOSIS — Z8719 Personal history of other diseases of the digestive system: Secondary | ICD-10-CM | POA: Diagnosis not present

## 2024-10-01 DIAGNOSIS — Z933 Colostomy status: Secondary | ICD-10-CM | POA: Diagnosis not present

## 2024-10-01 DIAGNOSIS — R739 Hyperglycemia, unspecified: Secondary | ICD-10-CM

## 2024-10-01 DIAGNOSIS — Z6841 Body Mass Index (BMI) 40.0 and over, adult: Secondary | ICD-10-CM | POA: Diagnosis not present

## 2024-10-01 DIAGNOSIS — K435 Parastomal hernia without obstruction or  gangrene: Secondary | ICD-10-CM | POA: Diagnosis not present

## 2024-10-03 ENCOUNTER — Other Ambulatory Visit: Payer: Self-pay

## 2024-10-04 ENCOUNTER — Other Ambulatory Visit: Payer: Self-pay | Admitting: Family Medicine

## 2024-10-04 ENCOUNTER — Telehealth: Payer: Self-pay | Admitting: *Deleted

## 2024-10-04 MED ORDER — ONDANSETRON 4 MG PO TBDP: 4.0000 mg | ORAL_TABLET | Freq: Three times a day (TID) | ORAL | 1 refills | Status: AC | PRN

## 2024-10-04 MED ORDER — CYCLOBENZAPRINE HCL 10 MG PO TABS: 10.0000 mg | ORAL_TABLET | Freq: Three times a day (TID) | ORAL | 1 refills | Status: DC | PRN

## 2024-10-04 MED ORDER — OXYCODONE HCL 10 MG PO TABS: 10.0000 mg | ORAL_TABLET | Freq: Four times a day (QID) | ORAL | 0 refills | Status: DC | PRN

## 2024-10-04 MED ORDER — GABAPENTIN 300 MG PO CAPS: ORAL_CAPSULE | ORAL | 1 refills | Status: DC

## 2024-10-04 NOTE — Telephone Encounter (Signed)
 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

## 2024-10-04 NOTE — Telephone Encounter (Unsigned)
 Copied from CRM [id number]. Topic: Clinical - Medication Refill >> Oct 04, 2024 12:54 PM Amy B wrote: Medication:  cyclobenzaprine  (FLEXERIL ) 10 MG tablet gabapentin  (NEURONTIN ) 300 MG capsule ondansetron  (ZOFRAN -ODT) 4 MG disintegrating tablet Oxycodone  HCl 10 MG TABS [[id number]]    Has the patient contacted their pharmacy? No (Agent: If no, request that the patient contact the pharmacy for the refill. If patient does not wish to contact the pharmacy document the reason why and proceed with request.) (Agent: If yes, when and what did the pharmacy advise?)  This is the patient's preferred pharmacy:  [facility] - [place], [place] - [address] [facility] [address] [place] [address] Phone: [phone] Fax: [phone]  Is this the correct pharmacy for this prescription? Yes If no, delete pharmacy and type the correct one.   Has the prescription been filled recently? No  Is the patient out of the medication? Yes  Has the patient been seen for an appointment in the last year OR does the patient have an upcoming appointment? Yes  Can we respond through MyChart? Yes  Agent: Please be advised that Rx refills may take up to 3 business days. We ask that you follow-up with your pharmacy.

## 2024-10-04 NOTE — Telephone Encounter (Signed)
 Copied from CRM [id number]. Topic: General - Other >> Oct 04, 2024  1:01 PM Amy B wrote: Reason for CRM: Patient wants provider to know he will be having an ostomy reversal and hernia repair in January 2026.

## 2024-10-10 ENCOUNTER — Other Ambulatory Visit: Payer: Self-pay

## 2024-10-11 ENCOUNTER — Other Ambulatory Visit: Payer: Self-pay | Admitting: Family Medicine

## 2024-10-11 MED ORDER — OXYCODONE HCL 10 MG PO TABS: 10.0000 mg | ORAL_TABLET | Freq: Four times a day (QID) | ORAL | 0 refills | Status: DC | PRN

## 2024-10-11 NOTE — Telephone Encounter (Signed)
 Copied from CRM [id number]. Topic: Clinical - Medication Refill >> Oct 11, 2024  1:41 PM Berwyn MATSU wrote: Medication:  Oxycodone  HCl 10 MG TABS  Has the patient contacted their pharmacy? Yes (Agent: If no, request that the patient contact the pharmacy for the refill. If patient does not wish to contact the pharmacy document the reason why and proceed with request.) (Agent: If yes, when and what did the pharmacy advise?)  This is the patient's preferred pharmacy:  [facility] - [place], [place] - [address] [facility] [address] [place] [address] Phone: [phone] Fax: [phone]   Is this the correct pharmacy for this prescription? Yes If no, delete pharmacy and type the correct one.   Has the prescription been filled recently? Yes  Is the patient out of the medication? No 1 pill left only  Has the patient been seen for an appointment in the last year OR does the patient have an upcoming appointment? Yes  Can we respond through MyChart? Yes  Agent: Please be advised that Rx refills may take up to 3 business days. We ask that you follow-up with your pharmacy.

## 2024-10-17 ENCOUNTER — Other Ambulatory Visit: Payer: Self-pay

## 2024-10-18 ENCOUNTER — Telehealth: Payer: Self-pay

## 2024-10-18 ENCOUNTER — Other Ambulatory Visit: Payer: Self-pay | Admitting: Family Medicine

## 2024-10-18 DIAGNOSIS — Z933 Colostomy status: Secondary | ICD-10-CM

## 2024-10-18 MED ORDER — OXYCODONE HCL 10 MG PO TABS: 10.0000 mg | ORAL_TABLET | Freq: Four times a day (QID) | ORAL | 0 refills | Status: DC | PRN

## 2024-10-18 NOTE — Telephone Encounter (Signed)
 Copied from CRM #[id number]. Topic: Clinical - Medication Question >> Oct 18, 2024 12:01 PM Antony RAMAN wrote: Reason for CRM: [id number] [address] rings [phone] pouches [phone] device  Patient needs

## 2024-10-18 NOTE — Telephone Encounter (Signed)
 Copied from CRM #[id number]. Topic: Clinical - Medication Refill >> Oct 18, 2024 11:58 AM Kendralyn S wrote: Medication: Oxycodone  HCl 10 MG TABS  Has the patient contacted their pharmacy? No (Agent: If no, request that the patient contact the pharmacy for the refill. If patient does not wish to contact the pharmacy document the reason why and proceed with request.) (Agent: If yes, when and what did the pharmacy advise?)  This is the patient's preferred pharmacy:  [facility] - [place], [place] - [address] [facility] [place] [address] Phone: [phone] Fax: [phone]  Is this the correct pharmacy for this prescription? Yes If no, delete pharmacy and type the correct one.   Has the prescription been filled recently? Yes  Is the patient out of the medication? No  Has the patient been seen for an appointment in the last year OR does the patient have an upcoming appointment? Yes  Can we respond through MyChart? Yes  Agent: Please be advised that Rx refills may take up to 3 business days. We ask that you follow-up with your pharmacy.

## 2024-10-22 DIAGNOSIS — Z933 Colostomy status: Secondary | ICD-10-CM | POA: Diagnosis not present

## 2024-10-22 DIAGNOSIS — K5732 Diverticulitis of large intestine without perforation or abscess without bleeding: Secondary | ICD-10-CM | POA: Diagnosis not present

## 2024-10-22 DIAGNOSIS — K631 Perforation of intestine (nontraumatic): Secondary | ICD-10-CM | POA: Diagnosis not present

## 2024-10-22 NOTE — Telephone Encounter (Signed)
 Submitting a new order for these.  I am not sure how we ended up ordering them last time but I have put a new signed dme order in the basket for him.

## 2024-10-23 DIAGNOSIS — K5732 Diverticulitis of large intestine without perforation or abscess without bleeding: Secondary | ICD-10-CM | POA: Diagnosis not present

## 2024-10-23 DIAGNOSIS — Z933 Colostomy status: Secondary | ICD-10-CM | POA: Diagnosis not present

## 2024-10-23 DIAGNOSIS — K631 Perforation of intestine (nontraumatic): Secondary | ICD-10-CM | POA: Diagnosis not present

## 2024-10-23 NOTE — Telephone Encounter (Signed)
 Contacted [facility] and they stated that they just sent out some new supplies yesterday.

## 2024-10-24 ENCOUNTER — Other Ambulatory Visit: Payer: Self-pay | Admitting: Family Medicine

## 2024-10-24 NOTE — Telephone Encounter (Unsigned)
 Copied from CRM #[id number]. Topic: Clinical - Medication Refill >> Oct 24, 2024  3:47 PM Fonda T wrote: Medication: Iron , Ferrous Sulfate , 325 (65 Fe) MG TABS Oxycodone  HCl 10 MG TABS   Has the patient contacted their pharmacy? No, per patient for one of the medications always has to call office for refill   This is the patient's preferred pharmacy:  [facility] - [place], [place] - [address] [facility] [address] [place] [address] Phone: [phone] Fax: [phone]    Is this the correct pharmacy for this prescription? Yes If no, delete pharmacy and type the correct one.   Has the prescription been filled recently? Yes  Is the patient out of the medication? Yes  Has the patient been seen for an appointment in the last year OR does the patient have an upcoming appointment? Yes  Can we respond through MyChart? Yes  Agent: Please be advised that Rx refills may take up to 3 business days. We ask that you follow-up with your pharmacy.

## 2024-10-25 MED ORDER — OXYCODONE HCL 10 MG PO TABS: 10.0000 mg | ORAL_TABLET | Freq: Four times a day (QID) | ORAL | 0 refills | Status: DC | PRN

## 2024-10-25 MED ORDER — IRON (FERROUS SULFATE) 325 (65 FE) MG PO TABS: 325.0000 mg | ORAL_TABLET | ORAL | 2 refills | Status: AC

## 2024-10-31 ENCOUNTER — Ambulatory Visit: Payer: Self-pay

## 2024-10-31 ENCOUNTER — Other Ambulatory Visit: Payer: Self-pay | Admitting: Family Medicine

## 2024-10-31 ENCOUNTER — Ambulatory Visit

## 2024-10-31 VITALS — BP 125/78 | HR 91 | Temp 98.3°F | Ht 66.0 in | Wt 269.1 lb

## 2024-10-31 DIAGNOSIS — R051 Acute cough: Secondary | ICD-10-CM | POA: Diagnosis not present

## 2024-10-31 DIAGNOSIS — J45909 Unspecified asthma, uncomplicated: Secondary | ICD-10-CM

## 2024-10-31 DIAGNOSIS — J069 Acute upper respiratory infection, unspecified: Secondary | ICD-10-CM | POA: Diagnosis not present

## 2024-10-31 LAB — POC COVID19/FLU A&B COMBO
Covid Antigen, POC: NEGATIVE
Influenza A Antigen, POC: NEGATIVE
Influenza B Antigen, POC: NEGATIVE

## 2024-10-31 MED ORDER — ALBUTEROL SULFATE HFA 108 (90 BASE) MCG/ACT IN AERS: 2.0000 | INHALATION_SPRAY | Freq: Four times a day (QID) | RESPIRATORY_TRACT | 2 refills | Status: AC | PRN

## 2024-10-31 MED ORDER — PROMETHAZINE-DM 6.25-15 MG/5ML PO SYRP: 5.0000 mL | ORAL_SOLUTION | Freq: Four times a day (QID) | ORAL | 0 refills | Status: DC | PRN

## 2024-10-31 MED ORDER — BENZONATATE 100 MG PO CAPS: 100.0000 mg | ORAL_CAPSULE | Freq: Two times a day (BID) | ORAL | 0 refills | Status: DC | PRN

## 2024-10-31 MED ORDER — OXYCODONE HCL 10 MG PO TABS: 10.0000 mg | ORAL_TABLET | Freq: Four times a day (QID) | ORAL | 0 refills | Status: DC | PRN

## 2024-10-31 NOTE — Telephone Encounter (Signed)
   FYI Only or Action Required?: FYI only for provider: appointment scheduled on 10/31/2024.  Patient was last seen in primary care on 09/07/2024 by [name], MD.  Called Nurse Triage reporting Cough.  Symptoms began yesterday.  Interventions attempted: OTC medications: nyquil.  Symptoms are: gradually worsening.  Triage Disposition: See HCP Within 4 Hours (Or PCP Triage)  Patient/caregiver understands and will follow disposition?: YesCopied from CRM #[id number]. Topic: Clinical - Red Word Triage >> Oct 31, 2024  1:23 PM Amy B wrote: Red Word that prompted transfer to Nurse Triage: chest pain, rattling in chest, severe cough. Difficulty breathing Reason for Disposition  [1] MILD difficulty breathing (e.g., minimal/no SOB at rest, SOB with walking, pulse < 100) AND [2] still present when not coughing  Answer Assessment - Initial Assessment Questions 1. ONSET: When did the cough begin?      Two days ago  3. SPUTUM: Describe the color of your sputum (e.g., none, dry cough; clear, white, yellow, green)     Brownish green 4. HEMOPTYSIS: Are you coughing up any blood? If Yes, ask: How much? (e.g., flecks, streaks, tablespoons, etc.)     denies 5. DIFFICULTY BREATHING: Are you having difficulty breathing? If Yes, ask: How bad is it? (e.g., mild, moderate, severe)      A little bit with cough or activity 6. FEVER: Do you have a fever? If Yes, ask: What is your temperature, how was it measured, and when did it start?     99.6 7. CARDIAC HISTORY: Do you have any history of heart disease? (e.g., heart attack, congestive heart failure)      HTN 8. LUNG HISTORY: Do you have any history of lung disease?  (e.g., pulmonary embolus, asthma, emphysema)     Sleep apnea, reactive airway disease 9. PE RISK FACTORS: Do you have a history of blood clots? (or: recent major surgery, recent prolonged travel, bedridden)     N/a 10. OTHER SYMPTOMS: Do you have any other  symptoms? (e.g., runny nose, wheezing, chest pain)       Runny nose, head pressure 12. TRAVEL: Have you traveled out of the country in the last month? (e.g., travel history, exposures)       denies  Protocols used: Cough - Acute Productive-A-AH

## 2024-10-31 NOTE — Assessment & Plan Note (Signed)
 Acute upper respiratory symptoms likely viral. COVID and flu testing NEGATIVE.  Potential bacterial infection if symptoms persist or worsen. - Prescribed cough syrup and Tessalon Perles for cough suppression. - Advised Mucinex  for mucus thinning. - Advised Ventolin  inhaler every four hours as needed. - Instructed to monitor symptoms and update if not improving within 10 days  - Recommend to continue supportive OTC measures (Musinex, flonase , sudafed, fluids, rest)

## 2024-10-31 NOTE — Patient Instructions (Addendum)
 VISIT SUMMARY: Today, you were seen for a cough and chest pain that started yesterday, along with sore throat and itchy ears. You have a history of recurrent respiratory infections and are currently using several medications to manage your symptoms.  YOUR PLAN: ACUTE UPPER RESPIRATORY INFECTION: You have symptoms of an upper respiratory infection, likely viral, but we are waiting for COVID and flu test results. There is a possibility of a bacterial infection if your symptoms persist or worsen. -You have been prescribed cough syrup and Tessalon Perles to help suppress your cough. -You can take Mucinex  to help thin the mucus. -Use your Ventolin  inhaler every four hours as needed. -Monitor your symptoms and let us  know if they are not improving by Monday, as you may need antibiotics. -Continue to quarantine and use a mask around your family to prevent spreading the illness.  ASTHMA: Your asthma is being exacerbated by the upper respiratory infection. -Your Ventolin  inhaler has been refilled. Use it every four hours as needed.  GENERAL HEALTH MAINTENANCE: We discussed your flu vaccination, but it has been deferred due to your current illness. -Schedule your flu vaccination once your symptoms resolve.  If you have any problems before your next visit feel free to message me via MyChart (minor issues or questions) or call the office, otherwise you may reach out to schedule an office visit.  Thank you! Saddie Sacks, PA-C

## 2024-10-31 NOTE — Progress Notes (Signed)
 Acute Office Visit  Subjective:     Patient ID: Jordan [name], male    DOB: 1981-05-23, 43 y.o.   MRN: [id number]  Chief Complaint  Patient presents with   Cough    HPI  History of Present Illness Jordan [name] is a 43 year old male who presents with cough and chest pain.  Cough and chest pain - Cough onset yesterday, associated with chest pain, especially when lying down - Chest pain is exacerbated by coughing - Wheezing present with cough - No fever  Upper respiratory symptoms - Sore throat and itchy ears began yesterday - Ears sometimes feel clogged - History of recurrent respiratory infections  Medication use and response - DayQuil and Tussin syrup taken without relief - Sudafed used; considering Mucinex  for mucus - Ventolin  inhaler in use - Previously prescribed Tessalon Perles for similar symptoms  Exposure history and preventive measures - Suspects illness contracted from son, niece, and nephew after taking him for treatment - Exposure occurred during chilly weather with physical exertion - Quarantining to prevent further spread of illness     ROS Per HPI     Objective:    BP 125/78   Pulse 91   Temp 98.3 F (36.8 C) (Oral)   Ht 5' 6 (1.676 m)   Wt 269 lb 1.9 oz (122.1 kg)   SpO2 99%   BMI 43.44 kg/m    Physical Exam Constitutional:      Appearance: Normal appearance.  HENT:     Head: Normocephalic.     Right Ear: Tympanic membrane normal.     Left Ear: Tympanic membrane normal.     Nose: Congestion and rhinorrhea present.     Mouth/Throat:     Pharynx: Oropharynx is clear. Posterior oropharyngeal erythema present. No oropharyngeal exudate.  Cardiovascular:     Rate and Rhythm: Normal rate and regular rhythm.  Pulmonary:     Effort: Pulmonary effort is normal.     Breath sounds: Wheezing present.  Abdominal:     General: Bowel sounds are normal.  Musculoskeletal:     Cervical back: Neck  supple.  Lymphadenopathy:     Cervical: No cervical adenopathy.  Skin:    General: Skin is warm and dry.  Neurological:     Mental Status: He is alert.      No results found for any visits on 10/31/24.      Assessment & Plan:   Acute cough -     POC Covid19/Flu A&B Antigen  Moderate asthma without complication, unspecified whether persistent -     Albuterol  Sulfate HFA; Inhale 2 puffs into the lungs every 6 (six) hours as needed for wheezing or shortness of breath.  Dispense: 18 g; Refill: 2  Viral URI with cough Assessment & Plan: Acute upper respiratory symptoms likely viral. COVID and flu testing NEGATIVE.  Potential bacterial infection if symptoms persist or worsen. - Prescribed cough syrup and Tessalon Perles for cough suppression. - Advised Mucinex  for mucus thinning. - Advised Ventolin  inhaler every four hours as needed. - Instructed to monitor symptoms and update if not improving within 10 days  - Recommend to continue supportive OTC measures (Musinex, flonase , sudafed, fluids, rest)   Other orders -     Benzonatate; Take 1 capsule (100 mg total) by mouth 2 (two) times daily as needed for cough.  Dispense: 20 capsule; Refill: 0 -     Promethazine -DM; Take 5 mLs by mouth 4 (four) times daily  as needed.  Dispense: 118 mL; Refill: 0     Return if symptoms worsen or fail to improve.  Saddie JULIANNA Sacks, PA-C

## 2024-10-31 NOTE — Telephone Encounter (Unsigned)
 Copied from CRM [id number]. Topic: Clinical - Medication Refill >> Oct 31, 2024  1:21 PM Amy B wrote: Medication: Oxycodone  HCl 10 MG TABS  Has the patient contacted their pharmacy? No (Agent: If no, request that the patient contact the pharmacy for the refill. If patient does not wish to contact the pharmacy document the reason why and proceed with request.) (Agent: If yes, when and what did the pharmacy advise?)  This is the patient's preferred pharmacy:  [facility] - [place], [place] - [address] [facility] [address] [place] [address] Phone: [phone] Fax: [phone]  Is this the correct pharmacy for this prescription? Yes If no, delete pharmacy and type the correct one.   Has the prescription been filled recently? No  Is the patient out of the medication? No  Has the patient been seen for an appointment in the last year OR does the patient have an upcoming appointment? Yes  Can we respond through MyChart? Yes  Agent: Please be advised that Rx refills may take up to 3 business days. We ask that you follow-up with your pharmacy.

## 2024-11-07 ENCOUNTER — Ambulatory Visit: Payer: Self-pay

## 2024-11-07 ENCOUNTER — Other Ambulatory Visit: Payer: Self-pay | Admitting: Family Medicine

## 2024-11-07 MED ORDER — OXYCODONE HCL 10 MG PO TABS: 10.0000 mg | ORAL_TABLET | Freq: Four times a day (QID) | ORAL | 0 refills | Status: DC | PRN

## 2024-11-07 MED ORDER — VITAMIN D (ERGOCALCIFEROL) 1.25 MG (50000 UNIT) PO CAPS: 50000.0000 [IU] | ORAL_CAPSULE | ORAL | 0 refills | Status: DC

## 2024-11-07 NOTE — Telephone Encounter (Signed)
 FYI Only or Action Required?: Action required by provider: Requesting abx and tessalon pearls.  Patient was last seen in primary care on 10/31/2024 by Gayle Saddie FALCON, PA-C.  Called Nurse Triage reporting Cough.  Symptoms began a week ago.  Interventions attempted: Prescription medications: Tessalon pearls .  Symptoms are: unchanged.  Triage Disposition: Home Care  Patient/caregiver understands and will follow disposition?: Yes Reason for Disposition  Cough with cold symptoms (e.g., runny nose, postnasal drip, throat clearing)  Answer Assessment - Initial Assessment Questions Patient was seen 11/5 for URI symptoms have not improved and would like something be sent into pharmacy or more tessalon pearls. [facility] on file. Please advise.   1. ONSET: When did the cough begin?      Last week  2. SEVERITY: How bad is the cough today?      Starting to hurt hernia, having ostomy reversal in January  3. SPUTUM: Describe the color of your sputum (e.g., none, dry cough; clear, white, yellow, green)     Green  4. DIFFICULTY BREATHING: Are you having difficulty breathing? If Yes, ask: How bad is it? (e.g., mild, moderate, severe)      Denies  4. LUNG HISTORY: Do you have any history of lung disease?  (e.g., pulmonary embolus, asthma, emphysema)     Asthma  5. OTHER SYMPTOMS: Do you have any other symptoms? (e.g., runny nose, wheezing, chest pain)       Nasal congestion, runny nose  Protocols used: Cough - Acute Productive-A-AH  Copied from CRM [id number]. Topic: Clinical - Red Word Triage >> Nov 07, 2024  2:20 PM Wess RAMAN wrote: Red Word that prompted transfer to Nurse Triage: Patient came in last Friday and still doesn't feel good.   Symptoms: Coughing, wheezing, difficulty breathing  Pharmacy: [facility] - [place], [place] - [address] [facility] [address] [place] [address] Phone: [phone] Fax: [phone] Hours: Not open 24 hours

## 2024-11-07 NOTE — Telephone Encounter (Signed)
 Copied from CRM #[id number]. Topic: Clinical - Medication Refill >> Nov 07, 2024  2:22 PM Wess RAMAN wrote: Medication: Oxycodone  HCl 10 MG TABS  Vitamin D , Ergocalciferol , (DRISDOL ) 1.25 MG (50000 UNIT) CAPS capsule   Has the patient contacted their pharmacy? Yes (Agent: If no, request that the patient contact the pharmacy for the refill. If patient does not wish to contact the pharmacy document the reason why and proceed with request.) (Agent: If yes, when and what did the pharmacy advise?)  This is the patient's preferred pharmacy:  [facility] - [place], [place] - [address] [facility] [address] Jordan [name] [place] [address] Phone: [phone] Fax: [phone]  Is this the correct pharmacy for this prescription? Yes If no, delete pharmacy and type the correct one.   Has the prescription been filled recently? Yes  Is the patient out of the medication? No  Has the patient been seen for an appointment in the last year OR does the patient have an upcoming appointment? Yes  Can we respond through MyChart? Yes  Agent: Please be advised that Rx refills may take up to 3 business days. We ask that you follow-up with your pharmacy.

## 2024-11-08 ENCOUNTER — Telehealth: Payer: Self-pay

## 2024-11-08 NOTE — Telephone Encounter (Signed)
 Copied from CRM [id number]. Topic: Clinical - Medication Question >> Nov 08, 2024  4:21 PM Winona R wrote: After speaking with triage yesterday the pt was told hell hve meds sent to the pharm however he has not heard anything back

## 2024-11-13 ENCOUNTER — Other Ambulatory Visit: Payer: Self-pay | Admitting: Family Medicine

## 2024-11-13 MED ORDER — AZITHROMYCIN 250 MG PO TABS: ORAL_TABLET | ORAL | 0 refills | Status: AC

## 2024-11-13 MED ORDER — BENZONATATE 100 MG PO CAPS: 100.0000 mg | ORAL_CAPSULE | Freq: Two times a day (BID) | ORAL | 0 refills | Status: DC | PRN

## 2024-11-13 NOTE — Telephone Encounter (Signed)
 Please let the patient know I will send in more of the tessalon and azithromycin 

## 2024-11-14 ENCOUNTER — Telehealth: Payer: Self-pay

## 2024-11-14 ENCOUNTER — Other Ambulatory Visit: Payer: Self-pay | Admitting: Urology

## 2024-11-14 ENCOUNTER — Telehealth: Payer: Self-pay | Admitting: Family Medicine

## 2024-11-14 NOTE — Telephone Encounter (Signed)
 Called pt.  VM is full.

## 2024-11-14 NOTE — Telephone Encounter (Unsigned)
 Copied from CRM [id number]. Topic: Clinical - Medication Refill >> Nov 14, 2024 12:06 PM Viola F wrote: Medication: Oxycodone  HCl 10 MG TABS [[id number]] gabapentin  (NEURONTIN ) 300 MG capsule [[id number]] cyclobenzaprine  (FLEXERIL ) 10 MG tablet [[id number]]  Has the patient contacted their pharmacy? Yes (Agent: If no, request that the patient contact the pharmacy for the refill. If patient does not wish to contact the pharmacy document the reason why and proceed with request.) (Agent: If yes, when and what did the pharmacy advise?)  This is the patient's preferred pharmacy:   [facility] - [place], [place] - [address] [facility] [address] [place] [address] Phone: [phone] Fax: [phone]  Is this the correct pharmacy for this prescription? Yes If no, delete pharmacy and type the correct one.   Has the prescription been filled recently? Yes  Is the patient out of the medication? No  Has the patient been seen for an appointment in the last year OR does the patient have an upcoming appointment? Yes  Can we respond through MyChart? Yes  Agent: Please be advised that Rx refills may take up to 3 business days. We ask that you follow-up with your pharmacy.

## 2024-11-14 NOTE — Telephone Encounter (Signed)
 Copied from CRM [id number]. Topic: Clinical - Medication Question >> Nov 14, 2024 12:10 PM Viola F wrote: Reason for CRM: Patient returned Hazel's call , I let him know that the tessalon and azithromycin  was sent to his pharmacy. He said thank you and requested refill for the: Oxycodone  HCl 10 MG TABS [[id number]] Gabapentin  (NEURONTIN ) 300 MG capsule [[id number]] Cyclobenzaprine  (FLEXERIL ) 10 MG tablet [[id number]]  (New crm created to request these additional refills)

## 2024-11-14 NOTE — Telephone Encounter (Signed)
 Tried calling the patient; voicemail is full. Provider has sent in the requested medications to pharmacy on file.

## 2024-11-15 MED ORDER — GABAPENTIN 300 MG PO CAPS: ORAL_CAPSULE | ORAL | 1 refills | Status: AC

## 2024-11-15 MED ORDER — OXYCODONE HCL 10 MG PO TABS: 10.0000 mg | ORAL_TABLET | Freq: Four times a day (QID) | ORAL | 0 refills | Status: DC | PRN

## 2024-11-15 MED ORDER — CYCLOBENZAPRINE HCL 10 MG PO TABS: 10.0000 mg | ORAL_TABLET | Freq: Three times a day (TID) | ORAL | 1 refills | Status: DC | PRN

## 2024-11-20 ENCOUNTER — Other Ambulatory Visit: Payer: Self-pay | Admitting: Family Medicine

## 2024-11-20 NOTE — Telephone Encounter (Unsigned)
 Copied from CRM #[id number]. Topic: Clinical - Medication Refill >> Nov 20, 2024 10:26 AM Tinnie C wrote: Medication: Oxycodone  HCl 10 MG TABS  Has the patient contacted their pharmacy? No (Agent: If no, request that the patient contact the pharmacy for the refill. If patient does not wish to contact the pharmacy document the reason why and proceed with request.) (Agent: If yes, when and what did the pharmacy advise?)  This is the patient's preferred pharmacy:  [facility] - [place], [place] - [address] [facility] [address] [place] [address] Phone: [phone] Fax: [phone]  Is this the correct pharmacy for this prescription? Yes If no, delete pharmacy and type the correct one.   Has the prescription been filled recently? Yes, 5 days ago for 5 day supply.   Is the patient out of the medication? Yes  Has the patient been seen for an appointment in the last year OR does the patient have an upcoming appointment? Yes, last seen 11/5  Can we respond through MyChart? Yes  Agent: Please be advised that Rx refills may take up to 3 business days. We ask that you follow-up with your pharmacy.

## 2024-11-21 MED ORDER — OXYCODONE HCL 10 MG PO TABS: 10.0000 mg | ORAL_TABLET | Freq: Four times a day (QID) | ORAL | 0 refills | Status: DC | PRN

## 2024-11-28 ENCOUNTER — Telehealth: Payer: Self-pay | Admitting: Family Medicine

## 2024-11-28 ENCOUNTER — Ambulatory Visit: Payer: Self-pay

## 2024-11-28 ENCOUNTER — Other Ambulatory Visit: Payer: Self-pay | Admitting: Family Medicine

## 2024-11-28 MED ORDER — PREDNISONE 10 MG PO TABS: ORAL_TABLET | ORAL | 0 refills | Status: AC

## 2024-11-28 NOTE — Telephone Encounter (Signed)
 Please contact pt if provider would like to advise further and/or send rx. Confirmed pharmacy.   FYI Only or Action Required?: Action required by provider: update on patient condition.  Patient was last seen in primary care on 10/31/2024 by Gayle Saddie FALCON, PA-C.  Called Nurse Triage reporting No chief complaint on file..  Symptoms began several weeks ago.  Interventions attempted: Prescription medications: azithromycin .  Symptoms are: gradually worsening.  Triage Disposition: Call PCP Within 24 Hours  Patient/caregiver understands and will follow disposition?: Yes  Reason for Disposition  [1] Taking antibiotic > 72 hours (3 days) AND [2] symptoms (other than fever) not improved  Answer Assessment - Initial Assessment Questions Pt reports productive cough x 1 month, worse at night. States he has left sided wheezing when laying down. Completed azithromycin  on 11/18/24. States symptoms never completely resolved since visit on 10/31/24. Denies fever  Wheezing resolves with use of albuterol  inhaler. Pt denies needing refill at this time. He is going to try Mucinex . He does report discomfort at his stoma site when coughing only, had revision in June.   Pt declined appointment, would like message sent to PCP. Pt to call back with worsening of symptoms, onset of new symptoms or if symptoms do not improve.   1. INFECTION: What infection is the antibiotic being given for?     URI 2. ANTIBIOTIC: What antibiotic are you taking How many times per day?     Azithromycin  250 mg x 5 d 3. DURATION: When was the antibiotic started?     5 days 4. MAIN CONCERN OR SYMPTOM:  What is your main concern right now?     Continuation of symptoms, wheezing 5. BETTER-SAME-WORSE: Are you getting better, staying the same, or getting worse compared to when you first started the antibiotics? If getting worse, ask: In what way?      Worsening 6. FEVER: Do you have a fever? If Yes, ask: What is your  temperature, how was it measured, and when did it start?     Denies  Protocols used: Infection on Antibiotic Follow-up Call-A-AH Copied from CRM #[id number]. Topic: Clinical - Medical Advice >> Nov 28, 2024  1:38 PM Shanda MATSU wrote: Reason for CRM: Patient reporting hearing rattling in chest when laying straight on his back, patient stated that rattling then leads to a coughing fit, patient is wanting to know if this is normal or if it will go away.

## 2024-11-28 NOTE — Telephone Encounter (Unsigned)
 Copied from CRM [id number]. Topic: Clinical - Medication Refill >> Nov 28, 2024  1:35 PM Shanda MATSU wrote: Medication: Oxycodone  HCl 10 MG TABS, benzonatate  (TESSALON ) 100 MG capsule  Has the patient contacted their pharmacy? Yes, referred to provider (Agent: If no, request that the patient contact the pharmacy for the refill. If patient does not wish to contact the pharmacy document the reason why and proceed with request.) (Agent: If yes, when and what did the pharmacy advise?)  This is the patient's preferred pharmacy:  [facility] - [place], [place] - [address] [place] [address] Phone: [phone] Fax: [phone]  Is this the correct pharmacy for this prescription? Yes If no, delete pharmacy and type the correct one.   Has the prescription been filled recently? Yes  Is the patient out of the medication? Yes  Has the patient been seen for an appointment in the last year OR does the patient have an upcoming appointment? Yes  Can we respond through MyChart? Yes  Agent: Please be advised that Rx refills may take up to 3 business days. We ask that you follow-up with your pharmacy.

## 2024-11-28 NOTE — Telephone Encounter (Signed)
 Please let the pt know I am going to send in a prescription for prednisone  for him to take daily over the next week.  The instructions are on the bottle and include a tapering period at the end.

## 2024-11-29 MED ORDER — OXYCODONE HCL 10 MG PO TABS: 10.0000 mg | ORAL_TABLET | Freq: Four times a day (QID) | ORAL | 0 refills | Status: DC | PRN

## 2024-11-29 NOTE — Telephone Encounter (Signed)
 Called patient he is advise of the Rx that was sent

## 2024-12-05 ENCOUNTER — Other Ambulatory Visit: Payer: Self-pay | Admitting: Family Medicine

## 2024-12-05 NOTE — Telephone Encounter (Signed)
 Copied from CRM [id number]. Topic: Clinical - Medication Refill >> Dec 05, 2024 10:55 AM Tiffini S wrote: Medication: Oxycodone  HCl 10 MG TABS,  benzonatate  (TESSALON ) 100 MG capsule  Has the patient contacted their pharmacy? No (Agent: If no, request that the patient contact the pharmacy for the refill. If patient does not wish to contact the pharmacy document the reason why and proceed with request.) (Agent: If yes, when and what did the pharmacy advise?)  This is the patient's preferred pharmacy:  [facility] - [place], [place] - [address] [facility] [address] [place] [address] Phone: [phone] Fax: [phone]  Is this the correct pharmacy for this prescription? Yes If no, delete pharmacy and type the correct one.   Has the prescription been filled recently? Yes  Is the patient out of the medication? No  Has the patient been seen for an appointment in the last year OR does the patient have an upcoming appointment? Yes  Can we respond through MyChart? Yes  Agent: Please be advised that Rx refills may take up to 3 business days. We ask that you follow-up with your pharmacy.

## 2024-12-06 MED ORDER — BENZONATATE 100 MG PO CAPS: 100.0000 mg | ORAL_CAPSULE | Freq: Two times a day (BID) | ORAL | 0 refills | Status: DC | PRN

## 2024-12-06 MED ORDER — OXYCODONE HCL 10 MG PO TABS: 10.0000 mg | ORAL_TABLET | Freq: Four times a day (QID) | ORAL | 0 refills | Status: DC | PRN

## 2024-12-12 ENCOUNTER — Other Ambulatory Visit: Payer: Self-pay | Admitting: Family Medicine

## 2024-12-12 MED ORDER — OXYCODONE HCL 10 MG PO TABS: 10.0000 mg | ORAL_TABLET | Freq: Four times a day (QID) | ORAL | 0 refills | Status: DC | PRN

## 2024-12-12 NOTE — Telephone Encounter (Signed)
 Copied from CRM #[id number]. Topic: Clinical - Medication Refill >> Dec 12, 2024 12:23 PM Emylou G wrote: Medication: Oxycodone  HCl 10 MG TABS  Has the patient contacted their pharmacy? No (Agent: If no, request that the patient contact the pharmacy for the refill. If patient does not wish to contact the pharmacy document the reason why and proceed with request.) (Agent: If yes, when and what did the pharmacy advise?)  This is the patient's preferred pharmacy:  [facility] - [place], [place] - [address] [facility] [address] [place] [address] Phone: [phone] Fax: [phone]   Is this the correct pharmacy for this prescription? Yes If no, delete pharmacy and type the correct one.   Has the prescription been filled recently? Yes  Is the patient out of the medication? Yes  Has the patient been seen for an appointment in the last year OR does the patient have an upcoming appointment? Yes  Can we respond through MyChart? Yes  Agent: Please be advised that Rx refills may take up to 3 business days. We ask that you follow-up with your pharmacy.

## 2024-12-17 ENCOUNTER — Other Ambulatory Visit: Payer: Self-pay | Admitting: Family Medicine

## 2024-12-17 MED ORDER — OXYCODONE HCL 10 MG PO TABS: 10.0000 mg | ORAL_TABLET | Freq: Four times a day (QID) | ORAL | 0 refills | Status: DC | PRN

## 2024-12-17 NOTE — Telephone Encounter (Signed)
 Copied from CRM [id number]. Topic: Clinical - Medication Refill >> Dec 17, 2024  2:36 PM Delon T wrote: Medication: Vitamin D , Ergocalciferol , (DRISDOL ) 1.25 MG (50000 UNIT) CAPS capsule Oxycodone  HCl 10 MG TABS  Has the patient contacted their pharmacy? No (Agent: If no, request that the patient contact the pharmacy for the refill. If patient does not wish to contact the pharmacy document the reason why and proceed with request.) (Agent: If yes, when and what did the pharmacy advise?)  This is the patient's preferred pharmacy:  [facility] - [place], [place] - [address] [address] [place] [address] Phone: [phone] Fax: [phone]    Is this the correct pharmacy for this prescription? Yes If no, delete pharmacy and type the correct one.   Has the prescription been filled recently? Yes  Is the patient out of the medication? Yes  Has the patient been seen for an appointment in the last year OR does the patient have an upcoming appointment? Yes  Can we respond through MyChart? Yes  Agent: Please be advised that Rx refills may take up to 3 business days. We ask that you follow-up with your pharmacy.

## 2024-12-18 MED ORDER — VITAMIN D (ERGOCALCIFEROL) 1.25 MG (50000 UNIT) PO CAPS: 50000.0000 [IU] | ORAL_CAPSULE | ORAL | 0 refills | Status: AC

## 2024-12-18 NOTE — Addendum Note (Signed)
 Addended by: [name] on: 12/18/2024 11:39 AM   Modules accepted: Orders

## 2024-12-24 ENCOUNTER — Other Ambulatory Visit: Payer: Self-pay | Admitting: Family Medicine

## 2024-12-24 MED ORDER — OXYCODONE HCL 10 MG PO TABS: 10.0000 mg | ORAL_TABLET | Freq: Four times a day (QID) | ORAL | 0 refills | Status: DC | PRN

## 2024-12-24 NOTE — Telephone Encounter (Signed)
 Copied from CRM [id number]. Topic: Clinical - Medication Refill >> Dec 24, 2024  2:59 PM Avram G wrote: Medication: Oxycodone  HCl 10 MG TABS [[id number]]  Has the patient contacted their pharmacy? Yes (Agent: If no, request that the patient contact the pharmacy for the refill. If patient does not wish to contact the pharmacy document the reason why and proceed with request.) (Agent: If yes, when and what did the pharmacy advise?)  This is the patient's preferred pharmacy:  [facility] - [place], [place] - [address] [facility] [address] [place] [name] [address] Phone: [phone] Fax: [phone]  [id number] - [place], [place] - [address] [place] [place] [address] Phone: [phone] Fax: [phone]  Is this the correct pharmacy for this prescription? Yes If no, delete pharmacy and type the correct one.   Has the prescription been filled recently? No  Is the patient out of the medication? No  Has the patient been seen for an appointment in the last year OR does the patient have an upcoming appointment? Yes  Can we respond through MyChart? Yes  Agent: Please be advised that Rx refills may take up to 3 business days. We ask that you follow-up with your pharmacy.

## 2024-12-29 ENCOUNTER — Encounter: Payer: Self-pay | Admitting: Internal Medicine

## 2024-12-31 ENCOUNTER — Other Ambulatory Visit: Payer: Self-pay | Admitting: Family Medicine

## 2024-12-31 NOTE — Telephone Encounter (Signed)
 Copied from CRM #[id number]. Topic: Clinical - Medication Refill >> Dec 31, 2024  3:32 PM Jordan [name] wrote: Medication: Oxycodone  HCl 10 MG TABS [[id number]]  Has the patient contacted their pharmacy? Yes (Agent: If no, request that the patient contact the pharmacy for the refill. If patient does not wish to contact the pharmacy document the reason why and proceed with request.) (Agent: If yes, when and what did the pharmacy advise?)  This is the patient's preferred pharmacy:  [facility] - [place], [place] - [address] [facility] [address] [place] [address] Phone: [phone] Fax: [phone]  Is this the correct pharmacy for this prescription? Yes If no, delete pharmacy and type the correct one.   Has the prescription been filled recently? Yes  Is the patient out of the medication? Yes  Has the patient been seen for an appointment in the last year OR does the patient have an upcoming appointment? Yes  Can we respond through MyChart? No  Agent: Please be advised that Rx refills may take up to 3 business days. We ask that you follow-up with your pharmacy.

## 2025-01-01 MED ORDER — OXYCODONE HCL 10 MG PO TABS: 10.0000 mg | ORAL_TABLET | Freq: Four times a day (QID) | ORAL | 0 refills | Status: DC | PRN

## 2025-01-07 ENCOUNTER — Ambulatory Visit: Admitting: Family Medicine

## 2025-01-07 ENCOUNTER — Encounter: Payer: Self-pay | Admitting: Family Medicine

## 2025-01-07 VITALS — BP 110/70 | HR 68 | Ht 66.0 in | Wt 271.4 lb

## 2025-01-07 DIAGNOSIS — R109 Unspecified abdominal pain: Secondary | ICD-10-CM

## 2025-01-07 DIAGNOSIS — Z933 Colostomy status: Secondary | ICD-10-CM

## 2025-01-07 DIAGNOSIS — G8929 Other chronic pain: Secondary | ICD-10-CM | POA: Diagnosis not present

## 2025-01-07 DIAGNOSIS — E66813 Obesity, class 3: Secondary | ICD-10-CM

## 2025-01-07 DIAGNOSIS — Z23 Encounter for immunization: Secondary | ICD-10-CM

## 2025-01-07 DIAGNOSIS — Z6841 Body Mass Index (BMI) 40.0 and over, adult: Secondary | ICD-10-CM | POA: Diagnosis not present

## 2025-01-07 MED ORDER — OXYCODONE HCL 10 MG PO TABS: 10.0000 mg | ORAL_TABLET | Freq: Four times a day (QID) | ORAL | 0 refills | Status: AC | PRN

## 2025-01-07 NOTE — Progress Notes (Signed)
" ° °  Established Patient Office Visit  Subjective   Patient ID: Jordan [name], male    DOB: 05-03-1981  Age: 44 y.o. MRN: [id number]  Chief Complaint  Patient presents with   Medical Management of Chronic Issues     History of Present Illness   Jordan [name] is a 44 year old male who presents for pre-operative evaluation and medication management prior to his upcoming surgery.  He is scheduled for a surgical reversal procedure on January 30th or 31st. He currently uses oxycodone  two to four pills daily and wants to stop it after surgery, and he wishes to avoid dose escalation.  He has nausea treated with ondansetron  about twice daily, which helps briefly then wears off, leading to repeat dosing. He takes vitamin D  every other day and an iron  pill. He also takes gabapentin  and asks if he should continue it up until surgery.  He avoids lifting and his sons help with household tasks. He has colon disease with colonoscopy in 2021, relevant to the upcoming reversal procedure.          The 10-year ASCVD risk score (Arnett DK, et al., 2019) is: 1.1%  Health Maintenance Due  Topic Date Due   Pneumococcal Vaccine (1 of 2 - PCV) Never done   Hepatitis B Vaccines 19-59 Average Risk (1 of 3 - 19+ 3-dose series) Never done   COVID-19 Vaccine (1 - 2025-26 season) Never done      Objective:     BP 110/70   Pulse 68   Ht 5' 6 (1.676 m)   Wt 271 lb 6.4 oz (123.1 kg)   SpO2 100%   BMI 43.81 kg/m    Physical Exam     Gen: alert, oriented Pulm: no respiratory distress Gi: normal appearing colostomy present.  Psych: pleasant affect       No results found for any visits on 01/07/25.      Assessment & Plan:   Colostomy present [facility]) Assessment & Plan: Colostomy reversal surgery on January 30th. Discussed surgery's impact on medication absorption and pain management. Advised against Cologuard testing pre-surgery. - Proceed with  colostomy reversal on January 30th. - Avoid Cologuard testing until post-surgery. - Schedule follow-up 3 weeks post-surgery. - discuss change/tapering down pain medications at that time.   Immunization due -     Flu vaccine trivalent PF, 6mos and older(Flulaval,Afluria,Fluarix,Fluzone)  Chronic abdominal pain Assessment & Plan: Chronic pain managed with oxycodone , aiming to taper post-surgery. Discussed buprenorphine for safer long-term use. Emphasized slow tapering to prevent withdrawal and increased pain. Discussed post-surgery absorption changes and Narcan  necessity. - Prescribed oxycodone  90 tablets for 30 days, 3 tablets daily. - Plan to taper oxycodone  post-surgery. - Consider buprenorphine if pain persists post-surgery. - Ensure Narcan  availability.   Class 3 severe obesity due to excess calories with serious comorbidity and body mass index (BMI) of 45.0 to 49.9 in adult [facility])  Other orders -     oxyCODONE  HCl; Take 1 tablet (10 mg total) by mouth every 6 (six) hours as needed. (Patient taking differently: Take 10 mg by mouth in the morning, at noon, and at bedtime.)  Dispense: 90 tablet; Refill: 0       Return in about 6 weeks (around 02/18/2025) for surgery f/u.    Toribio MARLA Slain, MD  "

## 2025-01-07 NOTE — Patient Instructions (Signed)
" °  VISIT SUMMARY: You had a pre-operative evaluation and medication management appointment in preparation for your upcoming surgery on January 30th or 31st. We discussed your current medications, pain management, and plans for after the surgery.  YOUR PLAN: CHRONIC PAIN MANAGEMENT: You are currently managing chronic pain with oxycodone  and plan to taper off after surgery. We discussed the possibility of using buprenorphine for safer long-term pain management. -Take oxycodone  as prescribed: 90 tablets for 30 days, 3 tablets daily. -Plan to taper off oxycodone  post-surgery. -Consider buprenorphine if pain persists after surgery. -Ensure Narcan  is available in case of emergency.  COLOSTOMY STATUS (PRE-REVERSAL): You are scheduled for a colostomy reversal on January 30th. We discussed how the surgery might affect medication absorption and pain management. -Proceed with colostomy reversal on January 30th. -Avoid Cologuard testing until after the surgery. -Schedule a follow-up appointment 3 weeks after surgery.  GENERAL HEALTH MAINTENANCE: We discussed your vitamin D  and iron  supplementation and the timing of Cologuard testing. -Continue taking vitamin D  weekly -Continue taking your iron  pill. -Defer Cologuard testing until after your surgery.    "

## 2025-01-07 NOTE — Assessment & Plan Note (Signed)
 Chronic pain managed with oxycodone , aiming to taper post-surgery. Discussed buprenorphine for safer long-term use. Emphasized slow tapering to prevent withdrawal and increased pain. Discussed post-surgery absorption changes and Narcan  necessity. - Prescribed oxycodone  90 tablets for 30 days, 3 tablets daily. - Plan to taper oxycodone  post-surgery. - Consider buprenorphine if pain persists post-surgery. - Ensure Narcan  availability.

## 2025-01-07 NOTE — Assessment & Plan Note (Signed)
 Colostomy reversal surgery on January 30th. Discussed surgery's impact on medication absorption and pain management. Advised against Cologuard testing pre-surgery. - Proceed with colostomy reversal on January 30th. - Avoid Cologuard testing until post-surgery. - Schedule follow-up 3 weeks post-surgery. - discuss change/tapering down pain medications at that time.

## 2025-01-10 ENCOUNTER — Ambulatory Visit: Payer: Self-pay | Admitting: Surgery

## 2025-01-10 DIAGNOSIS — R739 Hyperglycemia, unspecified: Secondary | ICD-10-CM

## 2025-01-10 NOTE — Progress Notes (Signed)
 Sent message, via epic in basket, requesting orders in epic from Careers adviser.

## 2025-01-16 NOTE — Progress Notes (Signed)
 COVID Vaccine received:  [x]  No []  Yes Date of any COVID positive Test in last 90 days:  PCP - Toribio Slain, MD clearance in 01-07-2025 Epic note Cardiologist - none Pulmonology -Harden Staff, MD   Chest x-ray - remote 2015  2v  Epic EKG - 06-26-2024  Epic  Stress Test -  ECHO - 01-30-2014 Epic Cardiac Cath -  CT Coronary Calcium score:   Pacemaker / ICD device [x]  No []  Yes   Spinal Cord Stimulator:[x]  No []  Yes       History of Sleep Apnea? []  No [x]  Yes   CPAP used?- [x]  No []  Yes    Medication on DOS: Gabapentin  (NEURONTIN ), Omeprazole (PRILOSEC) prn Albuterol  (VENTOLIN  HFA) inhaler prn  Tylenol  OR Oxycodone    Patient has: []  NO Hx DM   [x]  Pre-DM   []  DM1  []   DM2 Does the patient monitor blood sugar?   []  N/A   [x]  No []  Yes  Last A1c was:   5.6 on   06-26-24    Blood Thinner / Instructions: None  Aspirin Instructions:  ASA  Activity level: Able to walk up 2 flights of stairs without becoming significantly short of breath or having chest pain?   []    Yes   [x]  No,  would have: SOB, knee pain Patient can perform ADLs without assistance.  [x]   Yes    []  No   Anesthesia review: HTN, OSA-no CPAP , Pre-DM no meds, CPS- PCP Rx meds (no Pain Mgmt), Reactive airway disease- asthma, anxiety,   Patient denies any S&S of respiratory illness or Covid - no shortness of breath, fever, cough or chest pain at PAT appointment.  Patient verbalized understanding and agreement to the Pre-Surgical Instructions that were given to them at this PAT appointment. Patient was also educated of the need to review these PAT instructions again prior to his surgery.I reviewed the appropriate phone numbers to call if they have any and questions or concerns.

## 2025-01-16 NOTE — Patient Instructions (Addendum)
 SURGICAL WAITING ROOM VISITATION Patients having surgery or a procedure may have no more than 2 support people in the waiting area - these visitors may rotate in the visitor waiting room.   If the patient needs to stay at the hospital during part of their recovery, the visitor guidelines for inpatient rooms apply.  PRE-OP VISITATION  Pre-op nurse will coordinate an appropriate time for 1 support person to accompany the patient in pre-op.  This support person may not rotate.  This visitor will be contacted when the time is appropriate for the visitor to come back in the pre-op area.  Temporary Visitor Restrictions   Children ages 10 and under will not be able to visit patients in [facility] under most circumstances. Visitation is not restricted outside of hospitals unless noted otherwise in the [facility] and Location Specific Visitation Guidelines at :       [email or web address]  Visitors with respiratory illnesses are discouraged from visiting and should remain at home.  You are not required to quarantine at this time prior to your surgery. However, you must do this: Hand Hygiene often Do NOT share personal items Notify your provider if you are in close contact with someone who has COVID or you develop fever 100.4 or greater, new onset of sneezing, cough, sore throat, shortness of breath or body aches.  If you test positive for Covid or have been in contact with anyone that has tested positive in the last 10 days please notify you surgeon.    Your procedure is scheduled on:  Friday  01-25-2025  Report to [facility] Main Entrance: [address] entrance where the [employer] is available.   Report to admitting at:  05:15   AM  Call this number if you have any questions or problems the morning of surgery [phone]  FOLLOW ANY ADDITIONAL PRE OP INSTRUCTIONS YOU RECEIVED FROM YOUR SURGEON'S OFFICE!!!  Dulcolax 20 mg (total) - Take 4 (four) of the 5 mg Dulcolax tablets  with water at 07:00 am the day prior to surgery.  Miralax  238 g - Mix with 64 oz Gatorade/Powerade.  Starting at 10:00 am ,Drink this gradually over the next few hours (8 oz glass every 15-30 minutes) until gone the day prior to surgery You should finish in 4 hours-6 hours.    Neomycin 1000 mg - At 2 pm, 3 pm and 10 pm after Miralax   bowel prep the day prior to surgery.  Metronidazole  1000 mg - At 2 pm, 3 pm and 10 pm after Miralax  bowel prep the day prior to surgery.   Ondansetron  (Zofran ) can be used if you have any nausea from the other bowel prep medications.  Drink plenty of clear liquids all evening to avoid getting dehydrated.  DRINK two (2) bottles of Pre-Surgery G2 drink starting at 6:00 pm the evening prior to your surgery to help prevent dehydration. Increase drinking clear fluids (see list below)      Do not eat food after Midnight the night prior to your surgery/procedure.  After Midnight you may have the following liquids until  04:30  AM  DAY OF SURGERY  Clear Liquid Diet Water Black Coffee (sugar ok, NO MILK/CREAM OR CREAMERS)  Tea (sugar ok, NO MILK/CREAM OR CREAMERS) regular and decaf                             Plain Jell-O  with no fruit (NO RED)  Fruit ices (not with fruit pulp, NO RED)                                     Popsicles (NO RED)                                                                  Juice: NO CITRUS JUICES: only apple, WHITE grape, WHITE cranberry Sports drinks like Gatorade or Powerade (NO RED)                   The day of surgery:  Drink ONE (1) Pre-Surgery G2 at  04:30 AM the morning of surgery. Drink in one sitting. Do not sip.  This drink was given to you during your hospital pre-op appointment visit. Nothing else to drink after completing the Pre-Surgery G2 : No candy, chewing gum or throat lozenges.     Oral Hygiene is also important to reduce your risk of infection.        Remember - BRUSH  YOUR TEETH THE MORNING OF SURGERY WITH YOUR REGULAR TOOTHPASTE  Do NOT smoke after Midnight the night before surgery.  STOP TAKING all Vitamins, Herbs and supplements 1 week before your surgery.   Take ONLY these medicines the morning of surgery with A SIP OF WATER:  Gabapentin  (NEURONTIN ), Omeprazole (PRILOSEC) if needed. You may take EITHER  Tylenol  OR Oxycodone   if needed for pain.  You may use your Albuterol  inhaler if needed. Please bring your Inhaler with you on the day of your surgery.    If You have been diagnosed with Sleep Apnea - Bring CPAP mask and tubing day of surgery. We will provide you with a CPAP machine on the day of your surgery.                   You may not have any metal on your body including  jewelry, and body piercing  Do not wear  lotions, powders, cologne, or deodorant  Men may shave face and neck.  Contacts, Hearing Aids, dentures or bridgework may not be worn into surgery. DENTURES WILL BE REMOVED PRIOR TO SURGERY PLEASE DO NOT APPLY Poly grip OR ADHESIVES!!!  You may bring a small overnight bag with you on the day of surgery, only pack items that are not valuable. [facility] IS NOT RESPONSIBLE   FOR VALUABLES THAT ARE LOST OR STOLEN.   Do not bring your home medications to the hospital. The Pharmacy will dispense medications listed on your medication list to you during your admission in the Hospital.  Please read over the following fact sheets you were given: IF YOU HAVE QUESTIONS ABOUT YOUR PRE-OP INSTRUCTIONS, PLEASE CALL [phone]   [facility] - Preparing for Surgery           Before surgery, you can play an important role.  Because skin is not sterile, your skin needs to be as free of germs as possible.  You can reduce the number of germs on your skin by washing with CHG (chlorahexidine gluconate) soap before surgery.  CHG is an antiseptic cleaner which kills germs and bonds with the skin to continue killing germs even after  washing. Please DO  NOT use if you have an allergy to CHG or antibacterial soaps.  If your skin becomes reddened/irritated stop using the CHG and inform your nurse when you arrive at Short Stay. Do not shave (including legs and underarms) for at least 48 hours prior to the first CHG shower.  You may shave your face/neck.  Please follow these instructions carefully:  1.  Shower with CHG Soap the night before surgery ONLY (DO NOT USE THE CHG SOAP THE MORNING OF SURGERY).  2.  If you choose to wash your hair, wash your hair first as usual with your normal  shampoo.  3.  After you shampoo, rinse your hair and body thoroughly to remove the shampoo.                             4.  Use CHG as you would any other liquid soap.  You can apply chg directly to the skin and wash.  Gently with a scrungie or clean washcloth.  5.  Apply the CHG Soap to your body ONLY FROM THE NECK DOWN.   Do not use on face/ open                           Wound or open sores. Avoid contact with eyes, ears mouth and genitals (private parts).                       Wash face,  Genitals (private parts) with your normal soap.             6.  Wash thoroughly, paying special attention to the area where your  surgery  will be performed.  7.  Thoroughly rinse your body with warm water from the neck down.  8.  DO NOT shower/wash with your normal soap after using and rinsing off the CHG Soap.                9.  Pat yourself dry with a clean towel.            10.  Wear clean pajamas.            11.  Place clean sheets on your bed the night of your first shower and do not  sleep with pets.  Day of Surgery : Do not apply any CHG, lotions/deodorants the morning of surgery.  Please wear clean clothes to the hospital/surgery center.   FAILURE TO FOLLOW THESE INSTRUCTIONS MAY RESULT IN THE CANCELLATION OF YOUR SURGERY  PATIENT SIGNATURE_________________________________  NURSE  SIGNATURE__________________________________  ________________________________________________________________________      Nasario Exon    An incentive spirometer is a tool that can help keep your lungs clear and active. This tool measures how well you are filling your lungs with each breath. Taking long deep breaths may help reverse or decrease the chance of developing breathing (pulmonary) problems (especially infection) following: A long period of time when you are unable to move or be active. BEFORE THE PROCEDURE  If the spirometer includes an indicator to show your best effort, your nurse or respiratory therapist will set it to a desired goal. If possible, sit up straight or lean slightly forward. Try not to slouch. Hold the incentive spirometer in an upright position. INSTRUCTIONS FOR USE  Sit on the edge of your bed if possible, or sit up as far as you can  in bed or on a chair. Hold the incentive spirometer in an upright position. Breathe out normally. Place the mouthpiece in your mouth and seal your lips tightly around it. Breathe in slowly and as deeply as possible, raising the piston or the ball toward the top of the column. Hold your breath for 3-5 seconds or for as long as possible. Allow the piston or ball to fall to the bottom of the column. Remove the mouthpiece from your mouth and breathe out normally. Rest for a few seconds and repeat Steps 1 through 7 at least 10 times every 1-2 hours when you are awake. Take your time and take a few normal breaths between deep breaths. The spirometer may include an indicator to show your best effort. Use the indicator as a goal to work toward during each repetition. After each set of 10 deep breaths, practice coughing to be sure your lungs are clear. If you have an incision (the cut made at the time of surgery), support your incision when coughing by placing a pillow or rolled up towels firmly against it. Once you are able to  get out of bed, walk around indoors and cough well. You may stop using the incentive spirometer when instructed by your caregiver.  RISKS AND COMPLICATIONS Take your time so you do not get dizzy or light-headed. If you are in pain, you may need to take or ask for pain medication before doing incentive spirometry. It is harder to take a deep breath if you are having pain. AFTER USE Rest and breathe slowly and easily. It can be helpful to keep track of a log of your progress. Your caregiver can provide you with a simple table to help with this. If you are using the spirometer at home, follow these instructions: SEEK MEDICAL CARE IF:  You are having difficultly using the spirometer. You have trouble using the spirometer as often as instructed. Your pain medication is not giving enough relief while using the spirometer. You develop fever of 100.5 F (38.1 C) or higher.                                                                                                    SEEK IMMEDIATE MEDICAL CARE IF:  You cough up bloody sputum that had not been present before. You develop fever of 102 F (38.9 C) or greater. You develop worsening pain at or near the incision site. MAKE SURE YOU:  Understand these instructions. Will watch your condition. Will get help right away if you are not doing well or get worse. Document Released: 04/25/2007 Document Revised: 03/06/2012 Document Reviewed: 06/26/2007 [facility] Patient Information 2014 [place], [place].

## 2025-01-17 ENCOUNTER — Encounter (HOSPITAL_COMMUNITY)
Admission: RE | Admit: 2025-01-17 | Discharge: 2025-01-17 | Disposition: A | Discharge status: Home / Self Care | Source: Ambulatory Visit | Attending: Surgery | Admitting: Surgery

## 2025-01-17 ENCOUNTER — Other Ambulatory Visit: Payer: Self-pay

## 2025-01-17 ENCOUNTER — Encounter (HOSPITAL_COMMUNITY): Payer: Self-pay

## 2025-01-17 VITALS — BP 125/82 | HR 72 | Temp 98.2°F | Resp 22 | Ht 66.0 in | Wt 266.0 lb

## 2025-01-17 DIAGNOSIS — R739 Hyperglycemia, unspecified: Secondary | ICD-10-CM | POA: Insufficient documentation

## 2025-01-17 DIAGNOSIS — Z01812 Encounter for preprocedural laboratory examination: Secondary | ICD-10-CM | POA: Diagnosis present

## 2025-01-17 DIAGNOSIS — G894 Chronic pain syndrome: Secondary | ICD-10-CM | POA: Diagnosis not present

## 2025-01-17 DIAGNOSIS — I10 Essential (primary) hypertension: Secondary | ICD-10-CM | POA: Insufficient documentation

## 2025-01-17 DIAGNOSIS — Z933 Colostomy status: Secondary | ICD-10-CM | POA: Insufficient documentation

## 2025-01-17 DIAGNOSIS — Z01818 Encounter for other preprocedural examination: Secondary | ICD-10-CM

## 2025-01-17 HISTORY — DX: Chronic kidney disease, unspecified: N18.9

## 2025-01-17 HISTORY — DX: Prediabetes: R73.03

## 2025-01-17 LAB — CBC
HCT: 43.7 % (ref 39.0–52.0)
Hemoglobin: 13.2 g/dL (ref 13.0–17.0)
MCH: 26 pg (ref 26.0–34.0)
MCHC: 30.2 g/dL (ref 30.0–36.0)
MCV: 86.2 fL (ref 80.0–100.0)
Platelets: 275 K/uL (ref 150–400)
RBC: 5.07 MIL/uL (ref 4.22–5.81)
RDW: 15.9 % — ABNORMAL HIGH (ref 11.5–15.5)
WBC: 5.2 K/uL (ref 4.0–10.5)
nRBC: 0 % (ref 0.0–0.2)

## 2025-01-17 LAB — COMPREHENSIVE METABOLIC PANEL WITH GFR
ALT: 12 U/L (ref 0–44)
AST: 22 U/L (ref 15–41)
Albumin: 3.9 g/dL (ref 3.5–5.0)
Alkaline Phosphatase: 86 U/L (ref 38–126)
Anion gap: 12 (ref 5–15)
BUN: 8 mg/dL (ref 6–20)
CO2: 20 mmol/L — ABNORMAL LOW (ref 22–32)
Calcium: 9.2 mg/dL (ref 8.9–10.3)
Chloride: 104 mmol/L (ref 98–111)
Creatinine, Ser: 0.67 mg/dL (ref 0.61–1.24)
GFR, Estimated: >60 mL/min
Glucose, Bld: 87 mg/dL (ref 70–99)
Potassium: 4.4 mmol/L (ref 3.5–5.1)
Sodium: 136 mmol/L (ref 135–145)
Total Bilirubin: 0.3 mg/dL (ref 0.0–1.2)
Total Protein: 6.9 g/dL (ref 6.5–8.1)

## 2025-01-17 LAB — HEMOGLOBIN A1C
Hgb A1c MFr Bld: 5.7 % — ABNORMAL HIGH (ref 4.8–5.6)
Mean Plasma Glucose: 116.89 mg/dL

## 2025-01-24 NOTE — Anesthesia Preprocedure Evaluation (Addendum)
 "                                  Anesthesia Evaluation  Patient identified by MRN, date of birth, ID band Patient awake    Reviewed: Allergy & Precautions, NPO status , Patient's Chart, lab work & pertinent test results, reviewed documented beta blocker date and time   Airway Mallampati: III       Dental no notable dental hx. (+) Teeth Intact, Dental Advisory Given   Pulmonary asthma , sleep apnea and Continuous Positive Airway Pressure Ventilation , former smoker   Pulmonary exam normal breath sounds clear to auscultation       Cardiovascular hypertension, Pt. on medications Normal cardiovascular exam Rhythm:Regular Rate:Normal  Echo 01/30/14 Left ventricle: The cavity size was normal. Systolic  function was mildly reduced. The estimated ejection fraction  was in the range of 45% to 50%. Diffuse hypokinesis. Left  ventricular diastolic function parameters were normal.   EKG 06/26/24 Sinus arrhythmia 64/min, non specific IV conduction delay , consider atypical incomplete RBBB   Neuro/Psych  PSYCHIATRIC DISORDERS Anxiety Depression    negative neurological ROS     GI/Hepatic Neg liver ROS, Bowel prep,GERD  Medicated,,Colostomy of colon resectionb Desire for ostomy takedown    Endo/Other    Class 3 obesityHLD  Renal/GU Renal diseaseLab Results      Component                Value               Date                      NA                       136                 01/17/2025                CL                       104                 01/17/2025                K                        4.4                 01/17/2025                CO2                      20 (L)              01/17/2025                BUN                      8                   01/17/2025                CREATININE               0.67  01/17/2025                GFRNONAA                 >60                 01/17/2025                CALCIUM                   9.2                  01/17/2025                PHOS                     3.5                 07/06/2024                ALBUMIN                   3.9                 01/17/2025                GLUCOSE                  87                  01/17/2025             negative genitourinary   Musculoskeletal  (+) Arthritis , Osteoarthritis,    Abdominal  (+) + obese  Peds  Hematology Lab Results      Component                Value               Date                      WBC                      5.2                 01/17/2025                HGB                      13.2                01/17/2025                HCT                      43.7                01/17/2025                MCV                      86.2                01/17/2025                PLT                      275  01/17/2025              Anesthesia Other Findings   Reproductive/Obstetrics                              Anesthesia Physical Anesthesia Plan  ASA: 3  Anesthesia Plan: General   Post-op Pain Management: Dilaudid  IV, Precedex  and Ofirmev  IV (intra-op)*   Induction: Intravenous and Cricoid pressure planned  PONV Risk Score and Plan: 4 or greater and Treatment may vary due to age or medical condition, Midazolam , Ondansetron , Dexamethasone  and Scopolamine  patch - Pre-op  Airway Management Planned: Oral ETT  Additional Equipment: None  Intra-op Plan:   Post-operative Plan: Extubation in OR  Informed Consent: I have reviewed the patients History and Physical, chart, labs and discussed the procedure including the risks, benefits and alternatives for the proposed anesthesia with the patient or authorized representative who has indicated his/her understanding and acceptance.     Dental advisory given  Plan Discussed with: CRNA and Anesthesiologist  Anesthesia Plan Comments:          Anesthesia Quick Evaluation  "

## 2025-01-25 ENCOUNTER — Inpatient Hospital Stay (HOSPITAL_COMMUNITY): Payer: Self-pay | Admitting: Anesthesiology

## 2025-01-25 ENCOUNTER — Encounter (HOSPITAL_COMMUNITY): Payer: Self-pay | Admitting: Surgery

## 2025-01-25 ENCOUNTER — Other Ambulatory Visit: Payer: Self-pay

## 2025-01-25 ENCOUNTER — Encounter (HOSPITAL_COMMUNITY): Admission: RE | Disposition: A | Payer: Self-pay | Source: Home / Self Care | Attending: Surgery

## 2025-01-25 ENCOUNTER — Inpatient Hospital Stay (HOSPITAL_COMMUNITY)
Admission: RE | Admit: 2025-01-25 | Discharge: 2025-01-30 | DRG: 330 | Disposition: A | Attending: Surgery | Admitting: Surgery

## 2025-01-25 DIAGNOSIS — Z8249 Family history of ischemic heart disease and other diseases of the circulatory system: Secondary | ICD-10-CM

## 2025-01-25 DIAGNOSIS — Z433 Encounter for attention to colostomy: Principal | ICD-10-CM

## 2025-01-25 DIAGNOSIS — F419 Anxiety disorder, unspecified: Secondary | ICD-10-CM | POA: Diagnosis present

## 2025-01-25 DIAGNOSIS — Z79899 Other long term (current) drug therapy: Secondary | ICD-10-CM

## 2025-01-25 DIAGNOSIS — J45909 Unspecified asthma, uncomplicated: Secondary | ICD-10-CM | POA: Diagnosis present

## 2025-01-25 DIAGNOSIS — Z96651 Presence of right artificial knee joint: Secondary | ICD-10-CM | POA: Diagnosis present

## 2025-01-25 DIAGNOSIS — R030 Elevated blood-pressure reading, without diagnosis of hypertension: Secondary | ICD-10-CM | POA: Insufficient documentation

## 2025-01-25 DIAGNOSIS — Z87891 Personal history of nicotine dependence: Secondary | ICD-10-CM

## 2025-01-25 DIAGNOSIS — M2550 Pain in unspecified joint: Secondary | ICD-10-CM | POA: Diagnosis present

## 2025-01-25 DIAGNOSIS — Z5941 Food insecurity: Secondary | ICD-10-CM

## 2025-01-25 DIAGNOSIS — K660 Peritoneal adhesions (postprocedural) (postinfection): Secondary | ICD-10-CM

## 2025-01-25 DIAGNOSIS — I160 Hypertensive urgency: Secondary | ICD-10-CM | POA: Diagnosis present

## 2025-01-25 DIAGNOSIS — Z5948 Other specified lack of adequate food: Secondary | ICD-10-CM

## 2025-01-25 DIAGNOSIS — E876 Hypokalemia: Secondary | ICD-10-CM | POA: Diagnosis present

## 2025-01-25 DIAGNOSIS — I10 Essential (primary) hypertension: Secondary | ICD-10-CM | POA: Diagnosis present

## 2025-01-25 DIAGNOSIS — Z882 Allergy status to sulfonamides status: Secondary | ICD-10-CM

## 2025-01-25 DIAGNOSIS — Z79891 Long term (current) use of opiate analgesic: Secondary | ICD-10-CM

## 2025-01-25 DIAGNOSIS — K5732 Diverticulitis of large intestine without perforation or abscess without bleeding: Principal | ICD-10-CM | POA: Diagnosis present

## 2025-01-25 DIAGNOSIS — K219 Gastro-esophageal reflux disease without esophagitis: Secondary | ICD-10-CM | POA: Diagnosis present

## 2025-01-25 DIAGNOSIS — Z8719 Personal history of other diseases of the digestive system: Secondary | ICD-10-CM

## 2025-01-25 DIAGNOSIS — R7303 Prediabetes: Secondary | ICD-10-CM | POA: Diagnosis present

## 2025-01-25 DIAGNOSIS — E872 Acidosis, unspecified: Secondary | ICD-10-CM | POA: Diagnosis present

## 2025-01-25 DIAGNOSIS — Z6841 Body Mass Index (BMI) 40.0 and over, adult: Secondary | ICD-10-CM

## 2025-01-25 DIAGNOSIS — E441 Mild protein-calorie malnutrition: Secondary | ICD-10-CM | POA: Diagnosis present

## 2025-01-25 DIAGNOSIS — G894 Chronic pain syndrome: Secondary | ICD-10-CM | POA: Diagnosis present

## 2025-01-25 DIAGNOSIS — F1190 Opioid use, unspecified, uncomplicated: Secondary | ICD-10-CM | POA: Diagnosis present

## 2025-01-25 DIAGNOSIS — Z888 Allergy status to other drugs, medicaments and biological substances status: Secondary | ICD-10-CM

## 2025-01-25 DIAGNOSIS — Z7982 Long term (current) use of aspirin: Secondary | ICD-10-CM

## 2025-01-25 DIAGNOSIS — Z560 Unemployment, unspecified: Secondary | ICD-10-CM

## 2025-01-25 DIAGNOSIS — Z881 Allergy status to other antibiotic agents status: Secondary | ICD-10-CM

## 2025-01-25 DIAGNOSIS — F32A Depression, unspecified: Secondary | ICD-10-CM | POA: Diagnosis present

## 2025-01-25 DIAGNOSIS — K435 Parastomal hernia without obstruction or  gangrene: Secondary | ICD-10-CM | POA: Diagnosis present

## 2025-01-25 DIAGNOSIS — G4733 Obstructive sleep apnea (adult) (pediatric): Secondary | ICD-10-CM | POA: Diagnosis present

## 2025-01-25 DIAGNOSIS — K567 Ileus, unspecified: Secondary | ICD-10-CM | POA: Diagnosis not present

## 2025-01-25 LAB — TYPE AND SCREEN
ABO/RH(D): A POS
Antibody Screen: NEGATIVE

## 2025-01-25 MED ORDER — PHENYLEPHRINE HCL-NACL 20-0.9 MG/250ML-% IV SOLN
INTRAVENOUS | Status: AC
  Filled 2025-01-25: qty 250

## 2025-01-25 MED ORDER — DROPERIDOL 2.5 MG/ML IJ SOLN
INTRAMUSCULAR | Status: AC
  Filled 2025-01-25: qty 2

## 2025-01-25 MED ORDER — LABETALOL HCL 5 MG/ML IV SOLN
INTRAVENOUS | Status: AC
  Filled 2025-01-25: qty 4

## 2025-01-25 MED ORDER — BISACODYL 5 MG PO TBEC: 20.0000 mg | DELAYED_RELEASE_TABLET | Freq: Once | ORAL | Status: DC

## 2025-01-25 MED ORDER — ROCURONIUM BROMIDE 10 MG/ML (PF) SYRINGE
PREFILLED_SYRINGE | INTRAVENOUS | Status: AC
  Filled 2025-01-25: qty 10

## 2025-01-25 MED ORDER — PROCHLORPERAZINE EDISYLATE 10 MG/2ML IJ SOLN
5.0000 mg | Freq: Four times a day (QID) | INTRAMUSCULAR | Status: DC | PRN
  Administered 2025-01-26 – 2025-01-28 (×3): 10 mg via INTRAVENOUS
  Filled 2025-01-25 (×3): qty 2

## 2025-01-25 MED ORDER — SODIUM CHLORIDE 0.9 % IV SOLN: 250.0000 mL | INTRAVENOUS | Status: DC | PRN

## 2025-01-25 MED ORDER — MAGIC MOUTHWASH: 15.0000 mL | Freq: Four times a day (QID) | ORAL | Status: DC | PRN

## 2025-01-25 MED ORDER — STERILE WATER FOR INJECTION IJ SOLN
INTRAMUSCULAR | Status: DC | PRN
  Administered 2025-01-25: 20 mL

## 2025-01-25 MED ORDER — OXYCODONE HCL 5 MG/5ML PO SOLN: 5.0000 mg | Freq: Once | ORAL | Status: DC | PRN

## 2025-01-25 MED ORDER — HYDROMORPHONE HCL 1 MG/ML IJ SOLN
INTRAMUSCULAR | Status: AC
  Filled 2025-01-25: qty 1

## 2025-01-25 MED ORDER — ONDANSETRON HCL 4 MG/2ML IJ SOLN
INTRAMUSCULAR | Status: AC
  Filled 2025-01-25: qty 2

## 2025-01-25 MED ORDER — NAPHAZOLINE-GLYCERIN 0.012-0.25 % OP SOLN: 1.0000 [drp] | Freq: Four times a day (QID) | OPHTHALMIC | Status: DC | PRN

## 2025-01-25 MED ORDER — CALCIUM POLYCARBOPHIL 625 MG PO TABS
625.0000 mg | ORAL_TABLET | Freq: Two times a day (BID) | ORAL | Status: DC
  Administered 2025-01-25 – 2025-01-30 (×9): 625 mg via ORAL
  Filled 2025-01-25 (×10): qty 1

## 2025-01-25 MED ORDER — HYDROMORPHONE HCL 1 MG/ML IJ SOLN
INTRAMUSCULAR | Status: DC | PRN
  Administered 2025-01-25 (×4): .5 mg via INTRAVENOUS

## 2025-01-25 MED ORDER — OXYCODONE-ACETAMINOPHEN 5-325 MG PO TABS: 1.0000 | ORAL_TABLET | Freq: Four times a day (QID) | ORAL | 0 refills | Status: DC | PRN

## 2025-01-25 MED ORDER — ENOXAPARIN SODIUM 40 MG/0.4ML IJ SOSY
40.0000 mg | PREFILLED_SYRINGE | Freq: Once | INTRAMUSCULAR | Status: AC
  Administered 2025-01-25: 40 mg via SUBCUTANEOUS
  Filled 2025-01-25: qty 0.4

## 2025-01-25 MED ORDER — SODIUM CHLORIDE 0.9 % IV SOLN
2.0000 g | Freq: Two times a day (BID) | INTRAVENOUS | Status: AC
  Administered 2025-01-25: 2 g via INTRAVENOUS
  Filled 2025-01-25: qty 2

## 2025-01-25 MED ORDER — PROPOFOL 10 MG/ML IV BOLUS
INTRAVENOUS | Status: AC
  Filled 2025-01-25: qty 20

## 2025-01-25 MED ORDER — EPHEDRINE 5 MG/ML INJ
INTRAVENOUS | Status: AC
  Filled 2025-01-25: qty 5

## 2025-01-25 MED ORDER — DIPHENHYDRAMINE HCL 50 MG/ML IJ SOLN
INTRAMUSCULAR | Status: DC | PRN
  Administered 2025-01-25: 12.5 mg via INTRAVENOUS

## 2025-01-25 MED ORDER — HYDROMORPHONE HCL 1 MG/ML IJ SOLN
0.2500 mg | INTRAMUSCULAR | Status: DC | PRN
  Administered 2025-01-25 (×3): 0.5 mg via INTRAVENOUS

## 2025-01-25 MED ORDER — LACTATED RINGERS IR SOLN
Status: DC | PRN
  Administered 2025-01-25: 1000 mL

## 2025-01-25 MED ORDER — OXYCODONE HCL 5 MG PO TABS: 5.0000 mg | ORAL_TABLET | Freq: Once | ORAL | Status: DC | PRN

## 2025-01-25 MED ORDER — KETOROLAC TROMETHAMINE 30 MG/ML IJ SOLN
INTRAMUSCULAR | Status: DC | PRN
  Administered 2025-01-25: 30 mg via INTRAVENOUS

## 2025-01-25 MED ORDER — ENOXAPARIN SODIUM 40 MG/0.4ML IJ SOSY
40.0000 mg | PREFILLED_SYRINGE | INTRAMUSCULAR | Status: DC
  Administered 2025-01-26 – 2025-01-30 (×5): 40 mg via SUBCUTANEOUS
  Filled 2025-01-25 (×5): qty 0.4

## 2025-01-25 MED ORDER — POLYETHYLENE GLYCOL 3350 17 GM/SCOOP PO POWD: 238.0000 g | Freq: Once | ORAL | Status: DC

## 2025-01-25 MED ORDER — LACTATED RINGERS IV BOLUS: 1000.0000 mL | Freq: Three times a day (TID) | INTRAVENOUS | Status: AC | PRN

## 2025-01-25 MED ORDER — ALVIMOPAN 12 MG PO CAPS
12.0000 mg | ORAL_CAPSULE | Freq: Two times a day (BID) | ORAL | Status: DC
  Administered 2025-01-26 – 2025-01-27 (×3): 12 mg via ORAL
  Filled 2025-01-25 (×3): qty 1

## 2025-01-25 MED ORDER — GABAPENTIN 100 MG PO CAPS
200.0000 mg | ORAL_CAPSULE | Freq: Every day | ORAL | Status: DC
  Administered 2025-01-25 – 2025-01-26 (×2): 200 mg via ORAL
  Filled 2025-01-25 (×2): qty 2

## 2025-01-25 MED ORDER — SIMETHICONE 80 MG PO CHEW: 40.0000 mg | CHEWABLE_TABLET | Freq: Four times a day (QID) | ORAL | Status: DC | PRN

## 2025-01-25 MED ORDER — FENTANYL CITRATE (PF) 100 MCG/2ML IJ SOLN
INTRAMUSCULAR | Status: DC | PRN
  Administered 2025-01-25 (×4): 50 ug via INTRAVENOUS

## 2025-01-25 MED ORDER — LIDOCAINE HCL (PF) 2 % IJ SOLN
INTRAMUSCULAR | Status: DC | PRN
  Administered 2025-01-25: 1.5 mg/kg/h via INTRADERMAL

## 2025-01-25 MED ORDER — DROPERIDOL 2.5 MG/ML IJ SOLN
0.6250 mg | Freq: Once | INTRAMUSCULAR | Status: AC | PRN
  Administered 2025-01-25: 0.625 mg via INTRAVENOUS

## 2025-01-25 MED ORDER — PHENYLEPHRINE 80 MCG/ML (10ML) SYRINGE FOR IV PUSH (FOR BLOOD PRESSURE SUPPORT)
PREFILLED_SYRINGE | INTRAVENOUS | Status: AC
  Filled 2025-01-25: qty 10

## 2025-01-25 MED ORDER — SCOPOLAMINE 1 MG/3DAYS TD PT72
MEDICATED_PATCH | TRANSDERMAL | Status: AC
  Filled 2025-01-25: qty 1

## 2025-01-25 MED ORDER — ALUM & MAG HYDROXIDE-SIMETH 200-200-20 MG/5ML PO SUSP
30.0000 mL | Freq: Four times a day (QID) | ORAL | Status: DC | PRN
  Filled 2025-01-25: qty 30

## 2025-01-25 MED ORDER — SUGAMMADEX SODIUM 200 MG/2ML IV SOLN
INTRAVENOUS | Status: DC | PRN
  Administered 2025-01-25: 450 mg via INTRAVENOUS

## 2025-01-25 MED ORDER — LABETALOL HCL 5 MG/ML IV SOLN
10.0000 mg | INTRAVENOUS | Status: DC | PRN
  Administered 2025-01-25: 10 mg via INTRAVENOUS

## 2025-01-25 MED ORDER — SODIUM CHLORIDE 0.9 % IV SOLN: Freq: Three times a day (TID) | INTRAVENOUS | Status: AC | PRN

## 2025-01-25 MED ORDER — FENTANYL CITRATE (PF) 100 MCG/2ML IJ SOLN
INTRAMUSCULAR | Status: AC
  Filled 2025-01-25: qty 2

## 2025-01-25 MED ORDER — PROCHLORPERAZINE MALEATE 10 MG PO TABS
10.0000 mg | ORAL_TABLET | Freq: Four times a day (QID) | ORAL | Status: DC | PRN
  Administered 2025-01-27: 10 mg via ORAL
  Filled 2025-01-25 (×2): qty 1

## 2025-01-25 MED ORDER — LIDOCAINE HCL (PF) 2 % IJ SOLN
INTRAMUSCULAR | Status: AC
  Filled 2025-01-25: qty 5

## 2025-01-25 MED ORDER — LIDOCAINE HCL 2 % IJ SOLN
INTRAMUSCULAR | Status: AC
  Filled 2025-01-25: qty 20

## 2025-01-25 MED ORDER — ACETAMINOPHEN 500 MG PO TABS
1000.0000 mg | ORAL_TABLET | Freq: Four times a day (QID) | ORAL | Status: DC
  Administered 2025-01-25 – 2025-01-26 (×6): 1000 mg via ORAL
  Filled 2025-01-25 (×7): qty 2

## 2025-01-25 MED ORDER — ALVIMOPAN 12 MG PO CAPS
12.0000 mg | ORAL_CAPSULE | ORAL | Status: AC
  Administered 2025-01-25: 12 mg via ORAL
  Filled 2025-01-25: qty 1

## 2025-01-25 MED ORDER — DEXAMETHASONE SOD PHOSPHATE PF 10 MG/ML IJ SOLN
INTRAMUSCULAR | Status: AC
  Filled 2025-01-25: qty 1

## 2025-01-25 MED ORDER — STERILE WATER FOR INJECTION IJ SOLN
INTRAMUSCULAR | Status: AC
  Filled 2025-01-25: qty 10

## 2025-01-25 MED ORDER — ONDANSETRON HCL 4 MG/2ML IJ SOLN
4.0000 mg | Freq: Four times a day (QID) | INTRAMUSCULAR | Status: DC | PRN
  Administered 2025-01-25 – 2025-01-30 (×8): 4 mg via INTRAVENOUS
  Filled 2025-01-25 (×8): qty 2

## 2025-01-25 MED ORDER — BUPIVACAINE LIPOSOME 1.3 % IJ SUSP: 20.0000 mL | Freq: Once | INTRAMUSCULAR | Status: DC

## 2025-01-25 MED ORDER — HYDRALAZINE HCL 20 MG/ML IJ SOLN
INTRAMUSCULAR | Status: DC | PRN
  Administered 2025-01-25 (×3): 5 mg via INTRAVENOUS

## 2025-01-25 MED ORDER — NEOMYCIN SULFATE 500 MG PO TABS: 1000.0000 mg | ORAL_TABLET | ORAL | Status: DC

## 2025-01-25 MED ORDER — DEXAMETHASONE SOD PHOSPHATE PF 10 MG/ML IJ SOLN
INTRAMUSCULAR | Status: DC | PRN
  Administered 2025-01-25: 10 mg via INTRAVENOUS

## 2025-01-25 MED ORDER — DEXMEDETOMIDINE HCL IN NACL 80 MCG/20ML IV SOLN
INTRAVENOUS | Status: AC
  Filled 2025-01-25: qty 20

## 2025-01-25 MED ORDER — HYDRALAZINE HCL 20 MG/ML IJ SOLN
INTRAMUSCULAR | Status: AC
  Filled 2025-01-25: qty 1

## 2025-01-25 MED ORDER — SODIUM CHLORIDE 0.9 % IV SOLN
2.0000 g | INTRAVENOUS | Status: AC
  Administered 2025-01-25: 2 g via INTRAVENOUS
  Filled 2025-01-25: qty 2

## 2025-01-25 MED ORDER — ENSURE SURGERY PO LIQD
237.0000 mL | Freq: Two times a day (BID) | ORAL | Status: DC
  Administered 2025-01-26 – 2025-01-28 (×2): 237 mL via ORAL

## 2025-01-25 MED ORDER — MENTHOL 3 MG MT LOZG: 1.0000 | LOZENGE | OROMUCOSAL | Status: DC | PRN

## 2025-01-25 MED ORDER — MELATONIN 3 MG PO TABS
3.0000 mg | ORAL_TABLET | Freq: Every evening | ORAL | Status: DC | PRN
  Administered 2025-01-28: 3 mg via ORAL
  Filled 2025-01-25: qty 1

## 2025-01-25 MED ORDER — ORAL CARE MOUTH RINSE: 15.0000 mL | Freq: Once | OROMUCOSAL | Status: AC

## 2025-01-25 MED ORDER — SODIUM CHLORIDE 0.9 % IR SOLN
Status: DC | PRN
  Administered 2025-01-25: 1000 mL via INTRAVESICAL

## 2025-01-25 MED ORDER — ROCURONIUM BROMIDE 100 MG/10ML IV SOLN
INTRAVENOUS | Status: DC | PRN
  Administered 2025-01-25: 80 mg via INTRAVENOUS
  Administered 2025-01-25: 10 mg via INTRAVENOUS
  Administered 2025-01-25 (×4): 20 mg via INTRAVENOUS

## 2025-01-25 MED ORDER — METOPROLOL TARTRATE 5 MG/5ML IV SOLN
5.0000 mg | Freq: Four times a day (QID) | INTRAVENOUS | Status: DC | PRN
  Administered 2025-01-25: 5 mg via INTRAVENOUS
  Filled 2025-01-25: qty 5

## 2025-01-25 MED ORDER — SODIUM CHLORIDE 0.9% FLUSH
3.0000 mL | Freq: Two times a day (BID) | INTRAVENOUS | Status: DC
  Administered 2025-01-25 – 2025-01-30 (×8): 3 mL via INTRAVENOUS

## 2025-01-25 MED ORDER — ONDANSETRON HCL 4 MG/2ML IJ SOLN: 4.0000 mg | Freq: Once | INTRAMUSCULAR | Status: DC | PRN

## 2025-01-25 MED ORDER — KCL IN DEXTROSE-NACL 20-5-0.45 MEQ/L-%-% IV SOLN
INTRAVENOUS | Status: AC
  Filled 2025-01-25: qty 1000

## 2025-01-25 MED ORDER — GABAPENTIN 300 MG PO CAPS
300.0000 mg | ORAL_CAPSULE | ORAL | Status: AC
  Administered 2025-01-25: 300 mg via ORAL
  Filled 2025-01-25: qty 1

## 2025-01-25 MED ORDER — SUCCINYLCHOLINE CHLORIDE 200 MG/10ML IV SOSY
PREFILLED_SYRINGE | INTRAVENOUS | Status: AC
  Filled 2025-01-25: qty 10

## 2025-01-25 MED ORDER — CHLORHEXIDINE GLUCONATE CLOTH 2 % EX PADS: 6.0000 | MEDICATED_PAD | Freq: Once | CUTANEOUS | Status: DC

## 2025-01-25 MED ORDER — ENSURE PRE-SURGERY PO LIQD: 296.0000 mL | Freq: Once | ORAL | Status: DC

## 2025-01-25 MED ORDER — METRONIDAZOLE 500 MG PO TABS: 1000.0000 mg | ORAL_TABLET | ORAL | Status: DC

## 2025-01-25 MED ORDER — PROPOFOL 10 MG/ML IV BOLUS
INTRAVENOUS | Status: DC | PRN
  Administered 2025-01-25: 200 mg via INTRAVENOUS

## 2025-01-25 MED ORDER — SODIUM CHLORIDE 0.9% FLUSH: 3.0000 mL | INTRAVENOUS | Status: DC | PRN

## 2025-01-25 MED ORDER — HYDRALAZINE HCL 20 MG/ML IJ SOLN
10.0000 mg | INTRAMUSCULAR | Status: DC | PRN
  Administered 2025-01-25: 10 mg via INTRAVENOUS
  Filled 2025-01-25: qty 1

## 2025-01-25 MED ORDER — HYDROMORPHONE HCL 2 MG/ML IJ SOLN
INTRAMUSCULAR | Status: AC
  Filled 2025-01-25: qty 1

## 2025-01-25 MED ORDER — ACETAMINOPHEN 10 MG/ML IV SOLN: INTRAVENOUS | Status: DC | PRN

## 2025-01-25 MED ORDER — KETOROLAC TROMETHAMINE 30 MG/ML IJ SOLN
INTRAMUSCULAR | Status: AC
  Filled 2025-01-25: qty 1

## 2025-01-25 MED ORDER — TRAMADOL HCL 50 MG PO TABS
50.0000 mg | ORAL_TABLET | Freq: Four times a day (QID) | ORAL | Status: DC | PRN
  Administered 2025-01-25 – 2025-01-26 (×2): 100 mg via ORAL
  Filled 2025-01-25 (×2): qty 2

## 2025-01-25 MED ORDER — ACETAMINOPHEN 500 MG PO TABS
1000.0000 mg | ORAL_TABLET | ORAL | Status: AC
  Administered 2025-01-25: 1000 mg via ORAL
  Filled 2025-01-25: qty 2

## 2025-01-25 MED ORDER — LACTATED RINGERS IV SOLN: INTRAVENOUS | Status: DC

## 2025-01-25 MED ORDER — MIDAZOLAM HCL 2 MG/2ML IJ SOLN
INTRAMUSCULAR | Status: AC
  Filled 2025-01-25: qty 2

## 2025-01-25 MED ORDER — MIDAZOLAM HCL 5 MG/5ML IJ SOLN
INTRAMUSCULAR | Status: DC | PRN
  Administered 2025-01-25: 2 mg via INTRAVENOUS

## 2025-01-25 MED ORDER — LABETALOL HCL 5 MG/ML IV SOLN
INTRAVENOUS | Status: DC | PRN
  Administered 2025-01-25 (×3): 5 mg via INTRAVENOUS

## 2025-01-25 MED ORDER — CHLORHEXIDINE GLUCONATE 0.12 % MT SOLN
15.0000 mL | Freq: Once | OROMUCOSAL | Status: AC
  Administered 2025-01-25: 15 mL via OROMUCOSAL

## 2025-01-25 MED ORDER — ACETAMINOPHEN 10 MG/ML IV SOLN
INTRAVENOUS | Status: AC
  Filled 2025-01-25: qty 100

## 2025-01-25 MED ORDER — HYDROMORPHONE HCL 1 MG/ML IJ SOLN
0.5000 mg | INTRAMUSCULAR | Status: DC | PRN
  Administered 2025-01-25 – 2025-01-26 (×3): 1 mg via INTRAVENOUS
  Filled 2025-01-25 (×3): qty 1

## 2025-01-25 MED ORDER — SCOPOLAMINE 1 MG/3DAYS TD PT72
MEDICATED_PATCH | TRANSDERMAL | Status: DC | PRN
  Administered 2025-01-25: 1 via TRANSDERMAL

## 2025-01-25 MED ORDER — DIPHENHYDRAMINE HCL 12.5 MG/5ML PO ELIX: 12.5000 mg | ORAL_SOLUTION | Freq: Four times a day (QID) | ORAL | Status: DC | PRN

## 2025-01-25 MED ORDER — DIPHENHYDRAMINE HCL 50 MG/ML IJ SOLN: 12.5000 mg | Freq: Four times a day (QID) | INTRAMUSCULAR | Status: DC | PRN

## 2025-01-25 MED ORDER — ENSURE PRE-SURGERY PO LIQD: 592.0000 mL | Freq: Once | ORAL | Status: DC

## 2025-01-25 MED ORDER — ONDANSETRON HCL 4 MG/2ML IJ SOLN
INTRAMUSCULAR | Status: DC | PRN
  Administered 2025-01-25: 4 mg via INTRAVENOUS

## 2025-01-25 MED ORDER — LABETALOL HCL 5 MG/ML IV SOLN
10.0000 mg | Freq: Once | INTRAVENOUS | Status: AC
  Administered 2025-01-25: 10 mg via INTRAVENOUS

## 2025-01-25 MED ORDER — BUPIVACAINE-EPINEPHRINE (PF) 0.25% -1:200000 IJ SOLN
INTRAMUSCULAR | Status: DC | PRN
  Administered 2025-01-25: 60 mL

## 2025-01-25 MED ORDER — SUGAMMADEX SODIUM 200 MG/2ML IV SOLN
INTRAVENOUS | Status: AC
  Filled 2025-01-25: qty 2

## 2025-01-25 MED ORDER — DIPHENHYDRAMINE HCL 50 MG/ML IJ SOLN
INTRAMUSCULAR | Status: AC
  Filled 2025-01-25: qty 1

## 2025-01-25 MED ORDER — BUPIVACAINE-EPINEPHRINE (PF) 0.25% -1:200000 IJ SOLN
INTRAMUSCULAR | Status: AC
  Filled 2025-01-25: qty 60

## 2025-01-25 MED ORDER — PHENOL 1.4 % MT LIQD: 2.0000 | OROMUCOSAL | Status: DC | PRN

## 2025-01-25 MED ORDER — LIDOCAINE HCL (CARDIAC) PF 100 MG/5ML IV SOSY
PREFILLED_SYRINGE | INTRAVENOUS | Status: DC | PRN
  Administered 2025-01-25: 40 mg via INTRAVENOUS

## 2025-01-25 MED ORDER — SALINE SPRAY 0.65 % NA SOLN: 1.0000 | Freq: Four times a day (QID) | NASAL | Status: DC | PRN

## 2025-01-25 MED ORDER — ONDANSETRON HCL 4 MG PO TABS
4.0000 mg | ORAL_TABLET | Freq: Four times a day (QID) | ORAL | Status: DC | PRN
  Administered 2025-01-29: 4 mg via ORAL
  Filled 2025-01-25 (×2): qty 1

## 2025-01-25 NOTE — Anesthesia Postprocedure Evaluation (Signed)
"   Anesthesia Post Note  Patient: Jordan [name]  Procedure(s) Performed: CLOSURE, COLOSTOMY, ROBOT-ASSISTED REPAIR, HERNIA, PARASTOMAL LYSIS, ADHESIONS, LAPAROSCOPIC SIGMOIDOSCOPY, FLEXIBLE CYSTOSCOPY WITH INDOCYANINE GREEN  IMAGING (ICG)     Patient location during evaluation: PACU Anesthesia Type: General Level of consciousness: awake and alert and oriented Pain management: pain level controlled Vital Signs Assessment: post-procedure vital signs reviewed and stable Respiratory status: spontaneous breathing, nonlabored ventilation and respiratory function stable Cardiovascular status: blood pressure returned to baseline and stable Postop Assessment: no apparent nausea or vomiting Anesthetic complications: no   No notable events documented.  Last Vitals:  Vitals:   01/25/25 1500 01/25/25 1515  BP: (!) 175/99 (!) 178/93  Pulse: (!) 59 (!) 58  Resp: 16 18  Temp:    SpO2: 100% 100%    Last Pain:  Vitals:   01/25/25 1515  TempSrc:   PainSc: Asleep   Pain Goal:                   Kinya Meine A.      "

## 2025-01-25 NOTE — H&P (Signed)
 01/25/2025      PATIENT NAME: Jordan [name] MRN: [id number] DOB: October 02, 1981 PHYSICIANS:  REFERRING PHYSICIAN: Curvin Deward Senior, MD  CARE TEAM:  Patient Care Team: [name], MD as PCP - General (Family Medicine)  CONSULTING PROVIDER: ELSPETH JUDAH SCHULTZE, MD  SUBJECTIVE   Chief Complaint: NEW PROBLEM   Jordan [name] is a 44 y.o. male  who is seen today as an office consultation  at the request of DrRONITA Curvin  for evaluation of parastomal hernia   History of Present Illness:  44 year old male. Had diverticulitis with a colovesical fistula. Was treated at [facility]. Underwent a robotic takedown colovesical fistula with colectomy with anastomosis. Developed anastomotic leak and had to go back for a [employer] colostomy. This is in 2021. I think there was hesitancy to take his colostomy down. He was referred to Dr. Darral at [facility]. Patient had a rather shrunken and scarred ostomy. This was dilated up and improved. He recalls being told he needed to lose weight before considering colostomy takedown. He went down from around 350 to almost 250. They still were hesitant to do it.  Patient's had swelling around his colostomy appliance. Known parastomal hernia for a while. Was admitted at [facility] in 2021 with a parastomal hernia. It seemed reducible and was no longer obstructing. Recommended can consider follow-up for surgery. He had worsening pain and developed a bowel obstruction with bowel incarcerated in the hernia. He was admitted and underwent urgent surgery later that day. Found to have necrotic bowel that required small bowel resection through midline incision. Suture repair of the ostomy. That was 06/26/2024. Followed up with our group. His midline wound has nearly closed down. However got some lumpiness and swelling at his colostomy again. CAT scan noted a knuckle of small bowel within it concerning for recurrent parastomal hernia. Internally referred to  colorectal surgery.  Patient comes today by himself. He has had some chronic joint pain and other issues. He is not working right now and is on disability. His weight has gone back up but not as high as the mid 300s. He takes an occasional oxycodone  but is trying to transition off that to nonnarcotic pain regimen. He claims he can walk about a mile. He tries to do that a few times a week. Had have a knee replacement that was underwhelming by [facility] in the past year. He empties his colostomy to a bag usually about 2 times a day. He occasionally gets constipated though. He is been told he can take a laxative to help that out but is not consistently on it. He has no more pneumaturia nocturia or dysuria. He had a colonoscopy in 2021 at [facility] that was completely normal except for some mild diverticulosis. No polyps or tumors. He does not smoke any tobacco. Does not vape. Occasional marijuana. No other drugs. No cardiac or pulmonary issues.  Medical History:  Past Medical History:  Diagnosis Date  Anxiety  Asthma, unspecified asthma severity, unspecified whether complicated, unspecified whether persistent (HHS-HCC)  Depression  GERD (gastroesophageal reflux disease)  Memory loss  Osteoarthritis  Sleep apnea   Patient Active Problem List  Diagnosis  Arthritis of knee  Status post total right knee replacement  Morbid obesity with BMI of 45.0-49.9, adult (CMS-HCC)  Chronic pain of right knee  Trochanteric bursitis of right hip  Ventral hernia with gangrene and obstruction  Anxiety and depression  Chronic pain syndrome  Colostomy present (CMS/HHS-HCC)  Essential hypertension  History  of colonic diverticulitis  Hypertensive urgency  Increased anion gap metabolic acidosis  Malnutrition of mild degree (HHS-HCC)  OSA (obstructive sleep apnea)  Osteoarthritis  Other complications of colostomy (CMS/HHS-HCC)  Parastomal hernia without obstruction or gangrene  Prediabetes  Reactive airway disease  (HHS-HCC)  Rotator cuff arthropathy of right shoulder  Status post colostomy (CMS/HHS-HCC)  Small bowel obstruction (CMS/HHS-HCC)  S/P exploratory laparotomy   Past Surgical History:  Procedure Laterality Date  JOINT REPLACEMENT 04/26/2009  knee  EXPLORATORY LAPAROTOMY 05/2024  KNEE ARTHROSCOPY    Allergies  Allergen Reactions  Buprenorphine Hcl Unknown  Difficulty breathing, n/v  Nucynta [Tapentadol] Anaphylaxis  Tapentadol Hcl Anaphylaxis  Butrans [Buprenorphine] Other (See Comments)  Difficulty breathing, n/v  Sumacal [Glucose Polymer] Unknown  poison   Current Outpatient Medications on File Prior to Visit  Medication Sig Dispense Refill  aspirin 81 MG EC tablet Take by mouth. (Patient not taking: Reported on 07/26/2024)  baicalin-catechin (LIMBREL) 500 mg capsule Take by mouth. (Patient not taking: Reported on 07/26/2024)  cetirizine  (ZYRTEC ) 10 MG tablet Take by mouth. (Patient not taking: Reported on 07/26/2024)  cyclobenzaprine  (FLEXERIL ) 10 MG tablet Take 10 mg by mouth 3 (three) times daily as needed for Muscle spasms  Daily Multivitamin tablet 1 tab by mouth daily (Patient not taking: Reported on 07/26/2024)  diclofenac  (VOLTAREN ) 1 % topical gel Apply topically (Patient not taking: Reported on 07/26/2024)  gabapentin  (NEURONTIN ) 300 MG capsule Take 300 mg by mouth 3 (three) times daily  levomefol-B6-meB12-algal oil 3 mg-35 mg-2 mg -90.314 mg Cap Take by mouth. (Patient not taking: Reported on 07/26/2024)  ondansetron  (ZOFRAN -ODT) 4 MG disintegrating tablet Take 4 mg by mouth every 8 (eight) hours as needed  oxyCODONE  (ROXICODONE ) 5 MG immediate release tablet Take 1 tablet (5 mg total) by mouth every 4 (four) hours as needed for Pain (Patient not taking: Reported on 07/26/2024) 2 tablet 0  oxyCODONE  (ROXICODONE ) 5 MG immediate release tablet Take 1 tablet (5 mg total) by mouth every 6 (six) hours as needed for Pain (Patient not taking: Reported on 10/01/2024) 20 tablet 0   oxyCODONE  (ROXICODONE ) 5 MG immediate release tablet Take 1 tablet (5 mg total) by mouth every 6 (six) hours as needed for Pain (Patient not taking: Reported on 10/01/2024) 20 tablet 0   No current facility-administered medications on file prior to visit.   Family History  Problem Relation Age of Onset  No Known Problems Mother  High blood pressure (Hypertension) Father  Coronary Artery Disease (Blocked arteries around heart) Father  Diabetes Father  Heart disease Father    Social History   Tobacco Use  Smoking Status Former  Smokeless Tobacco Never    Social History   Socioeconomic History  Marital status: Married  Tobacco Use  Smoking status: Former  Smokeless tobacco: Never  Advertising Account Planner  Vaping status: Never Used  Substance and Sexual Activity  Alcohol use: No  Drug use: No   Social Drivers of Corporate Investment Banker Strain: Low Risk (05/31/2024)  Received from [facility]  Overall Financial Resource Strain (CARDIA)  Difficulty of Paying Living Expenses: Not hard at all  Food Insecurity: Food Insecurity Present (07/09/2024)  Received from [facility]  Hunger Vital Sign  Within the past 12 months, you worried that your food would run out before you got the money to buy more.: Sometimes true  Within the past 12 months, the food you bought just didn't last and you didn't have money to get more.: Sometimes true  Transportation Needs: No Transportation Needs (07/09/2024)  Received from [facility] - Transportation  In the past 12 months, has lack of transportation kept you from medical appointments or from getting medications?: No  In the past 12 months, has lack of transportation kept you from meetings, work, or from getting things needed for daily living?: No  Physical Activity: Insufficiently Active (05/31/2024)  Received from [facility]  Exercise Vital Sign  On average, how many days per week do you engage in moderate to strenuous exercise (like a brisk  walk)?: 7 days  On average, how many minutes do you engage in exercise at this level?: 20 min  Stress: Stress Concern Present (05/31/2024)  Received from [facility] - Occupational Stress Questionnaire  Feeling of Stress : To some extent  Social Connections: Moderately Isolated (05/31/2024)  Received from [facility]  Social Connection and Isolation Panel  In a typical week, how many times do you talk on the phone with family, friends, or neighbors?: Three times a week  How often do you attend church or religious services?: Never  Do you belong to any clubs or organizations such as church groups, unions, fraternal or athletic groups, or school groups?: No  How often do you attend meetings of the clubs or organizations you belong to?: Never  Are you married, widowed, divorced, separated, never married, or living with a partner?: Married   ############################################################  Review of Systems: A complete review of systems (ROS) was obtained from the patient.  We have reviewed this information and discussed as appropriate with the patient.  See HPI as well for other pertinent ROS.  Constitutional: No fevers, chills, sweats. Weight stable Eyes: No vision changes, No discharge HENT: No sore throats, nasal drainage Lymph: No neck swelling, No bruising easily Pulmonary: No cough, productive sputum CV: No orthopnea, PND . No exertional chest/neck/shoulder/arm pain. Patient can walk 1 mile gradually.   GI: No personal nor family history of GI/colon cancer, inflammatory bowel disease, irritable bowel syndrome, allergy such as Celiac Sprue, dietary/dairy problems, colitis, ulcers nor gastritis. No recent sick contacts/gastroenteritis. No travel outside the country. No changes in diet.  Renal: No UTIs, No hematuria Genital: No drainage, bleeding, masses Musculoskeletal: No severe joint pain. Good ROM major joints Skin: No sores or  lesions Heme/Lymph: No easy bleeding. No swollen lymph nodes Neuro: No active seizures. No facial droop Psych: No hallucinations. No agitation  OBJECTIVE   Vitals:  10/01/24 0933  PainSc: 5   There is no height or weight on file to calculate BMI.  PHYSICAL EXAM:  Constitutional: Not cachectic. Hygeine adequate. Vitals signs as above. Pear-shaped body habitus. Eyes: No glasses. Vision adequate,Pupils reactive, normal extraocular movements. Sclera nonicteric Neuro: CN II-XII intact. No major focal sensory defects. No major motor deficits. Lymph: No head/neck/groin lymphadenopathy Psych: No severe agitation. No severe anxiety. Judgment & insight Adequate, Oriented x4, HENT: Normocephalic, Mucus membranes moist. No thrush. Hearing: adequate Neck: Supple, No tracheal deviation. No obvious thyromegaly Chest: No pain to chest wall compression. Good respiratory excursion. No audible wheezing CV: Pulses intact. regular. No major extremity edema Ext: No obvious deformity or contracture. Edema: Not present. No cyanosis Skin: No major subcutaneous nodules. Warm and dry Musculoskeletal: Severe joint rigidity not present. No obvious clubbing. No digital petechiae. Mobility: no assist device moving easily without restrictions  Abdomen: Obese Soft. Nondistended. Mildly tender at incisions only. Bulging in his left lower quadrant colostomy site which correlates with a  small knuckle of bowel within it. Mid inside incision is mostly closed with some eschar on it. No current hernia there. Diastasis recti: Mild supraumbilical midline. No hepatomegaly. No splenomegaly.  Genital/Pelvic: Inguinal hernia: Not present. Inguinal lymph nodes: without lymphadenopathy nor hidradenitis.   Rectal:  Perianal skin Clean with good hygiene  Pruritis ani: Not present Pilonidal disease: Not present  External hemorrhoids: Not present Condyloma / warts: Not present Anal fissure: Not present Perirectal  abscess/fistula: Not present Sphincter tone: Normal without anal spasming  Digital and anoscopic rectal exam: Tolerated  Hemorrhoidal piles: Grade 1 normal Prostate: Enlarged but smooth Rectovaginal septum: N/A Rectal masses: Not present  Other significant findings: Soft waxy stool palpated. I do not feel the upper limit of the rectal stump to digital exam which reaches 7-9 cm.  PE Chaperone note: Russell Christine, CMA, was included in the room as chaperone for sensitive portions of the exam   ###################################  PE Chaperone note: Russell Christine, CMA, was included in the room as chaperone for sensitive portions of the exam   ###################################################################  Labs, Imaging and Diagnostic Testing:  Located in 'Care Everywhere' section of Epic EMR chart  PRIOR [facility] CLINIC NOTES:  Located in 'Care Everywhere' section of Epic EMR chart  SURGERY NOTES:  Located in 'Care Everywhere' section of Epic EMR chart  PATHOLOGY:  Located in 'Care Everywhere' section of Epic EMR chart  Assessment and Plan:  DIAGNOSES:  Diagnoses and all orders for this visit:  Parastomal hernia without obstruction or gangrene  Colostomy present (CMS/HHS-HCC)  Morbid obesity with BMI of 45.0-49.9, adult (CMS-HCC)  History of colonic diverticulitis  S/P exploratory laparotomy  Other orders - neomycin  500 mg tablet; Take 2 tablets (1,000 mg total) by mouth 3 (three) times daily for 3 doses SEE BOWEL PREP INSTRUCTIONS: Take 2 tablets at 2pm, 3pm, and 10pm the day prior to your colon operation. - metroNIDAZOLE  (FLAGYL ) 500 MG tablet; Take 2 tablets (1,000 mg total) by mouth 3 (three) times daily for 3 doses SEE BOWEL PREP INSTRUCTIONS: Take 2 tablets at 2pm, 3pm, and 10pm the day prior to your colon operation. - polyethylene glycol (MIRALAX ) powder; Take 233.75 g by mouth once for 1 dose Take according to your procedure prep instructions. -  bisacodyL  (DULCOLAX) 5 mg EC tablet; Take 4 tablets (20 mg total) by mouth once daily as needed for Constipation for up to 1 dose - ondansetron  (ZOFRAN ) 4 MG tablet; Take 1 tablet (4 mg total) by mouth every 8 (eight) hours as needed for Nausea    ASSESSMENT/PLAN  Obese active male with colostomy and evidence of early small recurrence of parastomal hernia. Prior sigmoid colectomy and takedown of colovesical fistula complicated by early leak and Hartman and colostomy in 2021. Chronic parastomal hernia with small bowel incarcerated documented in 2024 with partial obstruction. Then became strangulated requiring emergency exploratory vomiting with small bowel resection and suture repair of parastomal hernia by Dr. Curvin with our group 07/26/2024  The most definitive solution is to try and get rid of the ostomy. This is a second time he has a parastomal hernia and 1 required emergency surgery would only suture repair to fix and he was already starting to get a recurrence. He is several years out from his colovesical fistula takedown and colectomy complicated by Hartman's in 2021. He has lost weight from the mid 340s to the 280s. He had a colonoscopy in 2021 that showed only mild diverticulosis and no other issues so I do not think  we need to repeat it. He has decent performance status and no cardiopulmonary issues. He does not smoke. Reasonable for robotic minimally invasive approach.  The anatomy & physiology of the digestive tract was discussed. The pathophysiology was discussed. Possibility of remaining with an ostomy permanently was discussed. I offered ostomy takedown. Laparoscopic & open techniques were discussed.   Risks such as bleeding, infection, abscess, leak, reoperation, possible re-ostomy, injury to other organs, need for repair of tissues / organs, need for further treatment, hernia, heart attack, death, and other risks were discussed. I noted a good likelihood this will help address the problem.  Goals of post-operative recovery were discussed as well. We will work to minimize complications. Questions were answered. The patient expresses understanding & wishes to proceed with surgery.   His risk of the leak is above average because he is morbidly obese and male. However he is lost about 70 pounds intentionally and is motivated to get better. There is been time for both the ostomy and the rectal stump to have good blood supply so risk of leak seems less with ostomy takedowns. He does not smoke. He is not immunosuppressed nor diabetic. Been exercising. Encouraged him to be more active as possible and focus on nutrition and exercise. He is motivated to get better.   Elspeth KYM Schultze, MD, FACS, MASCRS Esophageal, Gastrointestinal & Colorectal Surgery Robotic and Minimally Invasive Surgery  [facility] [address], [address] [place], [place] [address] [phone] Fax [phone] Main  CONTACT INFORMATION: Weekday (9AM-5PM): Call [facility] main office at [phone] Weeknight (5PM-9AM) or Weekend/Holiday: Check EPIC Web Links tab & use AMION (password  [email or web address]) for General Surgery [facility] coverage  Please, DO NOT use SecureChat  (it is not reliable communication to reach operating surgeons & will lead to a delay in care).   Epic staff messaging available for outpatient concerns needing 1-2 business day response.     01/25/2025

## 2025-01-25 NOTE — Progress Notes (Signed)
 Patient extubated and stable in recovery room.  I discussed operative findings, updated the patient's status, discussed probable steps to recovery, and gave postoperative recommendations to the patient's brother, Zachary.  Recommendations were made.  Questions were answered.  He expressed understanding & appreciation.  Elspeth KYM Schultze, MD, FACS, MASCRS Esophageal, Gastrointestinal & Colorectal Surgery Robotic and Minimally Invasive Surgery  [facility] [address], [address] [place], [place] [address] [phone] Fax [phone] Main  CONTACT INFORMATION: Weekday (9AM-5PM): Call [facility] main office at [phone] Weeknight (5PM-9AM) or Weekend/Holiday: Check EPIC Web Links tab & use AMION (password  [email or web address]) for General Surgery [facility] coverage  Please, DO NOT use SecureChat  (it is not reliable communication to reach operating surgeons & will lead to a delay in care).   Epic staff messaging available for outpatient concerns needing 1-2 business day response.

## 2025-01-25 NOTE — Plan of Care (Signed)
" °  Problem: Education: Goal: Understanding of discharge needs will improve Outcome: Progressing Goal: Verbalization of understanding of the causes of altered bowel function will improve Outcome: Progressing   Problem: Activity: Goal: Ability to tolerate increased activity will improve Outcome: Progressing   Problem: Bowel/Gastric: Goal: Gastrointestinal status for postoperative course will improve Outcome: Progressing   Problem: Health Behavior/Discharge Planning: Goal: Identification of community resources to assist with postoperative recovery needs will improve Outcome: Progressing   Problem: Nutritional: Goal: Will attain and maintain optimal nutritional status will improve Outcome: Progressing   Problem: Clinical Measurements: Goal: Postoperative complications will be avoided or minimized Outcome: Progressing   Problem: Respiratory: Goal: Respiratory status will improve Outcome: Progressing   Problem: Skin Integrity: Goal: Will show signs of wound healing Outcome: Progressing   Problem: Education: Goal: Knowledge of the prescribed therapeutic regimen will improve Outcome: Progressing   Problem: Bowel/Gastric: Goal: Gastrointestinal status for postoperative course will improve Outcome: Progressing   Problem: Cardiac: Goal: Ability to maintain an adequate cardiac output Outcome: Progressing Goal: Will show no evidence of cardiac arrhythmias Outcome: Progressing   Problem: Nutritional: Goal: Will attain and maintain optimal nutritional status Outcome: Progressing   Problem: Neurological: Goal: Will regain or maintain usual level of consciousness Outcome: Progressing   Problem: Clinical Measurements: Goal: Ability to maintain clinical measurements within normal limits Outcome: Progressing Goal: Postoperative complications will be avoided or minimized Outcome: Progressing   Problem: Respiratory: Goal: Will regain and/or maintain adequate  ventilation Outcome: Progressing Goal: Respiratory status will improve Outcome: Progressing   Problem: Skin Integrity: Goal: Demonstrates signs of wound healing without infection Outcome: Progressing   Problem: Urinary Elimination: Goal: Will remain free from infection Outcome: Progressing Goal: Ability to achieve and maintain adequate urine output Outcome: Progressing   Problem: Education: Goal: Knowledge of General Education information will improve Description: Including pain rating scale, medication(s)/side effects and non-pharmacologic comfort measures Outcome: Progressing   Problem: Health Behavior/Discharge Planning: Goal: Ability to manage health-related needs will improve Outcome: Progressing   Problem: Clinical Measurements: Goal: Ability to maintain clinical measurements within normal limits will improve Outcome: Progressing Goal: Will remain free from infection Outcome: Progressing Goal: Diagnostic test results will improve Outcome: Progressing Goal: Respiratory complications will improve Outcome: Progressing Goal: Cardiovascular complication will be avoided Outcome: Progressing   Problem: Activity: Goal: Risk for activity intolerance will decrease Outcome: Progressing   Problem: Nutrition: Goal: Adequate nutrition will be maintained Outcome: Progressing   Problem: Coping: Goal: Level of anxiety will decrease Outcome: Progressing   Problem: Elimination: Goal: Will not experience complications related to bowel motility Outcome: Progressing Goal: Will not experience complications related to urinary retention Outcome: Progressing   Problem: Pain Managment: Goal: General experience of comfort will improve and/or be controlled Outcome: Progressing   Problem: Safety: Goal: Ability to remain free from injury will improve Outcome: Progressing   Problem: Skin Integrity: Goal: Risk for impaired skin integrity will decrease Outcome: Progressing   "

## 2025-01-25 NOTE — Discharge Instructions (Signed)
 SURGERY: POST OP INSTRUCTIONS (Surgery for small bowel obstruction, colon resection, etc)   ######################################################################  EAT Gradually transition to a high fiber diet with a fiber supplement over the next few days after discharge  WALK Walk an hour a day.  Control your pain to do that.    CONTROL PAIN Control pain so that you can walk, sleep, tolerate sneezing/coughing, go up/down stairs.  HAVE A BOWEL MOVEMENT DAILY Keep your bowels regular to avoid problems.  OK to try a laxative to override constipation.  OK to use an antidairrheal to slow down diarrhea.  Call if not better after 2 tries  CALL IF YOU HAVE PROBLEMS/CONCERNS Call if you are still struggling despite following these instructions. Call if you have concerns not answered by these instructions  ######################################################################   DIET Follow a light diet the first few days at home.  Start with a bland diet such as soups, liquids, starchy foods, low fat foods, etc.  If you feel full, bloated, or constipated, stay on a ful liquid or pureed/blenderized diet for a few days until you feel better and no longer constipated. Be sure to drink plenty of fluids every day to avoid getting dehydrated (feeling dizzy, not urinating, etc.). Gradually add a fiber supplement to your diet over the next week.  Gradually get back to a regular solid diet.  Avoid fast food or heavy meals the first week as you are more likely to get nauseated. It is expected for your digestive tract to need a few months to get back to normal.  It is common for your bowel movements and stools to be irregular.  You will have occasional bloating and cramping that should eventually fade away.  Until you are eating solid food normally, off all pain medications, and back to regular activities; your bowels will not be normal. Focus on eating a low-fat, high fiber diet the rest of your life  (See Getting to Good Bowel Health, below).  CARE of your INCISION or WOUND  It is good for closed incisions and even open wounds to be washed every day.  Shower every day.  Short baths are fine.  Wash the incisions and wounds clean with soap & water .    You may leave closed incisions open to air if it is dry.   You may cover the incision with clean gauze & replace it after your daily shower for comfort.  TEGADERM:  You have clear gauze band-aid dressings over your closed incision(s).  Remove the dressings 2 days after surgery = 2/1.    If you have an open wound with a wound vac, see wound vac care instructions.    ACTIVITIES as tolerated Start light daily activities --- self-care, walking, climbing stairs-- beginning the day after surgery.  Gradually increase activities as tolerated.  Control your pain to be active.  Stop when you are tired.  Ideally, walk several times a day, eventually an hour a day.   Most people are back to most day-to-day activities in a few weeks.  It takes 4-8 weeks to get back to unrestricted, intense activity. If you can walk 30 minutes without difficulty, it is safe to try more intense activity such as jogging, treadmill, bicycling, low-impact aerobics, swimming, etc. Save the most intensive and strenuous activity for last (Usually 4-8 weeks after surgery) such as sit-ups, heavy lifting, contact sports, etc.  Refrain from any intense heavy lifting or straining until you are off narcotics for pain control.  You will have off  days, but things should improve week-by-week. DO NOT PUSH THROUGH PAIN.  Let pain be your guide: If it hurts to do something, don't do it.  Pain is your body warning you to avoid that activity for another week until the pain goes down. You may drive when you are no longer taking narcotic prescription pain medication, you can comfortably wear a seatbelt, and you can safely make sudden turns/stops to protect yourself without hesitating due to  pain. You may have sexual intercourse when it is comfortable. If it hurts to do something, stop.   MEDICATIONS Take your usually prescribed home medications unless otherwise directed.    Blood thinners:  You can restart any strong blood thinners after the second postoperative day  for example: COUMADIN (warfarin), XERELTO (rivaroxaban), ELIQUIS (apixaban), PLAVIX (clopidigrel), BRILINTA (ticagrelor), EFFIENT (prasugrel), PRADAXA (dabigatran), etc  Continue aspirin before & after surgery..     Some oozing/bleeding the first 1-2 weeks is common but should taper down & be small volume.    If you are passing many large clots or having uncontrolling bleeding, call your surgeon    PAIN CONTROL Pain after surgery or related to activity is often due to strain/injury to muscle, tendon, nerves and/or incisions.  This pain is usually short-term and will improve in a few months.  To help speed the process of healing and to get back to regular activity more quickly, DO THE FOLLOWING THINGS TOGETHER: Increase activity gradually.  DO NOT PUSH THROUGH PAIN Use Ice and/or Heat Try Gentle Massage and/or Stretching Take over the counter pain medication Take Narcotic prescription pain medication for more severe pain  Good pain control = faster recovery.  It is better to take more medicine to be more active than to stay in bed all day to avoid medications.  Increase activity gradually Avoid heavy lifting at first, then increase to lifting as tolerated over the next 6 weeks. Do not push through the pain.  Listen to your body and avoid positions and maneuvers than reproduce the pain.  Wait a few days before trying something more intense Walking an hour a day is encouraged to help your body recover faster and more safely.  Start slowly and stop when getting sore.  If you can walk 30 minutes without stopping or pain, you can try more intense activity (running, jogging, aerobics, cycling, swimming,  treadmill, sex, sports, weightlifting, etc.) Remember: If it hurts to do it, then dont do it! Use Ice and/or Heat You will have swelling and bruising around the incisions.  This will take several weeks to resolve. Ice packs or heating pads (6-8 times a day, 30-60 minutes at a time) will help sooth soreness & bruising. Some people prefer to use ice alone, heat alone, or alternate between ice & heat.  Experiment and see what works best for you.  Consider trying ice for the first few days to help decrease swelling and bruising; then, switch to heat to help relax sore spots and speed recovery. Shower every day.  Short baths are fine.  It feels good!  Keep the incisions and wounds clean with soap & water .   Try Gentle Massage and/or Stretching Massage at the area of pain many times a day Stop if you feel pain - do not overdo it Take over the counter pain medication This helps the muscle and nerve tissues become less irritable and calm down faster Choose ONE of the following over-the-counter anti-inflammatory medications: Acetaminophen  500mg  tabs (Tylenol ) 1-2 pills with every meal  and just before bedtime (avoid if you have liver problems or if you have acetaminophen  in you narcotic prescription) Naproxen 220mg  tabs (ex. Aleve, Naprosyn) 1-2 pills twice a day (avoid if you have kidney, stomach, IBD, or bleeding problems) Ibuprofen  200mg  tabs (ex. Advil , Motrin ) 3-4 pills with every meal and just before bedtime (avoid if you have kidney, stomach, IBD, or bleeding problems) Take with food/snack several times a day as directed for at least 2 weeks to help keep pain / soreness down & more manageable. Take Narcotic prescription pain medication for more severe pain A prescription for strong pain control is often given to you upon discharge (for example: oxycodone /Percocet, hydrocodone /Norco/Vicodin, or tramadol /Ultram ) Take your pain medication as prescribed. Be mindful that most narcotic prescriptions  contain Tylenol  (acetaminophen ) as well - avoid taking too much Tylenol . If you are having problems/concerns with the prescription medicine (does not control pain, nausea, vomiting, rash, itching, etc.), please call us  [phone] to see if we need to switch you to a different pain medicine that will work better for you and/or control your side effects better. If you need a refill on your pain medication, you must call the office before 4 pm and on weekdays only.  By federal law, prescriptions for narcotics cannot be called into a pharmacy.  They must be filled out on paper & picked up from our office by the patient or authorized caretaker.  Prescriptions cannot be filled after 4 pm nor on weekends.    WHEN TO CALL US  [phone] Severe uncontrolled or worsening pain  Fever over 101 F (38.5 C) Concerns with the incision: Worsening pain, redness, rash/hives, swelling, bleeding, or drainage Reactions / problems with new medications (itching, rash, hives, nausea, etc.) Nausea and/or vomiting Difficulty urinating Difficulty breathing Worsening fatigue, dizziness, lightheadedness, blurred vision Other concerns If you are not getting better after two weeks or are noticing you are getting worse, contact our office [phone] for further advice.  We may need to adjust your medications, re-evaluate you in the office, send you to the emergency room, or see what other things we can do to help. The clinic staff is available to answer your questions during regular business hours (8:30am-5pm).  Please dont hesitate to call and ask to speak to one of our nurses for clinical concerns.    A surgeon from [facility] is always on call at the hospitals 24 hours/day If you have a medical emergency, go to the nearest emergency room or call 911.  FOLLOW UP in our office One the day of your discharge from the hospital (or the next business weekday), please call [facility] to set up  or confirm an appointment to see your surgeon in the office for a follow-up appointment.  Usually it is 2-3 weeks after your surgery.   If you have skin staples at your incision(s), let the office know so we can set up a time in the office for the nurse to remove them (usually around 10 days after surgery). Make sure that you call for appointments the day of discharge (or the next business weekday) from the hospital to ensure a convenient appointment time. IF YOU HAVE DISABILITY OR FAMILY LEAVE FORMS, BRING THEM TO THE OFFICE FOR PROCESSING.  DO NOT GIVE THEM TO YOUR DOCTOR.  [facility] [address], [place], [place]  [address] ? [phone] - Main [phone] - Toll Free,  [phone] - Fax [email or web address]  GETTING TO GOOD BOWEL HEALTH. It is expected for your digestive tract to need a few months to get back to normal.  It is common for your bowel movements and stools to be irregular.  You will have occasional bloating and cramping that should eventually fade away.  Until you are eating solid food normally, off all pain medications, and back to regular activities; your bowels will not be normal.   Avoiding constipation The goal: ONE SOFT BOWEL MOVEMENT A DAY!    Drink plenty of fluids.  Choose water  first. TAKE A FIBER SUPPLEMENT EVERY DAY THE REST OF YOUR LIFE During your first week back home, gradually add back a fiber supplement every day Experiment which form you can tolerate.   There are many forms such as powders, tablets, wafers, gummies, etc Psyllium bran (Metamucil), methylcellulose (Citrucel), Miralax  or Glycolax , Benefiber, Flax Seed.  Adjust the dose week-by-week (1/2 dose/day to 6 doses a day) until you are moving your bowels 1-2 times a day.  Cut back the dose or try a different fiber product if it is giving you problems such as diarrhea or bloating. Sometimes a laxative is needed to help jump-start bowels if  constipated until the fiber supplement can help regulate your bowels.  If you are tolerating eating & you are farting, it is okay to try a gentle laxative such as double dose MiraLax , prune juice, or Milk of Magnesia.  Avoid using laxatives too often. Stool softeners can sometimes help counteract the constipating effects of narcotic pain medicines.  It can also cause diarrhea, so avoid using for too long. If you are still constipated despite taking fiber daily, eating solids, and a few doses of laxatives, call our office. Controlling diarrhea Try drinking liquids and eating bland foods for a few days to avoid stressing your intestines further. Avoid dairy products (especially milk & ice cream) for a short time.  The intestines often can lose the ability to digest lactose when stressed. Avoid foods that cause gassiness or bloating.  Typical foods include beans and other legumes, cabbage, broccoli, and dairy foods.  Avoid greasy, spicy, fast foods.  Every person has some sensitivity to other foods, so listen to your body and avoid those foods that trigger problems for you. Probiotics (such as active yogurt, Align, etc) may help repopulate the intestines and colon with normal bacteria and calm down a sensitive digestive tract Adding a fiber supplement gradually can help thicken stools by absorbing excess fluid and retrain the intestines to act more normally.  Slowly increase the dose over a few weeks.  Too much fiber too soon can backfire and cause cramping & bloating. It is okay to try and slow down diarrhea with a few doses of antidiarrheal medicines.   Bismuth  subsalicylate (ex. Kayopectate, Pepto Bismol) for a few doses can help control diarrhea.  Avoid if pregnant.   Loperamide (Imodium) can slow down diarrhea.  Start with one tablet (2mg ) first.  Avoid if you are having fevers or severe pain.  ILEOSTOMY PATIENTS WILL HAVE CHRONIC DIARRHEA since their colon is not in use.    Drink plenty of liquids.   You will need to drink even more glasses of water /liquid a day to avoid getting dehydrated. Record output from your ileostomy.  Expect to empty the bag every 3-4 hours at first.  Most people with a permanent ileostomy empty their bag 4-6 times at the least.   Use antidiarrheal medicine (especially Imodium) several times a day to avoid getting dehydrated.  Start with a dose at bedtime & breakfast.  Adjust up or down as needed.  Increase antidiarrheal medications as directed to avoid emptying the bag more than 8 times a day (every 3 hours). Work with your wound ostomy nurse to learn care for your ostomy.  See ostomy care instructions. TROUBLESHOOTING IRREGULAR BOWELS 1) Start with a soft & bland diet. No spicy, greasy, or fried foods.  2) Avoid gluten/wheat or dairy products from diet to see if symptoms improve. 3) Miralax  17gm or flax seed mixed in 8oz. water  or juice-daily. May use 2-4 times a day as needed. 4) Gas-X, Phazyme, etc. as needed for gas & bloating.  5) Prilosec (omeprazole) over-the-counter as needed 6)  Consider probiotics (Align, Activa, etc) to help calm the bowels down  Call your doctor if you are getting worse or not getting better.  Sometimes further testing (cultures, endoscopy, X-ray studies, CT scans, bloodwork, etc.) may be needed to help diagnose and treat the cause of the diarrhea. [facility] [address], [place], [place]  [address] [phone] - Main.    [phone]  - Toll Free.   [phone] - Fax [email or web address]   FOOD PANTRY [employer] [address] [phone]  [facility] [address] [place] [address] [phone]  [facility] [employer] [address] [place] [phone]  [facility] [address] [phone]  [facility] [employer] [facility] [address] [place], [place] [address] [phone]

## 2025-01-25 NOTE — Anesthesia Procedure Notes (Signed)
 Procedure Name: Intubation Date/Time: 01/25/2025 7:42 AM  Performed by: Nada Corean CROME, CRNAPre-anesthesia Checklist: Suction available, Emergency Drugs available, Patient identified, Patient being monitored and Timeout performed Patient Re-evaluated:Patient Re-evaluated prior to induction Oxygen Delivery Method: Circle system utilized Preoxygenation: Pre-oxygenation with 100% oxygen Induction Type: IV induction and Cricoid Pressure applied Ventilation: Oral airway inserted - appropriate to patient size and Mask ventilation without difficulty Laryngoscope Size: Glidescope Grade View: Grade I Tube type: Oral Tube size: 7.5 mm Number of attempts: 2 Airway Equipment and Method: Stylet and Video-laryngoscopy Placement Confirmation: ETT inserted through vocal cords under direct vision, positive ETCO2, breath sounds checked- equal and bilateral and CO2 detector Secured at: 23 cm Tube secured with: Tape Dental Injury: Teeth and Oropharynx as per pre-operative assessment

## 2025-01-25 NOTE — Op Note (Signed)
 01/25/2025  1:36 PM  PATIENT:  Jordan [name]  44 y.o. male  Patient Care Team: [name], MD as PCP - General (Family Medicine) Sande Ozell Mt, MD as Referring Physician (Orthopedic Surgery) Jude Harden GAILS, MD as Consulting Physician (Pulmonary Disease) Gordy Channing LABOR, RN as [facility] Jinny Carmine, MD as Consulting Physician (Gastroenterology) Curvin Mt MOULD, MD as Consulting Physician (General Surgery) Sheldon Standing, MD as Consulting Physician (Colon and Rectal Surgery)  PRE-OPERATIVE DIAGNOSIS:   -COLOSTOMY OF COLON RESECTION. DESIRE FOR OSTOMY TAKEDOWN -RECURRENT PARASTOMAL HERNIA  POST-OPERATIVE DIAGNOSIS:   -COLOSTOMY OF COLON RESECTION. DESIRE FOR OSTOMY TAKEDOWN -RECURRENT PARASTOMAL HERNIA  PROCEDURE:   -ROBOTIC TAKEDOWN OF END COLOSTOMY WITH ANASTOMOSIS -RESECTION OF SMALL INTESTINE WITH ANASTOMOSIS,  -PRIMARY REPAIR OF RECURRENT INCISIONAL (PARASTOMAL) HERNIA -LYSIS OF ADHESIONS x120 MINUTES (70% OF CASE),  -INTRAOPERATIVE ASSESSMENT OF TISSUE VASCULAR PERFUSION USING ICG (indocyanine green ) IMMUNOFLUORESCENCE,  -TRANSVERSUS ABDOMINIS PLANE (TAP) BLOCK - BILATERAL -FLEXIBLE SIGMOIDOSCOPY  SURGEON:  Standing KYM Sheldon, MD  ASSISTANT:   Medford Pizza, MD  S. Ronnald Nick, PA-S, [facility]  An experienced assistant was required given the standard of surgical care given the complexity of the case.  This assistant was needed for exposure, dissection, suction, tissue approximation, retraction, perception, etc.  ANESTHESIA:  General endotracheal intubation anesthesia (GETA) and Regional TRANSVERSUS ABDOMINIS PLANE (TAP) nerve block -BILATERAL for perioperative & postoperative pain control at the level of the transverse abdominis & preperitoneal spaces along the flank at the anterior axillary line, from subcostal ridge to iliac crest under laparoscopic guidance provided with 60 mL of bupivicaine 0.25% with  epinephrine   Estimated Blood Loss (EBL):   Total I/O In: 1620.2 [I.V.:1520.2; IV Piggyback:100] Out: 1025 [Urine:925; Blood:100].   (See anesthesia record)  Delay start of Pharmacological VTE agent (>24hrs) due to concerns of significant anemia, surgical blood loss, or risk of bleeding?:  no  DRAINS: (None) and 19 Fr Blake drain the tip resting in the pelvis  SPECIMEN:  Small intestine , Colostomy, and Distal anastomotic ring (FINAL DISTAL MARGIN)  DISPOSITION OF SPECIMEN:  Pathology  COUNTS:  Sponge, needle, & instrument counts CORRECT at the conclusion of the case.      PLAN OF CARE: Admit to inpatient   PATIENT DISPOSITION:  PACU - hemodynamically stable.  INDICATION: Pleasant patient status post colectomy with end ostomy.  The patient has recovered from that surgery and has understandably requested ostomy takedown.  Medically stabilized and felt reasonable to proceed.   I discussed the procedure with the patient:  The anatomy & physiology of the digestive tract was discussed.  The pathophysiology was discussed.  Possibility of remaining with an ostomy permanently was discussed.  I offered ostomy takedown.  Minimally invasive & open techniques were discussed.   Risks such as bleeding, infection, abscess, leak, reoperation, possible re-ostomy, injury to other organs, hernia, heart attack, death, and other risks were discussed.   I noted a good likelihood this will help address the problem.  Goals of post-operative recovery were discussed as well.  We will work to minimize complications.  Questions were answered.  The patient expresses understanding & wishes to proceed with surgery.  OR FINDINGS:   Rather dense adhesions especially of some omentum and small bowel to the the anterior abdominal wall.  Loop of ileum going up into the fascial defect consistent with parastomal hernia as suspected on preoperative CT scan.  No necrosis perforation or ischemia.  Softer but large volume of  interloop  small bowel adhesions as well.  Small bowel resection done of area with 2 very dense fibrotic adhesions with prior silk suture consistent prior enterotomy repair.  Felt not salvageable with some thickening and stricturing.  Small bowel resection done.  No obvious metastatic disease on visceral parietal peritoneum or liver.  It is a 31mm EEA anastomosis ( distal descending colon  connected to mid rectum.)  It rests 13 cm from the anal verge by rigid proctoscopy.  Moderate diastases of the midline but no major incision there.  A few small Swiss cheese holes in there as well as in the left side containing old omentum only.  CASE DATA: Type of patient?: Elective [facility] Private Case Status of Case? Elective Scheduled Infection Present At Time Of Surgery (PATOS)?  NO   DESCRIPTION:   Informed consent was confirmed.  The patient underwent general anaesthesia without difficulty.  The patient was positioned appropriately.  VTE prevention in place.  Patient underwent cystoscopy with ICG firefly infiltration of bladder and both ureters by Dr. Nieves with [facility]..  Please see his separate note.  No major concerns.  The patient's abdomen was clipped, prepped, & draped in a sterile fashion.  Surgical timeout confirmed our plan.  Peritoneal entry with a laparoscopic port was obtained using Varess spring needle entry technique in the  upper abdomen as the patient was positioned in reverse Trendelenburg.  I induced carbon dioxide insufflation.  No change in end tidal CO2 measurements.  Full symmetrical abdominal distention.  Initial port was carefully placed.  Camera inspection revealed no injury.  Extra ports were carefully placed under direct laparoscopic visualization.  Xi robot carefully docked & instruments placed.  We then continued with minimally invasive exploration.  I worked to freed adhesions to the anterior abdominal wall parietal peritoneum.  This took some time given extremely dense  adhesions in the midline.  3 areas that were very thick and inflamed and fibrotic of concern.  No obvious enterotomy.  Freed omental adhesions off the colostomy and visceral peritoneum.  Make sure to free all small bowel interloop adhesions and adhesions to the left retroperitoneum and colostomy mesentery and pelvis.  Freed off numerous small bowel loops off the left colon mesentery.  Found loop of bowel going up into the colostomy consistent with a recurrent parastomal hernia.  Spent time freeing off interloop adhesions to help declaw*and better inspect the small bowel.  There were 3 thickened adhesion areas on the small bowel.  2 seem relatively mild but one looked much more beaten up and it was along was most densely stuck to the abdominal wall.  We inspected the small intestine to ensure there is no injury or other surprises.  Hemostasis was good.    We focused on dissection down in the pelvis.  Work to free adhesions and identify the rectal stump.  The right ureter was actually more in the midline so we took care to keep that in the retroperitoneal position and protected.  Had free the bladder which was somewhat floppy on the rectal stump.  Ended up going down to class I EEA sizers transanally to help identify the anatomy.  Freed off visceral peritoneum along the right and left anterior rectal pelvic reflection.  Work to come behind the rectal stump and the presacral plane to help elevate the mesorectum off the sacral promontory to be sure we adequately identified the rectosigmoid junction.  With enough mobility we felt we do not need to do any further resection of the  remaining rectal stump.  We did reinspection and saw no injury or other concerns.  Felt it was safe to proceed with colostomy takedown.  To assess vascular perfusion of tissues, we asked anesthesia use intravenous  indocyanine green  (ICG) with IV flush.  I switched to the NIR fluorescence (Firefly mode) imaging window on the daVinci robot  platform.  We were able to see good light green visualization of blood vessels with good vascular perfusion of tissues, confirming good tissue perfusion of tissues (colostomy and rectal stump) planned for anastomosis.  Small bowel had good perfusion as well.  We undocked the robot.  We made an incision around the ostomy.  I got into the subcutaneous tissues.  I used careful focused right angle dissection and sharp dissection.  Some focused cautery dissection as well.  That helped to free adhesions to the subcutaneous tisses & fascia.  I was able to enter into the peritoneum focally.  I did a gentle finger sweep.  Gradually came around circumferentially and freed the bowel from remaining adhesions to the abdominal wall.  We were able eviscerate the ostomy to do inspection and assured viability and hemostasis.  I chose as distal the location that was viable for the proximal end of the anastomosis.  I clamped the colon at the point of resection using a reusable pursestringer device.  Passed a 2-0 Keith needle. I transected at the descending/sigmoid junction with a scalpel. I got healthy bleeding mucosa.  We sent the rectosigmoid colon specimen off to go to pathology.  We sized the colon orifice.   I chose a EEA anvil stapler system.  I reinforced the prolene pursestring with interrupted silk belt loop sutures.  I placed the anvil to the open end of the proximal remaining colon and closed around it using the pursestring.   Returned the viscera back into the abdomen and did inspection of the abdomen.  We did copious irrigation with crystalloid solution.  Hemostasis was good.  The distal end of the colon at the handle easily reached down to the rectal stump, therefore, splenic flexure mobilization was not needed.   Exteriorized the small bowel and ran it.  Confirmed a 3 major areas of density prior adhesions.  Most distal area look clean.  There was a thickened area that was very fibrotic narrowed and strictured.   Numerous pockets of serosal breaching with old silk sutures.  10 cm more distally was another area with some dense thickening adhesions.  I was guarded on trying to do another serosal pair somewhat already been done and the bowel looked thickened.  Decide to do a small bowel resection.  Did a side-to-side stapled anastomosis with a 75 GIA stapler and transected and stapled off the common defect with a TX 90 stapler.  Took the intervening mesentery with LigaSure vessel sealer.  Did silk sutures to imbricate the corners of the TX 90 staple line as well as an antitension stitch at the more proximal end of the anastomosis for the classic crotch stitch.  I then closed the mesentery transversely over the TX needle staple line as a mesenteropexy. to help cover and protect that.  AnastomotiS look well.  Ran the small bowel and saw no other concerns.  Reassuring.     Dr Teresa scrubbed down and did gentle anal dilation and advanced the EEA stapler up the rectal stump. The spike was brought out at the provimal end of the rectal stump under direct visualization.  I attached the anvil of the  proximal colon the spike of the stapler. Anvil was tightened down and held clamped for 60 seconds.  Orientation was confirmed such that there is no twisting of the colon nor small bowel underneath the mesenteric defect. No concerning tension.  The EEA stapler was fired and held clamped for 30 seconds. The stapler was released & removed. Blue stitch is in the proximal ring.  Care was taken to ensure no other structures were incorporated within this either.  We noted 2 excellent anastomotic rings.   The colon proximal to the anastomosis was then gently occluded. The pelvis was filled with sterile irrigation.  Dr Teresa did flexible sigmoidoscopy.  Noted the anastomosis was at 13 cm from the anal verge consistent with the proximal rectum.  Intact anastomosis without active bleeding.  No mucosal ischemia.  There was a negative air leak  test. There was no tension of mesentery or bowel at the anastomosis.   Tissues looked viable.  Ureters & bowel uninjured.  The anastomosis looked healthy.  Greater omentum had been rather obliterated and foreshortened and would not come down below the costal ridge.  Given the extensive lyse adhesions friability and oozing is decided to leave a drain down the pelvis.  Secured at the skin with 2-0 Prolene.  I removed CO2 gas out through the ports.  Ports and will protector and instruments removed.  We changed gown and gloves.  The patient was re-draped.  Sterile unused instruments were used from this point out per colon SSI prevention protocol.  I closed the 5mm port sites using Monocryl stitch and sterile dressing.  Excised some old hernia sac and freed hernia sac and subcutaneous fat off of the fascia.  Permanent close the fascial defect at the parastomal hernia using running #1 PDS.   Excise skin transversely.  Closed deep layer with some interrupted Vicryl sutures sent Scarpa's and interrupted deep dermal sutures.  Did a Monocryl suture in the central third leaving the corners open.  Placed Franchot in show to form Nu Gauze ribbon gauze into the corners of the colostomy from packing.  We placed  sterile dressing.    Patient is being extubated go to recovery room. I discussed postop care with the patient in detail the office & in the holding area.  I had previously discussed with his spouse, Zhyon Antenucci, in the holding area as well.  Instructions are written. I made an attempt to locate & reach the desired patient contact to discuss the patient's overall status and my recommendations.  No one is available at this time.  My plans & instructions have been written in the chart.    Elspeth KYM Schultze, M.D., F.A.C.S. Gastrointestinal and Minimally Invasive Surgery [facility] [address] [place], [place] [address] [phone] Main /  Paging  01/25/2025

## 2025-01-25 NOTE — Transfer of Care (Signed)
 Immediate Anesthesia Transfer of Care Note  Patient: Jordan [name]  Procedure(s) Performed: CLOSURE, COLOSTOMY, ROBOT-ASSISTED REPAIR, HERNIA, PARASTOMAL LYSIS, ADHESIONS, LAPAROSCOPIC SIGMOIDOSCOPY, FLEXIBLE CYSTOSCOPY WITH INDOCYANINE GREEN  IMAGING (ICG)  Patient Location: PACU  Anesthesia Type:General  Level of Consciousness: awake, alert , oriented, and patient cooperative  Airway & Oxygen Therapy: Patient Spontanous Breathing and Patient connected to face mask oxygen  Post-op Assessment: Report given to RN and Post -op Vital signs reviewed and stable  Post vital signs: Reviewed and stable  Last Vitals:  Vitals Value Taken Time  BP 166/92 01/25/25 13:33  Temp    Pulse 82 01/25/25 13:36  Resp 24 01/25/25 13:36  SpO2 100 % 01/25/25 13:36  Vitals shown include unfiled device data.  Last Pain:  Vitals:   01/25/25 9367  TempSrc:   PainSc: 0-No pain         Complications: No notable events documented.

## 2025-01-25 NOTE — Op Note (Signed)
 Preoperative diagnosis: Colostomy reversal, parastomal hernia Postoperative diagnosis: Same  Procedure: Cystoscopy with retrograde injection of firefly fluorescent dye  Surgeon: Nieves  Anesthesia: General  Indication for procedure: Jordan [name] a 44 year old male with a history of colectomy and colovesical fistula takedown with postop leak and Hartmann colostomy.  He presents today for colostomy takedown and parastomal hernia repair with Dr. Sheldon.  Findings: On exam the penis was circumcised without mass or lesion.  Foreskin appeared normal.  Glans and meatus were normal.  Scrotum was normal.  Testicles descended bilaterally and palpably normal.  No scrotal swelling.  On DRE prostate was 20 g and smooth without hard area or nodule.  Cystoscopy revealed a normal urethra, prostate short and nonobstructive.  The bladder contained no stone or foreign body.  There was no sign of colovesical fistula.  The bladder mucosa was normal without lesion.  The trigone and ureteral orifices were in the normal orthotopic position with clear efflux.   Description of procedure: After consent was obtained patient brought to the operating room.  After adequate anesthesia he was placed in lithotomy position and prepped and draped in the usual sterile fashion.  Timeout was performed to confirm the patient and procedure.  The cystoscope was passed per urethra and the bladder inspected.  The left ureteral orifice was then cannulated with a 6 French open-ended catheter and left retrograde injection of 8 cc firefly fluorescent dye was performed.  Injection went without any back pressure and I allowed it to dwell for a minute.  The catheter was removed and the right ureteral orifice was cannulated and 8 cc of firefly dye injected retrograde without any backed pressure.  I allowed it to dwell for about a minute.  The scope was removed and a 16 French Foley catheter was placed and left to gravity drainage.  The patient was turned  over to the care of Dr. Sheldon.  Complications: None  Blood loss: Minimal  Specimens: None  Drains: 16 French Foley catheter  Disposition: Patient turned over to the care of Dr. Sheldon for his procedure.

## 2025-01-25 NOTE — Interval H&P Note (Signed)
 History and Physical Interval Note:  01/25/2025 7:36 AM  Jordan [name]  has presented today for surgery, with the diagnosis of COLOSTOMY OF COLON RESECTION. DESIRE FOR OSTOMY TAKEDOWN.  The various methods of treatment have been discussed with the patient and family. After consideration of risks, benefits and other options for treatment, the patient has consented to  Procedures: CLOSURE, COLOSTOMY, ROBOT-ASSISTED (N/A) REPAIR, HERNIA, PARASTOMAL (N/A) CREATION, ILEOSTOMY, DIVERTING, ROBOT-ASSISTED (N/A) LYSIS, ADHESIONS, LAPAROSCOPIC (N/A) SIGMOIDOSCOPY, FLEXIBLE (N/A) CYSTOSCOPY WITH INDOCYANINE GREEN  IMAGING (ICG) (N/A) as a surgical intervention.  The patient's history has been reviewed, patient examined, no change in status, stable for surgery.  I have reviewed the patient's chart and labs.  Questions were answered to the patient's satisfaction.     Elspeth JAYSON Schultze

## 2025-01-25 NOTE — Progress Notes (Addendum)
 MD paged about patient coming to the unit with saturated dressings and JP output greater than 350. JP continues to have high output and has emptied out another 100. BP elevated 170s/100s given BP meds in PACU. HR less than 60. Gave hydralazine  first due to HR.

## 2025-01-25 NOTE — Plan of Care (Signed)
" °  Problem: Nutritional: Goal: Will attain and maintain optimal nutritional status Outcome: Progressing   Problem: Activity: Goal: Risk for activity intolerance will decrease Outcome: Progressing   Problem: Nutrition: Goal: Adequate nutrition will be maintained Outcome: Progressing   "

## 2025-01-25 NOTE — Consult Note (Addendum)
 Consultation: Colostomy reversal, parastomal hernia Requested by: Dr. Elspeth Schultze  History of Present Illness: Velinda is a 44 year old male with a history of diverticulitis and colovesical fistula.  He underwent robotic takedown of the fistula and colectomy and anastomosis in 2021 but developed a leak.  He then underwent a  Hartmann colostomy.  He presents today with Dr. Schultze for colostomy takedown and hernia repair.  He denies any gross hematuria or pneumaturia.  He does have foul-smelling urine on occasion but passes no stool or particulate in the urine.  He endorses a good stream and no incontinence.  He describes occasional scrotal swelling.  He underwent October 2025 CT-benign GU.  No stone, hydronephrosis or evidence of colovesical fistula.  Bladder appeared normal.  Prostate normal.  Past Medical History:  Diagnosis Date   Anxiety and depression    Asthma    reactive airway disease   Chronic kidney disease    Chronic pain syndrome    GERD (gastroesophageal reflux disease)    History of kidney stones    HYPERTENSION, MILD    OSTEOARTHRITIS    Pre-diabetes    Recurrent UTI    Sleep apnea    Past Surgical History:  Procedure Laterality Date   COLON SURGERY     COLONOSCOPY WITH PROPOFOL  N/A 09/30/2020   Procedure: COLONOSCOPY WITH PROPOFOL ;  Surgeon: Jinny Carmine, MD;  Location: [facility];  Service: Endoscopy;  Laterality: N/A;   JOINT REPLACEMENT     KNEE SURGERY Right    7 total surgeries   LAPAROTOMY N/A 06/26/2024   Procedure: LAPAROTOMY, EXPLORATORY;  Surgeon: Curvin Deward MOULD, MD;  Location: [facility] OR;  Service: General;  Laterality: N/A;  Lysis of adhesions, small bowel resection, repair of paristomal hernia, revision of decending colostomy   LYSIS OF ADHESION  10/15/2020   Procedure: LYSIS OF ADHESION;  Surgeon: Lane Shope, MD;  Location: [facility] ORS;  Service: General;;   REPLACEMENT TOTAL KNEE Right    XI ROBOT ASSISTED DIAGNOSTIC LAPAROSCOPY N/A 10/15/2020    Procedure: XI ROBOT ASSISTED DIAGNOSTIC LAPAROSCOPY, HARTMAN'S;  Surgeon: Lane Shope, MD;  Location: [facility] ORS;  Service: General;  Laterality: N/A;    Home Medications:  Medications Prior to Admission  Medication Sig Dispense Refill Last Dose/Taking   acetaminophen  (TYLENOL ) 500 MG tablet Take 1,000 mg by mouth every 6 (six) hours as needed for mild pain (pain score 1-3) or moderate pain (pain score 4-6).   Past Week   albuterol  (VENTOLIN  HFA) 108 (90 Base) MCG/ACT inhaler Inhale 2 puffs into the lungs every 6 (six) hours as needed for wheezing or shortness of breath. 18 g 2 01/24/2025   bismuth  subsalicylate (PEPTO BISMOL) 262 MG chewable tablet Chew 262-524 mg by mouth as needed (nausea/indigestion.).   Past Week   cyclobenzaprine  (FLEXERIL ) 10 MG tablet Take 1 tablet (10 mg total) by mouth 3 (three) times daily as needed for muscle spasms. (Patient taking differently: Take 10 mg by mouth 3 (three) times daily.) 30 tablet 1 01/22/2025   gabapentin  (NEURONTIN ) 300 MG capsule TAKE 1 CAPSULE BY MOUTH THREE TIMES A DAY 30 capsule 1 01/24/2025 Bedtime   ibuprofen  (ADVIL ) 200 MG tablet Take 800 mg by mouth 2 (two) times daily as needed for moderate pain (pain score 4-6).   01/18/2025   Iodine  Strong, Lugols, (IODINE  STRONG PO) Take 6.25 mg by mouth daily.   01/18/2025   Iron , Ferrous Sulfate , 325 (65 Fe) MG TABS Take 325 mg by mouth every other day. 30 tablet 2 01/22/2025  Multiple Vitamin (MULTIVITAMIN WITH MINERALS) TABS tablet Take 1 tablet by mouth daily.   01/18/2025   omeprazole (PRILOSEC OTC) 20 MG tablet Take 20-40 mg by mouth daily as needed (heartburn).   Past Week   ondansetron  (ZOFRAN -ODT) 4 MG disintegrating tablet Take 1 tablet (4 mg total) by mouth every 8 (eight) hours as needed for nausea or vomiting. 30 tablet 1 01/24/2025 Bedtime   Oxycodone  HCl 10 MG TABS Take 1 tablet (10 mg total) by mouth every 6 (six) hours as needed. (Patient taking differently: Take 10 mg by mouth in the  morning, at noon, and at bedtime.) 90 tablet 0 01/25/2025 at  4:00 AM   polyethylene glycol (MIRALAX  / GLYCOLAX ) 17 g packet Take 17 g by mouth daily as needed for mild constipation. (Patient taking differently: Take 17 g by mouth in the morning and at bedtime.)   01/24/2025   Soft Lens Products (REWETTING DROPS) SOLN Place 1 drop into both eyes as needed (dry/irritated eyes).   Past Month   Vitamin D , Ergocalciferol , (DRISDOL ) 1.25 MG (50000 UNIT) CAPS capsule Take 1 capsule (50,000 Units total) by mouth every 7 (seven) days. (Patient taking differently: Take 50,000 Units by mouth every Monday.) 12 capsule 0 01/14/2025   diclofenac  Sodium (VOLTAREN ) 1 % GEL Apply 1 Application topically 4 (four) times daily as needed (knee pain.).   More than a month   Allergies: Allergies[1]  Family History  Problem Relation Age of Onset   Depression Mother    Dementia Mother    Heart disease Father    Heart attack Father    Diabetes Father    High Cholesterol Father    High blood pressure Father    Heart disease Paternal Grandmother    Heart attack Paternal Grandmother    Diabetes Paternal Grandmother    Heart disease Paternal Grandfather    High Cholesterol Paternal Grandfather    Cancer Maternal Aunt        lung   Hypertension Maternal Uncle    Cancer Maternal Uncle        lung   Diabetes Paternal Aunt    Heart disease Paternal Uncle    Cancer Paternal Uncle    Heart attack Paternal Uncle    Diabetes Paternal Uncle    High Cholesterol Paternal Uncle    Social History:  reports that he quit smoking about 11 years ago. His smoking use included cigars. He has never been exposed to tobacco smoke. He has never used smokeless tobacco. He reports that he does not currently use alcohol after a past usage of about 13.0 standard drinks of alcohol per week. He reports current drug use. Frequency: 7.00 times per week. Drug: Marijuana.  ROS: A complete review of systems was performed.  All systems are  negative except for pertinent findings as noted. Review of Systems  All other systems reviewed and are negative.    Physical Exam:  Vital signs in last 24 hours: Temp:  [98 F (36.7 C)] 98 F (36.7 C) (01/30 0535) Pulse Rate:  [70] 70 (01/30 0535) Resp:  [18] 18 (01/30 0535) BP: (152)/(102) 152/102 (01/30 0535) SpO2:  [99 %] 99 % (01/30 0535) Weight:  [116.6 kg] 116.6 kg (01/30 9367) General:  Alert and oriented, No acute distress HEENT: Normocephalic, atraumatic Cardiovascular: Regular rate and rhythm Lungs: Regular rate and effort Abdomen: Soft, nontender, nondistended, no abdominal masses Back: No CVA tenderness Extremities: No edema Neurologic: Grossly intact  Laboratory Data:  No results found for this  or any previous visit (from the past 24 hours). No results found for this or any previous visit (from the past 240 hours). Creatinine: No results for input(s): CREATININE in the last 168 hours.  Impression/Assessment:  Colostomy reversal, parastomal hernia-  Plan:  I discussed with the patient the nature, potential benefits, risks and alternatives to cystoscopy with bilateral retrograde injection of firefly fluorescent dye, including side effects of the proposed treatment, the likelihood of the patient achieving the goals of the procedure, and any potential problems that might occur during the procedure or recuperation. All questions answered. Patient elects to proceed.    Donnice Brooks 01/25/2025     [1]  Allergies Allergen Reactions   Butrans [Buprenorphine Hcl] Shortness Of Breath and Nausea And Vomiting   Nucynta [Tapentadol Hydrochloride] Anaphylaxis   Glucose Polymer Other (See Comments)    Poison   [place]ac Other (See Comments)    Poison   Bactrim  [Sulfamethoxazole -Trimethoprim ] Itching and Rash

## 2025-01-26 ENCOUNTER — Encounter (HOSPITAL_COMMUNITY): Payer: Self-pay | Admitting: Surgery

## 2025-01-26 LAB — BASIC METABOLIC PANEL WITH GFR
Anion gap: 9 (ref 5–15)
BUN: <5 mg/dL — ABNORMAL LOW (ref 6–20)
CO2: 24 mmol/L (ref 22–32)
Calcium: 8.7 mg/dL — ABNORMAL LOW (ref 8.9–10.3)
Chloride: 106 mmol/L (ref 98–111)
Creatinine, Ser: 0.79 mg/dL (ref 0.61–1.24)
GFR, Estimated: >60 mL/min
Glucose, Bld: 121 mg/dL — ABNORMAL HIGH (ref 70–99)
Potassium: 4.2 mmol/L (ref 3.5–5.1)
Sodium: 139 mmol/L (ref 135–145)

## 2025-01-26 LAB — CBC
HCT: 36.8 % — ABNORMAL LOW (ref 39.0–52.0)
Hemoglobin: 11.8 g/dL — ABNORMAL LOW (ref 13.0–17.0)
MCH: 26.2 pg (ref 26.0–34.0)
MCHC: 32.1 g/dL (ref 30.0–36.0)
MCV: 81.8 fL (ref 80.0–100.0)
Platelets: 327 10*3/uL (ref 150–400)
RBC: 4.5 MIL/uL (ref 4.22–5.81)
RDW: 15.1 % (ref 11.5–15.5)
WBC: 10 10*3/uL (ref 4.0–10.5)
nRBC: 0 % (ref 0.0–0.2)

## 2025-01-26 LAB — MAGNESIUM: Magnesium: 1.7 mg/dL (ref 1.7–2.4)

## 2025-01-26 MED ORDER — IBUPROFEN 400 MG PO TABS: 400.0000 mg | ORAL_TABLET | Freq: Four times a day (QID) | ORAL | Status: DC | PRN

## 2025-01-26 MED ORDER — OXYCODONE HCL 5 MG PO TABS
5.0000 mg | ORAL_TABLET | Freq: Four times a day (QID) | ORAL | Status: DC | PRN
  Administered 2025-01-26 (×2): 5 mg via ORAL
  Filled 2025-01-26 (×3): qty 1

## 2025-01-26 MED ORDER — HYDROMORPHONE HCL 1 MG/ML IJ SOLN
0.5000 mg | INTRAMUSCULAR | Status: DC | PRN
  Administered 2025-01-26 – 2025-01-27 (×5): 1 mg via INTRAVENOUS
  Filled 2025-01-26 (×5): qty 1

## 2025-01-26 NOTE — Progress Notes (Signed)
" °  1 Day Post-Op   Chief Complaint/Subjective: Concern around drain output yesterday, tolerating liquids, no flatus, tramadol  not helping pain  Objective: Vital signs in last 24 hours: Temp:  [97.7 F (36.5 C)-98.6 F (37 C)] 98.6 F (37 C) (01/31 0623) Pulse Rate:  [56-97] 72 (01/31 0623) Resp:  [9-21] 18 (01/31 0623) BP: (143-206)/(73-103) 152/87 (01/31 0623) SpO2:  [99 %-100 %] 100 % (01/31 0623) Weight:  [116.5 kg] 116.5 kg (01/31 0500)   Intake/Output from previous day: 01/30 0701 - 01/31 0700 In: 2790.3 [P.O.:600; I.V.:1990.3; IV Piggyback:200] Out: 4605 [Urine:3625; Drains:880; Blood:100]  PE: Gen: NAd Resp: nonlabored Card: RRR Abd: soft, drain with moderate bloody output, min seep through over other bandages, soft  Lab Results:  Recent Labs    01/26/25 0516  WBC 10.0  HGB 11.8*  HCT 36.8*  PLT 327   Recent Labs    01/26/25 0516  NA 139  K 4.2  CL 106  CO2 24  GLUCOSE 121*  BUN <5*  CREATININE 0.79  CALCIUM  8.7*   No results for input(s): LABPROT, INR in the last 72 hours.    Component Value Date/Time   NA 139 01/26/2025 0516   NA 140 09/07/2024 1136   K 4.2 01/26/2025 0516   CL 106 01/26/2025 0516   CO2 24 01/26/2025 0516   GLUCOSE 121 (H) 01/26/2025 0516   BUN <5 (L) 01/26/2025 0516   BUN 6 09/07/2024 1136   CREATININE 0.79 01/26/2025 0516   CALCIUM  8.7 (L) 01/26/2025 0516   PROT 6.9 01/17/2025 0855   PROT 7.1 09/07/2024 1136   ALBUMIN  3.9 01/17/2025 0855   ALBUMIN  4.3 09/07/2024 1136   AST 22 01/17/2025 0855   ALT 12 01/17/2025 0855   ALKPHOS 86 01/17/2025 0855   BILITOT 0.3 01/17/2025 0855   BILITOT 0.3 09/07/2024 1136   GFRNONAA >60 01/26/2025 0516   GFRAA >60 09/29/2020 0901    Assessment/Plan  s/p Procedures: CLOSURE, COLOSTOMY, ROBOT-ASSISTED REPAIR, HERNIA, PARASTOMAL LYSIS, ADHESIONS, LAPAROSCOPIC SIGMOIDOSCOPY, FLEXIBLE CYSTOSCOPY WITH INDOCYANINE GREEN  IMAGING (ICG) 01/25/2025  -change pain med to oxy  FEN -  soft diet VTE - continue lovenox  ID - periop antibiotics Disposition - await return of bowel function   LOS: 1 day   I reviewed last 24 h vitals and pain scores, last 48 h intake and output, last 24 h labs and trends, and last 24 h imaging results.  This care required moderate level of medical decision making.   Herlene Righter [facility] 01/26/2025, 7:58 AM Please see Amion for pager number during day hours 7:00am-4:30pm or 7:00am -11:30am on weekends   "

## 2025-01-26 NOTE — Plan of Care (Signed)
" °  Problem: Education: Goal: Understanding of discharge needs will improve Outcome: Progressing Goal: Verbalization of understanding of the causes of altered bowel function will improve Outcome: Progressing   Problem: Activity: Goal: Ability to tolerate increased activity will improve Outcome: Progressing   Problem: Bowel/Gastric: Goal: Gastrointestinal status for postoperative course will improve Outcome: Progressing   Problem: Health Behavior/Discharge Planning: Goal: Identification of community resources to assist with postoperative recovery needs will improve Outcome: Progressing   Problem: Nutritional: Goal: Will attain and maintain optimal nutritional status will improve Outcome: Progressing   Problem: Clinical Measurements: Goal: Postoperative complications will be avoided or minimized Outcome: Progressing   Problem: Respiratory: Goal: Respiratory status will improve Outcome: Progressing   Problem: Skin Integrity: Goal: Will show signs of wound healing Outcome: Progressing   Problem: Education: Goal: Knowledge of the prescribed therapeutic regimen will improve Outcome: Progressing   Problem: Bowel/Gastric: Goal: Gastrointestinal status for postoperative course will improve Outcome: Progressing   Problem: Cardiac: Goal: Ability to maintain an adequate cardiac output Outcome: Progressing Goal: Will show no evidence of cardiac arrhythmias Outcome: Progressing   Problem: Nutritional: Goal: Will attain and maintain optimal nutritional status Outcome: Progressing   Problem: Neurological: Goal: Will regain or maintain usual level of consciousness Outcome: Progressing   Problem: Clinical Measurements: Goal: Ability to maintain clinical measurements within normal limits Outcome: Progressing Goal: Postoperative complications will be avoided or minimized Outcome: Progressing   Problem: Respiratory: Goal: Will regain and/or maintain adequate  ventilation Outcome: Progressing Goal: Respiratory status will improve Outcome: Progressing   Problem: Skin Integrity: Goal: Demonstrates signs of wound healing without infection Outcome: Progressing   Problem: Urinary Elimination: Goal: Will remain free from infection Outcome: Progressing Goal: Ability to achieve and maintain adequate urine output Outcome: Progressing   Problem: Education: Goal: Knowledge of General Education information will improve Description: Including pain rating scale, medication(s)/side effects and non-pharmacologic comfort measures Outcome: Progressing   Problem: Health Behavior/Discharge Planning: Goal: Ability to manage health-related needs will improve Outcome: Progressing   Problem: Clinical Measurements: Goal: Ability to maintain clinical measurements within normal limits will improve Outcome: Progressing Goal: Will remain free from infection Outcome: Progressing Goal: Diagnostic test results will improve Outcome: Progressing Goal: Respiratory complications will improve Outcome: Progressing Goal: Cardiovascular complication will be avoided Outcome: Progressing   Problem: Activity: Goal: Risk for activity intolerance will decrease Outcome: Progressing   Problem: Nutrition: Goal: Adequate nutrition will be maintained Outcome: Progressing   Problem: Coping: Goal: Level of anxiety will decrease Outcome: Progressing   Problem: Elimination: Goal: Will not experience complications related to bowel motility Outcome: Progressing Goal: Will not experience complications related to urinary retention Outcome: Progressing   Problem: Pain Managment: Goal: General experience of comfort will improve and/or be controlled Outcome: Progressing   Problem: Safety: Goal: Ability to remain free from injury will improve Outcome: Progressing   Problem: Skin Integrity: Goal: Risk for impaired skin integrity will decrease Outcome: Progressing   "

## 2025-01-27 ENCOUNTER — Inpatient Hospital Stay (HOSPITAL_COMMUNITY)

## 2025-01-27 LAB — CBC
HCT: 38.2 % — ABNORMAL LOW (ref 39.0–52.0)
Hemoglobin: 12 g/dL — ABNORMAL LOW (ref 13.0–17.0)
MCH: 26 pg (ref 26.0–34.0)
MCHC: 31.4 g/dL (ref 30.0–36.0)
MCV: 82.7 fL (ref 80.0–100.0)
Platelets: 303 10*3/uL (ref 150–400)
RBC: 4.62 MIL/uL (ref 4.22–5.81)
RDW: 15.3 % (ref 11.5–15.5)
WBC: 11.2 10*3/uL — ABNORMAL HIGH (ref 4.0–10.5)
nRBC: 0 % (ref 0.0–0.2)

## 2025-01-27 MED ORDER — OXYCODONE HCL 5 MG PO TABS
10.0000 mg | ORAL_TABLET | ORAL | Status: DC | PRN
  Administered 2025-01-27: 10 mg via ORAL
  Filled 2025-01-27: qty 2

## 2025-01-27 MED ORDER — IBUPROFEN 400 MG PO TABS
400.0000 mg | ORAL_TABLET | Freq: Four times a day (QID) | ORAL | Status: DC
  Administered 2025-01-27 – 2025-01-30 (×10): 400 mg via ORAL
  Filled 2025-01-27 (×11): qty 1

## 2025-01-27 MED ORDER — HYDROMORPHONE HCL 1 MG/ML IJ SOLN
0.5000 mg | INTRAMUSCULAR | Status: DC | PRN
  Administered 2025-01-27 – 2025-01-28 (×5): 1 mg via INTRAVENOUS
  Filled 2025-01-27 (×5): qty 1

## 2025-01-27 MED ORDER — ALBUTEROL SULFATE (2.5 MG/3ML) 0.083% IN NEBU
2.5000 mg | INHALATION_SOLUTION | Freq: Four times a day (QID) | RESPIRATORY_TRACT | Status: DC | PRN
  Administered 2025-01-27: 2.5 mg via RESPIRATORY_TRACT
  Filled 2025-01-27: qty 3

## 2025-01-27 NOTE — Progress Notes (Signed)
" °  2 Days Post-Op   Chief Complaint/Subjective: Pain issues especially left incision, some small bloody BM, tolerating diet  Objective: Vital signs in last 24 hours: Temp:  [98 F (36.7 C)-99.2 F (37.3 C)] 99.2 F (37.3 C) (01/31 2146) Pulse Rate:  [87-103] 87 (01/31 2146) Resp:  [16-18] 16 (01/31 2146) BP: (151-164)/(85-108) 157/85 (01/31 2146) SpO2:  [98 %-99 %] 99 % (01/31 2146) Last BM Date : 01/26/25 Intake/Output from previous day: 01/31 0701 - 02/01 0700 In: 480 [P.O.:480] Out: 1635 [Urine:1550; Drains:85]  PE: Gen: NAd Resp: nonlabored Card: RRR Abd: soft, incisions c/d/I, jp thinner serosang output  Lab Results:  Recent Labs    01/26/25 0516 01/27/25 0603  WBC 10.0 11.2*  HGB 11.8* 12.0*  HCT 36.8* 38.2*  PLT 327 303   Recent Labs    01/26/25 0516  NA 139  K 4.2  CL 106  CO2 24  GLUCOSE 121*  BUN <5*  CREATININE 0.79  CALCIUM  8.7*   No results for input(s): LABPROT, INR in the last 72 hours.    Component Value Date/Time   NA 139 01/26/2025 0516   NA 140 09/07/2024 1136   K 4.2 01/26/2025 0516   CL 106 01/26/2025 0516   CO2 24 01/26/2025 0516   GLUCOSE 121 (H) 01/26/2025 0516   BUN <5 (L) 01/26/2025 0516   BUN 6 09/07/2024 1136   CREATININE 0.79 01/26/2025 0516   CALCIUM  8.7 (L) 01/26/2025 0516   PROT 6.9 01/17/2025 0855   PROT 7.1 09/07/2024 1136   ALBUMIN  3.9 01/17/2025 0855   ALBUMIN  4.3 09/07/2024 1136   AST 22 01/17/2025 0855   ALT 12 01/17/2025 0855   ALKPHOS 86 01/17/2025 0855   BILITOT 0.3 01/17/2025 0855   BILITOT 0.3 09/07/2024 1136   GFRNONAA >60 01/26/2025 0516   GFRAA >60 09/29/2020 0901    Assessment/Plan  s/p Procedures: CLOSURE, COLOSTOMY, ROBOT-ASSISTED REPAIR, HERNIA, PARASTOMAL LYSIS, ADHESIONS, LAPAROSCOPIC SIGMOIDOSCOPY, FLEXIBLE CYSTOSCOPY WITH INDOCYANINE GREEN  IMAGING (ICG) 01/25/2025    FEN - soft diet VTE - lovenox  ID - no issues Disposition - pain issues, await full normal BM   LOS: 2 days    I reviewed last 24 h vitals and pain scores, last 48 h intake and output, last 24 h labs and trends, and last 24 h imaging results.  This care required moderate level of medical decision making.   Herlene Righter [facility] at [facility] 01/27/2025, 7:51 AM Please see Amion for pager number during day hours 7:00am-4:30pm or 7:00am -11:30am on weekends   "

## 2025-01-28 DIAGNOSIS — F1190 Opioid use, unspecified, uncomplicated: Secondary | ICD-10-CM | POA: Diagnosis present

## 2025-01-28 LAB — SURGICAL PATHOLOGY

## 2025-01-28 LAB — CBC
HCT: 34.2 % — ABNORMAL LOW (ref 39.0–52.0)
Hemoglobin: 11 g/dL — ABNORMAL LOW (ref 13.0–17.0)
MCH: 26.3 pg (ref 26.0–34.0)
MCHC: 32.2 g/dL (ref 30.0–36.0)
MCV: 81.6 fL (ref 80.0–100.0)
Platelets: 283 10*3/uL (ref 150–400)
RBC: 4.19 MIL/uL — ABNORMAL LOW (ref 4.22–5.81)
RDW: 15.2 % (ref 11.5–15.5)
WBC: 12.8 10*3/uL — ABNORMAL HIGH (ref 4.0–10.5)
nRBC: 0 % (ref 0.0–0.2)

## 2025-01-28 MED ORDER — HYDROMORPHONE HCL 1 MG/ML IJ SOLN
1.0000 mg | INTRAMUSCULAR | Status: DC | PRN
  Administered 2025-01-28 – 2025-01-29 (×5): 1 mg via INTRAVENOUS
  Filled 2025-01-28 (×5): qty 1

## 2025-01-28 MED ORDER — ADULT MULTIVITAMIN W/MINERALS CH
1.0000 | ORAL_TABLET | Freq: Every day | ORAL | Status: DC
  Administered 2025-01-28 – 2025-01-30 (×3): 1 via ORAL
  Filled 2025-01-28 (×3): qty 1

## 2025-01-28 MED ORDER — DICLOFENAC SODIUM 1 % EX GEL: 4.0000 g | Freq: Four times a day (QID) | CUTANEOUS | Status: DC | PRN

## 2025-01-28 MED ORDER — METHOCARBAMOL 1000 MG/10ML IJ SOLN
500.0000 mg | Freq: Three times a day (TID) | INTRAMUSCULAR | Status: DC
  Administered 2025-01-28 – 2025-01-29 (×3): 500 mg via INTRAVENOUS
  Filled 2025-01-28 (×3): qty 10

## 2025-01-28 MED ORDER — HOME MED STORE IN PYXIS
1.0000 | Freq: Every day | Status: DC
  Administered 2025-01-29: 1 via ORAL
  Filled 2025-01-28 (×5): qty 1

## 2025-01-28 MED ORDER — BOOST PLUS PO LIQD
237.0000 mL | Freq: Two times a day (BID) | ORAL | Status: DC
  Administered 2025-01-28: 237 mL via ORAL
  Filled 2025-01-28 (×5): qty 237

## 2025-01-28 MED ORDER — LACTATED RINGERS IV BOLUS
1000.0000 mL | Freq: Once | INTRAVENOUS | Status: AC
  Administered 2025-01-28: 1000 mL via INTRAVENOUS

## 2025-01-28 MED ORDER — ACETAMINOPHEN 500 MG PO TABS
1000.0000 mg | ORAL_TABLET | Freq: Four times a day (QID) | ORAL | Status: DC
  Administered 2025-01-28 – 2025-01-30 (×7): 1000 mg via ORAL
  Filled 2025-01-28 (×8): qty 2

## 2025-01-28 MED ORDER — METOCLOPRAMIDE HCL 5 MG PO TABS
5.0000 mg | ORAL_TABLET | Freq: Three times a day (TID) | ORAL | Status: DC
  Administered 2025-01-28 – 2025-01-30 (×8): 5 mg via ORAL
  Filled 2025-01-28 (×8): qty 1

## 2025-01-28 MED ORDER — LACTATED RINGERS IV BOLUS: 1000.0000 mL | Freq: Three times a day (TID) | INTRAVENOUS | Status: AC | PRN

## 2025-01-28 MED ORDER — IODINE STRONG (LUGOLS) 5 % PO SOLN
0.1000 mL | Freq: Every day | ORAL | Status: DC
  Administered 2025-01-28: 0.1 mL via ORAL
  Filled 2025-01-28: qty 0.1

## 2025-01-28 MED ORDER — GABAPENTIN 100 MG PO CAPS
300.0000 mg | ORAL_CAPSULE | Freq: Three times a day (TID) | ORAL | Status: DC
  Administered 2025-01-28 – 2025-01-30 (×7): 300 mg via ORAL
  Filled 2025-01-28 (×7): qty 3

## 2025-01-28 MED ORDER — PANTOPRAZOLE SODIUM 40 MG PO TBEC
40.0000 mg | DELAYED_RELEASE_TABLET | Freq: Two times a day (BID) | ORAL | Status: DC
  Administered 2025-01-28 – 2025-01-30 (×5): 40 mg via ORAL
  Filled 2025-01-28 (×5): qty 1

## 2025-01-28 NOTE — Progress Notes (Signed)
" °   01/28/25 1043  TOC Brief Assessment  Insurance and Status Reviewed ([employer] MEDICARE / [employer] MEDICARE HMO)  Patient has primary care physician Yes Garnetta, Toribio POUR, MD)  Home environment has been reviewed Single family home  Prior level of function: Independent with ADL's  Prior/Current Home Services No current home services  Social Drivers of Health Review SDOH reviewed interventions complete  Readmission risk has been reviewed Yes  Transition of care needs no transition of care needs at this time   Pt screened. SDOH needs identified for food insecurity. Food resources added to AVS. No further ICM needs identified. Please place a consult if any ICM needs arise.  "

## 2025-01-28 NOTE — Plan of Care (Signed)
" °  Problem: Clinical Measurements: Goal: Ability to maintain clinical measurements within normal limits Outcome: Progressing   Problem: Skin Integrity: Goal: Demonstrates signs of wound healing without infection Outcome: Progressing   Problem: Urinary Elimination: Goal: Will remain free from infection Outcome: Progressing   "

## 2025-01-29 ENCOUNTER — Ambulatory Visit: Payer: Self-pay | Admitting: Surgery

## 2025-01-29 ENCOUNTER — Encounter (HOSPITAL_COMMUNITY): Payer: Self-pay | Admitting: Surgery

## 2025-01-29 DIAGNOSIS — E876 Hypokalemia: Secondary | ICD-10-CM | POA: Diagnosis not present

## 2025-01-29 LAB — CBC
HCT: 36.9 % — ABNORMAL LOW (ref 39.0–52.0)
Hemoglobin: 11.4 g/dL — ABNORMAL LOW (ref 13.0–17.0)
MCH: 25.9 pg — ABNORMAL LOW (ref 26.0–34.0)
MCHC: 30.9 g/dL (ref 30.0–36.0)
MCV: 83.9 fL (ref 80.0–100.0)
Platelets: 328 10*3/uL (ref 150–400)
RBC: 4.4 MIL/uL (ref 4.22–5.81)
RDW: 14.8 % (ref 11.5–15.5)
WBC: 16.1 10*3/uL — ABNORMAL HIGH (ref 4.0–10.5)
nRBC: 0 % (ref 0.0–0.2)

## 2025-01-29 LAB — POTASSIUM: Potassium: 3.3 mmol/L — ABNORMAL LOW (ref 3.5–5.1)

## 2025-01-29 LAB — MAGNESIUM: Magnesium: 1.9 mg/dL (ref 1.7–2.4)

## 2025-01-29 LAB — TSH: TSH: 2.19 u[IU]/mL (ref 0.350–4.500)

## 2025-01-29 LAB — CREATININE, SERUM
Creatinine, Ser: 0.82 mg/dL (ref 0.61–1.24)
GFR, Estimated: >60 mL/min

## 2025-01-29 MED ORDER — METOPROLOL TARTRATE 12.5 MG HALF TABLET
12.5000 mg | ORAL_TABLET | Freq: Two times a day (BID) | ORAL | Status: DC
  Administered 2025-01-29 (×2): 12.5 mg via ORAL
  Filled 2025-01-29 (×2): qty 1

## 2025-01-29 MED ORDER — MAGNESIUM SULFATE 2 GM/50ML IV SOLN
2.0000 g | Freq: Once | INTRAVENOUS | Status: AC
  Administered 2025-01-29: 2 g via INTRAVENOUS
  Filled 2025-01-29: qty 50

## 2025-01-29 MED ORDER — POTASSIUM CHLORIDE CRYS ER 20 MEQ PO TBCR
40.0000 meq | EXTENDED_RELEASE_TABLET | Freq: Every day | ORAL | Status: DC
  Administered 2025-01-29 – 2025-01-30 (×2): 40 meq via ORAL
  Filled 2025-01-29 (×2): qty 2

## 2025-01-29 MED ORDER — OXYCODONE HCL 5 MG PO TABS
10.0000 mg | ORAL_TABLET | Freq: Four times a day (QID) | ORAL | Status: DC | PRN
  Administered 2025-01-29 – 2025-01-30 (×4): 10 mg via ORAL
  Filled 2025-01-29 (×5): qty 2

## 2025-01-29 MED ORDER — HYDROMORPHONE HCL 1 MG/ML IJ SOLN: 1.0000 mg | INTRAMUSCULAR | Status: DC | PRN

## 2025-01-29 MED ORDER — BISMUTH SUBSALICYLATE 262 MG PO CHEW
262.0000 mg | CHEWABLE_TABLET | ORAL | Status: DC | PRN
  Administered 2025-01-29: 524 mg via ORAL
  Filled 2025-01-29: qty 4
  Filled 2025-01-29: qty 2

## 2025-01-29 MED ORDER — CYCLOBENZAPRINE HCL 10 MG PO TABS
10.0000 mg | ORAL_TABLET | Freq: Three times a day (TID) | ORAL | Status: DC
  Administered 2025-01-29 – 2025-01-30 (×4): 10 mg via ORAL
  Filled 2025-01-29 (×5): qty 1

## 2025-01-29 NOTE — Plan of Care (Signed)
" °  Problem: Activity: Goal: Ability to tolerate increased activity will improve Outcome: Progressing   Problem: Nutritional: Goal: Will attain and maintain optimal nutritional status will improve Outcome: Progressing   Problem: Clinical Measurements: Goal: Postoperative complications will be avoided or minimized Outcome: Progressing   "

## 2025-01-29 NOTE — Plan of Care (Signed)
   Problem: Clinical Measurements: Goal: Will remain free from infection Outcome: Progressing Goal: Diagnostic test results will improve Outcome: Progressing

## 2025-01-30 DIAGNOSIS — R030 Elevated blood-pressure reading, without diagnosis of hypertension: Secondary | ICD-10-CM | POA: Insufficient documentation

## 2025-01-30 MED ORDER — METOPROLOL TARTRATE 25 MG PO TABS
25.0000 mg | ORAL_TABLET | Freq: Two times a day (BID) | ORAL | Status: DC
  Administered 2025-01-30: 25 mg via ORAL
  Filled 2025-01-30: qty 1

## 2025-01-30 MED ORDER — OXYCODONE HCL 10 MG PO TABS: 10.0000 mg | ORAL_TABLET | Freq: Three times a day (TID) | ORAL | 0 refills | Status: AC | PRN

## 2025-01-30 MED ORDER — CYCLOBENZAPRINE HCL 10 MG PO TABS: 10.0000 mg | ORAL_TABLET | Freq: Three times a day (TID) | ORAL | 2 refills | Status: AC | PRN

## 2025-01-30 NOTE — Care Management Important Message (Signed)
 Important Message  Patient Details IM Letter given. Name: Jordan [name] MRN: [id number] Date of Birth: 01/17/1981   Important Message Given:  Yes - Medicare IM     Melba Ates 01/30/2025, 10:50 AM

## 2025-01-30 NOTE — Discharge Summary (Signed)
 " Physician Discharge Summary    Jordan [name] MRN: [id number] DOB/AGE: 03-Jun-1981 = 44 y.o.  Patient Care Team: [name], MD as PCP - General (Family Medicine) Sande Ozell Mt, MD as Referring Physician (Orthopedic Surgery) Jude Harden GAILS, MD as Consulting Physician (Pulmonary Disease) Gordy Channing LABOR, RN as [facility] Jinny Carmine, MD as Consulting Physician (Gastroenterology) Sheldon Standing, MD as Consulting Physician (Colon and Rectal Surgery)  Admit date: 01/25/2025  Discharge date: 01/30/2025  Hospital Stay = 5 days    Discharge Diagnoses:  Principal Problem:   Diverticulitis of colon Active Problems:   Anxiety and depression   Chronic pain syndrome   Essential hypertension   OSA (obstructive sleep apnea)   History of diverticulitis of colon   Prediabetes   Malnutrition of mild degree   Chronic narcotic use   Hypokalemia   Hypomagnesemia   Elevated blood pressure reading   5 Days Post-Op  01/25/2025  POST-OPERATIVE DIAGNOSIS:   -COLOSTOMY OF COLON RESECTION. DESIRE FOR OSTOMY TAKEDOWN -RECURRENT PARASTOMAL HERNIA   PROCEDURE:   -ROBOTIC TAKEDOWN OF END COLOSTOMY WITH ANASTOMOSIS -RESECTION OF SMALL INTESTINE WITH ANASTOMOSIS,  -PRIMARY REPAIR OF RECURRENT INCISIONAL (PARASTOMAL) HERNIA -LYSIS OF ADHESIONS x120 MINUTES (70% OF CASE),  -INTRAOPERATIVE ASSESSMENT OF TISSUE VASCULAR PERFUSION USING ICG (indocyanine green ) IMMUNOFLUORESCENCE,  -TRANSVERSUS ABDOMINIS PLANE (TAP) BLOCK - BILATERAL -FLEXIBLE SIGMOIDOSCOPY   SURGEON:  Standing KYM Sheldon, MD   OR FINDINGS:   Rather dense adhesions especially of some omentum and small bowel to the the anterior abdominal wall.  Loop of ileum going up into the fascial defect consistent with parastomal hernia as suspected on preoperative CT scan.  No necrosis perforation or ischemia.  Softer but large volume of interloop small bowel adhesions as well.  Small bowel resection done of  area with 2 very dense fibrotic adhesions with prior silk suture consistent prior enterotomy repair.  Felt not salvageable with some thickening and stricturing.  Small bowel resection done.  No obvious metastatic disease on visceral parietal peritoneum or liver.   It is a 31mm EEA anastomosis ( distal descending colon  connected to mid rectum.)  It rests 13 cm from the anal verge by rigid proctoscopy.   Moderate diastases of the midline but no major incision there.  A few small Swiss cheese holes in there as well as in the left side containing old omentum only.  FINAL MICROSCOPIC DIAGNOSIS:   A. COLOSTOMY:  Enterocutaneous tissue with mild reactive changes and fibrosis,  consistent with colostomy site changes.  Negative for dysplasia or malignancy.   B. ILEUM, RESECTION:  Segment of small bowel with serosal fibrous adhesions, hemorrhage and  reactive changes.  No evidence of activity, dysplasia or malignancy.   C. ANASTOMIC RING, DISTAL, EXCISION:  Colonic mucosa with no significant diagnostic alteration.  Pericolic chronic inflammation, reactive changes and fibrosis.  Negative for malignancy.   Consults: Case Management / Social Work  Hospital Course:   The patient underwent the surgery above.  Postoperatively, the patient gradually mobilized and advanced to a solid diet.  Pain and other symptoms were treated aggressively.    By the time of discharge, the patient was walking well the hallways, eating food, having flatus.  Pain was well-controlled on an oral medications.  Based on meeting discharge criteria and continuing to recover, I felt it was safe for the patient to be discharged from the hospital to further recover with close followup. Postoperative recommendations were discussed in detail.  They are  written as well.  Discharged Condition: good  Discharge Exam: Blood pressure (!) 160/100, pulse 82, temperature 98.6 F (37 C), temperature source Oral, resp. rate 18, height 5'  6 (1.676 m), weight 123.3 kg, SpO2 99%.  General: Pt awake/alert/oriented x4 in No acute distress Eyes: PERRL, normal EOM.  Sclera clear.  No icterus Neuro: CN II-XII intact w/o focal sensory/motor deficits. Lymph: No head/neck/groin lymphadenopathy Psych:  No delerium/psychosis/paranoia HENT: Normocephalic, Mucus membranes moist.  No thrush Neck: Supple, No tracheal deviation Chest: No chest wall pain w good excursion CV:  Pulses intact.  Regular rhythm MS: Normal AROM mjr joints.  No obvious deformity Abdomen: Soft.  Nondistended.  Mildly tender at incisions only. Old colostomy incision with scant serosanguinous drainage No evidence of peritonitis.  No incarcerated hernias. Ext:  SCDs BLE.  No mjr edema.  No cyanosis Skin: No petechiae / purpura   Disposition:    Follow-up Information     Sheldon Standing, MD Follow up in 3 week(s).   Specialties: General Surgery, Colon and Rectal Surgery Why: To follow up after your operation Contact information: [address] [place] [address] [phone]         [name], MD Follow up on 02/20/2025.   Specialty: Family Medicine Why: To follow up after your operation & check your blood pressure Contact information: [address] [place] [address] [phone]                 Discharge disposition: 01-Home or Self Care       Discharge Instructions     Call MD for:   Complete by: As directed    FEVER > 101.5 F  (temperatures < 101.5 F are not significant)   Call MD for:  extreme fatigue   Complete by: As directed    Call MD for:  persistant dizziness or light-headedness   Complete by: As directed    Call MD for:  persistant nausea and vomiting   Complete by: As directed    Call MD for:  redness, tenderness, or signs of infection (pain, swelling, redness, odor or green/yellow discharge around incision site)   Complete by: As directed    Call MD for:  severe uncontrolled pain    Complete by: As directed    Diet - low sodium heart healthy   Complete by: As directed    Start with a bland diet such as soups, liquids, starchy foods, low fat foods, etc. the first few days at home. Gradually advance to a solid, low-fat, high fiber diet by the end of the first week at home.   Add a fiber supplement to your diet (Metamucil, etc) If you feel full, bloated, or constipated, stay on a full liquid or pureed/blenderized diet for a few days until you feel better and are no longer constipated.   Discharge instructions   Complete by: As directed    See Discharge Instructions If you are not getting better after two weeks or are noticing you are getting worse, contact our office [phone] for further advice.  We may need to adjust your medications, re-evaluate you in the office, send you to the emergency room, or see what other things we can do to help. The clinic staff is available to answer your questions during regular business hours (8:30am-5pm).  Please don't hesitate to call and ask to speak to one of our nurses for clinical concerns.    A surgeon from [facility]  is always on call at the hospitals 24 hours/day If you have a medical emergency, go to the nearest emergency room or call 911.   Discharge wound care:   Complete by: As directed    It is good for closed incisions and even open wounds to be washed every day.  Shower every day.  Short baths are fine.  Wash the incisions and wounds clean with soap & water .    You may leave closed incisions open to air if it is dry.   You may cover the incision with clean gauze & replace it after your daily shower for comfort.  TEGADERM:  You have clear gauze band-aid dressings over your closed incision(s).  Remove the dressings 2 days after surgery.   Driving Restrictions   Complete by: As directed    You may drive when: - you are no longer taking narcotic prescription pain medication - you can comfortably wear a  seatbelt - you can safely make sudden turns/stops without pain.   Increase activity slowly   Complete by: As directed    Start light daily activities --- self-care, walking, climbing stairs- beginning the day after surgery.  Gradually increase activities as tolerated.  Control your pain to be active.  Stop when you are tired.  Ideally, walk several times a day, eventually an hour a day.   Most people are back to most day-to-day activities in a few weeks.  It takes 4-6 weeks to get back to unrestricted, intense activity. If you can walk 30 minutes without difficulty, it is safe to try more intense activity such as jogging, treadmill, bicycling, low-impact aerobics, swimming, etc. Save the most intensive and strenuous activity for last (Usually 4-8 weeks after surgery) such as sit-ups, heavy lifting, contact sports, etc.  Refrain from any intense heavy lifting or straining until you are off narcotics for pain control.  You will have off days, but things should improve week-by-week. DO NOT PUSH THROUGH PAIN.  Let pain be your guide: If it hurts to do something, don't do it.   Lifting restrictions   Complete by: As directed    If you can walk 30 minutes without difficulty, it is safe to try more intense activity such as jogging, treadmill, bicycling, low-impact aerobics, swimming, etc. Save the most intensive and strenuous activity for last (Usually 4-8 weeks after surgery) such as sit-ups, heavy lifting, contact sports, etc.   Refrain from any intense heavy lifting or straining until you are off narcotics for pain control.  You will have off days, but things should improve week-by-week. DO NOT PUSH THROUGH PAIN.  Let pain be your guide: If it hurts to do something, don't do it.  Pain is your body warning you to avoid that activity for another week until the pain goes down.   May shower / Bathe   Complete by: As directed    May walk up steps   Complete by: As directed    Remove dressing in 48 hours    Complete by: As directed    Make sure all dressings have been removed on the second day after surgery = 2/1 Sunday Leave incisions open to air.  OK to cover incisions with gauze or bandages as desired   Sexual Activity Restrictions   Complete by: As directed    You may have sexual intercourse when it is comfortable. If it hurts to do something, stop.       Allergies as of 01/30/2025  Reactions   Butrans [buprenorphine Hcl] Shortness Of Breath, Nausea And Vomiting   Nucynta [tapentadol Hydrochloride] Anaphylaxis   Glucose Polymer Other (See Comments)   Poison   Sumac Other (See Comments)   Poison   Bactrim  [sulfamethoxazole -trimethoprim ] Itching, Rash        Medication List     TAKE these medications    acetaminophen  500 MG tablet Commonly known as: TYLENOL  Take 1,000 mg by mouth every 6 (six) hours as needed for mild pain (pain score 1-3) or moderate pain (pain score 4-6).   albuterol  108 (90 Base) MCG/ACT inhaler Commonly known as: VENTOLIN  HFA Inhale 2 puffs into the lungs every 6 (six) hours as needed for wheezing or shortness of breath.   bismuth  subsalicylate 262 MG chewable tablet Commonly known as: PEPTO BISMOL Chew 262-524 mg by mouth as needed (nausea/indigestion.).   cyclobenzaprine  10 MG tablet Commonly known as: FLEXERIL  Take 1 tablet (10 mg total) by mouth 3 (three) times daily as needed for muscle spasms. What changed:  when to take this reasons to take this   gabapentin  300 MG capsule Commonly known as: NEURONTIN  TAKE 1 CAPSULE BY MOUTH THREE TIMES A DAY   ibuprofen  200 MG tablet Commonly known as: ADVIL  Take 800 mg by mouth 2 (two) times daily as needed for moderate pain (pain score 4-6).   IODINE  STRONG PO Take 6.25 mg by mouth daily.   Iron  (Ferrous Sulfate ) 325 (65 Fe) MG Tabs Take 325 mg by mouth every other day.   multivitamin with minerals Tabs tablet Take 1 tablet by mouth daily.   omeprazole 20 MG tablet Commonly known  as: PRILOSEC OTC Take 20-40 mg by mouth daily as needed (heartburn).   ondansetron  4 MG disintegrating tablet Commonly known as: ZOFRAN -ODT Take 1 tablet (4 mg total) by mouth every 8 (eight) hours as needed for nausea or vomiting.   Oxycodone  HCl 10 MG Tabs Take 1 tablet (10 mg total) by mouth every 6 (six) hours as needed. What changed: when to take this   Oxycodone  HCl 10 MG Tabs Take 1 tablet (10 mg total) by mouth every 8 (eight) hours as needed (pain). What changed: You were already taking a medication with the same name, and this prescription was added. Make sure you understand how and when to take each.   polyethylene glycol 17 g packet Commonly known as: MIRALAX  / GLYCOLAX  Take 17 g by mouth daily as needed for mild constipation. What changed: when to take this   Rewetting Drops Soln Place 1 drop into both eyes as needed (dry/irritated eyes).   Vitamin D  (Ergocalciferol ) 1.25 MG (50000 UNIT) Caps capsule Commonly known as: DRISDOL  Take 1 capsule (50,000 Units total) by mouth every 7 (seven) days. What changed: when to take this   Voltaren  1 % Gel Generic drug: diclofenac  Sodium Apply 1 Application topically 4 (four) times daily as needed (knee pain.).               Discharge Care Instructions  (From admission, onward)           Start     Ordered   01/25/25 0000  Discharge wound care:       Comments: It is good for closed incisions and even open wounds to be washed every day.  Shower every day.  Short baths are fine.  Wash the incisions and wounds clean with soap & water .    You may leave closed incisions open to air if it is dry.  You may cover the incision with clean gauze & replace it after your daily shower for comfort.  TEGADERM:  You have clear gauze band-aid dressings over your closed incision(s).  Remove the dressings 2 days after surgery.   01/25/25 0800            Significant Diagnostic Studies:  Results for orders placed or performed  during the hospital encounter of 01/25/25 (from the past 72 hours)  CBC     Status: Abnormal   Collection Time: 01/28/25  4:26 AM  Result Value Ref Range   WBC 12.8 (H) 4.0 - 10.5 K/uL   RBC 4.19 (L) 4.22 - 5.81 MIL/uL   Hemoglobin 11.0 (L) 13.0 - 17.0 g/dL   HCT 65.7 (L) 60.9 - 47.9 %   MCV 81.6 80.0 - 100.0 fL   MCH 26.3 26.0 - 34.0 pg   MCHC 32.2 30.0 - 36.0 g/dL   RDW 84.7 88.4 - 84.4 %   Platelets 283 150 - 400 K/uL   nRBC 0.0 0.0 - 0.2 %    Comment: Performed at [facility], [address], [place], [place] [address]  CBC     Status: Abnormal   Collection Time: 01/29/25  1:24 AM  Result Value Ref Range   WBC 16.1 (H) 4.0 - 10.5 K/uL   RBC 4.40 4.22 - 5.81 MIL/uL   Hemoglobin 11.4 (L) 13.0 - 17.0 g/dL   HCT 63.0 (L) 60.9 - 47.9 %   MCV 83.9 80.0 - 100.0 fL   MCH 25.9 (L) 26.0 - 34.0 pg   MCHC 30.9 30.0 - 36.0 g/dL   RDW 85.1 88.4 - 84.4 %   Platelets 328 150 - 400 K/uL   nRBC 0.0 0.0 - 0.2 %    Comment: Performed at [facility], [address] [place], [place] [address]  Creatinine, serum     Status: None   Collection Time: 01/29/25  1:24 AM  Result Value Ref Range   Creatinine, Ser 0.82 0.61 - 1.24 mg/dL   GFR, Estimated >39 >39 mL/min    Comment: (NOTE) Calculated using the CKD-EPI Creatinine Equation (2021) Performed at [facility], [address], [place], [place] [address]   Potassium     Status: Abnormal   Collection Time: 01/29/25  1:24 AM  Result Value Ref Range   Potassium 3.3 (L) 3.5 - 5.1 mmol/L    Comment: Performed at [facility], [address], [place], [place] [address]  TSH     Status: None   Collection Time: 01/29/25  1:24 AM  Result Value Ref Range   TSH 2.190 0.350 - 4.500 uIU/mL    Comment: Performed at [facility], [address], [place], [place] [address]  Magnesium      Status: None   Collection Time: 01/29/25  1:24 AM  Result Value Ref  Range   Magnesium  1.9 1.7 - 2.4 mg/dL    Comment: Performed at [facility], [address] [place], [place] [address]    No results found.  Past Medical History:  Diagnosis Date   Anxiety and depression    Asthma    reactive airway disease   Chronic kidney disease    Chronic pain syndrome    GERD (gastroesophageal reflux disease)    History of kidney stones    HYPERTENSION, MILD    OSTEOARTHRITIS    Pre-diabetes    Recurrent UTI    Sleep apnea  Ventral hernia with gangrene and obstruction 07/26/2024    Past Surgical History:  Procedure Laterality Date   COLON SURGERY     COLONOSCOPY WITH PROPOFOL  N/A 09/30/2020   Procedure: COLONOSCOPY WITH PROPOFOL ;  Surgeon: Jinny Carmine, MD;  Location: [facility];  Service: Endoscopy;  Laterality: N/A;   CYSTOSCOPY WITH INDOCYANINE GREEN  IMAGING (ICG) N/A 01/25/2025   Procedure: CYSTOSCOPY WITH INDOCYANINE GREEN  IMAGING (ICG);  Surgeon: Nieves Cough, MD;  Location: [facility];  Service: Urology;  Laterality: N/A;   FLEXIBLE SIGMOIDOSCOPY N/A 01/25/2025   Procedure: SIGMOIDOSCOPY, FLEXIBLE;  Surgeon: Sheldon Standing, MD;  Location: [facility];  Service: General;  Laterality: N/A;   JOINT REPLACEMENT     KNEE SURGERY Right    7 total surgeries   LAPAROSCOPIC LYSIS OF ADHESIONS N/A 01/25/2025   Procedure: LYSIS, ADHESIONS, LAPAROSCOPIC;  Surgeon: Sheldon Standing, MD;  Location: [facility];  Service: General;  Laterality: N/A;   LAPAROTOMY N/A 06/26/2024   Procedure: LAPAROTOMY, EXPLORATORY;  Surgeon: Curvin Deward MOULD, MD;  Location: [facility];  Service: General;  Laterality: N/A;  Lysis of adhesions, small bowel resection, repair of paristomal hernia, revision of decending colostomy   LYSIS OF ADHESION  10/15/2020   Procedure: LYSIS OF ADHESION;  Surgeon: Lane Shope, MD;  Location: [facility];  Service: General;;   PARASTOMAL HERNIA REPAIR N/A 01/25/2025   Procedure: REPAIR, HERNIA, PARASTOMAL;  Surgeon: Sheldon Standing, MD;   Location: [facility];  Service: General;  Laterality: N/A;   REPLACEMENT TOTAL KNEE Right    XI ROBOT ASSISTED DIAGNOSTIC LAPAROSCOPY N/A 10/15/2020   Procedure: XI ROBOT ASSISTED DIAGNOSTIC LAPAROSCOPY, HARTMAN'S;  Surgeon: Lane Shope, MD;  Location: [facility];  Service: General;  Laterality: N/A;   XI ROBOTIC ASSISTED COLOSTOMY TAKEDOWN N/A 01/25/2025   Procedure: CLOSURE, COLOSTOMY, ROBOT-ASSISTED;  Surgeon: Sheldon Standing, MD;  Location: [facility];  Service: General;  Laterality: N/A;    Social History   Socioeconomic History   Marital status: Married    Spouse name: Designer, Television/film Set   Number of children: 2   Years of education: some college   Highest education level: Not on file  Occupational History    Employer: UNEMPLOYED    Comment: Disabled  Tobacco Use   Smoking status: Former    Types: Cigars    Quit date: 01/18/2014    Years since quitting: 11.0    Passive exposure: Never   Smokeless tobacco: Never  Vaping Use   Vaping status: Never Used  Substance and Sexual Activity   Alcohol use: Not Currently    Alcohol/week: 13.0 standard drinks of alcohol    Types: 6 Cans of beer, 7 Standard drinks or equivalent per week    Comment: quit 12-23-2024   Drug use: Yes    Frequency: 7.0 times per week    Types: Marijuana    Comment: 01-17-2025   Sexual activity: Yes  Other Topics Concern   Not on file  Social History Narrative   Caffeine Use:  6 beverages daily Regular exercise:  2 x weekly   Social Drivers of Health   Tobacco Use: Medium Risk (01/25/2025)   Patient History    Smoking Tobacco Use: Former    Smokeless Tobacco Use: Never    Passive Exposure: Never  Physicist, Medical Strain: Low Risk (05/31/2024)   Overall Financial Resource Strain (CARDIA)    Difficulty of Paying Living Expenses: Not hard at all  Food Insecurity: Food Insecurity Present (01/25/2025)   Epic    Worried About [employer] of  Food in the Last Year: Sometimes true    Ran Out of Food in the Last  Year: Sometimes true  Transportation Needs: No Transportation Needs (01/25/2025)   Epic    Lack of Transportation (Medical): No    Lack of Transportation (Non-Medical): No  Physical Activity: Insufficiently Active (05/31/2024)   Exercise Vital Sign    Days of Exercise per Week: 7 days    Minutes of Exercise per Session: 20 min  Stress: Stress Concern Present (05/31/2024)   [employer] of Occupational Health - Occupational Stress Questionnaire    Feeling of Stress : To some extent  Social Connections: Moderately Isolated (05/31/2024)   Social Connection and Isolation Panel    Frequency of Communication with Friends and Family: Three times a week    Frequency of Social Gatherings with Friends and Family: Not on file    Attends Religious Services: Never    Active Member of Clubs or Organizations: No    Attends Banker Meetings: Never    Marital Status: Married  Catering Manager Violence: Not At Risk (01/25/2025)   Epic    Fear of Current or Ex-Partner: No    Emotionally Abused: No    Physically Abused: No    Sexually Abused: No  Depression (PHQ2-9): Low Risk (01/07/2025)   Depression (PHQ2-9)    PHQ-2 Score: 1  Alcohol Screen: Low Risk (05/31/2024)   Alcohol Screen    Last Alcohol Screening Score (AUDIT): 3  Housing: Low Risk (01/25/2025)   Epic    Unable to Pay for Housing in the Last Year: No    Number of Times Moved in the Last Year: 0    Homeless in the Last Year: No  Utilities: Not At Risk (01/25/2025)   Epic    Threatened with loss of utilities: No  Health Literacy: Adequate Health Literacy (05/31/2024)   B1300 Health Literacy    Frequency of need for help with medical instructions: Never    Family History  Problem Relation Age of Onset   Depression Mother    Dementia Mother    Heart disease Father    Heart attack Father    Diabetes Father    High Cholesterol Father    High blood pressure Father    Heart disease Paternal Grandmother    Heart attack  Paternal Grandmother    Diabetes Paternal Grandmother    Heart disease Paternal Grandfather    High Cholesterol Paternal Grandfather    Cancer Maternal Aunt        lung   Hypertension Maternal Uncle    Cancer Maternal Uncle        lung   Diabetes Paternal Aunt    Heart disease Paternal Uncle    Cancer Paternal Uncle    Heart attack Paternal Uncle    Diabetes Paternal Uncle    High Cholesterol Paternal Uncle     Current Facility-Administered Medications  Medication Dose Route Frequency Provider Last Rate Last Admin   0.9 %  sodium chloride  infusion  250 mL Intravenous PRN Sheldon Standing, MD       acetaminophen  (TYLENOL ) tablet 1,000 mg  1,000 mg Oral Q6H Saxon Barich, MD   1,000 mg at 01/30/25 9385   albuterol  (PROVENTIL ) (2.5 MG/3ML) 0.083% nebulizer solution 2.5 mg  2.5 mg Inhalation Q6H PRN Kinsinger, Herlene Righter, MD   2.5 mg at 01/27/25 1114   alum & mag hydroxide-simeth (MAALOX/MYLANTA) 200-200-20 MG/5ML suspension 30 mL  30 mL Oral Q6H PRN Sheldon Standing, MD  bismuth  subsalicylate (PEPTO BISMOL) chewable tablet 262-524 mg  262-524 mg Oral PRN Sheldon Standing, MD   524 mg at 01/29/25 1055   cyclobenzaprine  (FLEXERIL ) tablet 10 mg  10 mg Oral TID Sheldon Standing, MD   10 mg at 01/29/25 2111   diclofenac  Sodium (VOLTAREN ) 1 % topical gel 4 g  4 g Topical QID PRN Sheldon Standing, MD       diphenhydrAMINE  (BENADRYL ) 12.5 MG/5ML elixir 12.5 mg  12.5 mg Oral Q6H PRN Sheldon Standing, MD       Or   diphenhydrAMINE  (BENADRYL ) injection 12.5 mg  12.5 mg Intravenous Q6H PRN Sheldon Standing, MD       enoxaparin  (LOVENOX ) injection 40 mg  40 mg Subcutaneous Q24H Sheldon Standing, MD   40 mg at 01/29/25 0820   gabapentin  (NEURONTIN ) capsule 300 mg  300 mg Oral TID Sheldon Standing, MD   300 mg at 01/29/25 2111   hydrALAZINE  (APRESOLINE ) injection 10 mg  10 mg Intravenous Q2H PRN Sheldon Standing, MD   10 mg at 01/25/25 1702   HYDROmorphone  (DILAUDID ) injection 1-2 mg  1-2 mg Intravenous Q4H PRN Sheldon Standing,  MD       ibuprofen  (ADVIL ) tablet 400 mg  400 mg Oral Q6H Kinsinger, Herlene Righter, MD   400 mg at 01/30/25 9692   Iodine  Lugols' 6.25 mg tablet (patient home medication)  1 each Oral Daily Sheldon Standing, MD   1 each at 01/29/25 1030   lactated ringers  bolus 1,000 mL  1,000 mL Intravenous Q8H PRN Sheldon Standing, MD       lactose free nutrition (BOOST PLUS) liquid 237 mL  237 mL Oral BID BM Sheldon Standing, MD   237 mL at 01/28/25 1616   magic mouthwash  15 mL Oral QID PRN Sheldon Standing, MD       melatonin tablet 3 mg  3 mg Oral QHS PRN Sheldon Standing, MD   3 mg at 01/28/25 2136   menthol  (CEPACOL) lozenge 3 mg  1 lozenge Oral PRN Sheldon Standing, MD       metoCLOPramide  (REGLAN ) tablet 5 mg  5 mg Oral TID AC & HS Sheldon Standing, MD   5 mg at 01/29/25 2112   metoprolol  tartrate (LOPRESSOR ) injection 5 mg  5 mg Intravenous Q6H PRN Sheldon Standing, MD   5 mg at 01/25/25 1759   metoprolol  tartrate (LOPRESSOR ) tablet 25 mg  25 mg Oral BID Sheldon Standing, MD       multivitamin with minerals tablet 1 tablet  1 tablet Oral Daily Sheldon Standing, MD   1 tablet at 01/29/25 1030   naphazoline-glycerin  (CLEAR EYES REDNESS) ophth solution 1-2 drop  1-2 drop Both Eyes QID PRN Sheldon Standing, MD       ondansetron  (ZOFRAN ) tablet 4 mg  4 mg Oral Q6H PRN Sheldon Standing, MD   4 mg at 01/29/25 1504   Or   ondansetron  (ZOFRAN ) injection 4 mg  4 mg Intravenous Q6H PRN Sheldon Standing, MD   4 mg at 01/29/25 0112   oxyCODONE  (Oxy IR/ROXICODONE ) immediate release tablet 10 mg  10 mg Oral Q6H PRN Sheldon Standing, MD   10 mg at 01/30/25 9386   pantoprazole  (PROTONIX ) EC tablet 40 mg  40 mg Oral BID Sheldon Standing, MD   40 mg at 01/29/25 2111   phenol (CHLORASEPTIC) mouth spray 2 spray  2 spray Mouth/Throat PRN Sheldon Standing, MD       polycarbophil (FIBERCON) tablet 625 mg  625 mg Oral BID  Sheldon Standing, MD   625 mg at 01/29/25 2111   potassium chloride  SA (KLOR-CON  M) CR tablet 40 mEq  40 mEq Oral Daily Sheldon Standing, MD   40 mEq at 01/29/25  1030   prochlorperazine  (COMPAZINE ) tablet 10 mg  10 mg Oral Q6H PRN Sheldon Standing, MD   10 mg at 01/27/25 0831   Or   prochlorperazine  (COMPAZINE ) injection 5-10 mg  5-10 mg Intravenous Q6H PRN Sheldon Standing, MD   10 mg at 01/28/25 0142   simethicone  (MYLICON) chewable tablet 40 mg  40 mg Oral Q6H PRN Sheldon Standing, MD       sodium chloride  (OCEAN) 0.65 % nasal spray 1-2 spray  1-2 spray Each Nare Q6H PRN Sheldon Standing, MD       sodium chloride  flush (NS) 0.9 % injection 3 mL  3 mL Intravenous Q12H Lechelle Wrigley, MD   3 mL at 01/29/25 2112   sodium chloride  flush (NS) 0.9 % injection 3 mL  3 mL Intravenous PRN Sheldon Standing, MD         Allergies[1]  Signed:   Standing KYM Sheldon, MD, FACS, MASCRS Esophageal, Gastrointestinal & Colorectal Surgery Robotic and Minimally Invasive Surgery  [facility] [address], [address] [place], [place] [address] [phone] Fax [phone] Main  CONTACT INFORMATION: Weekday (9AM-5PM): Call [facility] main office at [phone] Weeknight (5PM-9AM) or Weekend/Holiday: Check EPIC Web Links tab & use AMION (password  [email or web address]) for General Surgery [facility] coverage  Please, DO NOT use SecureChat  (it is not reliable communication to reach operating surgeons & will lead to a delay in care).   Epic staff messaging available for outpatient concerns needing 1-2 business day response.      01/30/2025, 7:52 AM      [1]  Allergies Allergen Reactions   Butrans [Buprenorphine Hcl] Shortness Of Breath and Nausea And Vomiting   Nucynta [Tapentadol Hydrochloride] Anaphylaxis   Glucose Polymer Other (See Comments)    Poison   [place] Other (See Comments)    Poison   Bactrim  [Sulfamethoxazole -Trimethoprim ] Itching and Rash   "

## 2025-01-30 NOTE — Plan of Care (Signed)
" °  Problem: Education: Goal: Understanding of discharge needs will improve Outcome: Adequate for Discharge Goal: Verbalization of understanding of the causes of altered bowel function will improve Outcome: Adequate for Discharge   Problem: Activity: Goal: Ability to tolerate increased activity will improve Outcome: Adequate for Discharge   Problem: Bowel/Gastric: Goal: Gastrointestinal status for postoperative course will improve Outcome: Adequate for Discharge   Problem: Health Behavior/Discharge Planning: Goal: Identification of community resources to assist with postoperative recovery needs will improve Outcome: Adequate for Discharge   Problem: Nutritional: Goal: Will attain and maintain optimal nutritional status will improve Outcome: Adequate for Discharge   Problem: Clinical Measurements: Goal: Postoperative complications will be avoided or minimized Outcome: Adequate for Discharge   Problem: Respiratory: Goal: Respiratory status will improve Outcome: Adequate for Discharge   Problem: Skin Integrity: Goal: Will show signs of wound healing Outcome: Adequate for Discharge   Problem: Education: Goal: Knowledge of the prescribed therapeutic regimen will improve Outcome: Adequate for Discharge   Problem: Bowel/Gastric: Goal: Gastrointestinal status for postoperative course will improve Outcome: Adequate for Discharge   Problem: Cardiac: Goal: Ability to maintain an adequate cardiac output Outcome: Adequate for Discharge Goal: Will show no evidence of cardiac arrhythmias Outcome: Adequate for Discharge   Problem: Nutritional: Goal: Will attain and maintain optimal nutritional status Outcome: Adequate for Discharge   Problem: Neurological: Goal: Will regain or maintain usual level of consciousness Outcome: Adequate for Discharge   Problem: Clinical Measurements: Goal: Ability to maintain clinical measurements within normal limits Outcome: Adequate for  Discharge Goal: Postoperative complications will be avoided or minimized Outcome: Adequate for Discharge   Problem: Respiratory: Goal: Will regain and/or maintain adequate ventilation Outcome: Adequate for Discharge Goal: Respiratory status will improve Outcome: Adequate for Discharge   Problem: Skin Integrity: Goal: Demonstrates signs of wound healing without infection Outcome: Adequate for Discharge   Problem: Urinary Elimination: Goal: Will remain free from infection Outcome: Adequate for Discharge Goal: Ability to achieve and maintain adequate urine output Outcome: Adequate for Discharge   Problem: Education: Goal: Knowledge of General Education information will improve Description: Including pain rating scale, medication(s)/side effects and non-pharmacologic comfort measures Outcome: Adequate for Discharge   Problem: Health Behavior/Discharge Planning: Goal: Ability to manage health-related needs will improve Outcome: Adequate for Discharge   Problem: Clinical Measurements: Goal: Ability to maintain clinical measurements within normal limits will improve Outcome: Adequate for Discharge Goal: Will remain free from infection Outcome: Adequate for Discharge Goal: Diagnostic test results will improve Outcome: Adequate for Discharge Goal: Respiratory complications will improve Outcome: Adequate for Discharge Goal: Cardiovascular complication will be avoided Outcome: Adequate for Discharge   Problem: Activity: Goal: Risk for activity intolerance will decrease Outcome: Adequate for Discharge   Problem: Nutrition: Goal: Adequate nutrition will be maintained Outcome: Adequate for Discharge   Problem: Coping: Goal: Level of anxiety will decrease Outcome: Adequate for Discharge   Problem: Elimination: Goal: Will not experience complications related to bowel motility Outcome: Adequate for Discharge Goal: Will not experience complications related to urinary  retention Outcome: Adequate for Discharge   Problem: Pain Managment: Goal: General experience of comfort will improve and/or be controlled Outcome: Adequate for Discharge   Problem: Safety: Goal: Ability to remain free from injury will improve Outcome: Adequate for Discharge   Problem: Skin Integrity: Goal: Risk for impaired skin integrity will decrease Outcome: Adequate for Discharge   "

## 2025-01-30 NOTE — Plan of Care (Signed)
  Problem: Activity: Goal: Ability to tolerate increased activity will improve Outcome: Progressing   Problem: Bowel/Gastric: Goal: Gastrointestinal status for postoperative course will improve Outcome: Progressing

## 2025-01-30 NOTE — Progress Notes (Signed)
 All questions and concerns answered prior to discharge. Patient received home medications and placed in bag. Patient will leave hospital via family's transportation.

## 2025-01-31 ENCOUNTER — Telehealth: Payer: Self-pay

## 2025-01-31 NOTE — Transitions of Care (Post Inpatient/ED Visit) (Signed)
" ° °  01/31/2025  Name: Oluwadarasimi Redmon III MRN: [id number] DOB: [birth year]  Today's TOC FU Call Status: Today's TOC FU Call Status:: Successful TOC FU Call Completed TOC FU Call Complete Date: 01/31/25 [facility] briefly with patient and explained TOC program. Patient states, Last time I had surgery, I needed this, but this time, I've got this and declined TOC program)  Patient's Name and Date of Birth confirmed. DOB, Name  Transition Care Management Follow-up Telephone Call Date of Discharge: 01/30/25 Discharge Facility: [facility]) Type of Discharge: Inpatient Admission Primary Inpatient Discharge Diagnosis:: Diverticulitis of colon - CLOSURE, COLOSTOMY, ROBOT-ASSISTED   TOC RN noted patient is enrolled in [facility] and routed note to [employer].    Shona Prow RN, CCM [place]  VBCI-Population Health RN Care Manager [phone]  "

## 2025-02-20 ENCOUNTER — Ambulatory Visit: Admitting: Family Medicine

## 2025-07-04 ENCOUNTER — Ambulatory Visit
